# Patient Record
Sex: Male | Born: 1940 | Race: White | Hispanic: No | Smoking: Current every day smoker
Health system: Southern US, Community
[De-identification: ages and names within clinical notes are randomized; demographics above are authoritative.]

## PROBLEM LIST (undated history)

## (undated) DIAGNOSIS — I219 Acute myocardial infarction, unspecified: Secondary | ICD-10-CM

## (undated) DIAGNOSIS — E785 Hyperlipidemia, unspecified: Secondary | ICD-10-CM

## (undated) DIAGNOSIS — I249 Acute ischemic heart disease, unspecified: Secondary | ICD-10-CM

## (undated) DIAGNOSIS — Q600 Renal agenesis, unilateral: Secondary | ICD-10-CM

## (undated) DIAGNOSIS — Z720 Tobacco use: Secondary | ICD-10-CM

## (undated) DIAGNOSIS — K219 Gastro-esophageal reflux disease without esophagitis: Secondary | ICD-10-CM

## (undated) DIAGNOSIS — N183 Chronic kidney disease, stage 3 (moderate): Secondary | ICD-10-CM

## (undated) DIAGNOSIS — K279 Peptic ulcer, site unspecified, unspecified as acute or chronic, without hemorrhage or perforation: Secondary | ICD-10-CM

## (undated) DIAGNOSIS — I2510 Atherosclerotic heart disease of native coronary artery without angina pectoris: Secondary | ICD-10-CM

## (undated) DIAGNOSIS — I213 ST elevation (STEMI) myocardial infarction of unspecified site: Secondary | ICD-10-CM

## (undated) DIAGNOSIS — I10 Essential (primary) hypertension: Secondary | ICD-10-CM

## (undated) HISTORY — PX: CORONARY ARTERY BYPASS GRAFT: SHX141

## (undated) HISTORY — PX: OTHER SURGICAL HISTORY: SHX169

## (undated) HISTORY — DX: Peptic ulcer, site unspecified, unspecified as acute or chronic, without hemorrhage or perforation: K27.9

## (undated) HISTORY — PX: CORONARY ANGIOPLASTY WITH STENT PLACEMENT: SHX49

## (undated) HISTORY — PX: VASCULAR SURGERY: SHX849

## (undated) HISTORY — DX: Gastro-esophageal reflux disease without esophagitis: K21.9

---

## 1999-03-06 ENCOUNTER — Encounter: Payer: Self-pay | Admitting: Emergency Medicine

## 1999-03-07 ENCOUNTER — Inpatient Hospital Stay (HOSPITAL_COMMUNITY): Admission: EM | Admit: 1999-03-07 | Discharge: 1999-03-07 | Payer: Self-pay | Admitting: Emergency Medicine

## 1999-03-07 ENCOUNTER — Inpatient Hospital Stay (HOSPITAL_COMMUNITY): Admission: EM | Admit: 1999-03-07 | Discharge: 1999-03-21 | Payer: Self-pay | Admitting: Emergency Medicine

## 1999-03-07 ENCOUNTER — Encounter: Payer: Self-pay | Admitting: Cardiology

## 1999-03-08 ENCOUNTER — Encounter: Payer: Self-pay | Admitting: Cardiology

## 1999-03-08 ENCOUNTER — Encounter: Payer: Self-pay | Admitting: Emergency Medicine

## 1999-03-14 ENCOUNTER — Encounter: Payer: Self-pay | Admitting: Cardiovascular Disease

## 1999-03-16 ENCOUNTER — Encounter: Payer: Self-pay | Admitting: Cardiothoracic Surgery

## 1999-03-17 ENCOUNTER — Encounter: Payer: Self-pay | Admitting: Cardiothoracic Surgery

## 1999-03-18 ENCOUNTER — Encounter: Payer: Self-pay | Admitting: Cardiovascular Disease

## 1999-03-18 ENCOUNTER — Encounter: Payer: Self-pay | Admitting: Cardiothoracic Surgery

## 1999-07-18 ENCOUNTER — Encounter (HOSPITAL_COMMUNITY): Admission: RE | Admit: 1999-07-18 | Discharge: 1999-10-16 | Payer: Self-pay | Admitting: Cardiovascular Disease

## 1999-10-17 ENCOUNTER — Encounter (HOSPITAL_COMMUNITY): Admission: RE | Admit: 1999-10-17 | Discharge: 1999-10-18 | Payer: Self-pay | Admitting: Cardiovascular Disease

## 2004-12-18 ENCOUNTER — Encounter: Admission: RE | Admit: 2004-12-18 | Discharge: 2004-12-18 | Payer: Self-pay | Admitting: Cardiovascular Disease

## 2004-12-19 ENCOUNTER — Ambulatory Visit (HOSPITAL_COMMUNITY): Admission: RE | Admit: 2004-12-19 | Discharge: 2004-12-20 | Payer: Self-pay | Admitting: Cardiovascular Disease

## 2005-01-08 ENCOUNTER — Encounter (HOSPITAL_COMMUNITY): Admission: RE | Admit: 2005-01-08 | Discharge: 2005-04-08 | Payer: Self-pay | Admitting: Cardiovascular Disease

## 2006-06-10 ENCOUNTER — Inpatient Hospital Stay (HOSPITAL_COMMUNITY): Admission: EM | Admit: 2006-06-10 | Discharge: 2006-06-13 | Payer: Self-pay | Admitting: Emergency Medicine

## 2006-10-03 ENCOUNTER — Inpatient Hospital Stay (HOSPITAL_COMMUNITY): Admission: AD | Admit: 2006-10-03 | Discharge: 2006-10-07 | Payer: Self-pay | Admitting: Cardiology

## 2007-07-08 ENCOUNTER — Ambulatory Visit (HOSPITAL_COMMUNITY): Admission: RE | Admit: 2007-07-08 | Discharge: 2007-07-08 | Payer: Self-pay | Admitting: Cardiovascular Disease

## 2007-08-12 ENCOUNTER — Inpatient Hospital Stay (HOSPITAL_COMMUNITY): Admission: RE | Admit: 2007-08-12 | Discharge: 2007-08-13 | Payer: Self-pay | Admitting: *Deleted

## 2007-08-12 ENCOUNTER — Ambulatory Visit: Payer: Self-pay | Admitting: *Deleted

## 2007-08-12 ENCOUNTER — Encounter (INDEPENDENT_AMBULATORY_CARE_PROVIDER_SITE_OTHER): Payer: Self-pay | Admitting: *Deleted

## 2007-08-14 ENCOUNTER — Encounter: Admission: RE | Admit: 2007-08-14 | Discharge: 2007-08-14 | Payer: Self-pay | Admitting: *Deleted

## 2007-08-28 ENCOUNTER — Ambulatory Visit: Payer: Self-pay | Admitting: *Deleted

## 2008-07-27 ENCOUNTER — Inpatient Hospital Stay (HOSPITAL_COMMUNITY): Admission: EM | Admit: 2008-07-27 | Discharge: 2008-07-28 | Payer: Self-pay | Admitting: Emergency Medicine

## 2009-10-06 ENCOUNTER — Inpatient Hospital Stay (HOSPITAL_COMMUNITY): Admission: EM | Admit: 2009-10-06 | Discharge: 2009-10-07 | Payer: Self-pay | Admitting: Emergency Medicine

## 2011-01-30 LAB — BASIC METABOLIC PANEL
BUN: 16 mg/dL (ref 6–23)
BUN: 19 mg/dL (ref 6–23)
CO2: 23 mEq/L (ref 19–32)
CO2: 25 mEq/L (ref 19–32)
Calcium: 8.7 mg/dL (ref 8.4–10.5)
Creatinine, Ser: 1.56 mg/dL — ABNORMAL HIGH (ref 0.4–1.5)
GFR calc Af Amer: 54 mL/min — ABNORMAL LOW (ref >60)
GFR calc non Af Amer: 45 mL/min — ABNORMAL LOW (ref >60)
Glucose, Bld: 87 mg/dL (ref 70–99)

## 2011-01-30 LAB — COMPREHENSIVE METABOLIC PANEL
ALT: 12 U/L (ref 0–53)
Alkaline Phosphatase: 55 U/L (ref 39–117)
Chloride: 111 mEq/L (ref 96–112)
GFR calc Af Amer: >60 mL/min (ref >60)
GFR calc non Af Amer: 50 mL/min — ABNORMAL LOW (ref >60)
Sodium: 140 mEq/L (ref 135–145)

## 2011-01-30 LAB — CBC
HCT: 39.6 % (ref 39.0–52.0)
HCT: 39.9 % (ref 39.0–52.0)
HCT: 43.3 % (ref 39.0–52.0)
Hemoglobin: 14.9 g/dL (ref 13.0–17.0)
MCHC: 34.4 g/dL (ref 30.0–36.0)
MCHC: 34.5 g/dL (ref 30.0–36.0)
MCHC: 34.7 g/dL (ref 30.0–36.0)
MCV: 93 fL (ref 78.0–100.0)
Platelets: 173 10*3/uL (ref 150–400)
Platelets: 193 10*3/uL (ref 150–400)
RBC: 4.66 MIL/uL (ref 4.22–5.81)
RDW: 13.6 % (ref 11.5–15.5)
RDW: 13.6 % (ref 11.5–15.5)
WBC: 6.3 10*3/uL (ref 4.0–10.5)

## 2011-01-30 LAB — CARDIAC PANEL(CRET KIN+CKTOT+MB+TROPI)
CK, MB: 3.9 ng/mL (ref 0.3–4.0)
CK, MB: 5.4 ng/mL — ABNORMAL HIGH (ref 0.3–4.0)
Total CK: 105 U/L (ref 7–232)
Troponin I: 0.02 ng/mL (ref 0.00–0.06)

## 2011-01-30 LAB — POCT CARDIAC MARKERS
Myoglobin, poc: 190 ng/mL (ref 12–200)
Troponin i, poc: <0.05 ng/mL (ref 0.00–0.09)

## 2011-01-30 LAB — HEPARIN LEVEL (UNFRACTIONATED): Heparin Unfractionated: 0.28 IU/mL — ABNORMAL LOW (ref 0.30–0.70)

## 2011-01-30 LAB — APTT: aPTT: 28 seconds (ref 24–37)

## 2011-01-30 LAB — CK TOTAL AND CKMB (NOT AT ARMC): CK, MB: 7 ng/mL — ABNORMAL HIGH (ref 0.3–4.0)

## 2011-01-30 LAB — TROPONIN I: Troponin I: 0.02 ng/mL (ref 0.00–0.06)

## 2011-01-30 LAB — MAGNESIUM: Magnesium: 2.1 mg/dL (ref 1.5–2.5)

## 2011-01-30 LAB — LIPID PANEL
HDL: 40 mg/dL (ref >39)
LDL Cholesterol: 115 mg/dL — ABNORMAL HIGH (ref 0–99)

## 2011-01-30 LAB — DIFFERENTIAL
Basophils Absolute: 0 10*3/uL (ref 0.0–0.1)
Eosinophils Relative: 2 % (ref 0–5)
Neutrophils Relative %: 61 % (ref 43–77)

## 2011-01-30 LAB — PROTIME-INR: Prothrombin Time: 12.9 seconds (ref 11.6–15.2)

## 2011-03-13 NOTE — Op Note (Signed)
NAME:  Christopher Wilson, Christopher Wilson                ACCOUNT NO.:  [id number]   MEDICAL RECORD NO.:  [id number]          PATIENT TYPE:  INP   LOCATION:  3301                         FACILITY:  [facility] [place]   PHYSICIAN:  Dorothea Glassman, M.D.    DATE OF BIRTH:  1941/06/05   DATE OF PROCEDURE:  08/12/2007  DATE OF DISCHARGE:                               OPERATIVE REPORT   SURGEON:  Dorothea Glassman, M.D.   ASSISTANT:  RNFA.   ANESTHETIC:  General endotracheal.   ANESTHESIOLOGIST:  Dr. Glennon Mac.   PREOPERATIVE DIAGNOSIS:  Severe left internal carotid artery stenosis.   POSTOPERATIVE DIAGNOSIS:  Severe left internal carotid artery stenosis.   PROCEDURE:  Left carotid endarterectomy with Dacron patch angioplasty.   OPERATIVE PROCEDURE:  The patient was brought to the operating room in  stable condition.  Placed in supine position.  General endotracheal  anesthesia induced.  Foley catheter and arterial line in place.  Left  neck prepped and draped in a sterile fashion.   Curvilinear skin incision made along the anterior border of the left  sternomastoid muscle.  Dissection carried down through the subcutaneous  tissue with electrocautery.  Deep dissection carried through the  platysma.  The facial vein ligated with 3-0 silk and divided.  Several  branching veins were ligated off of the left internal jugular vein with  3-0 silk and divided.  The left carotid bifurcation exposed.  The common  carotid artery mobilized down to the omohyoid muscle and encircled with  a vessel loop.  The vagus nerve reflected posteriorly and preserved.  The internal carotid artery followed distally up to the posterior belly  of the digastric muscle.  The hypoglossal nerve identified, retracted  superiorly and preserved.  The distal internal carotid artery encircled  with a vessel loop.  The external carotid and superior thyroid were then  freed and encircled with vessel loops.  The patient administered 7000  units heparin  intravenously.   Gentle palpation of the carotid bifurcation revealed soft plaque  extending up into the left internal carotid artery origin.  Beyond this  the vessel was soft without plaque.   The carotid vessels controlled with clamps.  Longitudinal arteriotomy  made in the distal common carotid artery.  The arteriotomy extended  across the carotid bulb and up into the internal carotid artery.  There  was a large amount of ulcerated plaque present at the origin of the  internal carotid artery with thrombus present in the hemorrhagic plaque.  The shunt was inserted.   An endarterectomy elevator used to remove the plaque.  The  endarterectomy carried down to the common carotid artery with plaque  which was divided transversely with Potts scissors.  Plaque then raised  up into the bulb where the superior thyroid and external carotid were  endarterectomized using an eversion technique.  The distal internal  carotid artery plaque feathered out well.  Fragments of plaque removed  with fine forceps.  The site irrigated with heparin, saline and dextran  solutions.   A patch angioplasty endarterectomy site was then carried out with a  running  6-0 Prolene suture using a finesse Dacron patch.  At completion  of the patch angioplasty the shunt was removed, all vessels well  flushed.  Initial antegrade flow then directed up the external carotid  artery, internal carotid artery then released.   The patient administered 50 mg of protamine intravenously.  The patch  treated with CoSeal.   Excellent Doppler signal present in the distal internal carotid artery.   The left neck was drained with a 15 round Blake drain, exited inferiorly  through the skin, fixed to the skin with a 2-0 silk suture.   The sternomastoid fascia was then closed using running 2-0 Vicryl  suture.  Platysma closed with running 3-0 Vicryl suture.  Skin closed  with 4-0 Monocryl.  Dermabond applied.  Sterile dressing  applied.  The  patient tolerated the procedure well.  No apparent complications.  Transferred to the recovery room in stable condition.      Dorothea Glassman, M.D.  Electronically Signed     PGH/MEDQ  D:  08/12/2007  T:  08/13/2007  Job:  [id number]

## 2011-03-13 NOTE — H&P (Signed)
NAME:  Christopher Wilson, Christopher Wilson                ACCOUNT NO.:  [id number]   MEDICAL RECORD NO.:  [id number]          PATIENT TYPE:  INP   LOCATION:  2550                         FACILITY:  [place]   PHYSICIAN:  Dorothea Glassman, M.D.    DATE OF BIRTH:  [birth year]   DATE OF ADMISSION:  08/12/2007  DATE OF DISCHARGE:                              HISTORY & PHYSICAL   CARDIOLOGIST:  Richard A. Rollene Fare, M.D.   PRIMARY CARE PHYSICIAN:  Tory Emerald. Benson Norway, MD.   INDICATION FOR ADMISSION:  Severe left internal carotid artery stenosis.   HISTORY.:  [name] is a 70 year old gentleman with a long history of  atherosclerotic vascular disease.  He was recently noted to have an  abnormal Doppler evaluation.  Underwent arteriography carried out by Dr.  Gwenlyn Found indicating a severe left internal carotid artery stenosis.  He was  initially seen and evaluated for potential carotid stenting.  He,  however, was not felt to meet criteria for carotid stenting and,  therefore, is brought to the operating at this time for planned left  carotid endarterectomy.  There is no history of documented stroke.  He  denies sensory, motor or visual deficit.   PAST MEDICAL HISTORY:  1. Coronary artery disease status post remote PCI at [facility] in 1989.  He      underwent PCI in 1999, and he underwent coronary bypass procedure      in May 2002 carried out by Dr. Prescott Gum.  2. Tobacco abuse.  3. Dyslipidemia.  4. Hypertension.  5. Ischemic cardiomyopathy.   MEDICATIONS:  1. Aspirin 81 mg 2 tablets daily.  2. Plavix 75 mg daily.  3. Lisinopril 20 mg daily.  4. Lopressor 25 mg b.i.d.  5. Fish oil 1000 mg daily.  6. Ranexa 500 mg b.i.d.  7. Zocor 80 mg daily.  8. Multivitamin 1 tablet daily.   ALLERGIES:  None known.   SOCIAL HISTORY:  The patient is divorced.  He has two children.  He is  retired Haematologist truck.  Continues to smoke approximately one-half pack  of cigarettes daily.   REVIEW OF SYSTEMS:  Denies recent chest  pain.  No shortness of breath.  No syncope.  No abdominal pain.  Bowel habits regular.  No recent weight  loss.   FAMILY HISTORY:  Noncontributory.   PHYSICAL EXAMINATION:  GENERAL:  Well-appearing 70 year old gentleman.  Alert and oriented.  No acute distress.  VITAL SIGNS:  Blood pressure[ 120/80, pulse 69 per minute, respirations  18 per minute.  HEENT:  Mouth and throat clear.  Normocephalic.  Extraocular movements  intact.  NECK:  Supple.  No thyromegaly or adenopathy.  CHEST:  Equal air entry bilaterally.  No rales or rhonchi.  CARDIOVASCULAR:  Normal heart sounds.  No murmurs are gallops.  Left  carotid bruit.  ABDOMEN:  Soft, nontender.  No mass or organomegaly.  EXTREMITIES:  Lower Extremities: 2+ femoral pulses bilaterally.  No  ankle edema.  NEUROLOGIC:  Cranial nerves intact.  Strength equal bilaterally, 2+  reflexes.   IMPRESSION:  1. Severe left internal carotid artery stenosis.  2.  Coronary artery disease.  3. Hyperlipidemia.  4. Hypertension.  5. Tobacco abuse.   MEDICAL DECISION-MAKING:  The patient has a high-grade left internal  carotid artery stenosis verified by Doppler and arteriography.  Scheduled to undergo left carotid endarterectomy for reduction of stroke  risk.  Risks of the operative procedure explained to the patient in  detail with the major one being  mortality at 1-2% to include but not  limited to MI, CVA, cranial nerve injury and death.  Scheduled surgery  on September 04, 2007.      Dorothea Glassman, M.D.  Electronically Signed     PGH/MEDQ  D:  09-04-07  T:  09/04/07  Job:  [id number]   cc:   Delfino Lovett A. Rollene Fare, M.D.  Tory Emerald Benson Norway, MD

## 2011-03-13 NOTE — Cardiovascular Report (Signed)
NAME:  Christopher Wilson, Dean                ACCOUNT NO.:  [id number]   MEDICAL RECORD NO.:  [id number]          PATIENT TYPE:  AMB   LOCATION:  SDS                          FACILITY:  [place]   PHYSICIAN:  Quay Burow, M.D.   DATE OF BIRTH:  18-Sep-1941   DATE OF PROCEDURE:  07/08/2007  DATE OF DISCHARGE:  07/08/2007                            CARDIAC CATHETERIZATION   [name] is a 70 year old gentleman with a long history of ischemic  heart disease status post bypass grafting in the past by Dr. Prescott Gum  in 2002 with multiple interventions since.  He has multiple vascular  risk factors and had surveillance Dopplers recently that showed high-  grade left ICA stenosis though he was neurologically asymptomatic.  He  has mild ischemic cardiomyopathy with hyperlipidemia and ongoing tobacco  abuse.  He presents now for cerebral angiography to define his anatomy  and suitability for endarterectomy versus carotid artery stenting.   DESCRIPTION OF PROCEDURE:  The patient was brought to the second floor  [facility] angiographic suite in the postabsorptive state.  He was  not premedicated.  His right groin was prepped and shaved in the usual  sterile fashion.  Xylocaine 1% was used for local anesthesia.  A 5-  French sheath was inserted into the right femoral artery using standard  Seldinger technique.  A 5-French pigtail catheter along with the JV1  catheter were used for arch angiography, selective cerebral angiography  of all four vessels intra- and extracranially.  Visipaque dye was used  for the entirety of the case.  Aortic pressures monitored during the  case.   ANGIOGRAPHIC RESULTS:  1. Arch aortogram:  Bovine arch, type 2-3.  2. Right carotid:  40-50% mildly calcified proximal right ICA      stenosis.  The right carotid filled the anterior cerebrals      bilaterally.  3. Right vertebral:  Large dominant vessel filling the basilar and      both posterior cerebrals.  4. Left carotid:   This arose from the innominate vessel in a bovine      fashion.  There is a 95% proximal eccentric stenosis.  The left      anterior cerebral was filled by the right circulation.  5. Left vertebral:  This was nondominant.   IMPRESSION:  [name] has high-grade left internal carotid artery  stenosis with moderate right disease, and is neurologically  asymptomatic.  He is at mildly elevated risk because of his ischemic  heart disease and mild left ventricular dysfunction.  I am going to get  Dr. Drucie Opitz to see him to evaluate suitability for endarterectomy  versus carotid artery stenting.   The sheath was removed and pressure was held on the groin to achieve  hemostasis.  The patient left the lab in stable condition.  He will be  discharged home later today and will follow up with Dr. Rollene Fare.      Quay Burow, M.D.  Electronically Signed     JB/MEDQ  D:  07/08/2007  T:  07/08/2007  Job:  [id number]   cc:  2nd floor [facility] angio suite  [facility]. Rollene Fare, M.D.

## 2011-03-13 NOTE — Cardiovascular Report (Signed)
NAME:  [name], Corderro                ACCOUNT NO.:  [id number]   MEDICAL RECORD NO.:  [id number]          PATIENT TYPE:  INP   LOCATION:  2807                         FACILITY:  [place]   PHYSICIAN:  Richard A. Rollene Fare, M.D.DATE OF BIRTH:  1941/01/24   DATE OF PROCEDURE:  07/27/2008  DATE OF DISCHARGE:                            CARDIAC CATHETERIZATION   PROCEDURES:  Retrograde central aortic catheterization, selective  coronary angiography via Judkins technique, saphenous vein graft  angiography, selective left internal mammary artery angiogram, left  ventricular angiogram right anterior oblique-left anterior oblique  projection, aortic arch angiogram left anterior oblique-right anterior  oblique projection, and abdominal aortic angiogram midstream pulmonary  artery projection.   PROCEDURE IN DETAIL:  The patient was brought to second floor [facility] Lab in  a postabsorptive state after premedication with 5 mg of Valium p.o.  Right groin was prepped and draped in the usual manner.  Xylocaine 1%  was used for local anesthesia.  The CRFA was entered with a single  anterior puncture using 18 thin-walled needle and with modified  Seldinger technique a 6-French short sidearm sheath was inserted without  difficulty.  Diagnostic coronary angiography was done with 6-French 4-cm  taper preformed Cordis coronary catheters.  Saphenous vein graft  angiography to the RCA was done with the right coronary catheter.  The  left circumflex SVG was totally flush occluded on prior studies and on  this study.  The LIMA was catheterized with a 6-French LIMA catheter  subselectively with angiograms obtained.  Catheter was then changed  using guidewire exchange for a 6-French pigtail catheter which we used  for LV angiogram in RAO and LAO projection at 20 mL/14 mL per second for  each projection.  Omnipaque dye was used throughout the procedure.  Pullback pressure of the CA was performed which showed no gradient  across the aortic valve.  Aortic arch angiogram was done in the LAO and  RAO projection at 25 mL/20 mL per second.  Catheter was pulled down  above the level of the left renal artery and abdominal angiogram was  done at 25 mL/20 mL per second in the midstream PA projection.  This  demonstrated a single left renal artery and a large left kidney with  good nephrogram.  The renal artery was widely patent.  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  The infrarenal  abdominal aorta demonstrated irregularity and approximately 40%  narrowing in the mid portion of the infrarenal abdominal aorta.  The IMA  was intact.  The SMA and celiac were intact proximally.  There was  moderate nonobstructive but calcific bilateral common iliac disease with  about 60% on the right and 40% on the left.  The external iliacs had no  significant stenosis and the hypogastrics were intact bilaterally.  Hand  injection in the oblique projection of the right common femoral artery  showed good puncture into the RCFA with the profunda intact.   Catheters removed.  Side-arm sheath was flushed.  StarClose nitinol clip  device was deployed, 6-French with successful closure of the right  common femoral artery and the patient was transferred to the holding  area for postoperative care in stable condition.   Pressures:  LV:  165/8-10.  LVEDP 18-22 mmHg.   CA:  165/85 mmHg.   There is no gradient across the aortic valve on catheter pullback.   The main left coronary artery had a previously placed stent (TAXUS  stent, February 2006) that was widely patent with less than 10%  narrowing and excellent flow.   The LAD was totally occluded after a moderate-sized first diagonal  branch and septal perforator branch.   The first diagonal branch had an ostial and proximal TAXUS DES stent  placed, February 2006 that was widely patent except for about 50%  narrowing in the proximal third of the stent,  but there was good flow to  a bifurcating moderated-sized DX1.  The LAD was totally occluded after  the septal perforator in DX1 with no antegrade filling.   The circumflex was occluded after a small first marginal branch with no  antegrade filling.   The right coronary artery was totally occluded in its ostial and  proximal portion with only a faint conus branch visualized.   Saphenous vein graft to the circumflex demonstrated flush occlusion with  no filling on attempts at selective angiography and aortic root  injection.   Saphenous vein graft to the RCA was a large widely patent smooth graft  with an excellent anastomosis to the mid PDA.  Several branches were  visualized beyond the anastomosis which were widely patent and there was  excellent retrograde filling to the PLA with 2 large branches visualized  and good filling.  There was faint retrograde collaterals to small OM  branches from the distal RCA.   Aortic arch angiogram in the RAO and LAO projection showed no evidence  of aortic dissection.  No other grafts visualized except for the graft  to the right that was selectively catheterized.  Normal origin of the  great vessels from the arch.  Proximal descending aorta showed no  angiographic evidence of dissection either.   LV angiogram demonstrated hypokinesis of the mid anterolateral wall.  Hypokinesis of the basilar half of the inferior wall, akinesis of the  one-third to one-half of the inferoapical segment, and hypo-akinesis of  the anteroapical and posteroapical segments in the LAO projection.  No  significant mitral regurgitation.  EF was approximately 35-40% and  quantitative assessment is pending.   The LIMA was widely patent.  There was 70% proximal left vertebral  stenosis in the LIMA.  It was an end-to-end anastomosis to the mid-LAD  filling the LAD down to the apex.  The LAD was relatively small but good  filling regular with no high-grade stenosis beyond  the anastomosis.   DISCUSSION:  This 70 year old white divorced father of two with one  grandchild has a long history of coronary disease.  He had remote CABG  in 1989 at [facility].  He had bare-metal stenting of the RCA in 1999.  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 Tharon Aquas Trigt  successfully.  He had progression of disease and required protected left  main coronary stenting and first diagonal stenting in February 2006 with  TAXUS stents that are widely patent on this study.   He has had previous ostial and proximal TAXUS stenting of the SVG to the  circumflex by Dr. Melvern Banker, August 2007, but they were  occluded on  restudy, December 2007, probably prehospitalization accounting for a  chest pain on admission at that time.   He has done well on medical therapy.  He does continue to smoke, but he  has been compliant with his medications which include chronic Plavix and  aspirin.  He was admitted on this occasion with 1 week of episodic  interscapular discomfort, moderate episode in the a.m. of admission.  Myocardial infarction was ruled out thus far by serial enzymes and EKGs.  Angiography shows widely patent LIMA to the LAD, widely patent SVG to  the PDA with good collaterals to the PLA, and known old total occlusion  of the SVG to the circumflex.  He does have LV dysfunction which is also  known without overt CHF.   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  Etiology of the patient's chest  pain is not clear.  It maybe for medial collaterals to his circumflex if  it is indeed coronary.  There is no evidence of aortic root arch  dissection.  A GI etiology is possible as well.  I would recommend  continued medical therapy.  If he has recurrent chest pain, we can add  Ranexa to his regimen.  I would continue long-term aspirin and Plavix at   present and gave him empiric PPIs.   Also of note, is the fact that he has had asymptomatic left carotid  endarterectomy by Dr. Amedeo Plenty in October [id number] that was uncomplicated.   CATHETERIZATION DIAGNOSES:  1. Arteriosclerotic heart disease - status post coronary artery bypass      graft at [facility] in 1989.  2. Redo coronary artery bypass graft for progression of disease x3 by      Dr. Tharon Aquas Trigt in 2003.  3. Remote native right coronary artery stent in 1999, nondrug-eluting      stent.  4. Protected left main coronary drug-eluting stent and diagonal branch      1 ostial and proximal drug-eluting stent, February 2006, both      widely patent on this study by Dr. Rollene Fare.  5. Ostial and proximal saphenous vein graft to obtuse marginal TAXUS      stent x2, August 2007, Dr. Melvern Banker, occluded on repeat angiogram in      December 2007 and on current angiogram.  6. Patent saphenous vein graft to posterior descending artery with      good collaterals to posterolateral artery and some faint      collaterals to mid circumflex.  7. Patent left internal mammary artery to left anterior descending.  8. Left carotid endarterectomy for asymptomatic carotid stenosis,      October [id number], uncomplicated.  9. 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  10.Single kidney, absent right kidney, and widely patent single left      renal artery.  11.Hypertension.  12.Continued cigarette abuse.  13.Hyperlipidemia.  14.Possible gastroesophageal reflux disease and upper gastrointestinal      etiology.  15.No angiographic evidence of aortic root or arch dissection on this      study.      Richard A. Rollene Fare, M.D.  Electronically Signed     RAW/MEDQ  D:  07/27/2008  T:  07/28/2008  Job:  [id number]   cc:   CP Lab  Record Room

## 2011-03-13 NOTE — Assessment & Plan Note (Signed)
OFFICE VISIT   Christopher Wilson, Karina O  DOB:  January 01, 1941                                       08/28/2007  [id number]   [name] underwent left carotid endarterectomy for severe stenosis  August 12, 2007 at [facility].  This was carried out on Plavix  and aspirin secondary to multiple coronary stents.   He has no complaints at this time.   BP 179/95 in the left arm, 196/108 in the right arm, pulse is 18 per  minute, O2 saturation 100%.  Left neck incision healing unremarkably  with mild to moderate swelling.  No bruit audible.  Cranial nerves  intact.  Strength equal bilaterally.   [name] has done well following his left carotid endarterectomy.  We  will plan follow up with him again in 6 months with a routine carotid  Doppler evaluation.   Dorothea Glassman, M.D.  Electronically Signed   PGH/MEDQ  D:  08/28/2007  T:  08/29/2007  Job:  [id number]   cc:   Richard A. Rollene Fare, M.D.

## 2011-03-13 NOTE — Discharge Summary (Signed)
NAME:  Christopher Wilson, Christopher Wilson                ACCOUNT NO.:  [id number]   MEDICAL RECORD NO.:  [id number]          PATIENT TYPE:  INP   LOCATION:  3301                         FACILITY:  [place]   PHYSICIAN:  Dorothea Glassman, M.D.    DATE OF BIRTH:  1941/03/31   DATE OF ADMISSION:  08/12/2007  DATE OF DISCHARGE:  08/13/2007                               DISCHARGE SUMMARY   ADMISSION DIAGNOSIS:  Severe left internal carotid artery stenosis,  asymptomatic.   DISCHARGE/SECONDARY DIAGNOSES:  1. Severe left internal carotid artery stenosis, asymptomatic, status      post left carotid endarterectomy.  2. History of coronary artery disease, status post remote PCI at [facility]      in 1989, PCI in 1999 and coronary artery bypass grafting in May      2002 by Dr. Tharon Aquas Trigt.  3. Tobacco abuse.  4. Dyslipidemia.  5. Hypertension.  6. Ischemic cardiomyopathy.  7. No known drug allergies.  8. History of C. difficile colitis in 2007.   PROCEDURES:  August 12, 2007 - left carotid endarterectomy with Dacron  patch angioplasty by Dr. Gordy Clement.   BRIEF HISTORY:  [name] is a 70 year old Caucasian male with long  history of atherosclerotic vascular disease.  He was recently noted to  have an abnormal Doppler evaluation.  He underwent arteriography carried  out by Dr. Gwenlyn Found indicating severe left internal carotid artery  stenosis.  His was initially seen and evaluated for potential carotid  stenting.  He, however, was not felt to meet criteria for carotid  stenting and therefore was referred to Dr. Gordy Clement for elective  left carotid endarterectomy.  [name] had no history of TIA or  stroke.   HOSPITAL COURSE:  [name] was electively admitted to [facility] on August 12, 2007 and underwent left carotid endarterectomy.  He is felt to be neurologically intact and after short-stay in recovery  unit was transferred to step-down unit 3300 where he remained until  discharge.  A  Jackson-Pratt drain was intraoperatively, as he had been  on Plavix preoperatively.  This, however, had minimal output on  postoperative day 1 and was discontinued.  The vitals remained stable,  blood pressure 110/43.  He was in sinus rhythm in the 60s.  He was  afebrile.  Initially used supplemental oxygen but was weaned by morning.  He was able to void following Foley catheter removal.  Postoperative  labs showed a white blood count of 6.6, hemoglobin 12.7, hematocrit 37,  platelet count 164.  Sodium 137, potassium 4.1, BUN 19, creatinine 1.59  (baseline creatinine 1.4), blood glucose 121.  Physical exam showed he  was alert and oriented.  Heart was irregular rate and rhythm.  Lungs  were clear.  Abdominal exam benign.  Neurologically, he remained intact.  Tongue was midline.  He did have some soft tissue edema around his left  neck, but no evidence of hematoma and was not having problems with  swallowing or breathing.  The pain was controlled on oral medication.  His diet was advanced and he  was mobilized that morning.  By late  morning, he was felt to meet criteria for discharge home and was  discharged in stable condition.   DISCHARGE MEDICATIONS:  1. Ultram 50 mg 1 tablet p.o. q.4-6h. p.r.n. pain.  2. Aspirin 81 mg daily.  3. Plavix 75 mg daily.  4. Lisinopril 20 mg daily.  5. Lopressor 25 mg b.i.d.  6. Fish oil capsule 1000 mg daily.  7. Ranexa 500 mg b.i.d.  8. Zocor 80 mg p.o. daily.  9. Vitamin 1daily.   DISCHARGE INSTRUCTIONS:  He should continue heart healthy diet.  Increase activity slowly.  May shower starting October 16, and clean his  incision gently with soap and water.  Should call if he develops fever  greater than 101, or redness or drainage from his incision site.  He  will see Dr.  Amedeo Plenty in approximately 2 weeks.  Our office will contact  him regarding specific appointment date and time.      Jacinta Shoe, P.A.      Dorothea Glassman, M.D.   Electronically Signed    AWZ/MEDQ  D:  08/13/2007  T:  08/14/2007  Job:  [id number]   cc:   Dorothea Glassman, M.D.  Quay Burow, M.D.  Richard A. Rollene Fare, M.D.  Tory Emerald Benson Norway, MD

## 2011-03-13 NOTE — Discharge Summary (Signed)
NAME:  Christopher Wilson, Christopher Wilson                ACCOUNT NO.:  [id number]   MEDICAL RECORD NO.:  [id number]          PATIENT TYPE:  INP   LOCATION:  4703                         FACILITY:  [place]   PHYSICIAN:  Richard A. Rollene Fare, M.D.DATE OF BIRTH:  Oct 11, 1941   DATE OF ADMISSION:  07/27/2008  DATE OF DISCHARGE:  07/28/2008                               DISCHARGE SUMMARY   DISCHARGE DIAGNOSES:  1. Chest pain, no clear etiology, status post cardiac catheterization      without any obstructive disease.  He has known coronary artery      disease status post redo coronary artery bypass grafting x3 by Dr.      Tharon Aquas Trigt in 2003.  His catheterization this admission showed      his left internal mammary artery (graft) to his left anterior      descending was patent, saphenous vein graft to right coronary      artery was patent, saphenous vein graft to his circumflex was      occluded which is old.  He had collaterals from his right to his      circumflex territory, possibility that he may have some angina from      medial collaterals to the circumflex if it is coronary related at      all.  2. Coronary artery disease with coronary artery bypass grafting at      [facility] in 1989, redo in 2003, right coronary artery stent in 1999,      protected left main drug-eluting stent in diagonal branch ostial 1      and proximal drug-eluting stent February 2006, both widely patent      this cardiac catheterization.  Ostial and proximal saphenous vein      graft to his obtuse marginal Taxus stent x2 August 2007 which was      occluded on repeat angiogram in December 2007.  3. ASPVD with history of left carotid endarterectomy for asymptomatic      carotid stenosis, October 2008.  He has a 70% asymptomatic left      vertebral stenosis.  4. Congenital single kidney, absent right kidney, widely patent single      left renal artery.  5. Hypertension.  6. Tobacco abuse.  7. Hyperlipidemia.  8. Possible  gastroesophageal reflux disease.  9. Ischemic cardiomyopathy with an ejection fraction of 35-40%.   LABORATORY DATA:  Lipid profile, total cholesterol 180, triglycerides  88, HDL 37, LDL 125.  Sodium 135, potassium 4.9, chloride 107, CO2 24,  BUN 12, creatinine 1.15, hemoglobin 15.9, hematocrit 46.6, WBC 7.8 and  platelets 217.  Point-of-care marker #1, CK-MB 9.7, troponin less than  0.05.  point-of-care marker #2, CK-MB 7.9, troponin less than 0.05, CK-  MB 300/12.7, troponin 0.01, #2, 198/8.6, troponin of 0.01, #3, 151/6.6,  troponin of 0.01.  Chest x-ray, post surgical change of the chest  without evidence of acute pulmonary disease.  No change from prior chest  x-ray.   PROCEDURES:  Cardiac catheterization on July 27, 2008, by Dr.  Terance Ice with a patent LIMA and diagonal, the stent 2/06,  occluded SVG to his circumflex which is all patent, SVG to his PDA and a  patent LIMA to his LAD.  No dissection of the aorta was seen.   HOSPITAL COURSE:  [name] came to the emergency room after  experiencing some stuttering back scapular pain for 4 hours.  He did not  try nitroglycerin.  Apparently, it had been going on for approximately 1  week.  He came to the emergency room because that would not ease up.  He  then thought maybe it was his heart.  He had no diaphoresis, no  shortness of breath, no near-syncope, no lightheadedness, and no  radiation of the pain.  It was decided because of his history that he  would need to undergo cardiac catheterization.  He was seen by Dr.  Terance Ice, his primary cardiologist and he underwent cardiac  catheterization.  Findings were not anything new.  He may possibly have  some angina from some collaterals to his circumflex, although it may  also be just musculoskeletal or GI related.  His medications were  changed.  His LDL was still elevated on simvastatin 80.  He was willing  to try Crestor 20, amlodipine was also added 5 mg a  day.  He was put on  5 mg b.i.d. during this hospitalization because his lisinopril was hold,  however, this was restarted.  Thus his amlodipine was decreased to 5 mg  a day.  His blood pressure on the day of discharge was 131/69, heart  rate 59, and respirations 17.  His right groin was without any bruise.  He did have a [employer] procedure.  He was told not to sit in a tub of  water for 1 week.   DISCHARGE MEDICATIONS:  1. Lisinopril 20 mg b.i.d.  2. Aspirin 81 mg a day.  3. Nitroglycerin p.r.n.  4. Plavix 75 mg a day.  5. Fish oil as taken previously.  6. Metoprolol 25 mg twice per day.  7. Amlodipine 5 mg once a day.  8. Crestor 20 mg once a day.  9. We will also give him a call at home to take Pepcid over-the-      counter 20 mg twice a day.      Cyndia Bent, N.P.      Richard A. Rollene Fare, M.D.  Electronically Signed    BB/MEDQ  D:  07/28/2008  T:  07/29/2008  Job:  [id number]

## 2011-03-16 NOTE — Consult Note (Signed)
NAME:  Christopher Wilson, Christopher Wilson                ACCOUNT NO.:  [id number]   MEDICAL RECORD NO.:  [id number]          PATIENT TYPE:  INP   LOCATION:  2039                         FACILITY:  [place]   PHYSICIAN:  Tory Emerald. Benson Norway, MD    DATE OF BIRTH:  Jan 31, 1941   DATE OF CONSULTATION:  10/04/2006  DATE OF DISCHARGE:                                 CONSULTATION   REASON FOR CONSULTATION:  Scapular pain.   ADMITTING PHYSICIAN:  Chase Picket, M.D.   CARDIOLOGIST:  Richard A. Rollene Fare, M.D.   HISTORY OF PRESENT ILLNESS:  This is a 70 year old gentleman with a past  medical history of severe coronary artery disease status post bypass  surgery, hyperlipidemia, ischemic cardiomyopathy with an EF of 30-40%  and hypertension who is admitted to the hospital with complaints of  scapular pain.  The patient states that his symptoms started on Sunday  and he was evaluated in my office on Monday.  At that time he had not  named any complaints of any type of shortness of breath or other known  cardiac etiologies.  The patient complained of having diarrhea at that  time and he was scheduled to have a colonoscopy.  Subsequently he was  held off of Plavix after discussion with Dr. Rollene Fare in regards to the  colonoscopy which was planned for Friday.  The patient had a routine  echo evaluation on October 03, 2006 and during that time he expressed  some complaints of scapular pain.  He was subsequently sent to the  cardiology office and noted to have abnormal ST changes and subsequently  the patient was admitted for further evaluation.  The patient after  discussion again was determined that the patient had been treated with  clindamycin in October after a dental procedure.  At that time he was  also taken off of Plavix for approximately 3 days.  The patient did not  have any known complications at that time.  During the initial office  evaluation the patient had denied taking any types of antibiotics and  his  recollection for the medication was only at the time of this  admission.   PAST MEDICAL AND SURGICAL HISTORY:  As stated above.   FAMILY HISTORY:  Noncontributory.   SOCIAL HISTORY:  The patient is divorced.  He has two children and  grandchildren.  Smokes half pack per day.  No alcohol.   ALLERGIES:  NO KNOWN DRUG ALLERGIES.   MEDICATIONS:  Aspirin, Plavix, lisinopril, pravastatin, metoprolol,  multivitamin, Imdur.   REVIEW OF SYSTEMS:  Positive for the scapular pain.  No chest pain or  shortness of breath.  No dizziness, blurry vision.  No arthritis,  arthralgias.  No abdominal pain.  Positive for diarrhea.  No fevers or  chills.   PHYSICAL EXAMINATION:  VITAL SIGNS:  Stable.  GENERAL:  The patient is in no acute distress.  Alert and oriented.  HEENT:  Normocephalic, atraumatic.  Extraocular muscles intact.  Pupils  equal, round, reactive to light.  NECK:  Supple.  No lymphadenopathy.  CHEST:  Lungs are clear to auscultation bilaterally.  CARDIOVASCULAR:  Regular rate and rhythm.  ABDOMEN:  Flat, soft, nontender, nondistended.  EXTREMITIES:  No clubbing, cyanosis or edema.   LABORATORY VALUES:  White blood cell count 9.8, hemoglobin 14.3,  platelets at 306.  Sodium is 138, potassium 4.2, chloride 106, CO2 26,  BUN is 8, creatinine is 1.3, glucose is 116, AST 29, ALT 28, alk phos  75, total bili is 0.7, albumin is 3.5.   IMPRESSION:  1. Unstable angina  2. Diarrhea.  3. History ischemic cardiomyopathy.  4. Hyperlipidemia.  5. Tobacco abuse.   At this time the patient does not require colonoscopy.  His cardiac  issues are pressing at this time.  I agree with workup for C. diff  colitis and treatment if the assay is positive.  I will continue to  follow the patient at this time to determine the next course of action  from a GI standpoint.      Tory Emerald Benson Norway, MD  Electronically Signed     PDH/MEDQ  D:  10/07/2006  T:  10/07/2006  Job:  [id number]   cc:   Jeanella Craze. Little, M.D.  Richard A. Rollene Fare, M.D.

## 2011-03-16 NOTE — Cardiovascular Report (Signed)
NAME:  Christopher Wilson, Christopher Wilson                ACCOUNT NO.:  [id number]   MEDICAL RECORD NO.:  [id number]          PATIENT TYPE:  INP   LOCATION:  2039                         FACILITY:  [place]   PHYSICIAN:  Shelva Majestic, M.D.     DATE OF BIRTH:  [birth year]   DATE OF PROCEDURE:  DATE OF DISCHARGE:                            CARDIAC CATHETERIZATION   HISTORY OF PRESENT ILLNESS:  Christopher Wilson is a 70 year old  gentleman, who has known CAD, status post remote inferior wall  myocardial infarction with stenting of his RCA in 2000.  He also had  significant left main stenosis and ultimately underwent CBG re-  vascularization surgery with a LIMA to his LAD, vein graft to his  circumflex marginal vessel, and vein graft to his PDA.  He is also  status post subsequent left main cutting balloon arthrotomy/TES stenting  by Dr. Rollene Fare.  His last catheterization was done on August 2007 by  Dr. Melvern Banker, where he was found to have significant subtotal occlusion  with thrombus burden, involving the proximal portion of the vein graft  to his circumflex marginal vessel.  He had a patent LIMA to his LAD and  a patent SVG to his RCA.  He ultimately underwent stenting 2 days later  on June 12, 2006 of the SVG to the OM vessel with insertion of Taxus  stenting.  The patient had been on aspirin and Plavix.  Apparently,  subsequently he developed a significant dental abscess and apparently  stopped taking Plavix for several days.  He also was started on  clindamycin for antibiotic therapy.  Apparently, over the past month, he  has developed progressive diarrhea symptoms highly suggestive of  pseudomembranous enterocolitis.  Apparently, he was seen by GI physician  and since over the past week he apparently had been off Plavix in  anticipation of colonoscopy.  The patient had noted recurrent  interscapular pain which is his anginal equivalent.  He was seen in the  office on October 03, 2006, by Dr. Rex Kras, and  admitted to [facility].  He was started on integralin, heparin.  Troponins are  positive.  He is pain-free now and brought to the catheterization  laboratory October 04, 2006 for a definitive catheterization.   DESCRIPTION OF PROCEDURE:  After premedication with valium 5 mg, the  patient was prepped and draped in the usual fashion.  His right femoral  artery was punctured anteriorly and a 5-French sheath was inserted.  Diagnostic catheterization was done utilizing 5-French Judkins 4-left  and right coronary catheters.  A right catheter was used for angiography  into the saphenous vein to the right coronary artery.  A LIMA catheter  was used for selective angiography into the left internal mammary  artery.  In an attempt to obtain optimal seating of the vein graft to  the marginal vessel, which had a flush occlusion, also a left bypass  graft catheter was used.  A pigtail catheter was inserted for left  ventriculography.  The patient tolerated the procedure well.   HEMODYNAMIC DATA:  Central aortic pressure was 117/61.  Left  ventricular  pressure was 117/8 post A-wave 29.   ANGIOGRAPHIC DATA:  The left main was patent at site of prior stenting.  The LAD was occluded after a takeoff of a septal and diagonal vessel.   The circumflex vessel was occluded after a takeoff of the small  dimunitive marginal vessel, which was occluded.  There was a very faint  collateralization to the distal marginal vessel.   The right coronary artery was totally occluded at its origin at site of  native RCA stenting.   The vein graft, supplying the PDA, was widely patent and anastomosed  into the mid PDA.   The left internal mammary artery was widely patent and anastomosed into  the mid LAD.  There was noted to be a 70% ostial left vertebral artery  arising form the subclavian system.   There was essentially flush occlusion of the saphenous vein previously  supplying the marginal vessel.    Bi-plan sinography revealed mild LV dysfunction with focal anterolateral  hypocontractility and a mild mid inferior hypocontractility in the RAO  projection.  On the LAO projection there was a moderate region of  hypokinesis involving the mid septal wall.  There also was mild  hypocontractility involving the upper posterolateral wall.   IMPRESSION:  1. Mild LV dysfunction with anterolateral, mid septal      hypocontractility involving the upper posterolateral inferior      lateral wall.  2. Total occlusion of the LAD after the septal and diagonal vessel,      total occlusion of the circumflex vessel in the mid AV groove in OM      vessel proximally in total osteal made of RCA occlusion.  3. Widely patent saphenous vein graft supplying the mid PDA.  4. Widely patent LIMA graft supplying the LAD.  5. A flush occlusion of the vein graft previous supplying the      circumflex marginal vessel.   DISCUSSION:  [name] presents with a 1 week history of interscapular  chest pain most likely resulting from occlusion of the vein graft  supplying his marginal vessel.  The patient apparently had been off  Plavix during this time despite having had his Taxus stent inserted in  this vein graft in August 2007.  His troponin's are positive.  He  currently has been pain-free.  In light of significant thrombotic burden  to the graft with occlusion at minimum greater than 24-48 hours.  I  recommend continue medical therapy with addition of Ranexa, as well as  consideration for ECP treatment to improve collateralization.  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  We will need to  discuss with GI with reference to timing of potential colonoscopy.           ______________________________  Shelva Majestic, M.D.    TK/MEDQ  D:  10/05/2006  T:   10/06/2006  Job:  [id number]   cc:   Delfino Lovett A. Rollene Fare, M.D.  Jeanella Craze. Little, M.D.

## 2011-03-16 NOTE — Cardiovascular Report (Signed)
NAME:  [name], Christopher Wilson                ACCOUNT NO.:  [id number]   MEDICAL RECORD NO.:  [id number]          PATIENT TYPE:  INP   LOCATION:  1826                         FACILITY:  [place]   PHYSICIAN:  Bryson Dames, M.D.DATE OF BIRTH:  September 06, 1941   DATE OF PROCEDURE:  06/10/2006  DATE OF DISCHARGE:                              CARDIAC CATHETERIZATION   PROCEDURES PERFORMED:  1. Combined left heart catheterization with selective coronary angiography      of the native coronary circulation.  2. Retrograde left heart catheterization.  3. Left ventricular angiography.  4. Internal mammary artery bypass graft angiography.  5. Saphenous vein graft angiography, multiple.   COMPLICATIONS:  None.   ENTRY SITE:  Right femoral.   DYE USED:  Omnipaque.   PATIENT PROFILE:  The patient is a 70 year old white gentleman who has a  previous history of intracoronary artery stent deployment then bypass  surgery and now intermediate coronary syndrome for the last week to 10 days.  He was admitted this afternoon for his symptoms and initial troponin was  elevated at 0.21.  The patient came to the cath lab electively for cardiac  cath.   RESULTS:  The left main coronary artery was very difficult to intubate.  It  was unremarkable in appearance, though small.   The left anterior descending coronary artery contained a patent stent in the  mid portion of the vessel.  The distal runoff into the remaining LAD showed  competitive flow.   Left circumflex coronary artery is 100% occluded midway down.   Right coronary artery is 100% occluded just at the ostium with a radio-  opaque stent 100% occluded.  No antegrade flow into the distal RCA is noted.   Right coronary bypass graft is widely patent throughout and inserts into the  mid portion of the posterior descending.  The dye goes back to the  bifurcation of the RCA and there is normal retrograde filling into a large  posterolateral branch which  actually has two branches.   Saphenous vein graft to obtuse marginal branch contains subtotally occlusive  thrombus extending about 10-12 mm from the ostium.  There is an eccentric  lumen with dye filling the distal vessel and then flow is approximately TIMI  1-2.  There is a large filling defect at the ostium of this graft!   Internal mammary artery appears patent.  Distal runoff into LAD is somewhat  sluggish, but non-occlusive and the distal vessel is small.   The left subclavian appears normal.   Left ventricular angiography shows sluggish wall motion of the entire  inferior wall severely, apex mildly, anterolateral wall mildly.  Ejection  fraction estimate is 30-40%.  No mitral regurgitation is seen.  No LV  thrombus.   PLAN:  The patient will undergo anticoagulation with heparin and Integrilin  for the next 36 hours and will be brought back to the cath lab to see if  there is room for percutaneous intervention of the saphenous vein graft to  obtuse marginal which appears to be his culprit vessel to explain his  intermediate coronary syndrome.  ______________________________  Bryson Dames, M.D.     WHG/MEDQ  D:  06/10/2006  T:  06/11/2006  Job:  [id number]   cc:   Cath Lab

## 2011-03-16 NOTE — H&P (Signed)
NAME:  Christopher Wilson, Christopher Wilson                ACCOUNT NO.:  [id number]   MEDICAL RECORD NO.:  [id number]          PATIENT TYPE:  EMS   LOCATION:  MAJO                         FACILITY:  [facility]   PHYSICIAN:  Richard A. Rollene Fare, M.D.DATE OF BIRTH:  04-Jul-1941   DATE OF ADMISSION:  06/10/2006  DATE OF DISCHARGE:                                HISTORY & PHYSICAL   CHIEF COMPLAINT:  Shoulder pain and mid back pain.   HISTORY OF PRESENT ILLNESS:  Christopher Wilson is a 70 year old male followed by  Dr. Rollene Fare with a history of coronary disease.  He had bypass surgery in  May 2000.  He had a complex intervention in February 2006.  At that time, he  had a left main LAD and second diagonal intervention with Taxus stenting.  He had a patent LIMA to the LAD, patent SVG to the RCA and 40-50% SVG to the  OM1.  He has had preserved LV function.  He has had problems in the past  with noncompliance.  He has been a heavy smoker.  He has stopped several  medications in the past because of cost.  He just saw Dr. Rollene Fare in the  office June 04, 2006 and complained of some intrascapular and upper neck  discomfort with some radiation of the left arm for the last couple weeks.  Dr. Rollene Fare increased his nitrates and set him up for a Myoview study.  This morning he woke at 2:00 a.m. with increasing intrascapular pain  associated with some mild diaphoresis.  He is admitted to the emergency  room, started on heparin and nitrates with relief of his symptoms.  His  troponins are elevated with a peak troponin of 0.23.  He is currently pain  free.   PAST MEDICAL HISTORY:  Remarkable for remote PCI at [facility] in 1989 and again  at [facility] in 1999.  He underwent bypass surgery as noted Mar 15, 1999 by Dr.  Prescott Gum.  He has dyslipidemia and smoking.   CURRENT MEDICATIONS:  1. Atenolol 25 mg a day.  2. Imdur 30 mg b.i.d.  3. Aspirin 81 mg a day.   ALLERGIES:  He has no known drug allergies, he has not necessarily been  intolerant to statins in the past it is just that he has not been able to  afford medications.  He has also been on Plavix in the past but stopped this  because of excessive bleeding according to the patient.   SOCIAL HISTORY:  He is divorced, he has two children.  He is a retired Water engineer.  He continues to smoke about a pack a day.   FAMILY HISTORY:  Remarkable in that his father died at 23 of an MI.   REVIEW OF SYSTEMS:  Essentially unremarkable except for noted above.  He has  had no GI bleeding.  He had carotid Dopplers for some bruits in 2000 that  were essentially normal.   PHYSICAL EXAM IN THE EMERGENCY:  VITAL SIGNS:  Blood pressure 113/77, pulse  56, O2 sat 96 on room air, respirations 12.  GENERAL:  He is a well-developed, well-nourished male in no acute distress.  HEENT:  Normocephalic. Extraocular  movements are intact. Sclera is  nonicteric.  NECK:  Without JVD and without bruit.  CHEST:  Clear to auscultation and percussion.  CARDIAC:  Reveals regular rate and rhythm without murmur, rub or gallop.  Normal S1, S2.  ABDOMEN:  Nontender.  No hepatosplenomegaly. She does have a ventral hernia.  EXTREMITIES:  Without edema.  Distal pulses are intact. There are no femoral  bruits noted.  NEURO:  Exam is grossly intact.  He is awake, alert, oriented, and  cooperative and moves all extremities without obvious deficit.  SKIN:  Warm and dry.   LABORATORY DATA:  White count 6.9, hemoglobin 14.3, hematocrit 40.5,  platelets 238.  Sodium 138, potassium 4.5, BUN 18, creatinine 1.4.  Troponin  peaked at 0.23.  EKG showed sinus rhythm without acute changes.   IMPRESSION:  1. Unstable angina.  2. Known coronary disease with remote PCIs and bypass surgery in May 2000      with subsequent left main LAD and second diagonal Taxus stenting in      February 2006.  3. Treated hypertension.  4. Long history of smoking.  5. Untreated dyslipidemia.  6. Noncompliance  secondary to financial reasons.  7. Strong family history of coronary disease.   PLAN:  The patient will be admitted to telemetry.  He will be continued on  heparin and nitrates.  He will need a diagnostic catheterization, possibly  today although unfortunately, the patient's daughter brought him an egg  salad sandwich which he had just finished when I went to see him.      Erlene Quan, P.A.      Richard A. Rollene Fare, M.D.  Electronically Signed    LKK/MEDQ  D:  06/10/2006  T:  06/10/2006  Job:  [id number]

## 2011-03-16 NOTE — Cardiovascular Report (Signed)
NAME:  Christopher Wilson, Christopher Wilson                ACCOUNT NO.:  [id number]   MEDICAL RECORD NO.:  [id number]          PATIENT TYPE:  OIB   LOCATION:  2899                         FACILITY:  [facility]   PHYSICIAN:  Richard A. Rollene Fare, M.D.DATE OF BIRTH:  Mar 20, 1941   DATE OF PROCEDURE:  12/19/2004  DATE OF DISCHARGE:                              CARDIAC CATHETERIZATION   PROCEDURES:  1.  Retrograde central aortic catheterization.  2.  Selective coronary angiography by Judkins technique.  3.  Pre and post intracoronary nitroglycerin administration.  4.  Left ventricular angiogram in RAO and LAO position.  5.  Saphenous vein graft angiography.  6.  Left internal mammary artery angiography.  7.  Abdominal aortic angiogram in midstream PA projection.  8.  Aggrastat double-bolus infusion.  9.  Plavix 600 mg p.o.  10. Weight-adjusted heparin 5500 units.  11. Monitored ACT total.  12. Balloon dilatation of subtotal left main stenosis with jeopardized DX1      and DX2.  77. Cutting balloon atherectomy of mid DX2.  14. Left anterior descending __________, main left.  46. DES stenting of mid DX2 stent to LAD.  18. DES stenting to main left coronary artery.   BRIEF HISTORY:  [name] is a 70 year old, divorced, white father of two  with one grandchild.  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  He has a long history of  coronary disease and a family history of coronary disease.  He is a smoker  and still smokes one to one-and-a-half packs a day.  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  He has  hyperlipidemia and the patient has not been compliant and essentially  declined treatment in the past for severe elevated cholesterol of 300 and  HDL over 200.   He underwent initial PTCA at [facility] in 1989 and subsequent PTCA at [facility] by me  in 1999.   He subsequently suffered an acute DMI on Mar 15, 1999, with RV infarction  and required RCA ostial and proximal bare metal stents.  He had IABP placed.  He was studied later in the hospitalization and because of significant main  left coronary disease subsequently underwent elective CABG x 3 by Dr. Prescott Gum on Mar 15, 1999, with LIMA to LAD, SVG to OM and SVG to RCA.  He has  been followed intermittently in the past, but has not been seen since  December of 2004.  Statin medication was recommended and prescribed, but the  patient did not take this.  He has done well until the last month when he  had recurrent typical angina.  He was seen by Dr. Rex Kras in the office on  December 18, 2004, and set up for catheterization today.  Imdur was added to  his regimen, which was aspirin and beta blocker.   DESCRIPTION OF PROCEDURE:  The patient was brought to the second floor CP  laboratory in a post absorptive state after 5 mg of Valium p.o.  premedication.  Baseline BUN and creatinine were  17/1.2, cholesterol 300 and  LDL 222.  The right groin was prepped and draped in the usual manner.  One  percent Xylocaine was used for local anesthesia.  The RCFA was entered with  a single anterior puncture using an 18 thin wall needle and a 6 French short  Diag sidearm sheath was inserted without difficulty.  Diagnostic coronary  angiography, saphenous vein graft angiography and LV angiogram, RAO and LAO,  and abdominal angiogram were done with preformed 6 [place] Cordis 4 cm  tapered coronary catheters and pigtail catheter.  Omnipaque dye was used  throughout the procedure.  Saphenous vein graft angiography was done in the  right coronary artery.  A Sci-Med 6 French LIMA catheter was used for  selective IMA.  Abdominal angiography was done in the PA projection using 25  mL at 20 mL/sec.  LV angiogram was done at 25 mL at 14 mL/sec RAO and 20 mL  at 12 mL/sec LAO projection.  Pullback pressure of the CA was performed.  It  showed no gradient across the aortic valve.   The patient  tolerated the diagnostic procedure well.   Left ventricle:  130/0; LVEDP 18-24 mmHg.   CA:  130/80 mmHg.   There was no gradient across the aortic valve on catheter pullback.   LV angiogram demonstrated hypo-akinesis of the basilar third of the inferior  wall, akinesis of the mid inferior wall and hypo-akinesis of the distal  third of the inferior wall.  There was mild hypokinesis of the mid  anterolateral wall and the posterior apical segment.  The EF was  approximately 50% with no MR.  Abdominal angiogram revealed patent left  renal artery, congenitally absent right renal artery and a large left kidney  with good nephrogram.  The infrarenal abdominal aorta had moderately severe  ulcerative atherosclerosis without aneurysm or stenosis.  The proximal  iliacs were intact.  The proximal SMA and celiac were intact.   The main left coronary artery had 95% ostial proximal and segmental stenosis  with 90% of the distal third.   The circumflex artery was totally occluded after a small OM1 branch in its  proximal third with no antegrade filling.   The LAD was totally occluded after a moderately large DX2 branch and septal  perforator.  The septal perforator was moderately large.  There was a first  diagonal from the proximal LAD that had irregularities with about 50%  stenosis and no significant stenosis followed by an SP1 that had 90%  stenosis.  The SP2 was at across the origin from the DX2.  The LAD was  totally occluded at the [facility] with no antegrade filling.   The DX2 had 85% stenosis in the proximal mid third.  There was 90% stenosis  of the LAD-DX2 origin.  There was another 30% eccentric stenosis of the  proximal LAD-left main origin.   The right coronary was totally occluded after a 99% proximal lesion and an  atrial branch.  No antegrade filling.  No antegrade filling.  The saphenous vein graft to the RCA was widely patent with excellent  anastomosis to the PDA.  There was  retrograde filling of the distal RCA and  the PLA.  The inferior PLA bifurcation branch had 85% stenosis.  The  remainder of the supplied vessels had no significant stenosis.   The saphenous vein graft to the mid circumflex marginal branch showed an  excellent anastomosis with good filling of the trifurcation beyond the  anastomosis.  There was retrograde stenosis of 95% of a distal circumflex  branch.   The graft itself had 40-50% smooth eccentric narrowing in its proximal  portion just beyond the ostia and another 30-40% smooth narrowing in the mid  portion.  There was good flow throughout.   The LIMA had an excellent anastomosis to the mid LAD and filled the LAD down  to the apex.  There was approximately 50% narrowing of the mid LAD and there  was a relatively thin LAD, but good flow.   This patient has typical recurrent angina and has complex anatomy as  outlined above.  He has high-grade left main stenosis with jeopardized DX1  and DX2 and is a candidate for culprit lesion intervention in view of his  significant symptoms.  He will also need aggressive ancillary medical  therapy and discontinuation of smoking along with lipid-lowering therapy.   Informed consent was obtained to proceed.  The patient was given weight-  adjusted heparin, monitoring ACTs, a total of 6500 units during the  procedure, 600 mg of Plavix and double-bolus Aggrastat plus infusion.  IC  nitroglycerin was used intermittently and he was given 4 mg of Versed for  sedation during the procedure and 20 mg of __________.   The left main was intubated with a JL4 guiding catheter.  It was difficult  to maintain this coaxially.  Initially a Asahi light wire was used to enter  the diagonal.  The left main was dilated with a 1.5/15 Maverick balloon at  10-30 and 12-40.  The wire had then prolapsed back.  The guiding catheter  was then changed to a CLS 3.5 6 [place] guide and the lesion required a  Choice PT2LS wire  to negotiate the left main and the second diagonal.  The  wire was free in the distal second diagonal and the 1.5 balloon was used to  dilate the diagonal LAD and the mid diagonal at 10-25 and 10-25.  It was  then used to redilate the left main at 12-40.   This was exchanged for a 2.0/10 Sci-Med cutting balloon which was used to  dilate the left main at 6-30, 7-30 and 7-40.  The balloon was then used to  dilate the LAD diagonal origin at 8-35 and the proximal-mid diagonal at 7-  25.   The balloon was then upgrade to a 2.5/10 cutting balloon and the left main  was redilated at 6-45, 7-40 and 7-35.   We then exchanged for a Taxus Express II 2.5/20 DES stent.  This was  positioned in the proximal-mid diagonal and covered the diagonal and the LAD  diagonal origin across the SP2.  It was deployed at 12-35, post dilated at  12-30 with good angiographic result.  The left main was then stented with a 3.0/12 Taxus Express II DES stent.  It  was carefully positioned fluoroscopically to cover the ostium and protrude  about 1-2 mm beyond the ostia, deployed at 14-25.  Post dilated at 18-40 and  the ostium post dilated at 18-30 to flare the stent.  The balloon was pulled  back after IC nitroglycerin final injections were obtained.   This showed excellent angiographic result with main left stenosis reduced  from 95% to 0%.  The mid diagonal was reduced from 85% to 0% and the LAD  diagonal origin with the long stent was reduced from 90% to 0%.  There was  good TIMI-3 flow to the second diagonal and first diagonal in the small  remnant of the circumflex.  The patient tolerated the procedure well.  The  dilatation system was removed.  The sidearm sheath was flushed and secured  to the skin.  The patient was brought to the holding area for postoperative  care in stable condition.   CATHETER DIAGNOSES:  1.  Atherosclerotic heart disease, status post right coronary percutaneous      coronary  intervention in 1989 at [facility] and repeat percutaneous coronary      intervention at [facility] in 1999.  2.  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 Melvern Banker.  3.  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  4.  Status post elective coronary artery bypass graft x 3 on Mar 14, 2005,      by Dr. Prescott Gum.  5.  Recurrent angina with anatomy as outlined above.  6.  Successful main left cutting balloon atherectomy and subsequent DES      stenting.  7.  Successful proximal-mid diagonal and left anterior descending diagonal      ostia cutting balloon atherectomy and DES stenting.  8.  Left ventricular dysfunction, ejection fraction of approximately 50%,      inferior wall motion abnormality.  9.  Continued cigarette abuse.  10. Hyperlipidemia.  The patient declined statins in the past.  11. Systemic hypertension.  12. Single left kidney, congenital, with normal renal artery.  13. Severe hyperlipidemia.      RAW/MEDQ  D:  12/19/2004  T:  12/19/2004  Job:  [id number]   cc:   CP Laboratory   Jeanella Craze. Little, M.D.  [address]  [place] [address]  Fax: [phone]

## 2011-03-16 NOTE — Discharge Summary (Signed)
NAME:  Christopher Wilson, Christopher Wilson                ACCOUNT NO.:  [id number]   MEDICAL RECORD NO.:  [id number]          PATIENT TYPE:  INP   LOCATION:  [id number]                         FACILITY:  [facility]   PHYSICIAN:  Bryson Dames, M.D.DATE OF BIRTH:  [birth year]   DATE OF ADMISSION:  06/10/2006  DATE OF DISCHARGE:  06/13/2006                                 DISCHARGE SUMMARY   Mr. Beck Christopher Wilson is a 70 year old male patient with a history of coronary  artery disease.  He had a remote PCI done in 1989 to 1999.  He had a CABG x3  in May of 2000.  He had a LIMA to his LAD and diagonal.  He had TAXUS PCI,  February of 2006.  He had a Cardiolite that was negative in June of 2006.  His last office visit was June 04, 2006.  He had been having increasing  scapular, nitrates were increased and a Cardiolite was scheduled.  This a.m.  he had awakened with chest pain at 2 a.m.  He was started on IV heparin and  nitroglycerin and he was pain free in the emergency room.  His troponin was  elevated at 0.23, thus he was admitted to the hospital and taken to the cath  lab.  His cath showed his LIMA to his LAD was okay and his SVG to his OM1  circumflex had a subtotal occlusion at the ostial portion of the graft.  He  underwent heparin and Integrilin and Dr. Melvern Banker decided to restudy him in 36  hours.  His SVG to his RCA was okay and he had severe native 3-vessel native  disease.  He had no distal disease past his graft.  His EF was 30% to 40%.  He was restudied on June 12, 2006 by Dr. Melvern Banker and he underwent TAXUS  stenting.  He had a stent to his proximal SVG reduced from 95 to 0, a stent  to his mid SVG.  He had a dissection and a stent to his distal SVG with a  dissection.  He had a PTCA to his very distal SVG with a dissection.  He had  an Angio-Seal.  These stents were all overlapping and they were all TAXUS  stents.  The following day, his blood pressure is 120/63.  He heart rate was  59.  His respirations were  20.  His CK-MB was 85/3.9.  On June 13, 2006,  he was considered stable.  He does have a history of noncompliance secondary  to financial costs of his medications.  Thus, we set him up with our office  to register with some of the drug companies for their indigent drug  programs.   LABS:  His hemoglobin was 13.1, hematocrit was 38.1, WBCs were 5.9,  platelets were 192.  His sodium was 137, potassium was 3.7, his chloride of  108, CO2 was 20, glucose was 104, BUN was 19, creatinine was 1.4.  His AST  was 25, ALT was 17.  CK-MB, number 1, 144/9.0, troponin 0.37.  Number 2  103/5.6, troponin was 0.61.  number 3 100/4.7, a  troponin of 0.61.  Post  procedure, CK 85, MB 3.9.  Total cholesterol is 310, triglycerides were 138,  HDL was 37 and his LDL was 245.  His TSH was 0.815.  His urine was negative.  I do not see a chest x-ray in the chart at the time of this dictation.   DISCHARGE MEDICATIONS:  Are:  1. Aspirin 81 mg one time per day.  2. Metoprolol 25 mg two times per day.  3. Pravastatin 40 mg two times per day.  4. Lisinopril 10 mg one time per day.  5. Plavix 75 mg one time per day, he is not to stop it.  6. Zantac 300 mg one time per day.  7. Nitroglycerin 150 one under his tongue every 5 minutes x3 for chest      pain.   He has an appointment has at [facility], June 17, 2006 at 9 a.m.  He  will see Dr. Rollene Fare in office in 2 weeks, our office will call.  He  should go to our office and bring in records of his last year's income to  qualify for the Plavix program to get his meds for free.  His was given  trial cards on the day of discharge.   DISCHARGE DIAGNOSES:  1. Acute coronary syndrome.  2. Progressive coronary artery disease with thrombus in his saphenous vein      graft to his obtuse marginal with Integrilin infusion for 36 hours then      TAXUS stenting with 3 overlapping stents in the graft.  3. History of coronary artery bypass graft of the LIMA to his left       anterior descending and saphenous vein graft to his right coronary      artery and a saphenous vein graft to his obtuse marginal.  4. Ischemic cardiomyopathy with an ejection fraction of 30% to 40%.  5. Hyperlipidemia with an LDL of 245.  6. Tobacco use.  7. History of noncompliance with meds secondary to money issues.      Cyndia Bent, N.P.    ______________________________  Bryson Dames, M.D.    BB/MEDQ  D:  07/21/2006  T:  07/22/2006  Job:  [id number]   cc:   Delfino Lovett A. Rollene Fare, M.D.

## 2011-03-16 NOTE — Discharge Summary (Signed)
NAME:  Christopher Wilson, Christopher Wilson                ACCOUNT NO.:  [id number]   MEDICAL RECORD NO.:  [id number]          PATIENT TYPE:  OIB   LOCATION:  6529                         FACILITY:  [facility]   PHYSICIAN:  Richard A. Rollene Fare, M.D.DATE OF BIRTH:  April 15, 1941   DATE OF ADMISSION:  12/19/2004  DATE OF DISCHARGE:  12/20/2004                                 DISCHARGE SUMMARY   DISCHARGE DIAGNOSIS:  1.  Unstable angina.  2.  Coronary disease, coronary artery bypass grafting May 2000.  3.  Dyslipidemia.  4.  History of smoking.  5.  Treated hypertension.   HOSPITAL COURSE:  The patient is a 70 year old male followed by Dr.  Rollene Fare who comes into the office December 18, 2004,  with chest pain  consistent with unstable angina. He has long history of coronary disease. He  had an angioplasty in 1999 at [facility].  He also had angioplasty in 1989 by Dr.  Rollene Fare. He had an MI in May 2000, and subsequently underwent stenting and  eventual bypass surgery Mar 13, 1999. He was seen in the office December 18, 2004, with chest pain. He had had two  episodes of chest pain that were  nitrite responsive. He was set up for diagnostic catheterization. He is  admitted December 19, 2004, for elective catheterization. This was done by  Dr. Rollene Fare and revealed a patent SVG to the RCA with some distal 85% PLA  disease, patent LIMA to the LAD with distal 50% LAD narrowing, patent SVG to  the OM with 95% side branch stenosis and a 95% proximal LAD and mid LAD. The  LAD was occluded after the second diagonal. The patient underwent PCI and  stenting to the proximal LAD lesion. The native circumflex was totaled at  mid vessel. He also underwent angioplasty to the mid LAD lesion.  He  tolerated procedure well. He was put on Aggrastat. He was seen by smoking  cessation during this hospitalization. We feel he can be discharged on  December 20, 2004.  Dr. Rollene Fare has added Vytorin.   DISCHARGE LABORATORY DATA:  Sodium  137, potassium 3.9, BUN 18, creatinine  1.6.  Troponin slightly positive at 0.14,  CK of 136, CK-MB 3.7.  Uric acid  was 7.3   Chest x-ray:  No acute changes.   Hematology shows a white count of 8.0, hemoglobin 13.5,  hematocrit 37.8,  platelets 242.   DISCHARGE MEDICATIONS:  1.  Plavix 75 mg a day.  2.  Coated aspirin daily.  3.  Vytorin 1040 daily.  4.  Atenolol 25 mg a day.  5.  Wellbutrin 150 b.i.d.  6.  Nitroglycerin sublingual p.r.n..   He will follow up with Dr. Rollene Fare in a couple of weeks in the office. He  had been on Imdur but the patient wanted to try and see if he could go  without this at this point. He knows he can resume it if he has recurrent  chest pain and that he does have some distal vessel disease.      LKK/MEDQ  D:  12/20/2004  T:  12/20/2004  Job:  [id number]

## 2011-03-16 NOTE — Discharge Summary (Signed)
NAME:  Christopher Wilson, Vonnie                ACCOUNT NO.:  [id number]   MEDICAL RECORD NO.:  [id number]          PATIENT TYPE:  INP   LOCATION:  2039                         FACILITY:  [facility]   PHYSICIAN:  York Grice, P.A. DATE OF BIRTH:  1941-01-28   DATE OF ADMISSION:  10/03/2006  DATE OF DISCHARGE:  10/07/2006                               DISCHARGE SUMMARY   DISCHARGE DIAGNOSIS:  1. Unstable angina pectoris status post catheterization during this      admission, no intervention.  Continue Plavix and aspirin and Ranexa      added.  2. Clostridium difficile colitis after antibiotic therapy of his      infection, now on Flagyl therapy.  3. Known coronary artery disease status post coronary artery bypass      grafting in 2000.  4. Ischemic cardiomyopathy with ejection fraction 30% to 40%.  5. Hyperlipidemia.   HISTORY OF PRESENT ILLNESS/HOSPITAL COURSE:  The patient is a 70-year-  old Caucasian gentleman, patient of  Dr. Rollene Fare, who was admitted to  the [facility] with complaints of chest pain.  He recently  underwent Taxus stenting of the SVG to RCA.  The procedure was performed  by Dr. Melvern Banker.  It was very complex, and the patient was discharged home  on Plavix therapy and aspirin.  He developed a chest abscess and stopped  taking Plavix for several days.  Then, he was treated with clindamycin  and as a consequence developed bloody diarrhea.  The patient was  scheduled for a colonoscopy, and on Monday, September 30, 2006, stopped  taking Plavix again.  He developed an interscapular pain on October 03, 2006 and came to the office.  Dr. Rex Kras saw him and admitted him to the  hospital on rule out MI protocol and for catheterization.   During this hospitalization, his cardiac enzymes were positive first set  revealed CK 176, CK-MB 12.0, troponin 1.61.  Second set was CK 151, CK-  MB 10, troponin 2.11.  The third set revealed CK 119, MB 8.1, and  troponin 1.50.  Actually, the  patient suffered myocardial damage;  although, his EKGs did not reveal any significant changes.   On October 06, 2006, he underwent coronary angiography performed by Dr.  Claiborne Billings.  Tests revealed total occlusion of the SVG to the circumflex  marginal vessel where the Taxus stent was inserted in August.  Because  the patient had been free of pain and in light of significant thrombotic  burden to the graft, continued medical therapy was recommended with the  addition of Ranexa as well as consideration for ECP treatment to improve  collateralization.   We requested the gastroenterologist consultation and Dr. Amedeo Plenty saw  patient.  The Clostridium difficile was ordered and revealed a positive  result, so Flagyl therapy was initiated.   Dr. Claiborne Billings had seen the patient on October 03, 2006, the day of his  discharge, and considered him to be stable.   DISCHARGE DIAGNOSES:  1. Status post nonST elevation myocardial infarction - status post      catheterization revealing occluded saphenous vein  graft to the      circumflex.  Discharged on aspirin and Plavix.  2. Known coronary artery disease status post coronary artery bypass      graft in 2000.  3. Ischemic cardiomyopathy with ejection fraction 30 to 40%.  4. Hyperlipidemia.  5. Clostridium difficile colitis on Flagyl therapy.   DISCHARGE MEDICATIONS:  1. Ranexa 500 mg b.i.d.  2. Aspirin 81 mg daily.  3. Lopressor 50 mg b.i.d.  4. Pravastatin 40 mg b.i.d.  5. Plavix 75 mg daily.  6. Fish oil 1000 mg daily.  7. Multivitamins daily.  8. Nitroglycerin sublingual as directed.   DISCHARGE DIET:  Low-salt, low-fat, low-cholesterol diet.   He was not allowed to drive for a couple of days after discharge.   The patient was instructed to report any problems with groin puncture  site, and he will be seen by Dr. Rollene Fare in 2-3 weeks, and our office  will contact the patient to schedule a followup appointment.      York Grice,  P.A.     MK/MEDQ  D:  10/07/2006  T:  10/08/2006  Job:  [id number]   cc:   Delfino Lovett A. Rollene Fare, M.D.  Tory Emerald Benson Norway, MD

## 2011-07-30 LAB — BASIC METABOLIC PANEL
BUN: 12
Chloride: 107
GFR calc Af Amer: >60
GFR calc non Af Amer: >60
Potassium: 4.9

## 2011-07-30 LAB — CBC
HCT: 46.6
MCHC: 33.8
MCV: 92.7
MCV: 92.9
Platelets: 211
Platelets: 217
RBC: 4.52
RBC: 5.01
WBC: 7.8

## 2011-07-30 LAB — APTT: aPTT: 26

## 2011-07-30 LAB — POCT CARDIAC MARKERS
CKMB, poc: 7.9
Myoglobin, poc: 174
Myoglobin, poc: 289
Troponin i, poc: <0.05

## 2011-07-30 LAB — POCT I-STAT, CHEM 8
Calcium, Ion: 1.21
Chloride: 111
Glucose, Bld: 111 — ABNORMAL HIGH
HCT: 41
Hemoglobin: 13.9
TCO2: 23

## 2011-07-30 LAB — DIFFERENTIAL
Basophils Absolute: 0
Basophils Relative: 1
Eosinophils Absolute: 0.2
Monocytes Relative: 10
Neutro Abs: 3.5
Neutrophils Relative %: 60

## 2011-07-30 LAB — CARDIAC PANEL(CRET KIN+CKTOT+MB+TROPI)
CK, MB: 6.6 — ABNORMAL HIGH
Relative Index: 4.3 — ABNORMAL HIGH
Relative Index: 4.4 — ABNORMAL HIGH
Troponin I: 0.01

## 2011-07-30 LAB — PROTIME-INR
INR: 0.9
Prothrombin Time: 12.3

## 2011-07-30 LAB — HEPARIN LEVEL (UNFRACTIONATED): Heparin Unfractionated: 0.42

## 2011-07-30 LAB — LIPID PANEL
Cholesterol: 180
HDL: 37 — ABNORMAL LOW
Total CHOL/HDL Ratio: 4.9
Triglycerides: 88

## 2011-07-30 LAB — TROPONIN I: Troponin I: 0.01

## 2011-08-08 LAB — BASIC METABOLIC PANEL
CO2: 25
Calcium: 8.5
Creatinine, Ser: 1.59 — ABNORMAL HIGH
GFR calc Af Amer: 53 — ABNORMAL LOW
GFR calc non Af Amer: 44 — ABNORMAL LOW
Glucose, Bld: 121 — ABNORMAL HIGH

## 2011-08-08 LAB — CBC
MCHC: 34.4
Platelets: 164
RDW: 13.8

## 2011-08-09 LAB — COMPREHENSIVE METABOLIC PANEL
ALT: 17
AST: 25
Alkaline Phosphatase: 64
CO2: 24
Calcium: 9.5
Chloride: 106
GFR calc Af Amer: >60
GFR calc non Af Amer: 51 — ABNORMAL LOW
Potassium: 4.5
Sodium: 135
Total Bilirubin: 0.9

## 2011-08-09 LAB — CBC
Hemoglobin: 15.3
RBC: 4.7
WBC: 7.2

## 2011-08-09 LAB — URINALYSIS, ROUTINE W REFLEX MICROSCOPIC
Bilirubin Urine: NEGATIVE
Glucose, UA: NEGATIVE
Hgb urine dipstick: NEGATIVE
Protein, ur: NEGATIVE
Specific Gravity, Urine: 1.011
Urobilinogen, UA: 0.2

## 2011-08-09 LAB — URINE MICROSCOPIC-ADD ON

## 2011-08-09 LAB — TYPE AND SCREEN: Antibody Screen: NEGATIVE

## 2011-08-09 LAB — ABO/RH: ABO/RH(D): A POS

## 2011-08-10 LAB — CBC
HCT: 41.7
Platelets: 225
RDW: 13.2
WBC: 7.2

## 2011-08-10 LAB — PROTIME-INR
INR: 0.9
Prothrombin Time: 12.7

## 2011-08-10 LAB — BASIC METABOLIC PANEL
BUN: 22
Calcium: 9.4
GFR calc non Af Amer: 47 — ABNORMAL LOW
Glucose, Bld: 98
Potassium: 4.2

## 2011-08-10 LAB — APTT: aPTT: 29

## 2011-08-22 ENCOUNTER — Emergency Department (HOSPITAL_COMMUNITY)
Admission: EM | Admit: 2011-08-22 | Discharge: 2011-08-22 | Disposition: A | Discharge status: Home / Self Care | Payer: Medicare PPO | Attending: Emergency Medicine | Admitting: Emergency Medicine

## 2011-08-22 ENCOUNTER — Emergency Department (HOSPITAL_COMMUNITY): Payer: Medicare PPO

## 2011-08-22 DIAGNOSIS — I252 Old myocardial infarction: Secondary | ICD-10-CM | POA: Insufficient documentation

## 2011-08-22 DIAGNOSIS — R079 Chest pain, unspecified: Secondary | ICD-10-CM | POA: Insufficient documentation

## 2011-08-22 DIAGNOSIS — R1013 Epigastric pain: Secondary | ICD-10-CM | POA: Insufficient documentation

## 2011-08-22 DIAGNOSIS — Z7982 Long term (current) use of aspirin: Secondary | ICD-10-CM | POA: Insufficient documentation

## 2011-08-22 DIAGNOSIS — Z79899 Other long term (current) drug therapy: Secondary | ICD-10-CM | POA: Insufficient documentation

## 2011-08-22 DIAGNOSIS — I2510 Atherosclerotic heart disease of native coronary artery without angina pectoris: Secondary | ICD-10-CM | POA: Insufficient documentation

## 2011-08-22 LAB — DIFFERENTIAL
Basophils Absolute: 0 10*3/uL (ref 0.0–0.1)
Basophils Relative: 1 % (ref 0–1)
Eosinophils Absolute: 0.3 10*3/uL (ref 0.0–0.7)
Eosinophils Relative: 4 % (ref 0–5)
Neutrophils Relative %: 64 % (ref 43–77)

## 2011-08-22 LAB — BASIC METABOLIC PANEL
Calcium: 9.9 mg/dL (ref 8.4–10.5)
GFR calc Af Amer: 51 mL/min — ABNORMAL LOW (ref >90)
GFR calc non Af Amer: 44 mL/min — ABNORMAL LOW (ref >90)
Glucose, Bld: 101 mg/dL — ABNORMAL HIGH (ref 70–99)
Potassium: 4.7 mEq/L (ref 3.5–5.1)
Sodium: 138 mEq/L (ref 135–145)

## 2011-08-22 LAB — CBC
MCV: 91 fL (ref 78.0–100.0)
Platelets: 226 10*3/uL (ref 150–400)
RBC: 4 MIL/uL — ABNORMAL LOW (ref 4.22–5.81)
RDW: 13 % (ref 11.5–15.5)
WBC: 6.3 10*3/uL (ref 4.0–10.5)

## 2011-08-22 LAB — POCT I-STAT TROPONIN I: Troponin i, poc: 0 ng/mL (ref 0.00–0.08)

## 2012-06-26 ENCOUNTER — Inpatient Hospital Stay (HOSPITAL_COMMUNITY)
Admission: EM | Admit: 2012-06-26 | Discharge: 2012-06-29 | DRG: 287 | Disposition: A | Discharge status: Home / Self Care | Payer: Medicare Other | Attending: Cardiovascular Disease | Admitting: Cardiovascular Disease

## 2012-06-26 ENCOUNTER — Encounter (HOSPITAL_COMMUNITY): Payer: Self-pay | Admitting: *Deleted

## 2012-06-26 DIAGNOSIS — Z79899 Other long term (current) drug therapy: Secondary | ICD-10-CM

## 2012-06-26 DIAGNOSIS — I252 Old myocardial infarction: Secondary | ICD-10-CM

## 2012-06-26 DIAGNOSIS — I2582 Chronic total occlusion of coronary artery: Secondary | ICD-10-CM | POA: Diagnosis present

## 2012-06-26 DIAGNOSIS — I2510 Atherosclerotic heart disease of native coronary artery without angina pectoris: Principal | ICD-10-CM | POA: Diagnosis present

## 2012-06-26 DIAGNOSIS — Z9119 Patient's noncompliance with other medical treatment and regimen: Secondary | ICD-10-CM

## 2012-06-26 DIAGNOSIS — E785 Hyperlipidemia, unspecified: Secondary | ICD-10-CM | POA: Diagnosis present

## 2012-06-26 DIAGNOSIS — I129 Hypertensive chronic kidney disease with stage 1 through stage 4 chronic kidney disease, or unspecified chronic kidney disease: Secondary | ICD-10-CM | POA: Diagnosis present

## 2012-06-26 DIAGNOSIS — Z91199 Patient's noncompliance with other medical treatment and regimen due to unspecified reason: Secondary | ICD-10-CM

## 2012-06-26 DIAGNOSIS — Q605 Renal hypoplasia, unspecified: Secondary | ICD-10-CM

## 2012-06-26 DIAGNOSIS — Z720 Tobacco use: Secondary | ICD-10-CM

## 2012-06-26 DIAGNOSIS — Q602 Renal agenesis, unspecified: Secondary | ICD-10-CM

## 2012-06-26 DIAGNOSIS — Y849 Medical procedure, unspecified as the cause of abnormal reaction of the patient, or of later complication, without mention of misadventure at the time of the procedure: Secondary | ICD-10-CM | POA: Diagnosis present

## 2012-06-26 DIAGNOSIS — N183 Chronic kidney disease, stage 3 unspecified: Secondary | ICD-10-CM | POA: Diagnosis present

## 2012-06-26 DIAGNOSIS — I25810 Atherosclerosis of coronary artery bypass graft(s) without angina pectoris: Secondary | ICD-10-CM | POA: Diagnosis present

## 2012-06-26 DIAGNOSIS — Z7902 Long term (current) use of antithrombotics/antiplatelets: Secondary | ICD-10-CM

## 2012-06-26 DIAGNOSIS — I213 ST elevation (STEMI) myocardial infarction of unspecified site: Secondary | ICD-10-CM

## 2012-06-26 DIAGNOSIS — F17200 Nicotine dependence, unspecified, uncomplicated: Secondary | ICD-10-CM | POA: Diagnosis present

## 2012-06-26 DIAGNOSIS — T82897A Other specified complication of cardiac prosthetic devices, implants and grafts, initial encounter: Secondary | ICD-10-CM | POA: Diagnosis present

## 2012-06-26 DIAGNOSIS — I200 Unstable angina: Secondary | ICD-10-CM | POA: Diagnosis present

## 2012-06-26 DIAGNOSIS — Q600 Renal agenesis, unilateral: Secondary | ICD-10-CM

## 2012-06-26 DIAGNOSIS — I249 Acute ischemic heart disease, unspecified: Secondary | ICD-10-CM | POA: Diagnosis present

## 2012-06-26 DIAGNOSIS — Z7982 Long term (current) use of aspirin: Secondary | ICD-10-CM

## 2012-06-26 HISTORY — DX: Essential (primary) hypertension: I10

## 2012-06-26 HISTORY — DX: Hyperlipidemia, unspecified: E78.5

## 2012-06-26 HISTORY — DX: Tobacco use: Z72.0

## 2012-06-26 HISTORY — DX: Chronic kidney disease, stage 3 (moderate): N18.3

## 2012-06-26 HISTORY — DX: Acute ischemic heart disease, unspecified: I24.9

## 2012-06-26 HISTORY — DX: ST elevation (STEMI) myocardial infarction of unspecified site: I21.3

## 2012-06-26 HISTORY — DX: Acute myocardial infarction, unspecified: I21.9

## 2012-06-26 HISTORY — DX: Renal agenesis, unilateral: Q60.0

## 2012-06-26 HISTORY — DX: Atherosclerotic heart disease of native coronary artery without angina pectoris: I25.10

## 2012-06-26 MED ORDER — SODIUM CHLORIDE 0.9 % IV SOLN
INTRAVENOUS | Status: DC
  Administered 2012-06-26: 1000 mL via INTRAVENOUS

## 2012-06-26 MED ORDER — ASPIRIN 81 MG PO CHEW
324.0000 mg | CHEWABLE_TABLET | Freq: Once | ORAL | Status: AC
  Administered 2012-06-26: 324 mg via ORAL
  Filled 2012-06-26: qty 4

## 2012-06-26 MED ORDER — HEPARIN SODIUM (PORCINE) 5000 UNIT/ML IJ SOLN
4000.0000 [IU] | INTRAMUSCULAR | Status: AC
  Administered 2012-06-26: 4000 [IU] via INTRAVENOUS
  Filled 2012-06-26: qty 1

## 2012-06-26 NOTE — ED Notes (Signed)
Dr. Bednar at bedside. 

## 2012-06-26 NOTE — ED Notes (Signed)
Patient was walking out from work and started with chest pain.  Stated he has had 4 or 5 heart attacks in the past and has 4 stents.  Denies SOB, diaphoresis, etc.

## 2012-06-26 NOTE — ED Notes (Signed)
Pt with hx of 5 stents and 3 bipasses to ED c/o acute onset chest pain and L arm numbess around 2310 when he was getting into car.  Denies nausea or diaphoresis.  Pt took 1 0.4 mg sl nitro with no relief.

## 2012-06-27 ENCOUNTER — Encounter (HOSPITAL_COMMUNITY): Payer: Self-pay | Admitting: Cardiology

## 2012-06-27 ENCOUNTER — Ambulatory Visit (HOSPITAL_COMMUNITY): Admit: 2012-06-27 | Payer: Self-pay | Admitting: Cardiovascular Disease

## 2012-06-27 ENCOUNTER — Encounter (HOSPITAL_COMMUNITY)
Admission: EM | Disposition: A | Discharge status: Home / Self Care | Payer: Self-pay | Source: Home / Self Care | Attending: Cardiovascular Disease

## 2012-06-27 ENCOUNTER — Emergency Department (HOSPITAL_COMMUNITY): Payer: Medicare Other

## 2012-06-27 DIAGNOSIS — I213 ST elevation (STEMI) myocardial infarction of unspecified site: Secondary | ICD-10-CM

## 2012-06-27 DIAGNOSIS — Z720 Tobacco use: Secondary | ICD-10-CM | POA: Diagnosis present

## 2012-06-27 DIAGNOSIS — I2510 Atherosclerotic heart disease of native coronary artery without angina pectoris: Secondary | ICD-10-CM | POA: Diagnosis present

## 2012-06-27 DIAGNOSIS — Q600 Renal agenesis, unilateral: Secondary | ICD-10-CM

## 2012-06-27 DIAGNOSIS — I25810 Atherosclerosis of coronary artery bypass graft(s) without angina pectoris: Secondary | ICD-10-CM | POA: Diagnosis present

## 2012-06-27 DIAGNOSIS — N183 Chronic kidney disease, stage 3 unspecified: Secondary | ICD-10-CM

## 2012-06-27 DIAGNOSIS — E785 Hyperlipidemia, unspecified: Secondary | ICD-10-CM

## 2012-06-27 DIAGNOSIS — I249 Acute ischemic heart disease, unspecified: Secondary | ICD-10-CM

## 2012-06-27 HISTORY — DX: Renal agenesis, unilateral: Q60.0

## 2012-06-27 HISTORY — DX: Tobacco use: Z72.0

## 2012-06-27 HISTORY — DX: Chronic kidney disease, stage 3 unspecified: N18.30

## 2012-06-27 HISTORY — DX: Acute ischemic heart disease, unspecified: I24.9

## 2012-06-27 HISTORY — PX: LEFT HEART CATH: SHX5478

## 2012-06-27 HISTORY — DX: Hyperlipidemia, unspecified: E78.5

## 2012-06-27 HISTORY — DX: Atherosclerotic heart disease of native coronary artery without angina pectoris: I25.10

## 2012-06-27 HISTORY — DX: ST elevation (STEMI) myocardial infarction of unspecified site: I21.3

## 2012-06-27 LAB — CBC
HCT: 43.8 % (ref 39.0–52.0)
MCHC: 35.2 g/dL (ref 30.0–36.0)
MCV: 92.8 fL (ref 78.0–100.0)
MCV: 93 fL (ref 78.0–100.0)
Platelets: 187 10*3/uL (ref 150–400)
Platelets: 198 10*3/uL (ref 150–400)
RDW: 13.8 % (ref 11.5–15.5)
RDW: 13.8 % (ref 11.5–15.5)
WBC: 7.3 10*3/uL (ref 4.0–10.5)
WBC: 9.5 10*3/uL (ref 4.0–10.5)

## 2012-06-27 LAB — COMPREHENSIVE METABOLIC PANEL
ALT: 9 U/L (ref 0–53)
AST: 16 U/L (ref 0–37)
Albumin: 3.4 g/dL — ABNORMAL LOW (ref 3.5–5.2)
Alkaline Phosphatase: 67 U/L (ref 39–117)
Chloride: 105 mEq/L (ref 96–112)
Potassium: 3.9 mEq/L (ref 3.5–5.1)
Sodium: 138 mEq/L (ref 135–145)
Total Bilirubin: 0.5 mg/dL (ref 0.3–1.2)
Total Protein: 7.3 g/dL (ref 6.0–8.3)

## 2012-06-27 LAB — POCT I-STAT, CHEM 8
BUN: 15 mg/dL (ref 6–23)
Chloride: 108 mEq/L (ref 96–112)
Potassium: 4 mEq/L (ref 3.5–5.1)
Sodium: 142 mEq/L (ref 135–145)
TCO2: 22 mmol/L (ref 0–100)

## 2012-06-27 LAB — LIPID PANEL
Cholesterol: 239 mg/dL — ABNORMAL HIGH (ref 0–200)
HDL: 37 mg/dL — ABNORMAL LOW (ref >39)
Total CHOL/HDL Ratio: 6.5 RATIO

## 2012-06-27 LAB — MRSA PCR SCREENING: MRSA by PCR: NEGATIVE

## 2012-06-27 LAB — CK TOTAL AND CKMB (NOT AT ARMC): Total CK: 126 U/L (ref 7–232)

## 2012-06-27 LAB — TSH: TSH: 0.833 u[IU]/mL (ref 0.350–4.500)

## 2012-06-27 LAB — BASIC METABOLIC PANEL
Chloride: 105 mEq/L (ref 96–112)
Creatinine, Ser: 1.61 mg/dL — ABNORMAL HIGH (ref 0.50–1.35)
GFR calc Af Amer: 48 mL/min — ABNORMAL LOW (ref >90)
Potassium: 4.5 mEq/L (ref 3.5–5.1)
Sodium: 136 mEq/L (ref 135–145)

## 2012-06-27 LAB — POCT I-STAT TROPONIN I

## 2012-06-27 SURGERY — LEFT HEART CATH
Anesthesia: LOCAL

## 2012-06-27 MED ORDER — RANOLAZINE ER 500 MG PO TB12
500.0000 mg | ORAL_TABLET | Freq: Two times a day (BID) | ORAL | Status: DC
  Administered 2012-06-27: 500 mg via ORAL
  Filled 2012-06-27 (×4): qty 1

## 2012-06-27 MED ORDER — HEPARIN (PORCINE) IN NACL 2-0.9 UNIT/ML-% IJ SOLN
INTRAMUSCULAR | Status: AC
  Filled 2012-06-27: qty 2000

## 2012-06-27 MED ORDER — ACETAMINOPHEN 325 MG PO TABS: 650.0000 mg | ORAL_TABLET | ORAL | Status: DC | PRN

## 2012-06-27 MED ORDER — SIMVASTATIN 80 MG PO TABS
80.0000 mg | ORAL_TABLET | Freq: Every day | ORAL | Status: DC
  Filled 2012-06-27: qty 1

## 2012-06-27 MED ORDER — ASPIRIN 81 MG PO CHEW: 81.0000 mg | CHEWABLE_TABLET | Freq: Every day | ORAL | Status: DC

## 2012-06-27 MED ORDER — NITROGLYCERIN 0.2 MG/ML ON CALL CATH LAB
INTRAVENOUS | Status: AC
  Filled 2012-06-27: qty 1

## 2012-06-27 MED ORDER — ONDANSETRON HCL 4 MG/2ML IJ SOLN: 4.0000 mg | Freq: Four times a day (QID) | INTRAMUSCULAR | Status: DC | PRN

## 2012-06-27 MED ORDER — ONDANSETRON HCL 4 MG/2ML IJ SOLN: 4.0000 mg | Freq: Once | INTRAMUSCULAR | Status: DC

## 2012-06-27 MED ORDER — SODIUM CHLORIDE 0.9 % IV SOLN
1000.0000 mL | Freq: Once | INTRAVENOUS | Status: AC
  Administered 2012-06-27: 500 mL via INTRAVENOUS

## 2012-06-27 MED ORDER — NITROGLYCERIN IN D5W 200-5 MCG/ML-% IV SOLN
10.0000 ug/min | INTRAVENOUS | Status: DC
  Administered 2012-06-27: 30 ug/min via INTRAVENOUS
  Administered 2012-06-27: 10 ug/min via INTRAVENOUS
  Filled 2012-06-27: qty 250

## 2012-06-27 MED ORDER — MIDAZOLAM HCL 2 MG/2ML IJ SOLN
INTRAMUSCULAR | Status: AC
  Filled 2012-06-27: qty 2

## 2012-06-27 MED ORDER — GUAIFENESIN ER 600 MG PO TB12
1200.0000 mg | ORAL_TABLET | Freq: Two times a day (BID) | ORAL | Status: DC
  Administered 2012-06-28 – 2012-06-29 (×2): 1200 mg via ORAL
  Filled 2012-06-27 (×6): qty 2

## 2012-06-27 MED ORDER — HEPARIN (PORCINE) IN NACL 100-0.45 UNIT/ML-% IJ SOLN
12.0000 [IU]/kg/h | INTRAMUSCULAR | Status: DC
  Administered 2012-06-27: 12 [IU]/kg/h via INTRAVENOUS
  Filled 2012-06-27: qty 250

## 2012-06-27 MED ORDER — LIDOCAINE HCL (PF) 1 % IJ SOLN
INTRAMUSCULAR | Status: AC
  Filled 2012-06-27: qty 30

## 2012-06-27 MED ORDER — SODIUM CHLORIDE 0.9 % IV SOLN
INTRAVENOUS | Status: DC
  Administered 2012-06-27: 03:00:00 via INTRAVENOUS

## 2012-06-27 MED ORDER — MORPHINE SULFATE 4 MG/ML IJ SOLN
3.0000 mg | Freq: Once | INTRAMUSCULAR | Status: AC
  Administered 2012-06-27: 3 mg via INTRAVENOUS

## 2012-06-27 MED ORDER — SODIUM CHLORIDE 0.9 % IV SOLN: INTRAVENOUS | Status: DC

## 2012-06-27 MED ORDER — SIMVASTATIN 40 MG PO TABS
40.0000 mg | ORAL_TABLET | Freq: Every day | ORAL | Status: DC
  Filled 2012-06-27: qty 1

## 2012-06-27 MED ORDER — MORPHINE SULFATE 2 MG/ML IJ SOLN: 2.0000 mg | INTRAMUSCULAR | Status: DC | PRN

## 2012-06-27 MED ORDER — HEPARIN (PORCINE) IN NACL 100-0.45 UNIT/ML-% IJ SOLN
1500.0000 [IU]/h | INTRAMUSCULAR | Status: DC
  Administered 2012-06-27: 1000 [IU]/h via INTRAVENOUS
  Administered 2012-06-28: 1400 [IU]/h via INTRAVENOUS
  Filled 2012-06-27 (×6): qty 250

## 2012-06-27 MED ORDER — ISOSORBIDE MONONITRATE ER 60 MG PO TB24
60.0000 mg | ORAL_TABLET | Freq: Every day | ORAL | Status: DC
  Administered 2012-06-27 – 2012-06-29 (×3): 60 mg via ORAL
  Filled 2012-06-27 (×3): qty 1

## 2012-06-27 MED ORDER — FENTANYL CITRATE 0.05 MG/ML IJ SOLN
INTRAMUSCULAR | Status: AC
  Filled 2012-06-27: qty 2

## 2012-06-27 MED ORDER — SODIUM CHLORIDE 0.9 % IV SOLN
INTRAVENOUS | Status: DC
  Administered 2012-06-27: 75 mL/h via INTRAVENOUS

## 2012-06-27 MED ORDER — NITROGLYCERIN IN D5W 200-5 MCG/ML-% IV SOLN: 3.0000 ug/min | INTRAVENOUS | Status: DC

## 2012-06-27 MED ORDER — NITROGLYCERIN IN D5W 200-5 MCG/ML-% IV SOLN
3.0000 ug/min | INTRAVENOUS | Status: DC
  Administered 2012-06-27: 30 ug/min via INTRAVENOUS

## 2012-06-27 MED ORDER — DEXTROMETHORPHAN POLISTIREX 30 MG/5ML PO LQCR
30.0000 mg | Freq: Two times a day (BID) | ORAL | Status: DC
  Administered 2012-06-28 – 2012-06-29 (×2): 30 mg via ORAL
  Filled 2012-06-27 (×6): qty 5

## 2012-06-27 MED ORDER — MORPHINE SULFATE 4 MG/ML IJ SOLN
INTRAMUSCULAR | Status: AC
  Administered 2012-06-27: 3 mg via INTRAVENOUS
  Filled 2012-06-27: qty 1

## 2012-06-27 MED ORDER — NITROGLYCERIN 0.4 MG SL SUBL: 0.4000 mg | SUBLINGUAL_TABLET | SUBLINGUAL | Status: DC | PRN

## 2012-06-27 MED ORDER — ACETAMINOPHEN 325 MG PO TABS
650.0000 mg | ORAL_TABLET | ORAL | Status: DC | PRN
  Filled 2012-06-27: qty 2

## 2012-06-27 MED ORDER — CLOPIDOGREL BISULFATE 75 MG PO TABS
75.0000 mg | ORAL_TABLET | Freq: Every day | ORAL | Status: DC
  Administered 2012-06-27 – 2012-06-29 (×3): 75 mg via ORAL
  Filled 2012-06-27 (×4): qty 1

## 2012-06-27 MED ORDER — NITROGLYCERIN 0.4 MG SL SUBL
0.4000 mg | SUBLINGUAL_TABLET | SUBLINGUAL | Status: DC | PRN
  Filled 2012-06-27: qty 25

## 2012-06-27 MED ORDER — ASPIRIN EC 81 MG PO TBEC
81.0000 mg | DELAYED_RELEASE_TABLET | Freq: Every day | ORAL | Status: DC
  Administered 2012-06-28 – 2012-06-29 (×2): 81 mg via ORAL
  Filled 2012-06-27 (×2): qty 1

## 2012-06-27 MED ORDER — METOPROLOL TARTRATE 25 MG PO TABS
25.0000 mg | ORAL_TABLET | Freq: Two times a day (BID) | ORAL | Status: DC
  Administered 2012-06-27 – 2012-06-29 (×5): 25 mg via ORAL
  Filled 2012-06-27 (×9): qty 1

## 2012-06-27 MED ORDER — FENTANYL CITRATE 0.05 MG/ML IJ SOLN
50.0000 ug | Freq: Once | INTRAMUSCULAR | Status: AC
  Administered 2012-06-27: 50 ug via INTRAVENOUS
  Filled 2012-06-27: qty 2

## 2012-06-27 NOTE — CV Procedure (Signed)
Emergency Cardiac catherization;  2:30 am  Full note dictated; see diagram in chart.  Alias Christopher Wilson, 71 y.Christopher., male  DICTATION # [phone], [id number]  No significant change from prior cath. Rec: Medical therapy.  Apparently pt was not consistentluy taking his med an had stopped p[lavix, statin,ranexa and had resumed smoking. Smoking cessation counsel.  Troy Sine, MD, [facility] 06/27/2012 9:06 AM

## 2012-06-27 NOTE — Progress Notes (Signed)
Subjective: Chest soreness only, + cough, pain/soreness does not increase with cough.  Objective: Vital signs in last 24 hours: Temp:  [97.5 F (36.4 C)-98.1 F (36.7 C)] 97.5 F (36.4 C) (08/30 0800) Pulse Rate:  [52-85] 52  (08/30 0800) Resp:  [11-22] 11  (08/30 0800) BP: (119-163)/(65-86) 119/69 mmHg (08/30 0800) SpO2:  [94 %-100 %] 98 % (08/30 0800) Weight:  [83.915 kg (185 lb)-87.091 kg (192 lb)] 84.3 kg (185 lb 13.6 oz) (08/30 0300) Weight change:    Intake/Output from previous day: +57 08/29 0701 - 08/30 0700 In: 76 [I.V.:76] Out: -  Intake/Output this shift:    PE: General:alert and oriented, MAE Heart:S1S2 RRR Lungs:+ wheeze, exp. Abd:+ BS, soft, non tender Ext:no edema.  + pedal pulse    Lab Results:  Basename 06/27/12 0324 06/27/12 0002 06/26/12 2340  WBC 7.3 -- 9.5  HGB 13.9 15.6 --  HCT 41.3 46.0 --  PLT 187 -- 198   BMET  Basename 06/27/12 0324 06/27/12 0002 06/26/12 2340  NA 136 142 --  K 4.5 4.0 --  CL 105 108 --  CO2 22 -- 24  GLUCOSE 139* 116* --  BUN 14 15 --  CREATININE 1.61* 1.90* --  CALCIUM 8.8 -- 9.8    Basename 06/27/12 0324  TROPONINI <0.30    Lab Results  Component Value Date   CHOL 239* 06/27/2012   HDL 37* 06/27/2012   LDLCALC 175* 06/27/2012   TRIG 135 06/27/2012   CHOLHDL 6.5 06/27/2012   Lab Results  Component Value Date   HGBA1C  Value: 5.8 (NOTE) The ADA recommends the following therapeutic goal for glycemic control related to Hgb A1c measurement: Goal of therapy: <6.5 Hgb A1c  Reference: American Diabetes Association: Clinical Practice Recommendations 2010, Diabetes Care, 2010, 33: (Suppl  1). 10/06/2009       Hepatic Function Panel  Basename 06/26/12 2340  PROT 7.3  ALBUMIN 3.4*  AST 16  ALT 9  ALKPHOS 67  BILITOT 0.5  BILIDIR --  IBILI --    Basename 06/27/12 0324  CHOL 239*   No results found for this basename: PROTIME in the last 72 hours    EKG: Orders placed during the hospital encounter of  06/26/12  . ED EKG  . ED EKG  . ED EKG  . ED EKG  . EKG 12-LEAD  . EKG 12-LEAD  . EKG 12-LEAD  . EKG 12-LEAD  . EKG 12-LEAD  . EKG 12-LEAD  . EKG 12-LEAD    Studies/Results: Dg Chest Portable 1 View  06/27/2012  *RADIOLOGY REPORT*  Clinical Data: Chest pain.  PORTABLE CHEST - 1 VIEW  Comparison: PA and lateral chest 08/22/2011.  Findings: The patient is status post CABG.  Heart size is normal. No pneumothorax or pleural fluid.  Lungs are clear.  IMPRESSION: No acute disease.   Original Report Authenticated By: Arvid Right. Luther Parody, M.D.     Medications: I have reviewed the patient's current medications.    . sodium chloride  1,000 mL Intravenous Once  . aspirin  324 mg Oral Once  . aspirin EC  81 mg Oral Daily  . clopidogrel  75 mg Oral Q breakfast  . fentaNYL      . fentaNYL  50 mcg Intravenous Once  . heparin      . heparin  4,000 Units Intravenous STAT  . lidocaine      . metoprolol tartrate  25 mg Oral BID  . midazolam      .  morphine injection  3 mg Intravenous Once  . nitroGLYCERIN      . ondansetron (ZOFRAN) IV  4 mg Intravenous Once  . ranolazine  500 mg Oral BID  . simvastatin  40 mg Oral q1800  . DISCONTD: aspirin  81 mg Oral Daily  . DISCONTD: simvastatin  80 mg Oral q1800   Assessment/Plan: Principal Problem:  *ACS (acute coronary syndrome), with ST elevation but stable coronary arteries on cath 06/27/12 Active Problems:  CAD (coronary artery disease) of artery bypass graft  CAD (coronary artery disease), with CABG 1989 and re-do Cabg 2002 X 3 vesssels.  progressive disease with Stent to Lt. Main, and 1st diag 2006, stent to VG to LCX 2007, with known occlusion history o   Congenital single kidney  CKD (chronic kidney disease) stage 3, GFR 30-59 ml/min  Hyperlipidemia LDL goal < 70  Tobacco abuse  PLAN: negative Troponin.  Cholesterol elevated for pt to be on 80 of zocor at home.  ? What meds was he taking? Have asked pharmacy to help with home meds. Have  decreased statin with addition of Ranexa.  Holding ACE secondary to solitary kidney with cath dye. NTG continues at 30 mcgs.  IV Heparin continues.   Pulse down to 52 with Lopressor.    EKG now with inverted t waves in leads 2, 3, AVF and V6.  Was not taking zocor frequently. Was on Imdur also.    Wean IV NTG and begin IMDUR at 60 mg     LOS: 1 day   INGOLD,LAURA R 06/27/2012, 8:34 AM   Patient seen and examined. Agree with assessment and plan.  No further chest pain. ECG with old inferior Q waves, no J point elevation or ST elevation. Pt admits that he had not been taking his medicines recently, including plavix. LDL 175; will change simvastatin to atorvastain at 80 mg for max effect. Wean IV NTG and start imdur. Back on Ranexa. Continue to hydrate with solitary kidney post cath. Cr today is improved. Smoking cessation discussed.   Troy Sine, MD, [facility] 06/27/2012 9:00 AM

## 2012-06-27 NOTE — Progress Notes (Signed)
ANTICOAGULATION CONSULT NOTE - Follow-up Consult  Pharmacy Consult for heparin Indication: chest pain/ACS  No Known Allergies  Patient Measurements: Height: 5\' 10"  (177.8 cm) Weight: 185 lb 13.6 oz (84.3 kg) IBW/kg (Calculated) : 73  Heparin Dosing Weight: 84kg  Vital Signs: Temp: 98.2 F (36.8 C) (08/30 1200) BP: 133/74 mmHg (08/30 1200) Pulse Rate: 50  (08/30 1200)  Labs:  Basename 06/27/12 1538 06/27/12 1331 06/27/12 0905 06/27/12 0324 06/27/12 0002 06/26/12 2340  HGB -- -- -- 13.9 15.6 --  HCT -- -- -- 41.3 46.0 43.8  PLT -- -- -- 187 -- 198  APTT -- -- -- -- -- 27  LABPROT -- -- -- -- -- 12.8  INR -- -- -- -- -- 0.94  HEPARINUNFRC 0.15* -- -- -- -- --  CREATININE -- -- -- 1.61* 1.90* 1.72*  CKTOTAL -- -- -- 126 -- --  CKMB -- -- -- 5.6* -- --  TROPONINI -- 7.02* 4.90* <0.30 -- --    Estimated Creatinine Clearance: 44.1 ml/min (by C-G formula based on Cr of 1.61).   Medical History: Past Medical History  Diagnosis Date  . Coronary artery disease   . Hypertension   . Myocardial infarction   . STEMI (ST elevation myocardial infarction) 06/27/2012  . CAD (coronary artery disease), with CABG 1989 and re-do Cabg 2002 X 3 vesssels.  progressive disease with Stent to Lt. Main, and 1st diag 2006, stent to VG to LCX 2007, with known occlusion history o  06/27/2012  . 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 Hyperlipidemia LDL goal < 70 06/27/2012  . Tobacco abuse 06/27/2012  . CKD (chronic kidney disease) stage 3, GFR 30-59 ml/min 06/27/2012  . ACS (acute coronary syndrome), with ST elevation but stable coronary arteries on cath 06/27/12 06/27/2012    Medications:  Prescriptions prior to admission  Medication Sig Dispense Refill  . aspirin 81 MG chewable tablet Chew 81 mg by mouth daily.      . clopidogrel (PLAVIX) 75 MG tablet Take 75 mg by mouth daily.      . metoprolol tartrate (LOPRESSOR) 25 MG tablet Take 25 mg by mouth 2 (two) times daily.      . nitroGLYCERIN  (NITROSTAT) 0.4 MG SL tablet Place 0.4 mg under the tongue every 5 (five) minutes as needed. For chest pain      . simvastatin (ZOCOR) 80 MG tablet Take 80 mg by mouth at bedtime.       Scheduled:     . sodium chloride  1,000 mL Intravenous Once  . aspirin  324 mg Oral Once  . aspirin EC  81 mg Oral Daily  . clopidogrel  75 mg Oral Q breakfast  . dextromethorphan  30 mg Oral BID  . fentaNYL      . fentaNYL  50 mcg Intravenous Once  . guaiFENesin  1,200 mg Oral BID  . heparin      . heparin  4,000 Units Intravenous STAT  . isosorbide mononitrate  60 mg Oral Daily  . lidocaine      . metoprolol tartrate  25 mg Oral BID  . midazolam      .  morphine injection  3 mg Intravenous Once  . nitroGLYCERIN      . ondansetron (ZOFRAN) IV  4 mg Intravenous Once  . ranolazine  500 mg Oral BID  . DISCONTD: aspirin  81 mg Oral Daily  . DISCONTD: simvastatin  40 mg Oral q1800  . DISCONTD: simvastatin  80 mg Oral  q1800    Assessment: 71yo male with significant cardiac hx c/o midsternal CP, found with ST elevation but pain dropped with heparin and NTG with resolution of ST elevation, then had recurrent CP and sent emergently to cath lab, heparin started 4hr after sheath removal, earlier today.    Current heparin level less than goal.  No complications noted.  Goal of Therapy:  Heparin level 0.3-0.7 units/ml Monitor platelets by anticoagulation protocol: Yes   Plan:  - Increase heparin to 1250 units/hr (no bolus 2/2 recent cath) - Check 8h heparin level - Continue daily heparin level and CBC  Dorwin Fitzhenry L. Amada Jupiter, PharmD, [place] Clinical Pharmacist Pager: [phone] Pharmacy: [phone] 06/27/2012 4:55 PM

## 2012-06-27 NOTE — Progress Notes (Signed)
*  PRELIMINARY RESULTS* Echocardiogram 2D Echocardiogram has been performed.  Christopher Wilson 06/27/2012, 10:35 AM

## 2012-06-27 NOTE — ED Provider Notes (Signed)
History     CSN: [id number]  Arrival date & time 06/26/12  2334   First MD Initiated Contact with Patient 06/26/12 2348      Chief Complaint  Patient presents with  . Code STEMI    sternal   Cards: Rollene Fare  (Consider location/radiation/quality/duration/timing/severity/associated sxs/prior treatment) HPI This 71 year old male was walking out to his car tonight from work when he developed the acute onset of chest pressure somewhat like prior heart attack with left upper arm numbness which was slight as well as slight nausea. He no lightheadedness no syncope no palpitations no shortness of breath no abdominal pain or back pain and no radiation of this slight chest pressure tightness feeling. He is no sharp stabbing or pleuritic or positional pain. There is no treatment prior to arrival. His symptoms been present for 20-30 minutes prior to arrival. He has not had any recent exertional anginal type symptoms. His chest pressure is mild to moderate. He did not take aspirin within 12 hours prior to arrival. He states he has had multiple prior heart attacks, stents, and CABG. Past Medical History  Diagnosis Date  . Coronary artery disease   . Hypertension   . Myocardial infarction   . STEMI (ST elevation myocardial infarction) 06/27/2012  . CAD (coronary artery disease), with CABG 1989 and re-do Cabg 2002 X 3 vesssels.  progressive disease with Stent to Lt. Main, and 1st diag 2006, stent to VG to LCX 2007, with known occlusion history o  06/27/2012  . 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 Hyperlipidemia LDL goal < 70 06/27/2012  . Tobacco abuse 06/27/2012  . CKD (chronic kidney disease) stage 3, GFR 30-59 ml/min 06/27/2012  . ACS (acute coronary syndrome), with ST elevation but stable coronary arteries on cath 06/27/12 06/27/2012    Past Surgical History  Procedure Date  . Coronary angioplasty with stent placement   . Triple bipass     Hx of CABG and RE do  . Coratid arterty surgery   .  Coronary artery bypass graft   . Vascular surgery   . Lcea     History reviewed. No pertinent family history.  History  Substance Use Topics  . Smoking status: Current Everyday Smoker -- 1.5 packs/day  . Smokeless tobacco: Never Used  . Alcohol Use: 6.0 oz/week    10 Cans of beer per week      Review of Systems 10 Systems reviewed and are negative for acute change except as noted in the HPI. Allergies  Review of patient's allergies indicates no known allergies.  Home Medications  No current outpatient prescriptions on file.  BP 122/57  Pulse 69  Temp 98 F (36.7 C) (Oral)  Resp 18  Ht 5\' 10"  (1.778 m)  Wt 185 lb 13.6 oz (84.3 kg)  BMI 26.67 kg/m2  SpO2 96%  Physical Exam  Nursing note and vitals reviewed. Constitutional:       Awake, alert, nontoxic appearance.  HENT:  Head: Atraumatic.  Eyes: Right eye exhibits no discharge. Left eye exhibits no discharge.  Neck: Neck supple.  Cardiovascular: Normal rate and regular rhythm.   No murmur heard. Pulmonary/Chest: Effort normal and breath sounds normal. No respiratory distress. He has no wheezes. He has no rales. He exhibits no tenderness.  Abdominal: Soft. There is no tenderness. There is no rebound.  Musculoskeletal: He exhibits no edema and no tenderness.       Baseline ROM, no obvious new focal weakness.  Neurological: He is alert.  Mental status and motor strength appears baseline for patient and situation.  Skin: No rash noted.  Psychiatric: He has a normal mood and affect.    ED Course  Procedures (including critical care time) Code STEMI called in ED Triage (as soon as prior ECG obtained for comparison since Pt's ECG from tonight had inferior Q waves) since even though old inf Q waves, Pt with new inferior ~33mm ST elevation, however upon response by [facility] to Code STEMI, review of most recent cath reports with [facility] Dr. Claiborne Billings feel Pt unlikely to benefit from emergent PCI tonight, initial trop neg, repeat  ECG when Pt brought to Pod D ED bed showed resolution of ST elevation inferior leads, Pt with mild-moderate pain, decision made to treat medically and Pt understands and agrees, [facility] will admit.0020  Initial ECG at 2335: Normal sinus rhythm, ventricular rate 90, normal axis, normal intervals, inferior Q waves, approximately 1 mm ST segment elevation inferior leads, compared with October 2012 inferior Q waves still present but inferior ST segment elevation appears new  Repeat ECG at 0002: Sinus rhythm, ventricular rate 80, normal axis, normal intervals, inferior Q waves present, ST segment elevation inferior leads no longer appears evident  Recheck 0045 still mild discomfort, pulse 70s, SBP 140s, Pt has single kidney and CKD.  Repeat ECG at 0058: Normal sinus rhythm, ventricular rate 76, normal axis, normal intervals, inferior lead Q waves present, ST segment elevation has returned in the inferior leads  0115- [facility] NP has been with the Pt since prior to the 3rd ECG, Pt developed worsening pain in ED despite medical treatment and 3rd ECG showed return of inferior ST elevation so per [facility] NP, Dr. Claiborne Billings will take Pt to Cath and [facility] re-calling Code STEMI for Cath Lab team response.  CRITICAL CARE Performed by: Babette Relic   Total critical care time: 61min  Critical care time was exclusive of separately billable procedures and treating other patients.  Critical care was necessary to treat or prevent imminent or life-threatening deterioration.  Critical care was time spent personally by me on the following activities: development of treatment plan with patient and/or surrogate as well as nursing, discussions with consultants, evaluation of patient's response to treatment, examination of patient, obtaining history from patient or surrogate, ordering and performing treatments and interventions, ordering and review of laboratory studies, ordering and review of radiographic studies, pulse oximetry  and re-evaluation of patient's condition.  Labs Reviewed  COMPREHENSIVE METABOLIC PANEL - Abnormal; Notable for the following:    Glucose, Bld 122 (*)     Creatinine, Ser 1.72 (*)     Albumin 3.4 (*)     GFR calc non Af Amer 39 (*)     GFR calc Af Amer 45 (*)     All other components within normal limits  POCT I-STAT, CHEM 8 - Abnormal; Notable for the following:    Creatinine, Ser 1.90 (*)     Glucose, Bld 116 (*)     All other components within normal limits  CK TOTAL AND CKMB - Abnormal; Notable for the following:    CK, MB 5.6 (*)     Relative Index 4.4 (*)     All other components within normal limits  TROPONIN I - Abnormal; Notable for the following:    Troponin I 4.90 (*)     All other components within normal limits  TROPONIN I - Abnormal; Notable for the following:    Troponin I 7.02 (*)  All other components within normal limits  LIPID PANEL - Abnormal; Notable for the following:    Cholesterol 239 (*)     HDL 37 (*)     LDL Cholesterol 175 (*)     All other components within normal limits  BASIC METABOLIC PANEL - Abnormal; Notable for the following:    Glucose, Bld 139 (*)     Creatinine, Ser 1.61 (*)     GFR calc non Af Amer 42 (*)     GFR calc Af Amer 48 (*)     All other components within normal limits  HEPARIN LEVEL (UNFRACTIONATED) - Abnormal; Notable for the following:    Heparin Unfractionated 0.15 (*)     All other components within normal limits  BASIC METABOLIC PANEL - Abnormal; Notable for the following:    Glucose, Bld 114 (*)     Creatinine, Ser 1.41 (*)     GFR calc non Af Amer 49 (*)     GFR calc Af Amer 57 (*)     All other components within normal limits  HEMOGLOBIN A1C - Abnormal; Notable for the following:    Hemoglobin A1C 5.8 (*)     Mean Plasma Glucose 120 (*)     All other components within normal limits  HEPARIN LEVEL (UNFRACTIONATED) - Abnormal; Notable for the following:    Heparin Unfractionated 0.26 (*)     All other  components within normal limits  TROPONIN I - Abnormal; Notable for the following:    Troponin I 16.56 (*)     All other components within normal limits  CBC  APTT  PROTIME-INR  POCT I-STAT TROPONIN I  TROPONIN I  MAGNESIUM  TSH  CBC  MRSA PCR SCREENING  CBC  POCT ACTIVATED CLOTTING TIME  HEPARIN LEVEL (UNFRACTIONATED)  CBC  BASIC METABOLIC PANEL  HEPARIN LEVEL (UNFRACTIONATED)   Dg Chest Portable 1 View  06/27/2012  *RADIOLOGY REPORT*  Clinical Data: Chest pain.  PORTABLE CHEST - 1 VIEW  Comparison: PA and lateral chest 08/22/2011.  Findings: The patient is status post CABG.  Heart size is normal. No pneumothorax or pleural fluid.  Lungs are clear.  IMPRESSION: No acute disease.   Original Report Authenticated By: Arvid Right. D'ALESSIO, M.D.      1. ST elevation MI (STEMI)   2. Tobacco abuse       MDM          Babette Relic, MD 06/28/12 1730

## 2012-06-27 NOTE — H&P (Signed)
Christopher Wilson is an 71 y.o. male.   Cardiologist: Dr. Rollene Fare Chief Complaint: chest pain that began after 2300, sudden onset  HPI: 71 year old white, divorced, male presented to the emergency room tonight with sudden onset of chest pain midsternal, there is no radiation, no nausea, vomiting, shortness of breath and no diaphoresis. He took one nitroglycerin at home without relief he came to the emergency room and nitroglycerin sublingual has not helped. Originally he had ST elevation of 1/2-1 mm in leads 2,3 and aVF but with IV heparin and IV nitroglycerin his pain dropped from 4-5 to 3/10.  EKG improved with resolution of the ST elevation. There within the next hour the pain returned it was 6/10 and new EKG revealed return of ST elevation.  Unfortunately Christopher Wilson has significant history of coronary disease had remote PCI done in the 80s to 90s he had his first bypass grafting in 1989 and then with an MI he had a repeat CABG x3 vessels in 2002 by Dr. Prescott Gum.  Due to progressive disease he has been stented in his left main and first diagonal in 2006. Has also had a stent to the vein graft to the circumflex in 2007 no a repeat cardiac cath this has been found to be occluded. His last cardiac cath was 2010 EF at that time was 35-40%. Severe native coronary disease with a widely patent left main stent and widely patent stent in the diagonal branch of the LAD with total occlusion of the LAD beyond the diagonal takeoff. Also total occlusion of the proximal circumflex total occlusion of the native RCA at the ostium. And patent vein graft to the PDA he has close occlusion of the previously stented vein graft to the circumflex he also had a patent left internal mammary artery to the mid LAD medical therapy was recommended.  With this third EKG and return of ST elevation And increased chest pain despite IV heparin and IV nitroglycerin, patient is going emergently to the cardiac cath lab.  Please note patient  has a congenital solitary kidney creatinine is elevated at 1.9 he is receiving a 500 cc bolus of saline and then the infusion at 75 an hour.  Last cardiolite 08/2010 was low risk and no ischemia.  He had been on Ranexa at one point but was unable to afford.    Other History of LCEA.      Past Medical History  Diagnosis Date  . Coronary artery disease   . Hypertension   . Myocardial infarction   . STEMI (ST elevation myocardial infarction) 06/27/2012  . CAD (coronary artery disease), with CABG 1989 and re-do Cabg 2002 X 3 vesssels.  progressive disease with Stent to Lt. Main, and 1st diag 2006, stent to VG to LCX 2007, with known occlusion history o  06/27/2012  . 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 CKD (chronic kidney disease) stage 3, GFR 30-59 ml/min 06/27/2012    Past Surgical History  Procedure Date  . Coronary angioplasty with stent placement   . Triple bipass   . Coratid arterty surgery   . Coronary artery bypass graft     History reviewed. No pertinent family history. Not significant to this admit.  Social History:  reports that he has been smoking.  He has never used smokeless tobacco. He reports that he drinks about 6 ounces of alcohol per week. He reports that he does not use illicit drugs. Divorced, 2 children, 2 grandchildren,  Semi retired at [employer].  Allergies: No Known Allergies  Outpatient Meds: Pt. Does not remember From office records of 05/2011: ASA 81 mg daily Lisinopril 40 mg daily Lopressor 25 BID Fish oil Simvastatin 80 mg daily NTG prn Sl Plavix 75 mg daily  Results for orders placed during the hospital encounter of 06/26/12 (from the past 48 hour(s))  CBC     Status: Normal   Collection Time   06/26/12 11:40 PM      Component Value Range Comment   WBC 9.5  4.0 - 10.5 K/uL    RBC 4.72  4.22 - 5.81 MIL/uL    Hemoglobin 15.4  13.0 - 17.0 g/dL    HCT 43.8  39.0 - 52.0 %    MCV 92.8  78.0 - 100.0 fL    MCH 32.6  26.0 - 34.0 pg     MCHC 35.2  30.0 - 36.0 g/dL    RDW 13.8  11.5 - 15.5 %    Platelets 198  150 - 400 K/uL   APTT     Status: Normal   Collection Time   06/26/12 11:40 PM      Component Value Range Comment   aPTT 27  24 - 37 seconds   COMPREHENSIVE METABOLIC PANEL     Status: Abnormal   Collection Time   06/26/12 11:40 PM      Component Value Range Comment   Sodium 138  135 - 145 mEq/L    Potassium 3.9  3.5 - 5.1 mEq/L    Chloride 105  96 - 112 mEq/L    CO2 24  19 - 32 mEq/L    Glucose, Bld 122 (*) 70 - 99 mg/dL    BUN 15  6 - 23 mg/dL    Creatinine, Ser 1.72 (*) 0.50 - 1.35 mg/dL    Calcium 9.8  8.4 - 10.5 mg/dL    Total Protein 7.3  6.0 - 8.3 g/dL    Albumin 3.4 (*) 3.5 - 5.2 g/dL    AST 16  0 - 37 U/L    ALT 9  0 - 53 U/L    Alkaline Phosphatase 67  39 - 117 U/L    Total Bilirubin 0.5  0.3 - 1.2 mg/dL    GFR calc non Af Amer 39 (*) >90 mL/min    GFR calc Af Amer 45 (*) >90 mL/min   PROTIME-INR     Status: Normal   Collection Time   06/26/12 11:40 PM      Component Value Range Comment   Prothrombin Time 12.8  11.6 - 15.2 seconds    INR 0.94  0.00 - 1.49   POCT I-STAT TROPONIN I     Status: Normal   Collection Time   06/26/12 11:55 PM      Component Value Range Comment   Troponin i, poc 0.01  0.00 - 0.08 ng/mL    Comment 3            POCT I-STAT, CHEM 8     Status: Abnormal   Collection Time   06/27/12 12:02 AM      Component Value Range Comment   Sodium 142  135 - 145 mEq/L    Potassium 4.0  3.5 - 5.1 mEq/L    Chloride 108  96 - 112 mEq/L    BUN 15  6 - 23 mg/dL    Creatinine, Ser 1.90 (*) 0.50 - 1.35 mg/dL    Glucose, Bld 116 (*) 70 - 99 mg/dL    Calcium,  Ion 1.21  1.13 - 1.30 mmol/L    TCO2 22  0 - 100 mmol/L    Hemoglobin 15.6  13.0 - 17.0 g/dL    HCT 46.0  39.0 - 52.0 %    Dg Chest Portable 1 View  06/27/2012  *RADIOLOGY REPORT*  Clinical Data: Chest pain.  PORTABLE CHEST - 1 VIEW  Comparison: PA and lateral chest 08/22/2011.  Findings: The patient is status post CABG.  Heart  size is normal. No pneumothorax or pleural fluid.  Lungs are clear.  IMPRESSION: No acute disease.   Original Report Authenticated By: Arvid Right. D'ALESSIO, M.D.     ROS: General:The colds or fevers, cough started Skin:No rashes or ulcers HEENT:[place], wears glasses [place]:[id number] pain as Described PUL:No shortness of breath GI:No diarrhea constipation or melena GU:No hematuria no dysuria MS:No arthritic complaints Neuro:Syncope no lightheadedness no dizziness Endo:Denies diabetes or thyroid disease   Blood pressure 160/80, pulse 84, temperature 98.1 F (36.7 C), temperature source Oral, resp. rate 22, height 5\' 10"  (1.778 m), weight 83.915 kg (185 lb), SpO2 97.00%. PE: General:Alert and oriented x3, Pain currently has increased now 6/10  Skin:Warm and dry brisk capillary refill HEENT:Normocephalic sclera clear Neck:Supple no JVD no carotid bruits Heart:S1-S2 regular rate and rhythm without murmur gallop rub or Click Lungs:Clear with rhonchi in the bases [id number] bowel sounds soft nontender cannot palpate liver spleen or masses Ext:No edema 2+ pedal pulses bilaterally 2+ radial pulses bilaterally Neuro:Alert and oriented x3 follows commands moves all extremities    Assessment/Plan Active Problems:  STEMI (ST elevation myocardial infarction)  CAD (coronary artery disease) of artery bypass graft  CAD (coronary artery disease), with CABG 1989 and re-do Cabg 2002 X 3 vesssels.  progressive disease with Stent to Lt. Main, and 1st diag 2006, stent to VG to LCX 2007, with known occlusion history o   Congenital single kidney  CKD (chronic kidney disease) stage 3, GFR 30-59 ml/min  PLAN:To cath lab emergently for continued pain and return of st elevation in inf. Leads.  Dr. Claiborne Billings here and has examined the patient. INGOLD,LAURA R 06/27/2012, 1:40 AM   Patient seen and examined. Agree with assessment and plan. Pt is a 71 yo WM with initial CABG in 1989, redo CABG in 2002. He is  s/p LM stenting and stent to DX1, s/p stenting to vein to LCX with documented subsequent occlusion of graft, known native occlusion on LCX and RCA with patent vein to RCA and patent LIMA graft to mid LAD on last cath.  ECG's have shown old IMI with significant inferior Q waves. Tonight he developed chest pain and had subtle ST elevation in inferior leads with large Q waves. I had discussed scenario with ER physician and with solitary kidney, old MI and improvemet of cp with NTG and heparin and resolution of minimal ST changes the decision was not to take him emergently to the cath lab.  He subsequently developed slight increase in CP with slight return of J point elevation; therefore, will  take to cath for definitive re-evaluation of coronary anatomy.  Troy Sine, MD, [facility] 06/27/2012 2:37 AM

## 2012-06-27 NOTE — Progress Notes (Signed)
Chaplain Note:  Chaplain responded immediately to Code STEMI page.  Pt was in [facility] being treated by clinical staff.  There were no family members or friends present.  Chaplain accompanied pt to Cath Lab and provided spiritual comfort and support for pt and staff. Staff and pt expressed appreciation for chaplain support.  Chaplain will refer case to day shift chaplain.  06/27/12 0200  Clinical Encounter Type  Visited With Patient;Health care provider  Visit Type Spiritual support  Referral From Other (Comment) (Code STEMI page)  Consult/Referral To Chaplain  Spiritual Encounters  Spiritual Needs Emotional  Stress Factors  Patient Stress Factors Health changes  Family Stress Factors Not reviewed (No family present)   Jearld Lesch, [place] resident [phone]

## 2012-06-27 NOTE — Progress Notes (Signed)
ANTICOAGULATION CONSULT NOTE - Initial Consult  Pharmacy Consult for heparin Indication: chest pain/ACS  No Known Allergies  Patient Measurements: Height: 5\' 10"  (177.8 cm) Weight: 185 lb (83.915 kg) IBW/kg (Calculated) : 73  Heparin Dosing Weight: 84kg  Vital Signs: Temp: 98.1 F (36.7 C) (08/29 2342) Temp src: Oral (08/29 2342) BP: 163/86 mmHg (08/30 0100) Pulse Rate: 79  (08/30 0157)  Labs:  Basename 06/27/12 0002 06/26/12 2340  HGB 15.6 15.4  HCT 46.0 43.8  PLT -- 198  APTT -- 27  LABPROT -- 12.8  INR -- 0.94  HEPARINUNFRC -- --  CREATININE 1.90* 1.72*  CKTOTAL -- --  CKMB -- --  TROPONINI -- --    Estimated Creatinine Clearance: 37.4 ml/min (by C-G formula based on Cr of 1.9).   Medical History: Past Medical History  Diagnosis Date  . Coronary artery disease   . Hypertension   . Myocardial infarction   . STEMI (ST elevation myocardial infarction) 06/27/2012  . CAD (coronary artery disease), with CABG 1989 and re-do Cabg 2002 X 3 vesssels.  progressive disease with Stent to Lt. Main, and 1st diag 2006, stent to VG to LCX 2007, with known occlusion history o  06/27/2012  . 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 CKD (chronic kidney disease) stage 3, GFR 30-59 ml/min 06/27/2012  . Hyperlipidemia LDL goal < 70 06/27/2012  . Tobacco abuse 06/27/2012    Medications:  Prescriptions prior to admission  Medication Sig Dispense Refill  . aspirin 81 MG chewable tablet Chew 81 mg by mouth daily.      . clopidogrel (PLAVIX) 75 MG tablet Take 75 mg by mouth daily.      . nitroGLYCERIN (NITROSTAT) 0.4 MG SL tablet Place 0.4 mg under the tongue every 5 (five) minutes as needed. For chest pain      . PRESCRIPTION MEDICATION Patient is on Both Simvastatin and Metoprolol but doesn't know strength       Scheduled:    . sodium chloride  1,000 mL Intravenous Once  . aspirin  324 mg Oral Once  . aspirin EC  81 mg Oral Daily  . clopidogrel  75 mg Oral Q breakfast  . fentaNYL       . fentaNYL  50 mcg Intravenous Once  . heparin      . heparin  4,000 Units Intravenous STAT  . lidocaine      . metoprolol tartrate  25 mg Oral BID  . midazolam      .  morphine injection  3 mg Intravenous Once  . nitroGLYCERIN      . ondansetron (ZOFRAN) IV  4 mg Intravenous Once  . ranolazine  500 mg Oral BID  . simvastatin  40 mg Oral q1800  . DISCONTD: aspirin  81 mg Oral Daily  . DISCONTD: simvastatin  80 mg Oral q1800    Assessment: 71yo male with significant cardiac hx c/o midsternal CP, found with ST elevation but pain dropped with heparin and NTG with resolution of ST elevation, then had recurrent CP and sent emergently to cath lab, now to begin heparin 4hr after sheath removal.  Goal of Therapy:  Heparin level 0.3-0.7 units/ml Monitor platelets by anticoagulation protocol: Yes   Plan:  Rec'd heparin bolus of 4000 units and gtt at 1000 units/hr in ED prior to plan for cath lab; will resume heparin gtt at 1000 units/hr at 0630 and monitor heparin levels and CBC.  Rogue Bussing PharmD BCPS 06/27/2012,3:16 AM

## 2012-06-27 NOTE — Progress Notes (Signed)
[id number] Assisted pt to chair. Became dizzy upon standing and wanted to sit a while. Educated pt while he sat. Asked pt why he was not taking meds and he said he forgot them for a few days and then he just quit. Discussed smoking cessation and gave pt handouts. Pt not sure if he will be able to quit. He said he has used patches before. Pt's diet includes 2 chicken sandwiches and tea on the way to work and little debbie cakes and potato chips at night. Pt states he is not going to cook. Discussed tuna, microwaveable vegs, peanut butter and whole wheat ,etc.  As better choices. Pt needs reenforcement with diet. Pt gave permission to refer to [facility]. Has attended twice. States will discuss with cardiologist. Left pt in recliner with call bell. Graclyn Lawther DunlapRN

## 2012-06-27 NOTE — ED Notes (Signed)
Pulses are 2+ bilaterally

## 2012-06-28 LAB — BASIC METABOLIC PANEL WITH GFR
BUN: 13 mg/dL (ref 6–23)
CO2: 22 meq/L (ref 19–32)
Calcium: 9.1 mg/dL (ref 8.4–10.5)
Chloride: 105 meq/L (ref 96–112)
Creatinine, Ser: 1.41 mg/dL — ABNORMAL HIGH (ref 0.50–1.35)
GFR calc Af Amer: 57 mL/min — ABNORMAL LOW
GFR calc non Af Amer: 49 mL/min — ABNORMAL LOW
Glucose, Bld: 114 mg/dL — ABNORMAL HIGH (ref 70–99)
Potassium: 3.9 meq/L (ref 3.5–5.1)
Sodium: 137 meq/L (ref 135–145)

## 2012-06-28 LAB — HEPARIN LEVEL (UNFRACTIONATED)
Heparin Unfractionated: 0.26 [IU]/mL — ABNORMAL LOW (ref 0.30–0.70)
Heparin Unfractionated: 0.35 [IU]/mL (ref 0.30–0.70)

## 2012-06-28 LAB — CBC
MCH: 31.5 pg (ref 26.0–34.0)
MCV: 92.5 fL (ref 78.0–100.0)
Platelets: 174 10*3/uL (ref 150–400)
RBC: 4.25 MIL/uL (ref 4.22–5.81)

## 2012-06-28 LAB — HEMOGLOBIN A1C
Hgb A1c MFr Bld: 5.8 % — ABNORMAL HIGH
Mean Plasma Glucose: 120 mg/dL — ABNORMAL HIGH

## 2012-06-28 LAB — TROPONIN I: Troponin I: 16.56 ng/mL (ref <0.30)

## 2012-06-28 MED ORDER — ATORVASTATIN CALCIUM 40 MG PO TABS
40.0000 mg | ORAL_TABLET | Freq: Every day | ORAL | Status: DC
  Filled 2012-06-28: qty 1

## 2012-06-28 MED ORDER — ATORVASTATIN CALCIUM 80 MG PO TABS
80.0000 mg | ORAL_TABLET | Freq: Every day | ORAL | Status: DC
  Administered 2012-06-28: 80 mg via ORAL
  Filled 2012-06-28 (×2): qty 1

## 2012-06-28 NOTE — Cardiovascular Report (Signed)
NAME:  Christopher Wilson, Bo                ACCOUNT NO.:  [id number]  MEDICAL RECORD NO.:  [id number]  LOCATION:  2912                         FACILITY:  [facility]  PHYSICIAN:  Shelva Majestic, M.D.     DATE OF BIRTH:  May 02, 1941  DATE OF PROCEDURE:  06/26/2012 DATE OF DISCHARGE:                           CARDIAC CATHETERIZATION   INDICATIONS:  [name] is a 71 year old gentleman who has longstanding history of coronary artery disease.  He underwent initial CBG revascularization surgery in 1989.  In 1999, he underwent bare metal stenting to his ostial proximal right coronary artery.  In 2002, he suffered an RV infarction and at that time underwent redo CABG surgery x3 by Dr. Prescott Gum.  In 2006, he underwent stenting of his left main and his first diagonal vessel.  In 2007, he underwent stenting of the vein graft to the circumflex vessel.  Subsequent catheterization in 2009 showed total occlusion of his vein graft.  His last catheterization was in 2010, which showed moderate LV dysfunction with an EF of 35-40%.  He had significant native CAD with widely patent left main stent and widely patent diagonal stent.  His LAD was totally occluded beyond the diagonal, but the mid distal LAD was filled via the patent LIMA graft. Occlusion of the proximal circumflex and a vein graft to the circumflex vessel was occluded at its origin prior to the previously placed stents. His native RCA was occluded and he had a patent vein graft supplying the mid PDA and distal right coronary artery.  The patient had been on medical therapy.  Recently, the patient states he has not been taking his medicines routinely.  Over the last several weeks, he has hardly taken any medications.  In addition, he has not been taking his Plavix. He stopped his Ranexa as well as resumed smoking.  He has not been taking his statin.  Last evening, the patient developed some episodes of recurrent chest pain.  He ultimately presented to  the emergency room where his EKG showed his old inferior wall MI.  There was subtle J-point elevation in those inferior leads with his prior infarction.  This J- point elevation was slightly more pronounced from a previous ECG.  At that time, the patient had negative troponin.  Chest pain had started to improve with nitroglycerin and heparin.  Subsequently, he again experienced more chest pain.  He is now taken acutely to the catheterization laboratory for definitive re-evaluation.  PROCEDURE:  Upon arrival to the cath lab, the patient now was significantly improved since he had received morphine in the emergency room.  He already was on IV nitroglycerin drip and had received IV heparin.  Catheterization was done via the right femoral artery and 6- French arterial sheath was inserted.  Diagnostic cardiac catheterization was done utilizing 6-French Judkins 4 left and right coronary catheters. The right catheter was used for selective angiography also into the vein grafts.  A LIMA catheter was also used to attempt selective angiography into the LIMA graft.  The pigtail catheter was then inserted.  Due to the patient's creatinine of 1.9, left ventriculography was not performed but the catheter was passed into the left ventricle  for LVEDP pressure recording.  At this point, the patient was entirely pain free.  His anatomy did not appear to be significantly changed from his last catheterization.  Medical therapy was recommended.  He left the catheterization laboratory in stable condition.  HEMODYNAMIC DATA:  Central aortic pressure 150/80, left ventricular pressure 150/11, post A-wave 25.  ANGIOGRAPHIC DATA:  A stent was placed in the left main.  In one view, there was mild ostial narrowing in the stent of approximately 20%, which was not seen on other views.  The LAD gave rise to proximal diagonal vessel.  The stent in the proximal portion of diagonal vessel was widely patent.  The LAD  gave rise to 2 septal perforating arteries and was totally occluded after the diagonal.  The circumflex vessel was small caliber vessel that was totally occluded in its mid segment after small marginal vessel.  The right coronary artery was totally occluded ostially prior to the previously placed ostial stent.  The vein graft, which had supplied the circumflex vessel and had diffuse stenting was again documented to be totally occluded.  The vein graft supplying the right coronary artery was a large graft. This graft remained widely patent without stenosis.  This anastomosed into the mid PDA.  There was filling back up to proximal to the crux retrograde and the entire posterolateral system filled and was without significant disease.  The LIMA graft was widely patent and anastomosed into the mid LAD.  IMPRESSION: 1. Unstable angina/acute coronary syndrome with transient ST changes     in a patient with prior inferior infarction. 2. Significant native coronary artery disease with patent left main     stent with mild ostial 20% in-stent narrowing; patent stent in the     diagonal vessel of the left anterior descending with previously     documented old occlusion of the left anterior descending after the     diagonal vessel. 3. Occlusion of the native circumflex vessel. 4. Total occlusion of the ostial right coronary artery. 5. Old occlusion of the vein graft which had previously supplied the     circumflex vessel. 6. Widely patent vein graft supplying the right coronary artery. 7. Widely patent LIMA graft supplying the left anterior descending.  RECOMMENDATION:  Continue aggressive medical therapy.  Statin therapy will be resumed.  We will also resume Ranexa, continue beta-blocker.  We will hold his ACE following hydration due to catheterization with his solitary kidney and if renal function stabilizes, it will be resumed. Smoking cessation counsel will be administered.  The plan  is for increased aggressive medical therapy.          ______________________________ Shelva Majestic, M.D.     TK/MEDQ  D:  06/27/2012  T:  06/28/2012  Job:  [id number]

## 2012-06-28 NOTE — Progress Notes (Signed)
ANTICOAGULATION CONSULT NOTE - Follow-up Consult  Pharmacy Consult for heparin Indication: chest pain/ACS  No Known Allergies  Patient Measurements: Height: 5\' 10"  (177.8 cm) Weight: 185 lb 13.6 oz (84.3 kg) IBW/kg (Calculated) : 73  Heparin Dosing Weight: 84kg  Vital Signs: Temp: 97.8 F (36.6 C) (08/31 0700) Temp src: Oral (08/31 0700) BP: 145/79 mmHg (08/31 0800) Pulse Rate: 69  (08/31 0800)  Labs:  [name] 06/28/12 [id number] 06/28/12 0836 06/28/12 0125 06/27/12 1538 06/27/12 1331 06/27/12 0905 06/27/12 0324 06/27/12 0002 06/26/12 2340  HGB -- -- 13.4 -- -- -- 13.9 -- --  HCT -- -- 39.3 -- -- -- 41.3 46.0 --  PLT -- -- 174 -- -- -- 187 -- 198  APTT -- -- -- -- -- -- -- -- 27  LABPROT -- -- -- -- -- -- -- -- 12.8  INR -- -- -- -- -- -- -- -- 0.94  HEPARINUNFRC 0.35 -- 0.26* 0.15* -- -- -- -- --  CREATININE -- -- 1.41* -- -- -- 1.61* 1.90* --  CKTOTAL -- -- -- -- -- -- 126 -- --  CKMB -- -- -- -- -- -- 5.6* -- --  TROPONINI -- 16.56* -- -- 7.02* 4.90* -- -- --    Estimated Creatinine Clearance: 50.3 ml/min (by C-G formula based on Cr of 1.41).   Medical History: Past Medical History  Diagnosis Date  . Coronary artery disease   . Hypertension   . Myocardial infarction   . STEMI (ST elevation myocardial infarction) 06/27/2012  . CAD (coronary artery disease), with CABG 1989 and re-do Cabg 2002 X 3 vesssels.  progressive disease with Stent to Lt. Main, and 1st diag 2006, stent to VG to LCX 2007, with known occlusion history o  06/27/2012  . 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 Hyperlipidemia LDL goal < 70 06/27/2012  . Tobacco abuse 06/27/2012  . CKD (chronic kidney disease) stage 3, GFR 30-59 ml/min 06/27/2012  . ACS (acute coronary syndrome), with ST elevation but stable coronary arteries on cath 06/27/12 06/27/2012    Medications:  Prescriptions prior to admission  Medication Sig Dispense Refill  . aspirin 81 MG chewable tablet Chew 81 mg by mouth daily.      .  clopidogrel (PLAVIX) 75 MG tablet Take 75 mg by mouth daily.      . metoprolol tartrate (LOPRESSOR) 25 MG tablet Take 25 mg by mouth 2 (two) times daily.      . nitroGLYCERIN (NITROSTAT) 0.4 MG SL tablet Place 0.4 mg under the tongue every 5 (five) minutes as needed. For chest pain      . simvastatin (ZOCOR) 80 MG tablet Take 80 mg by mouth at bedtime.       Scheduled:     . aspirin EC  81 mg Oral Daily  . atorvastatin  80 mg Oral q1800  . clopidogrel  75 mg Oral Q breakfast  . dextromethorphan  30 mg Oral BID  . guaiFENesin  1,200 mg Oral BID  . isosorbide mononitrate  60 mg Oral Daily  . metoprolol tartrate  25 mg Oral BID  . ondansetron (ZOFRAN) IV  4 mg Intravenous Once  . DISCONTD: atorvastatin  40 mg Oral q1800  . DISCONTD: ranolazine  500 mg Oral BID    Assessment: 71 yo male continues on medical management for ACS/CAD s/p cath with no significant changes in vessels.  Heparin infusion is therapeutic on 1400 units/hr.  CBC remains stable.  Goal of Therapy:  Heparin level 0.3-0.7 units/ml  Monitor platelets by anticoagulation protocol: Yes   Plan:  Continue heparin at 1400 units/hr. Daily heparin level and CBC.  [employer], Pharm.D., BCPS Clinical Pharmacist Pager [phone] 06/28/2012 11:35 AM

## 2012-06-28 NOTE — Progress Notes (Signed)
1136- Pt declined ambulation at this time, c/o feeling tired, will f/u in PM as able.

## 2012-06-28 NOTE — Progress Notes (Signed)
ANTICOAGULATION CONSULT NOTE - Follow Up Consult  Pharmacy Consult for heparin Indication: chest pain/ACS  Labs:  Basename 06/28/12 0125 06/27/12 1538 06/27/12 1331 06/27/12 0905 06/27/12 0324 06/27/12 0002 06/26/12 2340  HGB 13.4 -- -- -- 13.9 -- --  HCT 39.3 -- -- -- 41.3 46.0 --  PLT 174 -- -- -- 187 -- 198  APTT -- -- -- -- -- -- 27  LABPROT -- -- -- -- -- -- 12.8  INR -- -- -- -- -- -- 0.94  HEPARINUNFRC 0.26* 0.15* -- -- -- -- --  CREATININE -- -- -- -- 1.61* 1.90* 1.72*  CKTOTAL -- -- -- -- 126 -- --  CKMB -- -- -- -- 5.6* -- --  TROPONINI -- -- 7.02* 4.90* <0.30 -- --    Assessment: 71yo male remains subtherapeutic on heparin after rate increase though now closer to goal.  Goal of Therapy:  Heparin level 0.3-0.7 units/ml   Plan:  Will increase heparin gtt by 2 units/kg/hr to 1400 units/hr and check level in 8hr.  Rogue Bussing PharmD BCPS 06/28/2012,2:13 AM

## 2012-06-28 NOTE — Progress Notes (Signed)
[facility]  Subjective: No Complaints.  "At my age, I'd rather keep smoking"   Objective: Vital signs in last 24 hours: Temp:  [97.6 F (36.4 C)-98.2 F (36.8 C)] 97.8 F (36.6 C) (08/31 0700) Pulse Rate:  [50-69] 69  (08/31 0800) Resp:  [12-18] 18  (08/31 0100) BP: (119-153)/(58-82) 145/79 mmHg (08/31 0800) SpO2:  [95 %-99 %] 96 % (08/31 0800) Last BM Date:  (PTA)  Intake/Output from previous day: 08/30 0701 - 08/31 0700 In: 1380 [P.O.:1080; I.V.:300] Out: 2250 [Urine:2250] Intake/Output this shift: Total I/O In: 24 [I.V.:24] Out: -   Medications Current Facility-Administered Medications  Medication Dose Route Frequency Provider Last Rate Last Dose  . 0.9 %  sodium chloride infusion   Intravenous Continuous Babette Relic, MD 75 mL/hr at 06/27/12 0116 75 mL/hr at 06/27/12 0116  . 0.9 %  sodium chloride infusion   Intravenous Continuous Troy Sine, MD 125 mL/hr at 06/27/12 0326    . acetaminophen (TYLENOL) tablet 650 mg  650 mg Oral Q4H PRN Troy Sine, MD      . aspirin EC tablet 81 mg  81 mg Oral Daily Cecilie Kicks, NP      . clopidogrel (PLAVIX) tablet 75 mg  75 mg Oral Q breakfast Troy Sine, MD   75 mg at 06/27/12 [phone]  . dextromethorphan (DELSYM) 30 MG/5ML liquid 30 mg  30 mg Oral BID Cecilie Kicks, NP      . guaiFENesin [facility]) 12 hr tablet 1,200 mg  1,200 mg Oral BID Cecilie Kicks, NP      . heparin ADULT infusion 100 units/mL (25000 units/250 mL)  1,400 Units/hr Intravenous Continuous Rogue Bussing, [place] 14 mL/hr at 06/28/12 [id number] 1,400 Units/hr at 06/28/12 0212  . isosorbide mononitrate (IMDUR) 24 hr tablet 60 mg  60 mg Oral Daily Troy Sine, MD   60 mg at 06/27/12 1043  . metoprolol tartrate (LOPRESSOR) tablet 25 mg  25 mg Oral BID Cecilie Kicks, NP   25 mg at 06/27/12 1042  . morphine 2 MG/ML injection 2 mg  2 mg Intravenous Q2H PRN Troy Sine, MD      . nitroGLYCERIN (NITROSTAT) SL tablet 0.4 mg  0.4 mg  Sublingual Q5 min PRN Babette Relic, MD      . nitroGLYCERIN (NITROSTAT) SL tablet 0.4 mg  0.4 mg Sublingual Q5 Min x 3 PRN Cecilie Kicks, NP      . nitroGLYCERIN 0.2 mg/mL in dextrose 5 % infusion  3-30 mcg/min Intravenous Titrated Cecilie Kicks, NP   10 mcg/min at 06/27/12 1241  . ondansetron (ZOFRAN) injection 4 mg  4 mg Intravenous Once Babette Relic, MD      . ondansetron [facility]) injection 4 mg  4 mg Intravenous Q6H PRN Troy Sine, MD      . ranolazine (RANEXA) 12 hr tablet 500 mg  500 mg Oral BID Troy Sine, MD   500 mg at 06/27/12 1042  . DISCONTD: nitroGLYCERIN 0.2 mg/mL in dextrose 5 % infusion  10-200 mcg/min Intravenous Titrated Babette Relic, MD 9 mL/hr at 06/27/12 0325 30 mcg/min at 06/27/12 0325  . DISCONTD: nitroGLYCERIN 0.2 mg/mL in dextrose 5 % infusion  3-30 mcg/min Intravenous Titrated Cecilie Kicks, NP 9 mL/hr at 06/27/12 0327 30 mcg/min at 06/27/12 0327  . DISCONTD: simvastatin (ZOCOR) tablet 40 mg  40 mg Oral q1800 Troy Sine, MD        PE: General appearance:  alert, cooperative and no distress Lungs: clear to auscultation bilaterally Heart: regular rate and rhythm, S1, S2 normal, no murmur, click, rub or gallop Extremities: No LEE Pulses: 2+ and symmetric Skin: Warm and dry.  No ecchymosis, hematoma or tenderness at cath site. Neurologic: Grossly normal  Lab Results:   Basename 06/28/12 0125 06/27/12 0324 06/27/12 0002 06/26/12 2340  WBC 8.2 7.3 -- 9.5  HGB 13.4 13.9 15.6 --  HCT 39.3 41.3 46.0 --  PLT 174 187 -- 198   BMET  Basename 06/28/12 0125 06/27/12 0324 06/27/12 0002 06/26/12 2340  NA 137 136 142 --  K 3.9 4.5 4.0 --  CL 105 105 108 --  CO2 22 22 -- 24  GLUCOSE 114* 139* 116* --  BUN 13 14 15  --  CREATININE 1.41* 1.61* 1.90* --  CALCIUM 9.1 8.8 -- 9.8   PT/INR  Basename 06/26/12 2340  LABPROT 12.8  INR 0.94   Cholesterol  Basename 06/27/12 0324  CHOL 239*   Lipid Panel     Component Value Date/Time   CHOL 239* 06/27/2012  0324   TRIG 135 06/27/2012 0324   HDL 37* 06/27/2012 0324   CHOLHDL 6.5 06/27/2012 0324   VLDL 27 06/27/2012 0324   LDLCALC 175* 06/27/2012 0324   Echo, Study Conclusions  - Left ventricle: The cavity size was normal. Wall thickness was increased in a pattern of mild LVH. Systolic function was mildly reduced. The estimated ejection fraction was in the range of 45% to 50%. Severe hypokinesis of the basal-midinferolateral, inferior, and inferoseptal myocardium. Features are consistent with a pseudonormal left ventricular filling pattern, with concomitant abnormal relaxation and increased filling pressure (grade 2 diastolic dysfunction). Doppler parameters are consistent with elevated mean left atrial filling pressure. - Mitral valve: Mild regurgitation. - Left atrium: The atrium was moderately dilated.   Assessment/Plan  Principal Problem:  *ACS (acute coronary syndrome), with ST elevation but stable coronary arteries on cath 06/27/12 Active Problems:  CAD (coronary artery disease) of artery bypass graft  CAD (coronary artery disease), with CABG 1989 and re-do Cabg 2002 X 3 vesssels.  progressive disease with Stent to Lt. Main, and 1st diag 2006, stent to VG to LCX 2007, with known occlusion history o   Congenital single kidney  CKD (chronic kidney disease) stage 3, GFR 30-59 ml/min  Hyperlipidemia LDL goal < 70  Tobacco abuse  Plan:  S/P Left heart cath with no significant change since the prior.  He had stopped plavix, statin,ranexa and had resumed smoking.  Trig:HDL ~4, LDL 175!   EF [id number], grade 2 diastolic dys.  Improved from 2010.  Recheck cardiac markers.  Last trop 7.02.  Make sure trending down.  SCr improved.  Inferior/lateral TWI on EKG. No Change.  LIkely DC home today if trop decreased.    LOS: 2 days      Caresse Sedivy 06/28/2012 8:07 AM

## 2012-06-28 NOTE — Progress Notes (Signed)
Pt. Seen and examined. Agree with the NP/PA-C note as written. I suspect he had plaque rupture and autolysis. There is a decent sized MI by troponin elevation which is coming down. I would like to keep him on heparin today and restart medical therapy. He has not been on a statin and has a marked dyslipidemia. He also continues to smoke which I talked to him about stopping extensively. Possible d/c tomorrow. Could transfer to stepdown today.  Pixie Casino, MD, [facility] Attending Cardiologist [facility] [place]

## 2012-06-29 LAB — BASIC METABOLIC PANEL
CO2: 19 mEq/L (ref 19–32)
Chloride: 107 mEq/L (ref 96–112)
Glucose, Bld: 129 mg/dL — ABNORMAL HIGH (ref 70–99)
Potassium: 3.8 mEq/L (ref 3.5–5.1)
Sodium: 137 mEq/L (ref 135–145)

## 2012-06-29 LAB — CBC
Hemoglobin: 13.6 g/dL (ref 13.0–17.0)
RBC: 4.31 MIL/uL (ref 4.22–5.81)
WBC: 5.9 10*3/uL (ref 4.0–10.5)

## 2012-06-29 LAB — TROPONIN I: Troponin I: 5.47 ng/mL (ref <0.30)

## 2012-06-29 LAB — HEPARIN LEVEL (UNFRACTIONATED): Heparin Unfractionated: 0.27 IU/mL — ABNORMAL LOW (ref 0.30–0.70)

## 2012-06-29 MED ORDER — ISOSORBIDE MONONITRATE ER 60 MG PO TB24: 60.0000 mg | ORAL_TABLET | Freq: Every day | ORAL | Status: DC

## 2012-06-29 MED ORDER — ATORVASTATIN CALCIUM 80 MG PO TABS: 80.0000 mg | ORAL_TABLET | Freq: Every day | ORAL | Status: DC

## 2012-06-29 NOTE — Progress Notes (Signed)
ANTICOAGULATION CONSULT NOTE - Follow Up Consult  Pharmacy Consult for heparin Indication: chest pain/ACS  Labs:  Basename 06/29/12 0555 06/28/12 [id number] 06/28/12 0836 06/28/12 0125 06/27/12 1331 06/27/12 0905 06/27/12 0324 06/27/12 0002 06/26/12 2340  HGB 13.6 -- -- 13.4 -- -- -- -- --  HCT 39.9 -- -- 39.3 -- -- 41.3 -- --  PLT 171 -- -- 174 -- -- 187 -- --  APTT -- -- -- -- -- -- -- -- 27  LABPROT -- -- -- -- -- -- -- -- 12.8  INR -- -- -- -- -- -- -- -- 0.94  HEPARINUNFRC 0.27* 0.35 -- 0.26* -- -- -- -- --  CREATININE -- -- -- 1.41* -- -- 1.61* 1.90* --  CKTOTAL -- -- -- -- -- -- 126 -- --  CKMB -- -- -- -- -- -- 5.6* -- --  TROPONINI -- -- 16.56* -- 7.02* 4.90* -- -- --    Assessment: 71yo male now subtherapeutic on heparin after one level at goal.  Goal of Therapy:  Heparin level 0.3-0.7 units/ml   Plan:  Will increase heparin gtt to 1500 units/hr and check level in ~6hr.  Rogue Bussing PharmD BCPS 06/29/2012,7:02 AM

## 2012-06-29 NOTE — Progress Notes (Addendum)
Pt. Seen and examined. Agree with the NP/PA-C note as written.  Chest pain free .Marland Kitchen EKG changes have resolved, however, troponin was still rising as of yesterday.  Will need to re-check cardiac markers today. Cannot discharge until troponin has leveled or is trending down.  Probable ST elevation - ?spasm or autolysis. If troponin is lower, we can stop heparin. Possible discharge later today.   Pixie Casino, MD, [facility] Attending Cardiologist [facility] [place]

## 2012-06-29 NOTE — Progress Notes (Signed)
Pt d/c instructions given. Pt verbalized understanding of all information given using the teachback method. IVs removed. Pt was taken via wheelchair to the pt d/c area by the tech.

## 2012-06-29 NOTE — Progress Notes (Signed)
[facility]  Subjective: No Complaints  Objective: Vital signs in last 24 hours: Temp:  [97.9 F (36.6 C)-98.5 F (36.9 C)] 98 F (36.7 C) (09/01 0718) Resp:  [18-20] 18  (08/31 1615) BP: (102-143)/(57-70) 135/65 mmHg (09/01 0715) SpO2:  [96 %-97 %] 96 % (09/01 0516) Last BM Date:  (PTA)  Intake/Output from previous day: 08/31 0701 - 09/01 0700 In: 813 [P.O.:250; I.V.:563] Out: 950 [Urine:950] Intake/Output this shift: Total I/O In: 25 [I.V.:25] Out: -   Medications Current Facility-Administered Medications  Medication Dose Route Frequency Provider Last Rate Last Dose  . 0.9 %  sodium chloride infusion   Intravenous Continuous Babette Relic, MD 75 mL/hr at 06/27/12 0116 75 mL/hr at 06/27/12 0116  . 0.9 %  sodium chloride infusion   Intravenous Continuous Troy Sine, MD 125 mL/hr at 06/27/12 0326    . acetaminophen (TYLENOL) tablet 650 mg  650 mg Oral Q4H PRN Troy Sine, MD      . aspirin EC tablet 81 mg  81 mg Oral Daily Cecilie Kicks, NP   81 mg at 06/28/12 0942  . atorvastatin (LIPITOR) tablet 80 mg  80 mg Oral q1800 Pixie Casino, MD   80 mg at 06/28/12 1733  . clopidogrel (PLAVIX) tablet 75 mg  75 mg Oral Q breakfast Troy Sine, MD   75 mg at 06/29/12 0749  . dextromethorphan (DELSYM) 30 MG/5ML liquid 30 mg  30 mg Oral BID Cecilie Kicks, NP   30 mg at 06/28/12 2136  . guaiFENesin (MUCINEX) 12 hr tablet 1,200 mg  1,200 mg Oral BID Cecilie Kicks, NP   1,200 mg at 06/28/12 2136  . heparin ADULT infusion 100 units/mL (25000 units/250 mL)  1,500 Units/hr Intravenous Continuous Rogue Bussing, PHARMD 15 mL/hr at 06/29/12 0702 1,500 Units/hr at 06/29/12 [id number]  . isosorbide mononitrate (IMDUR) 24 hr tablet 60 mg  60 mg Oral Daily Troy Sine, MD   60 mg at 06/28/12 0942  . metoprolol tartrate (LOPRESSOR) tablet 25 mg  25 mg Oral BID Cecilie Kicks, NP   25 mg at 06/28/12 2136  . morphine 2 MG/ML injection 2 mg  2 mg Intravenous Q2H  PRN Troy Sine, MD      . nitroGLYCERIN (NITROSTAT) SL tablet 0.4 mg  0.4 mg Sublingual Q5 min PRN Babette Relic, MD      . nitroGLYCERIN (NITROSTAT) SL tablet 0.4 mg  0.4 mg Sublingual Q5 Min x 3 PRN Cecilie Kicks, NP      . nitroGLYCERIN 0.2 mg/mL in dextrose 5 % infusion  3-30 mcg/min Intravenous Titrated Cecilie Kicks, NP   10 mcg/min at 06/27/12 1241  . ondansetron (ZOFRAN) injection 4 mg  4 mg Intravenous Once Babette Relic, MD      . ondansetron [facility]) injection 4 mg  4 mg Intravenous Q6H PRN Troy Sine, MD      . DISCONTD: atorvastatin (LIPITOR) tablet 40 mg  40 mg Oral q1800 Tarri Fuller, PA      . DISCONTD: ranolazine (RANEXA) 12 hr tablet 500 mg  500 mg Oral BID Troy Sine, MD   500 mg at 06/27/12 1042    PE: General appearance: alert, cooperative and no distress Lungs: clear to auscultation bilaterally Heart: regular rate and rhythm, S1, S2 normal, no murmur, click, rub or gallop Extremities: No LEE Pulses: 2+ and symmetric Skin: warm and dry Neurologic: Grossly normal  Lab Results:   [name]  06/29/12 0555 06/28/12 0125 06/27/12 0324  WBC 5.9 8.2 7.3  HGB 13.6 13.4 13.9  HCT 39.9 39.3 41.3  PLT 171 174 187   BMET  Basename 06/29/12 0555 06/28/12 0125 06/27/12 0324  NA 137 137 136  K 3.8 3.9 4.5  CL 107 105 105  CO2 19 22 22   GLUCOSE 129* 114* 139*  BUN 19 13 14   CREATININE 1.62* 1.41* 1.61*  CALCIUM 9.3 9.1 8.8   PT/INR  Basename 06/26/12 2340  LABPROT 12.8  INR 0.94   Cholesterol  Basename 06/27/12 0324  CHOL 239*   Lipid Panel     Component Value Date/Time   CHOL 239* 06/27/2012 0324   TRIG 135 06/27/2012 0324   HDL 37* 06/27/2012 0324   CHOLHDL 6.5 06/27/2012 0324   VLDL 27 06/27/2012 0324   LDLCALC 175* 06/27/2012 0324    Assessment/Plan  Principal Problem:  *ACS (acute coronary syndrome), with ST elevation but stable coronary arteries on cath 06/27/12 Active Problems:  CAD (coronary artery disease) of artery bypass graft  CAD  (coronary artery disease), with CABG 1989 and re-do Cabg 2002 X 3 vesssels.  progressive disease with Stent to Lt. Main, and 1st diag 2006, stent to VG to LCX 2007, with known occlusion history o   Congenital single kidney  CKD (chronic kidney disease) stage 3, GFR 30-59 ml/min  Hyperlipidemia LDL goal < 70  Tobacco abuse  Plan: Doing well.  No CP.  AM troponin on 8/31 was 16.59.  Rechecking to make sure trending down. ASA/lipitor/ plavix/ imdur/ lopressor.  Possible DC today.     LOS: 3 days    Christopher Wilson 06/29/2012 8:26 AM

## 2012-07-13 NOTE — Discharge Summary (Signed)
Physician Discharge Summary  Patient ID: Christopher Wilson MRN: [id number] DOB/AGE: 1941-01-12 71 y.o.  Admit date: 06/26/2012 Discharge date: 07/13/2012  Admission Diagnoses:    Discharge Diagnoses:  Principal Problem:  *ACS (acute coronary syndrome), with ST elevation but stable coronary arteries on cath 06/27/12 Active Problems:  CAD (coronary artery disease) of artery bypass graft  CAD (coronary artery disease), with CABG 1989 and re-do Cabg 2002 X 3 vesssels.  progressive disease with Stent to Lt. Main, and 1st diag 2006, stent to VG to LCX 2007, with known occlusion history o   Congenital single kidney  CKD (chronic kidney disease) stage 3, GFR 30-59 ml/min  Hyperlipidemia LDL goal < 70  Tobacco abuse   Discharged Condition: stable  Hospital Course:   71 year old white, divorced, male presented to the emergency room with sudden onset of chest pain midsternal, there is no radiation, no nausea, vomiting, shortness of breath and no diaphoresis. He took one nitroglycerin at home without relief he came to the emergency room and nitroglycerin sublingual has not helped. Originally he had ST elevation of 1/2-1 mm in leads 2,3 and aVF but with IV heparin and IV nitroglycerin his pain dropped from 4-5 to 3/10. EKG improved with resolution of the ST elevation. Then within the next hour the pain returned it was 6/10 and new EKG revealed return of ST elevation.  Unfortunately [name] has significant history of coronary disease had remote PCI done in the 80s to 90s he had his first bypass grafting in 1989 and then with an MI he had a repeat CABG x3 vessels in 2002 by Dr. Prescott Gum. Due to progressive disease he has been stented in his left main and first diagonal in 2006. Has also had a stent to the vein graft to the circumflex in 2007 no a repeat cardiac cath this has been found to be occluded. His last cardiac cath was 2010 EF at that time was 35-40%. Severe native coronary disease with a widely  patent left main stent and widely patent stent in the diagonal branch of the LAD with total occlusion of the LAD beyond the diagonal takeoff. Also total occlusion of the proximal circumflex total occlusion of the native RCA at the ostium. And patent vein graft to the PDA he has close occlusion of the previously stented vein graft to the circumflex he also had a patent left internal mammary artery to the mid LAD medical therapy was recommended.  With this third EKG and return of ST elevation And increased chest pain despite IV heparin and IV nitroglycerin, patient was taken emergently to the cardiac cath lab.  Please note patient has a congenital solitary kidney creatinine is elevated at 1.9 he is receiving a 500 cc bolus of saline and then the infusion at 75 an hour. Last cardiolite 08/2010 was low risk and no ischemia. He had been on Ranexa at one point but was unable to afford. Other History of LCEA.   Left heart cath revealed no significant change in coronaries from prior cath.  Apparently pt was not consistentluy taking his med an had stopped plavix, statin,ranexa and had resumed smoking. Smoking cessation counsel.  2D echo revealed EF of 45-50% and grade two diastolic dysfunction(see full report below).  Troponin trended back down and he was resumed on ASA/lipitor/ plavix/ imdur/ lopressor.  He was discharged in stable condition after being seen by Dr. Debara Pickett.   Consults: None  Significant Diagnostic Studies:  Study Conclusions  - Left ventricle: The cavity  size was normal. Wall thickness was increased in a pattern of mild LVH. Systolic function was mildly reduced. The estimated ejection fraction was in the range of 45% to 50%. Severe hypokinesis of the basal-midinferolateral, inferior, and inferoseptal myocardium. Features are consistent with a pseudonormal left ventricular filling pattern, with concomitant abnormal relaxation and increased filling pressure (grade 2 diastolic dysfunction).  Doppler parameters are consistent with elevated mean left atrial filling pressure. - Mitral valve: Mild regurgitation. - Left atrium: The atrium was moderately dilated.  Emergency Cardiac catherization;  2:30 am  Full note dictated; see diagram in chart.  Christopher Wilson, 70 y.o., male  DICTATION # [phone], [id number]  No significant change from prior cath.  Rec: Medical therapy.  Apparently pt was not consistentluy taking his med an had stopped p[lavix, statin,ranexa and had resumed smoking. Smoking cessation counsel.  Troy Sine, MD, [facility]  06/27/2012  9:06 AM  CBC    Component Value Date/Time   WBC 5.9 06/29/2012 0555   RBC 4.31 06/29/2012 0555   HGB 13.6 06/29/2012 0555   HCT 39.9 06/29/2012 0555   PLT 171 06/29/2012 0555   MCV 92.6 06/29/2012 0555   MCH 31.6 06/29/2012 0555   MCHC 34.1 06/29/2012 0555   RDW 13.7 06/29/2012 0555   LYMPHSABS 1.4 08/22/2011 1705   MONOABS 0.6 08/22/2011 1705   EOSABS 0.3 08/22/2011 1705   BASOSABS 0.0 08/22/2011 1705   Lipid Panel     Component Value Date/Time   CHOL 239* 06/27/2012 0324   TRIG 135 06/27/2012 0324   HDL 37* 06/27/2012 0324   CHOLHDL 6.5 06/27/2012 0324   VLDL 27 06/27/2012 0324   LDLCALC 175* 06/27/2012 0324   Treatments:  See above  Discharge Exam: Blood pressure 135/65, pulse 69, temperature 98 F (36.7 C), temperature source Oral, resp. rate 18, height 5\' 10"  (1.778 m), weight 84.3 kg (185 lb 13.6 oz), SpO2 96.00%.   Disposition: 01-Home or Self Care  Discharge Orders    Future Orders Please Complete By Expires   Amb Referral to Cardiac Rehabilitation      Diet - low sodium heart healthy      Increase activity slowly      Discharge instructions      Comments:   No Lifting more than a gallon of milk or driving for 2 days       Medication List     As of 07/13/2012  9:45 AM    STOP taking these medications         simvastatin 80 MG tablet   Commonly known as: ZOCOR      TAKE these medications         aspirin 81 MG  chewable tablet   Chew 81 mg by mouth daily.      atorvastatin 80 MG tablet   Commonly known as: LIPITOR   Take 1 tablet (80 mg total) by mouth daily at 6 PM.      clopidogrel 75 MG tablet   Commonly known as: PLAVIX   Take 75 mg by mouth daily.      isosorbide mononitrate 60 MG 24 hr tablet   Commonly known as: IMDUR   Take 1 tablet (60 mg total) by mouth daily.      metoprolol tartrate 25 MG tablet   Commonly known as: LOPRESSOR   Take 25 mg by mouth 2 (two) times daily.      nitroGLYCERIN 0.4 MG SL tablet   Commonly known as: NITROSTAT   Place  0.4 mg under the tongue every 5 (five) minutes as needed. For chest pain         Signed: Haylen Bellotti 07/13/2012, 9:45 AM

## 2013-07-17 ENCOUNTER — Other Ambulatory Visit: Payer: Self-pay | Admitting: Cardiovascular Disease

## 2013-07-17 LAB — CBC WITH DIFFERENTIAL/PLATELET
Basophils Absolute: 0.1 10*3/uL (ref 0.0–0.1)
HCT: 45.7 % (ref 39.0–52.0)
Hemoglobin: 16.1 g/dL (ref 13.0–17.0)
Lymphocytes Relative: 26 % (ref 12–46)
Monocytes Absolute: 0.5 10*3/uL (ref 0.1–1.0)
Monocytes Relative: 8 % (ref 3–12)
Neutro Abs: 3.8 10*3/uL (ref 1.7–7.7)
Neutrophils Relative %: 63 % (ref 43–77)
RDW: 14 % (ref 11.5–15.5)
WBC: 6.1 10*3/uL (ref 4.0–10.5)

## 2013-07-17 LAB — LIPID PANEL
LDL Cholesterol: 80 mg/dL (ref 0–99)
VLDL: 18 mg/dL (ref 0–40)

## 2013-07-17 LAB — COMPREHENSIVE METABOLIC PANEL
ALT: 12 U/L (ref 0–53)
AST: 18 U/L (ref 0–37)
Alkaline Phosphatase: 65 U/L (ref 39–117)
Sodium: 140 mEq/L (ref 135–145)
Total Bilirubin: 0.8 mg/dL (ref 0.3–1.2)
Total Protein: 6.9 g/dL (ref 6.0–8.3)

## 2013-07-18 LAB — PSA: PSA: 1.44 ng/mL (ref <=4.00)

## 2013-07-20 ENCOUNTER — Other Ambulatory Visit (HOSPITAL_COMMUNITY): Payer: Self-pay | Admitting: Cardiovascular Disease

## 2013-07-20 DIAGNOSIS — R0989 Other specified symptoms and signs involving the circulatory and respiratory systems: Secondary | ICD-10-CM

## 2013-07-20 DIAGNOSIS — R011 Cardiac murmur, unspecified: Secondary | ICD-10-CM

## 2013-07-20 DIAGNOSIS — I509 Heart failure, unspecified: Secondary | ICD-10-CM

## 2013-07-22 ENCOUNTER — Telehealth: Payer: Self-pay | Admitting: Cardiovascular Disease

## 2013-07-22 NOTE — Telephone Encounter (Signed)
Message forwarded to J.C. Wildman, LPN.  

## 2013-07-22 NOTE — Telephone Encounter (Signed)
Patient states that someone called and did not leave a message.

## 2013-07-23 NOTE — Telephone Encounter (Signed)
Pt. Called and informed via voice mail of his labwork results

## 2013-07-24 ENCOUNTER — Encounter: Payer: Self-pay | Admitting: Cardiovascular Disease

## 2013-07-30 ENCOUNTER — Ambulatory Visit (HOSPITAL_COMMUNITY)
Admission: RE | Admit: 2013-07-30 | Discharge: 2013-07-30 | Disposition: A | Discharge status: Home / Self Care | Payer: Medicare Other | Source: Ambulatory Visit | Attending: Cardiovascular Disease | Admitting: Cardiovascular Disease

## 2013-07-30 ENCOUNTER — Ambulatory Visit (HOSPITAL_BASED_OUTPATIENT_CLINIC_OR_DEPARTMENT_OTHER)
Admission: RE | Admit: 2013-07-30 | Discharge: 2013-07-30 | Disposition: A | Discharge status: Home / Self Care | Payer: Medicare Other | Source: Ambulatory Visit | Attending: Cardiovascular Disease | Admitting: Cardiovascular Disease

## 2013-07-30 DIAGNOSIS — R0989 Other specified symptoms and signs involving the circulatory and respiratory systems: Secondary | ICD-10-CM

## 2013-07-30 DIAGNOSIS — I509 Heart failure, unspecified: Secondary | ICD-10-CM

## 2013-07-30 DIAGNOSIS — R011 Cardiac murmur, unspecified: Secondary | ICD-10-CM

## 2013-07-30 NOTE — Progress Notes (Signed)
2D Echo Performed 07/30/2013    Marygrace Drought, RCS

## 2013-07-30 NOTE — Progress Notes (Signed)
Carotid Duplex Completed. °Brianna L Mazza,RVT °

## 2013-10-27 ENCOUNTER — Other Ambulatory Visit: Payer: Self-pay | Admitting: *Deleted

## 2013-10-27 MED ORDER — LISINOPRIL 40 MG PO TABS: 40.0000 mg | ORAL_TABLET | Freq: Every day | ORAL | Status: DC

## 2013-10-27 NOTE — Telephone Encounter (Signed)
Rx was sent to pharmacy electronically. 

## 2013-12-09 ENCOUNTER — Telehealth: Payer: Self-pay | Admitting: *Deleted

## 2013-12-09 NOTE — Telephone Encounter (Signed)
Pt stated that [facility] on [address] has sent a Rx refill for Metoprolol 25mg  bid a few times and they have not heard back from [place] and he is completely out of his medication.  TK

## 2013-12-10 MED ORDER — METOPROLOL TARTRATE 25 MG PO TABS: 25.0000 mg | ORAL_TABLET | Freq: Two times a day (BID) | ORAL | Status: DC

## 2013-12-10 NOTE — Telephone Encounter (Signed)
Returned call.  Left message that refill will be sent and to call back before 4pm if questions.

## 2014-01-18 ENCOUNTER — Encounter: Payer: Self-pay | Admitting: Cardiovascular Disease

## 2014-01-18 ENCOUNTER — Ambulatory Visit (INDEPENDENT_AMBULATORY_CARE_PROVIDER_SITE_OTHER): Payer: Commercial Managed Care - HMO | Admitting: Cardiovascular Disease

## 2014-01-18 VITALS — BP 112/80 | HR 57 | Ht 70.0 in | Wt 200.6 lb

## 2014-01-18 DIAGNOSIS — Q600 Renal agenesis, unilateral: Secondary | ICD-10-CM

## 2014-01-18 DIAGNOSIS — N183 Chronic kidney disease, stage 3 unspecified: Secondary | ICD-10-CM

## 2014-01-18 DIAGNOSIS — I2510 Atherosclerotic heart disease of native coronary artery without angina pectoris: Secondary | ICD-10-CM

## 2014-01-18 DIAGNOSIS — Q605 Renal hypoplasia, unspecified: Secondary | ICD-10-CM

## 2014-01-18 DIAGNOSIS — I959 Hypotension, unspecified: Secondary | ICD-10-CM

## 2014-01-18 DIAGNOSIS — E785 Hyperlipidemia, unspecified: Secondary | ICD-10-CM

## 2014-01-18 DIAGNOSIS — Q602 Renal agenesis, unspecified: Secondary | ICD-10-CM

## 2014-01-18 DIAGNOSIS — Z720 Tobacco use: Secondary | ICD-10-CM

## 2014-01-18 DIAGNOSIS — F17200 Nicotine dependence, unspecified, uncomplicated: Secondary | ICD-10-CM

## 2014-01-18 NOTE — Patient Instructions (Signed)
Your physician has recommended you make the following change in your medication: cut the Lisinopril into 1/2. Take 1/2 tablet daily. (20 mg)  Your physician recommends that you return for lab work in: 2 weeks.  Your physician recommends that you schedule a follow-up appointment in: 6  Months.

## 2014-01-24 ENCOUNTER — Encounter: Payer: Self-pay | Admitting: Cardiovascular Disease

## 2014-01-24 DIAGNOSIS — I959 Hypotension, unspecified: Secondary | ICD-10-CM | POA: Insufficient documentation

## 2014-01-24 NOTE — Progress Notes (Signed)
Patient ID: Christopher Wilson, male   DOB: 05/21/1941, 73 y.o.   MRN: [id number]     PATIENT PROFILE: Christopher Wilson 72 your former patient of Dr. Rollene Fare. He presents to the office today to establish cardiology care with me after Dr. Lowella Fairy retirement from his recent solo practice.   HPI: [name] has a history of hypertension, and establish aortic disease. In 1989 he underwent CABG revascularization surgery at [facility]. In 1999 he underwent bare-metal stenting to his popliteal proximal RCA. 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 Nils Pyle In 2006.  He underwent stenting of his LM and diagonal and a 2007 underwent stenting of the vein graft to the circumflex vessel. Subsequent catheterization showed total occlusion of the vein graft. Catheterization in 2010 showed an ejection fraction of 35-40%. His last catheterization was in August 2013 when he presented with unstable angina/acute artery syndrome with transient ST changes. He was found to have significant native CAD with a patent left main is stent with mild ostial 20% in-stent narrowing, a patent stent in the diagonal vessel of the LAD with previously documented old occlusion of the LAD after the diagonal vessel and occlusion of the native circumflex and total occlusion of the ostial RCA. There was old occlusion of the vein graft it previously supplied the circumflex. A widely patent vein graft supplying the RCA and a widely patent LIMA graft supplying the LAD. Aggressive medical therapy was recommended. 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  [name] who unfortunately has continued to smoke. He currently is smoking one pack per day. He last saw Dr. Rollene Fare to go that time he was tolerating his medications well and was not having recurrent typical anginal symptoms or symptoms suggestive of heart failure. He was not you'll using supplemental nitroglycerin. When last seen by Dr. Rollene Fare he had his amlodipine to his  regimen for systemic hypertension. He is status post carotid surgery and did have carotid bruits.  Carotid study was done in October 2014.  This showed patent left carotid endarterectomy. 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   Past Medical History  Diagnosis Date  . Coronary artery disease   . Hypertension   . Myocardial infarction   . STEMI (ST elevation myocardial infarction) 06/27/2012  . CAD (coronary artery disease), with CABG 1989 and re-do Cabg 2002 X 3 vesssels.  progressive disease with Stent to Lt. Main, and 1st diag 2006, stent to VG to LCX 2007, with known occlusion history o  06/27/2012  . 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 Hyperlipidemia LDL goal < 70 06/27/2012  . Tobacco abuse 06/27/2012  . CKD (chronic kidney disease) stage 3, GFR 30-59 ml/min 06/27/2012  . ACS (acute coronary syndrome), with ST elevation but stable coronary arteries on cath 06/27/12 06/27/2012    Past Surgical History  Procedure Laterality Date  . Coronary angioplasty with stent placement    . Triple bipass      Hx of CABG and RE do  . Coratid arterty surgery    . Coronary artery bypass graft    . Vascular surgery    . Lcea      No Known Allergies  Current Outpatient Prescriptions  Medication Sig Dispense Refill  . aspirin 81 MG chewable tablet Chew 81 mg by mouth daily.      Marland Kitchen atorvastatin (LIPITOR) 80 MG tablet Take 1 tablet (80 mg total) by mouth daily at 6 PM.  30 tablet  5  .  clopidogrel (PLAVIX) 75 MG tablet Take 75 mg by mouth daily.      . isosorbide mononitrate (IMDUR) 60 MG 24 hr tablet Take 30 mg by mouth daily.      Marland Kitchen lisinopril (PRINIVIL,ZESTRIL) 40 MG tablet Take 40 mg by mouth. Take 1/2 tablet daily.      . metoprolol tartrate (LOPRESSOR) 25 MG tablet Take 1 tablet (25 mg total) by mouth 2 (two) times daily.  60 tablet  1  . nitroGLYCERIN (NITROSTAT) 0.4 MG SL tablet Place 0.4 mg under the tongue every 5 (five) minutes as needed. For chest pain      . amLODipine (NORVASC)  5 MG tablet Take 1 tablet by mouth daily.       No current facility-administered medications for this visit.    Socially he is divorced for 25 years. He has 2 children. He is fairly sedentary. 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 He completed 12th grade education. He hasn't been smoking in excess of 50 years. He does drink occasional beer.  Family History  Problem Relation Age of Onset  . Stroke Mother   . Heart attack Father     ROS is negative for fever chills or night sweats. He denies change in vision or hearing. There is no rash. He denies lymphadenopathy. He denies presyncope or syncope. He does note shortness of breath with activity. There is no chest pressure or anginal symptoms. He does have GERD but this is stable. He denies nausea vomiting or diarrhea. Is unaware of blood in stool or urine. He denies claudication. There are no tremors. There is no diabetes. He does have hyperlipidemia. There is no cold or heat intolerance. He is unaware of sleep disorder breathing issues. Other comprehensive 14 point system review is negative.  PE BP 112/80  Pulse 57  Ht 5\' 10"  (1.778 m)  Wt 200 lb 9.6 oz (90.992 kg)  BMI 28.78 kg/m2 ED blood pressure by me 98-100/80 General: Alert, oriented, no distress.  Skin: normal turgor, no rashes HEENT: Normocephalic, atraumatic. Pupils round and reactive; sclera anicteric; extraocular muscles intact; Fundi mild arterial narrowing. No hemorrhages or exudates; no xanthelasmas  Nose without nasal septal hypertrophy Mouth/Parynx benign; Mallinpatti scale 3 Neck: No JVD, soft carotid bruits bilaterally. carotid bruits; normal carotid upstroke Lungs: Diffusely decreased breath sounds. No wheezing.; no wheezing or rales Chest wall: without tenderness to palpitation Heart: RRR, s1 s2 normal  2/6 systolic murmur to left sternal border. No diastolic murmur rubs thrills or heaves.  Abdomen:   ventral hernia;soft, nontender; no hepatosplenomehaly, BS+; abdominal aorta nontender and not dilated by palpation. Back: no CVA tenderness Pulses 2+ Musculoskeletal: No joint discomfort. Extremities: no clubbinbg cyanosis or edema, Homan's sign negative  Neurologic: grossly nonfocal; Cranial nerves grossly wnl Psychologic: Normal mood and affect    ECG (independently read by me): Sinus bradycardia 57 beats per minute; inferior Q waves with preserved R waves. Nonspecific ST changes. Early transition.   LABS:  BMET    Component Value Date/Time   NA 140 07/17/2013 1157   K 4.6 07/17/2013 1157   CL 104 07/17/2013 1157   CO2 26 07/17/2013 1157   GLUCOSE 92 07/17/2013 1157   BUN 14 07/17/2013 1157   CREATININE 1.73* 07/17/2013 1157   CREATININE 1.62* 06/29/2012 0555   CALCIUM 9.9 07/17/2013 1157   GFRNONAA 41* 06/29/2012 0555   GFRAA 48* 06/29/2012 0555     Hepatic Function  Panel     Component Value Date/Time   PROT 6.9 07/17/2013 1157   ALBUMIN 3.8 07/17/2013 1157   AST 18 07/17/2013 1157   ALT 12 07/17/2013 1157   ALKPHOS 65 07/17/2013 1157   BILITOT 0.8 07/17/2013 1157     CBC    Component Value Date/Time   WBC 6.1 07/17/2013 1157   RBC 4.94 07/17/2013 1157   HGB 16.1 07/17/2013 1157   HCT 45.7 07/17/2013 1157   PLT 216 07/17/2013 1157   MCV 92.5 07/17/2013 1157   MCH 32.6 07/17/2013 1157   MCHC 35.2 07/17/2013 1157   RDW 14.0 07/17/2013 1157   LYMPHSABS 1.6 07/17/2013 1157   MONOABS 0.5 07/17/2013 1157   EOSABS 0.1 07/17/2013 1157   BASOSABS 0.1 07/17/2013 1157     BNP No results found for this basename: probnp    Lipid Panel     Component Value Date/Time   CHOL 145 07/17/2013 1157   TRIG 90 07/17/2013 1157   HDL 47 07/17/2013 1157   CHOLHDL 3.1 07/17/2013 1157   VLDL 18 07/17/2013 1157   LDLCALC 80 07/17/2013 1157     RADIOLOGY: No results found.   ASSESSMENT AND PLAN:  Mr. Freddi Wilson is a 73 year old male with long-standing cardiac history dating back to 1989 when he  underwent his initial CABG revascularization surgery at [facility]. He required parenteral stenting in 1999. In 2002 he presented with an RV infarction syndrome and required intra-aortic balloon pump counterpulsation support and ultimately underwent CABG revascularization surgery for progressive multivessel CAD. He subsequently has had numerous interventions since that time as outlined above and as documented graft to circumflex with significant native CAD evidence for a patent left main stent, a patent stent in the diagonal vessel, native RCA disease, and a patent vein graft supplying the RCA and LIMA graft to LAD. Unfortunately, he still smokes cigarettes and is smoking one pack per day. Ejection fraction is in the range of 45-50%. On exam today he is hypotensive with a blood pressure of 98-100/80 when taken by me. I am reducing his lisinopril from 40 mg to 20 mg. Repeat laboratory will be obtained. He will continue on dual antiplatelet therapy indefinitely and he seems to be tolerating aspirin Plavix combination. I will contact him regarding laboratory results if changes are necessary to his medical regimen. I will see him in 6 months for cardiology evaluation.    Time spent: 45 minutes    Troy Sine, MD, [facility] 01/24/2014 11:15 AM

## 2014-02-03 LAB — CBC
HEMATOCRIT: 43.1 % (ref 39.0–52.0)
Hemoglobin: 15 g/dL (ref 13.0–17.0)
MCH: 30.9 pg (ref 26.0–34.0)
MCHC: 34.8 g/dL (ref 30.0–36.0)
MCV: 88.9 fL (ref 78.0–100.0)
Platelets: 225 10*3/uL (ref 150–400)
RBC: 4.85 MIL/uL (ref 4.22–5.81)
RDW: 14.3 % (ref 11.5–15.5)
WBC: 6.5 10*3/uL (ref 4.0–10.5)

## 2014-02-03 LAB — COMPREHENSIVE METABOLIC PANEL
ALBUMIN: 4 g/dL (ref 3.5–5.2)
ALT: 11 U/L (ref 0–53)
AST: 17 U/L (ref 0–37)
Alkaline Phosphatase: 71 U/L (ref 39–117)
BUN: 17 mg/dL (ref 6–23)
CO2: 23 mEq/L (ref 19–32)
Calcium: 9.9 mg/dL (ref 8.4–10.5)
Chloride: 107 mEq/L (ref 96–112)
Creat: 1.66 mg/dL — ABNORMAL HIGH (ref 0.50–1.35)
Glucose, Bld: 92 mg/dL (ref 70–99)
POTASSIUM: 4.2 meq/L (ref 3.5–5.3)
SODIUM: 138 meq/L (ref 135–145)
Total Bilirubin: 0.6 mg/dL (ref 0.2–1.2)
Total Protein: 6.8 g/dL (ref 6.0–8.3)

## 2014-02-08 ENCOUNTER — Telehealth: Payer: Self-pay | Admitting: *Deleted

## 2014-02-08 MED ORDER — ISOSORBIDE MONONITRATE ER 60 MG PO TB24: 30.0000 mg | ORAL_TABLET | Freq: Every day | ORAL | Status: DC

## 2014-02-08 MED ORDER — CLOPIDOGREL BISULFATE 75 MG PO TABS: 75.0000 mg | ORAL_TABLET | Freq: Every day | ORAL | Status: DC

## 2014-02-08 NOTE — Telephone Encounter (Signed)
Refill(s) sent to pharmacy w/ message: "Dr. Rollene Fare retired.  Please send all requests for his prescriptions via hard copy fax.  We cannot receive them electronically.  Thank you."

## 2014-02-08 NOTE — Telephone Encounter (Signed)
[name] was calling in regards to getting a refill on Christopher Wilson Plavix 75 mg qd and Imdur 60 mg 24hr tablets.  RAW-TK

## 2014-02-17 ENCOUNTER — Encounter: Payer: Self-pay | Admitting: *Deleted

## 2014-03-08 ENCOUNTER — Other Ambulatory Visit: Payer: Self-pay | Admitting: Cardiovascular Disease

## 2014-03-08 NOTE — Telephone Encounter (Signed)
Rx was sent to pharmacy electronically. 

## 2014-07-13 ENCOUNTER — Other Ambulatory Visit: Payer: Self-pay

## 2014-07-13 MED ORDER — AMLODIPINE BESYLATE 5 MG PO TABS: 5.0000 mg | ORAL_TABLET | Freq: Every day | ORAL | Status: DC

## 2014-07-13 NOTE — Telephone Encounter (Signed)
Rx sent to pharmacy   

## 2014-07-28 ENCOUNTER — Encounter: Payer: Self-pay | Admitting: Cardiovascular Disease

## 2014-07-28 ENCOUNTER — Ambulatory Visit (INDEPENDENT_AMBULATORY_CARE_PROVIDER_SITE_OTHER): Payer: Medicare HMO | Admitting: Cardiovascular Disease

## 2014-07-28 VITALS — BP 106/62 | HR 52 | Ht 70.0 in | Wt 191.1 lb

## 2014-07-28 DIAGNOSIS — I10 Essential (primary) hypertension: Secondary | ICD-10-CM

## 2014-07-28 DIAGNOSIS — I2510 Atherosclerotic heart disease of native coronary artery without angina pectoris: Secondary | ICD-10-CM

## 2014-07-28 DIAGNOSIS — Z720 Tobacco use: Secondary | ICD-10-CM

## 2014-07-28 DIAGNOSIS — Q602 Renal agenesis, unspecified: Secondary | ICD-10-CM

## 2014-07-28 DIAGNOSIS — N183 Chronic kidney disease, stage 3 unspecified: Secondary | ICD-10-CM

## 2014-07-28 DIAGNOSIS — R5381 Other malaise: Secondary | ICD-10-CM

## 2014-07-28 DIAGNOSIS — E785 Hyperlipidemia, unspecified: Secondary | ICD-10-CM

## 2014-07-28 DIAGNOSIS — Q600 Renal agenesis, unilateral: Secondary | ICD-10-CM

## 2014-07-28 DIAGNOSIS — Q605 Renal hypoplasia, unspecified: Secondary | ICD-10-CM

## 2014-07-28 DIAGNOSIS — T50904A Poisoning by unspecified drugs, medicaments and biological substances, undetermined, initial encounter: Secondary | ICD-10-CM

## 2014-07-28 DIAGNOSIS — R5383 Other fatigue: Secondary | ICD-10-CM

## 2014-07-28 DIAGNOSIS — I952 Hypotension due to drugs: Secondary | ICD-10-CM

## 2014-07-28 DIAGNOSIS — E782 Mixed hyperlipidemia: Secondary | ICD-10-CM

## 2014-07-28 DIAGNOSIS — F17200 Nicotine dependence, unspecified, uncomplicated: Secondary | ICD-10-CM

## 2014-07-28 DIAGNOSIS — I9589 Other hypotension: Secondary | ICD-10-CM

## 2014-07-28 MED ORDER — ATORVASTATIN CALCIUM 80 MG PO TABS: 80.0000 mg | ORAL_TABLET | Freq: Every day | ORAL | Status: DC

## 2014-07-28 MED ORDER — LISINOPRIL 10 MG PO TABS: 10.0000 mg | ORAL_TABLET | Freq: Every day | ORAL | Status: DC

## 2014-07-28 NOTE — Progress Notes (Signed)
Patient ID: Christopher Wilson, male   DOB: Sep 19, 1941, 73 y.o.   MRN: [id number]     HPI: Christopher Wilson 72 your former patient of Dr. Rollene Fare. He established cardiology care with me in March 2015 .  Presents now for six-month followup evaluation.  Christopher Wilson has a history of hypertension, and establish aortic disease. In 1989 he underwent CABG revascularization surgery at [facility]. In 1999 he underwent bare-metal stenting to his proximal RCA.  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 Nils Pyle.   In 2006 he underwent stenting of his LM and diagonal and a 2007 stenting of the vein graft to the circumflex vessel. Subsequent catheterization showed total occlusion of the vein graft. Catheterization in 2010 showed an ejection fraction of 35-40%. His last catheterization was in August 2013 when he presented with unstable angina/ACS with transient ST changes. He was found to have significant native CAD with a patent left main is stent with mild ostial 20% in-stent narrowing, a patent stent in the diagonal vessel of the LAD with previously documented old occlusion of the LAD after the diagonal vessel and occlusion of the native circumflex and total occlusion of the ostial RCA. There was old occlusion of the vein graft it previously supplied the circumflex. A widely patent vein graft supplying the RCA and a widely patent LIMA graft supplying the LAD. Aggressive medical therapy was recommended. 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  Christopher Wilson who unfortunately has continued to smoke. He currently is smoking one pack per day.  When last seen by Dr. Rollene Fare he had his amlodipine to his regimen for systemic hypertension. He is status post carotid surgery and a carotid study done in October 2014 showed patent left carotid endarterectomy. 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   As I last saw him, he continues to smoke cigarettes.  He has noted some mild episodes of lightheadedness.  He denies  any episodes of chest pressure.  He is unaware of palpitations.  He presents for evaluation.  Past Medical History  Diagnosis Date  . Coronary artery disease   . Hypertension   . Myocardial infarction   . STEMI (ST elevation myocardial infarction) 06/27/2012  . CAD (coronary artery disease), with CABG 1989 and re-do Cabg 2002 X 3 vesssels.  progressive disease with Stent to Lt. Main, and 1st diag 2006, stent to VG to LCX 2007, with known occlusion history o  06/27/2012  . 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 Hyperlipidemia LDL goal < 70 06/27/2012  . Tobacco abuse 06/27/2012  . CKD (chronic kidney disease) stage 3, GFR 30-59 ml/min 06/27/2012  . ACS (acute coronary syndrome), with ST elevation but stable coronary arteries on cath 06/27/12 06/27/2012    Past Surgical History  Procedure Laterality Date  . Coronary angioplasty with stent placement    . Triple bipass      Hx of CABG and RE do  . Coratid arterty surgery    . Coronary artery bypass graft    . Vascular surgery    . Lcea      No Known Allergies  Current Outpatient Prescriptions  Medication Sig Dispense Refill  . amLODipine (NORVASC) 5 MG tablet Take 1 tablet (5 mg total) by mouth daily.  30 tablet  5  . aspirin 81 MG chewable tablet Chew 81 mg by mouth daily.      Marland Kitchen atorvastatin (LIPITOR) 80 MG tablet Take 1 tablet (80 mg total) by mouth  daily at 6 PM.  30 tablet  5  . clopidogrel (PLAVIX) 75 MG tablet Take 1 tablet (75 mg total) by mouth daily.  30 tablet  4  . isosorbide mononitrate (IMDUR) 60 MG 24 hr tablet Take 0.5 tablets (30 mg total) by mouth daily.  30 tablet  4  . lisinopril (PRINIVIL,ZESTRIL) 40 MG tablet Take 40 mg by mouth. Take 1/2 tablet daily.      . metoprolol tartrate (LOPRESSOR) 25 MG tablet TAKE ONE TABLET BY MOUTH TWICE DAILY  60 tablet  10  . nitroGLYCERIN (NITROSTAT) 0.4 MG SL tablet Place 0.4 mg under the tongue every 5 (five) minutes as needed. For chest pain       No current facility-administered  medications for this visit.    Socially he is divorced for 25 years. He has 2 children. He is fairly sedentary. 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 He completed 12th grade education. He hasn't been smoking in excess of 50 years. He does drink occasional beer.  Family History  Problem Relation Age of Onset  . Stroke Mother   . Heart attack Father     ROS General: Negative; No fevers, chills, or night sweats;  HEENT: Negative; No changes in vision or hearing, sinus congestion, difficulty swallowing Pulmonary: Negative; No cough, wheezing, shortness of breath, hemoptysis Cardiovascular: Negative; No chest pain, presyncope, syncope, palpitations GI: Positive for GERD; No nausea, vomiting, diarrhea, or abdominal pain GU: Negative; No dysuria, hematuria, or difficulty voiding Musculoskeletal: Negative; no myalgias, joint pain, or weakness Hematologic/Oncology: Negative; no easy bruising, bleeding Endocrine: Negative; no heat/cold intolerance; no diabetes Neuro: Negative; no changes in balance, headaches Skin: Negative; No rashes or skin lesions Psychiatric: Negative; No behavioral problems, depression Sleep: Negative; No snoring, daytime sleepiness, hypersomnolence, bruxism, restless legs, hypnogognic hallucinations, no cataplexy Other comprehensive 14 point system review is negative.  PE BP 106/62  Pulse 52  Ht 5\' 10"  (1.778 m)  Wt 191 lb 1.6 oz (86.682 kg)  BMI 27.42 kg/m2 Repeat blood pressure by me 105/60. General: Alert, oriented, no distress.  Skin: normal turgor, no rashes HEENT: Normocephalic, atraumatic. Pupils round and reactive; sclera anicteric; extraocular muscles intact; Fundi mild arterial narrowing. No hemorrhages or exudates; no xanthelasmas  Nose without nasal septal hypertrophy Mouth/Parynx benign; Mallinpatti scale 3 Neck: No JVD, soft carotid bruits bilaterally; normal carotid  upstroke Lungs: Diffusely decreased breath sounds. No wheezing.; no wheezing or rales Chest wall: without tenderness to palpitation Heart: RRR, s1 s2 normal  2/6 systolic murmur to left sternal border. No diastolic murmur rubs thrills or heaves.  Abdomen:  ventral hernia;soft, nontender; no hepatosplenomehaly, BS+; abdominal aorta nontender and not dilated by palpation. Back: no CVA tenderness Pulses 2+ Musculoskeletal: No joint discomfort. Extremities: no clubbinbg cyanosis or edema, Homan's sign negative  Neurologic: grossly nonfocal; Cranial nerves grossly wnl Psychologic: Normal mood and affect  ECG (independently read by me): Sinus bradycardia 52 beats per minute.  Inferior infarction with Q waves.  QTC interval 399 ms.  Nonspecific ST changes  March 2015 ECG (independently read by me): Sinus bradycardia 57 beats per minute; inferior Q waves with preserved R waves. Nonspecific ST changes. Early transition.   LABS:  BMET    Component Value Date/Time   NA 138 02/03/2014 1212   K 4.2 02/03/2014 1212   CL 107 02/03/2014 1212   CO2 23 02/03/2014 1212   GLUCOSE 92 02/03/2014 1212   BUN  17 02/03/2014 1212   CREATININE 1.66* 02/03/2014 1212   CREATININE 1.62* 06/29/2012 0555   CALCIUM 9.9 02/03/2014 1212   GFRNONAA 41* 06/29/2012 0555   GFRAA 48* 06/29/2012 0555     Hepatic Function Panel     Component Value Date/Time   PROT 6.8 02/03/2014 1212   ALBUMIN 4.0 02/03/2014 1212   AST 17 02/03/2014 1212   ALT 11 02/03/2014 1212   ALKPHOS 71 02/03/2014 1212   BILITOT 0.6 02/03/2014 1212     CBC    Component Value Date/Time   WBC 6.5 02/03/2014 1212   RBC 4.85 02/03/2014 1212   HGB 15.0 02/03/2014 1212   HCT 43.1 02/03/2014 1212   PLT 225 02/03/2014 1212   MCV 88.9 02/03/2014 1212   MCH 30.9 02/03/2014 1212   MCHC 34.8 02/03/2014 1212   RDW 14.3 02/03/2014 1212   LYMPHSABS 1.6 07/17/2013 1157   MONOABS 0.5 07/17/2013 1157   EOSABS 0.1 07/17/2013 1157   BASOSABS 0.1 07/17/2013 1157     BNP No results found for this  basename: probnp    Lipid Panel     Component Value Date/Time   CHOL 145 07/17/2013 1157   TRIG 90 07/17/2013 1157   HDL 47 07/17/2013 1157   CHOLHDL 3.1 07/17/2013 1157   VLDL 18 07/17/2013 1157   LDLCALC 80 07/17/2013 1157     RADIOLOGY: No results found.   ASSESSMENT AND PLAN:  Christopher Wilson is a 73 year old male with long-standing cardiac history dating back to 1989 when he underwent his initial CABG revascularization surgery at [facility]. He underwent RCA stenting in 1999. In 2002 he presented with an RV infarction syndrome and required intra-aortic balloon pump counterpulsation support and ultimately underwent CABG revascularization surgery for progressive multivessel CAD. He subsequently has had numerous interventions since that time as outlined above and as documented graft to circumflex with significant native CAD evidence for a patent left main stent, a patent stent in the diagonal vessel, native RCA disease, and a patent vein graft supplying the RCA and LIMA graft to LAD. Unfortunately, he still smokes cigarettes and is smoking one pack per day. Ejection fraction is in the range of 45-50%.  When I last saw him, his blood pressure was low, and I reduced his lisinopril from 40 mg to 20 mg.  His blood pressure remains low today as well.  I will further reduce his lisinopril to 10 mg.  I again had a long discussion with him concerning the adverse effects of continued cigarette smoking.  I am recommending that followup laboratory be obtained in several weeks.  He is on atorvastatin 80 mg for hyperlipidemia.  His blood pressure is also being treated with amlodipine 5 mg, metoprolol 25 mg twice a day.  In addition to his lisinopril.  He is not having any angina and continues to take isosorbide 30 mg daily.  I will see him in 6 months for cardiology followup or sooner if problems arise.  Time spent: 25 minutes   Troy Sine, MD, [facility] 07/28/2014 12:41 PM

## 2014-07-28 NOTE — Patient Instructions (Addendum)
Your physician wants you to follow-up in: 6 months or sooner if needed. You will receive a reminder letter in the mail two months in advance. If you don't receive a letter, please call our office to schedule the follow-up appointment.  Your physician recommends that you return for lab work fasting 3 weeks.  Your physician has recommended you make the following change in your medication: decrease the lisinopril to 10 mg daily. A new prescription has been sent to the pharmacy.

## 2014-07-29 ENCOUNTER — Encounter: Payer: Self-pay | Admitting: Cardiovascular Disease

## 2014-08-02 ENCOUNTER — Other Ambulatory Visit (HOSPITAL_COMMUNITY): Payer: Self-pay | Admitting: Cardiovascular Disease

## 2014-08-02 ENCOUNTER — Telehealth (HOSPITAL_COMMUNITY): Payer: Self-pay | Admitting: *Deleted

## 2014-08-02 ENCOUNTER — Encounter (HOSPITAL_COMMUNITY): Payer: Self-pay | Admitting: *Deleted

## 2014-08-02 DIAGNOSIS — R0989 Other specified symptoms and signs involving the circulatory and respiratory systems: Secondary | ICD-10-CM

## 2014-09-06 ENCOUNTER — Ambulatory Visit (HOSPITAL_COMMUNITY)
Admission: RE | Admit: 2014-09-06 | Discharge: 2014-09-06 | Disposition: A | Discharge status: Home / Self Care | Payer: Medicare HMO | Source: Ambulatory Visit | Attending: Cardiology | Admitting: Cardiology

## 2014-09-06 DIAGNOSIS — R0989 Other specified symptoms and signs involving the circulatory and respiratory systems: Secondary | ICD-10-CM

## 2014-09-06 DIAGNOSIS — I672 Cerebral atherosclerosis: Secondary | ICD-10-CM | POA: Diagnosis not present

## 2014-09-06 NOTE — Progress Notes (Signed)
Carotid Duplex Completed. Zacari Stiff, BS, RDMS, RVT  

## 2014-09-07 LAB — COMPREHENSIVE METABOLIC PANEL
ALT: 12 U/L (ref 0–53)
AST: 20 U/L (ref 0–37)
Albumin: 4 g/dL (ref 3.5–5.2)
Alkaline Phosphatase: 79 U/L (ref 39–117)
BILIRUBIN TOTAL: 0.6 mg/dL (ref 0.2–1.2)
BUN: 18 mg/dL (ref 6–23)
CALCIUM: 9.9 mg/dL (ref 8.4–10.5)
CHLORIDE: 105 meq/L (ref 96–112)
CO2: 24 meq/L (ref 19–32)
CREATININE: 1.8 mg/dL — AB (ref 0.50–1.35)
Glucose, Bld: 97 mg/dL (ref 70–99)
Potassium: 4.4 mEq/L (ref 3.5–5.3)
Sodium: 139 mEq/L (ref 135–145)
Total Protein: 6.7 g/dL (ref 6.0–8.3)

## 2014-09-07 LAB — LIPID PANEL
CHOL/HDL RATIO: 3.6 ratio
CHOLESTEROL: 158 mg/dL (ref 0–200)
HDL: 44 mg/dL (ref >39)
LDL Cholesterol: 95 mg/dL (ref 0–99)
TRIGLYCERIDES: 95 mg/dL (ref <150)
VLDL: 19 mg/dL (ref 0–40)

## 2014-09-07 LAB — CBC
HEMATOCRIT: 44.5 % (ref 39.0–52.0)
Hemoglobin: 15.2 g/dL (ref 13.0–17.0)
MCH: 31.5 pg (ref 26.0–34.0)
MCHC: 34.2 g/dL (ref 30.0–36.0)
MCV: 92.1 fL (ref 78.0–100.0)
Platelets: 234 10*3/uL (ref 150–400)
RBC: 4.83 MIL/uL (ref 4.22–5.81)
RDW: 13.5 % (ref 11.5–15.5)
WBC: 7.6 10*3/uL (ref 4.0–10.5)

## 2014-09-07 LAB — TSH: TSH: 1.439 u[IU]/mL (ref 0.350–4.500)

## 2014-09-10 ENCOUNTER — Telehealth (HOSPITAL_COMMUNITY): Payer: Self-pay | Admitting: *Deleted

## 2014-09-13 ENCOUNTER — Telehealth: Payer: Self-pay | Admitting: *Deleted

## 2014-09-13 DIAGNOSIS — Z79899 Other long term (current) drug therapy: Secondary | ICD-10-CM

## 2014-09-14 NOTE — Telephone Encounter (Signed)
Pt is returning Wanda's call in regards to some test results. Please call  Thanks

## 2014-09-14 NOTE — Telephone Encounter (Signed)
Returning your call. °

## 2014-09-15 ENCOUNTER — Telehealth: Payer: Self-pay | Admitting: Cardiovascular Disease

## 2014-09-15 MED ORDER — CLOPIDOGREL BISULFATE 75 MG PO TABS: 75.0000 mg | ORAL_TABLET | Freq: Every day | ORAL | Status: DC

## 2014-09-15 NOTE — Telephone Encounter (Signed)
Pt still have not received his Clopidogrel. Would you please call this in today to [phone]

## 2014-09-15 NOTE — Telephone Encounter (Signed)
Rx refill sent to patient pharmacy   

## 2014-09-15 NOTE — Telephone Encounter (Signed)
Pt still waiting to hear from you.

## 2014-09-16 NOTE — Telephone Encounter (Signed)
Pt says he is responding from your call on Monday.

## 2014-09-21 ENCOUNTER — Other Ambulatory Visit: Payer: Self-pay | Admitting: *Deleted

## 2014-09-21 DIAGNOSIS — I6523 Occlusion and stenosis of bilateral carotid arteries: Secondary | ICD-10-CM

## 2014-09-21 NOTE — Telephone Encounter (Signed)
Patient notified of lab results - instructed on med changes per Dr. Claiborne Billings - decrease lisinopril by half (down to 5mg  QD) and have BMET in 2 weeks. Patient voiced understanding.

## 2014-10-07 ENCOUNTER — Encounter (HOSPITAL_COMMUNITY): Payer: Self-pay | Admitting: Cardiovascular Disease

## 2014-12-06 ENCOUNTER — Other Ambulatory Visit: Payer: Self-pay

## 2014-12-06 ENCOUNTER — Other Ambulatory Visit: Payer: Self-pay | Admitting: Cardiovascular Disease

## 2014-12-06 MED ORDER — AMLODIPINE BESYLATE 5 MG PO TABS: 5.0000 mg | ORAL_TABLET | Freq: Every day | ORAL | Status: DC

## 2014-12-06 NOTE — Telephone Encounter (Signed)
Rx(s) sent to pharmacy electronically.  

## 2014-12-06 NOTE — Telephone Encounter (Signed)
Pt sill need his Amlodipine #90 and refills. Please call to [phone]. Please call this in today if possible.

## 2015-01-06 ENCOUNTER — Ambulatory Visit (INDEPENDENT_AMBULATORY_CARE_PROVIDER_SITE_OTHER): Payer: Medicare HMO

## 2015-01-06 ENCOUNTER — Ambulatory Visit (INDEPENDENT_AMBULATORY_CARE_PROVIDER_SITE_OTHER): Payer: Medicare HMO | Admitting: Family Medicine

## 2015-01-06 VITALS — BP 134/82 | HR 104 | Temp 99.2°F | Resp 18 | Ht 69.0 in | Wt 183.0 lb

## 2015-01-06 DIAGNOSIS — M25511 Pain in right shoulder: Secondary | ICD-10-CM | POA: Diagnosis not present

## 2015-01-06 DIAGNOSIS — R6889 Other general symptoms and signs: Secondary | ICD-10-CM

## 2015-01-06 DIAGNOSIS — J101 Influenza due to other identified influenza virus with other respiratory manifestations: Secondary | ICD-10-CM | POA: Diagnosis not present

## 2015-01-06 LAB — POCT CBC
GRANULOCYTE PERCENT: 83.6 % — AB (ref 37–80)
HCT, POC: 47.6 % (ref 43.5–53.7)
Hemoglobin: 15.5 g/dL (ref 14.1–18.1)
LYMPH, POC: 0.8 (ref 0.6–3.4)
MCH, POC: 29.7 pg (ref 27–31.2)
MCHC: 32.5 g/dL (ref 31.8–35.4)
MCV: 91.3 fL (ref 80–97)
MID (cbc): 0.4 (ref 0–0.9)
MPV: 7 fL (ref 0–99.8)
POC GRANULOCYTE: 6.1 (ref 2–6.9)
POC LYMPH %: 10.8 % (ref 10–50)
POC MID %: 5.6 %M (ref 0–12)
Platelet Count, POC: 197 10*3/uL (ref 142–424)
RBC: 5.22 M/uL (ref 4.69–6.13)
RDW, POC: 14.2 %
WBC: 7.3 10*3/uL (ref 4.6–10.2)

## 2015-01-06 LAB — POCT INFLUENZA A/B
Influenza A, POC: POSITIVE
Influenza B, POC: NEGATIVE

## 2015-01-06 MED ORDER — OSELTAMIVIR PHOSPHATE 75 MG PO CAPS
75.0000 mg | ORAL_CAPSULE | Freq: Two times a day (BID) | ORAL | Status: DC
Start: 2015-01-06 — End: 2015-07-07

## 2015-01-06 NOTE — Patient Instructions (Signed)

## 2015-01-06 NOTE — Progress Notes (Signed)
Subjective:    Patient ID: Christopher Wilson, male    DOB: [birth year], 74 y.o.   MRN: [id number]  01/06/2015  Fever; Cough; Fatigue; and Shoulder Injury   HPI This 74 y.o. male presents for evaluation of flu like symptoms.   Onset one day ago.   +fever; +sweats.  No medications for fever.  No headache.  +achy.  No sore throat or ear pain.  +rhinorrhea; +nasal congestion.  Mild cough.  No SOB.  +vomiting two days ago; vomited x 3.  No abdominal pain.  No diarrhea.  No rash. No vomiting today.  No flu vaccine. Denies dysuria, urgency, frequency, hematuria, nocturia worsening.  No back pain.   Minimal po and fluid intake.  No flu vaccine.  +smoker.  R shoulder pain: fell on R shoulder two months ago on ice.  Continues to have R shoulder pain with elevation of R shoulder.  No radiation into arm.  No n/t.  +weakness.    PCP:  [facility]/Cardiology; does not have PCP. Only followed by cardiology.     Review of Systems  Constitutional: Positive for fever, chills, diaphoresis and fatigue.  HENT: Positive for congestion and rhinorrhea. Negative for ear pain and sore throat.   Respiratory: Positive for cough. Negative for shortness of breath and wheezing.   Gastrointestinal: Positive for vomiting. Negative for nausea, abdominal pain and diarrhea.  Genitourinary: Negative for dysuria, urgency, frequency, hematuria, flank pain and genital sores.  Musculoskeletal: Positive for arthralgias. Negative for back pain and joint swelling.  Skin: Negative for rash.  Neurological: Negative for dizziness and headaches.    Past Medical History  Diagnosis Date  . Coronary artery disease   . Hypertension   . Myocardial infarction   . STEMI (ST elevation myocardial infarction) 06/27/2012  . CAD (coronary artery disease), with CABG 1989 and re-do Cabg 2002 X 3 vesssels.  progressive disease with Stent to Lt. Main, and 1st diag 2006, stent to VG to LCX 2007, with known occlusion history o  06/27/2012  . Congenital  single kidney 06/27/2012  . Hyperlipidemia LDL goal < 70 06/27/2012  . Tobacco abuse 06/27/2012  . CKD (chronic kidney disease) stage 3, GFR 30-59 ml/min 06/27/2012  . ACS (acute coronary syndrome), with ST elevation but stable coronary arteries on cath 06/27/12 06/27/2012   Past Surgical History  Procedure Laterality Date  . Coronary angioplasty with stent placement    . Triple bipass      Hx of CABG and RE do  . Coratid arterty surgery    . Coronary artery bypass graft    . Vascular surgery    . Lcea    . Left heart cath N/A 06/27/2012    Procedure: LEFT HEART CATH;  Surgeon: Troy Sine, MD;  Location: [facility] CATH LAB;  Service: Cardiovascular;  Laterality: N/A;   No Known Allergies Current Outpatient Prescriptions  Medication Sig Dispense Refill  . amLODipine (NORVASC) 5 MG tablet Take 1 tablet (5 mg total) by mouth daily. 90 tablet 2  . aspirin 81 MG chewable tablet Chew 81 mg by mouth daily.    Marland Kitchen atorvastatin (LIPITOR) 80 MG tablet Take 1 tablet (80 mg total) by mouth daily at 6 PM. 90 tablet 3  . clopidogrel (PLAVIX) 75 MG tablet Take 1 tablet (75 mg total) by mouth daily. 30 tablet 4  . isosorbide mononitrate (IMDUR) 60 MG 24 hr tablet Take 0.5 tablets (30 mg total) by mouth daily. 30 tablet 4  . lisinopril (PRINIVIL,ZESTRIL) 10  MG tablet Take 1 tablet (10 mg total) by mouth daily. 90 tablet 3  . metoprolol tartrate (LOPRESSOR) 25 MG tablet TAKE ONE TABLET BY MOUTH TWICE DAILY 60 tablet 10  . nitroGLYCERIN (NITROSTAT) 0.4 MG SL tablet Place 0.4 mg under the tongue every 5 (five) minutes as needed. For chest pain    . oseltamivir (TAMIFLU) 75 MG capsule Take 1 capsule (75 mg total) by mouth 2 (two) times daily. 10 capsule 0   No current facility-administered medications for this visit.       Objective:    BP 134/82 mmHg  Pulse 104  Temp(Src) 99.2 F (37.3 C) (Oral)  Resp 18  Ht 5\' 9"  (1.753 m)  Wt 183 lb (83.008 kg)  BMI 27.01 kg/m2  SpO2 96% Physical Exam    Constitutional: He is oriented to person, place, and time. He appears well-developed and well-nourished. No distress.  HENT:  Head: Normocephalic and atraumatic.  Right Ear: External ear normal.  Left Ear: External ear normal.  Nose: Rhinorrhea present. Right sinus exhibits no maxillary sinus tenderness and no frontal sinus tenderness. Left sinus exhibits no maxillary sinus tenderness and no frontal sinus tenderness.  Mouth/Throat: Oropharynx is clear and moist.  Eyes: Conjunctivae and EOM are normal. Pupils are equal, round, and reactive to light.  Neck: Normal range of motion. Neck supple. Carotid bruit is not present. No thyromegaly present.  Cardiovascular: Normal rate, regular rhythm, normal heart sounds and intact distal pulses.  Exam reveals no gallop and no friction rub.   No murmur heard. Pulmonary/Chest: Effort normal and breath sounds normal. He has no wheezes. He has no rales.  Abdominal: Soft. Bowel sounds are normal. He exhibits no distension and no mass. There is no tenderness. There is no rebound and no guarding.  Musculoskeletal:       Right shoulder: He exhibits decreased range of motion and pain. He exhibits no tenderness, no bony tenderness, no spasm, normal pulse and normal strength.  R SHOULDER: ABLE TO ELEVATE TO 120 DEGREES.  PAIN WITH ELEVATION ABOVE 120 DEGREES.   Cervical spine: non-tender midline; non-tender paraspinal regions B; full ROM cervical spine without limitation.  Motor 5/5 BUE.  Grip 5/5.   Lymphadenopathy:    He has no cervical adenopathy.  Neurological: He is alert and oriented to person, place, and time. No cranial nerve deficit.  Skin: Skin is warm and dry. No rash noted. He is not diaphoretic.  Psychiatric: He has a normal mood and affect. His behavior is normal.  Nursing note and vitals reviewed.  Results for orders placed or performed in visit on 01/06/15  POCT CBC  Result Value Ref Range   WBC 7.3 4.6 - 10.2 K/uL   Lymph, poc 0.8 0.6 - 3.4    POC LYMPH PERCENT 10.8 10 - 50 %L   MID (cbc) 0.4 0 - 0.9   POC MID % 5.6 0 - 12 %M   POC Granulocyte 6.1 2 - 6.9   Granulocyte percent 83.6 (A) 37 - 80 %G   RBC 5.22 4.69 - 6.13 M/uL   Hemoglobin 15.5 14.1 - 18.1 g/dL   HCT, POC 47.6 43.5 - 53.7 %   MCV 91.3 80 - 97 fL   MCH, POC 29.7 27 - 31.2 pg   MCHC 32.5 31.8 - 35.4 g/dL   RDW, POC 14.2 %   Platelet Count, POC 197 142 - 424 K/uL   MPV 7.0 0 - 99.8 fL  POCT Influenza A/B  Result Value  Ref Range   Influenza A, POC Positive    Influenza B, POC Negative    [facility] reading (PRIMARY) by  Dr. Tamala Julian.  R SHOULDER:  NAD.      Assessment & Plan:   1. Flu-like symptoms   2. Pain in joint, shoulder region, right   3. Influenza A     1.  Influenza A:  New.  Rx for Tamiflu.  Recommend supportive care with rest, fluids, Tylenol.  RTC for acute worsening or SOB.   2.  R shoulder pain/contusion:  New.  Recommend home exercise program daily; injury two months ago.   Meds ordered this encounter  Medications  . oseltamivir (TAMIFLU) 75 MG capsule    Sig: Take 1 capsule (75 mg total) by mouth 2 (two) times daily.    Dispense:  10 capsule    Refill:  0    No Follow-up on file.    Kristi Elayne Guerin, M.D. [facility] [address] [place], [place]  [address] [phone] phone [phone] fax

## 2015-02-03 ENCOUNTER — Other Ambulatory Visit: Payer: Self-pay | Admitting: Cardiovascular Disease

## 2015-04-21 ENCOUNTER — Other Ambulatory Visit: Payer: Self-pay | Admitting: Cardiovascular Disease

## 2015-04-21 NOTE — Telephone Encounter (Signed)
Rx(s) sent to pharmacy electronically.  

## 2015-05-25 ENCOUNTER — Other Ambulatory Visit: Payer: Self-pay | Admitting: Cardiovascular Disease

## 2015-05-27 ENCOUNTER — Other Ambulatory Visit: Payer: Self-pay | Admitting: Cardiovascular Disease

## 2015-05-27 MED ORDER — NITROGLYCERIN 0.4 MG SL SUBL: 0.4000 mg | SUBLINGUAL_TABLET | SUBLINGUAL | Status: DC | PRN

## 2015-05-27 NOTE — Telephone Encounter (Signed)
Rx(s) sent to pharmacy electronically.  

## 2015-05-27 NOTE — Telephone Encounter (Signed)
°  1. Which medications need to be refilled? Nitroglycerin-no chest pain  2. Which pharmacy is medication to be sent to?[facility] on [address] 3. Do they need a 30 day or 90 day supply? 1 bottle  4. Would they like a call back once the medication has been sent to the pharmacy? no

## 2015-06-30 ENCOUNTER — Telehealth: Payer: Self-pay | Admitting: Cardiovascular Disease

## 2015-06-30 NOTE — Telephone Encounter (Signed)
Patient needs appointment before scheduling these tests. His last visit was in September 2015.. Dr. Claiborne Billings at that time wanted the patient to return in 6 months.

## 2015-06-30 NOTE — Telephone Encounter (Signed)
Patient is scheduled with [facility] 07/07/15.

## 2015-06-30 NOTE — Telephone Encounter (Signed)
Thanks he can discuss having these studies with dr. Claiborne Billings at his appointment.

## 2015-06-30 NOTE — Telephone Encounter (Signed)
Left message for patient to call and schedule appt with Dr. Claiborne Billings

## 2015-06-30 NOTE — Telephone Encounter (Signed)
Patient needs to have a routine treadmill and carotid doppler per his employer.  Please enter the orders for these and send to the schedulers.  Thanks.

## 2015-07-07 ENCOUNTER — Ambulatory Visit (INDEPENDENT_AMBULATORY_CARE_PROVIDER_SITE_OTHER): Payer: Medicare HMO | Admitting: Cardiovascular Disease

## 2015-07-07 ENCOUNTER — Encounter: Payer: Self-pay | Admitting: Cardiovascular Disease

## 2015-07-07 VITALS — BP 114/78 | HR 57 | Ht 70.0 in | Wt 177.9 lb

## 2015-07-07 DIAGNOSIS — I25810 Atherosclerosis of coronary artery bypass graft(s) without angina pectoris: Secondary | ICD-10-CM | POA: Diagnosis not present

## 2015-07-07 DIAGNOSIS — N183 Chronic kidney disease, stage 3 unspecified: Secondary | ICD-10-CM

## 2015-07-07 DIAGNOSIS — I6529 Occlusion and stenosis of unspecified carotid artery: Secondary | ICD-10-CM

## 2015-07-07 DIAGNOSIS — Q600 Renal agenesis, unilateral: Secondary | ICD-10-CM

## 2015-07-07 DIAGNOSIS — Z720 Tobacco use: Secondary | ICD-10-CM

## 2015-07-07 DIAGNOSIS — E785 Hyperlipidemia, unspecified: Secondary | ICD-10-CM

## 2015-07-07 DIAGNOSIS — I10 Essential (primary) hypertension: Secondary | ICD-10-CM

## 2015-07-07 NOTE — Patient Instructions (Addendum)
Your physician has requested that you have a carotid duplex. This test is an ultrasound of the carotid arteries in your neck. It looks at blood flow through these arteries that supply the brain with blood. Allow one hour for this exam. There are no restrictions or special instructions.   Your physician has requested that you have en exercise stress myoview. For further information please visit [email or web address]. Please follow instruction sheet, as given.  These test will be done this month. And you will be notified of the results.  Your physician recommends that you schedule a follow-up appointment in: 3 months with dr. Claiborne Billings

## 2015-07-08 ENCOUNTER — Encounter: Payer: Self-pay | Admitting: Cardiovascular Disease

## 2015-07-08 DIAGNOSIS — I10 Essential (primary) hypertension: Secondary | ICD-10-CM | POA: Insufficient documentation

## 2015-07-08 NOTE — Progress Notes (Signed)
Patient ID: Christopher Wilson, male   DOB: 24-Sep-1941, 74 y.o.   MRN: [id number]     HPI:  Christopher Wilson is a 74 year old former patient of Dr. Rollene Fare.  He presents to the office today for one-year cardiology evaluation.  Christopher Wilson has a history of hypertension, and CAD. In 1989 he underwent CABG revascularization surgery at [facility]. In 1999 he underwent bare-metal stenting to his proximal RCA.  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 Nils Pyle.   In 2006 he underwent stenting of his LM and diagonal and a 2007 stenting of the vein graft to the circumflex vessel. Subsequent catheterization showed total occlusion of the vein graft. Catheterization in 2010 showed an ejection fraction of 35-40%. His last catheterization was in August 2013 when he presented with unstable angina/ACS with transient ST changes. He was found to have significant native CAD with a patent left main is stent with mild ostial 20% in-stent narrowing, a patent stent in the diagonal vessel of the LAD with previously documented old occlusion of the LAD after the diagonal vessel and occlusion of the native circumflex and total occlusion of the ostial RCA. There was old occlusion of the vein graft it previously supplied the circumflex. A widely patent vein graft supplying the RCA and a widely patent LIMA graft supplying the LAD. Aggressive medical therapy was recommended. He has a solitary kidney.  He has continued to smoke and is smoking one pack per day.  He is status post carotid surgery and a carotid study done in October 2014 showed patent left carotid endarterectomy. 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   He still works as a Administrator.  He is in need for a DOT physical and as part of that requirement will need subsequent stress test and carotid Doppler imaging prior to his DOT physical.  He denies chest pain.  He denies PND, orthopnea.  He denies claudication.  He is unaware of palpitations.   There is no presyncope.  He presents for one-year evaluation.  Past Medical History  Diagnosis Date  . Coronary artery disease   . Hypertension   . Myocardial infarction   . STEMI (ST elevation myocardial infarction) 06/27/2012  . CAD (coronary artery disease), with CABG 1989 and re-do Cabg 2002 X 3 vesssels.  progressive disease with Stent to Lt. Main, and 1st diag 2006, stent to VG to LCX 2007, with known occlusion history o  06/27/2012  . 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 Hyperlipidemia LDL goal < 70 06/27/2012  . Tobacco abuse 06/27/2012  . CKD (chronic kidney disease) stage 3, GFR 30-59 ml/min 06/27/2012  . ACS (acute coronary syndrome), with ST elevation but stable coronary arteries on cath 06/27/12 06/27/2012  . GERD (gastroesophageal reflux disease)   . Peptic ulcer     Past Surgical History  Procedure Laterality Date  . Coronary angioplasty with stent placement    . Triple bipass      Hx of CABG and RE do  . Coratid arterty surgery    . Coronary artery bypass graft    . Vascular surgery    . Lcea    . Left heart cath N/A 06/27/2012    Procedure: LEFT HEART CATH;  Surgeon: Troy Sine, MD;  Location: [facility] CATH LAB;  Service: Cardiovascular;  Laterality: N/A;    No Known Allergies  Current Outpatient Prescriptions  Medication Sig Dispense Refill  . amLODipine (NORVASC) 5  MG tablet Take 1 tablet (5 mg total) by mouth daily. 90 tablet 2  . aspirin 81 MG chewable tablet Chew 81 mg by mouth daily.    Marland Kitchen atorvastatin (LIPITOR) 80 MG tablet Take 1 tablet (80 mg total) by mouth daily at 6 PM. 90 tablet 3  . clopidogrel (PLAVIX) 75 MG tablet TAKE ONE TABLET BY MOUTH ONCE DAILY 30 tablet 3  . isosorbide mononitrate (IMDUR) 60 MG 24 hr tablet TAKE ONE-HALF TABLET BY MOUTH ONCE DAILY 30 tablet 5  . lisinopril (PRINIVIL,ZESTRIL) 10 MG tablet Take 1 tablet (10 mg total) by mouth daily. 90 tablet 3  . nitroGLYCERIN (NITROSTAT) 0.4 MG SL tablet Place 1 tablet (0.4 mg total) under the  tongue every 5 (five) minutes as needed for chest pain. 25 tablet 3   No current facility-administered medications for this visit.    Socially he is divorced for 25 years. He has 2 children. He is fairly sedentary. 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 He completed 12th grade education. He hasn't been smoking in excess of 50 years. He does drink occasional beer.  Family History  Problem Relation Age of Onset  . Stroke Mother   . Heart attack Father   . Stroke Maternal Aunt     ROS General: Negative; No fevers, chills, or night sweats;  HEENT: Negative; No changes in vision or hearing, sinus congestion, difficulty swallowing Pulmonary: Negative; No cough, wheezing, shortness of breath, hemoptysis Cardiovascular: See history of present illness GI: Positive for GERD; No nausea, vomiting, diarrhea, or abdominal pain GU: Negative; No dysuria, hematuria, or difficulty voiding Musculoskeletal: Negative; no myalgias, joint pain, or weakness Hematologic/Oncology: Negative; no easy bruising, bleeding Endocrine: Negative; no heat/cold intolerance; no diabetes Neuro: Negative; no changes in balance, headaches Skin: Negative; No rashes or skin lesions Psychiatric: Negative; No behavioral problems, depression Sleep: Negative; No snoring, daytime sleepiness, hypersomnolence, bruxism, restless legs, hypnogognic hallucinations, no cataplexy Other comprehensive 14 point system review is negative.  PE BP 114/78 mmHg  Pulse 57  Ht $R'5\' 10"'oj$  (1.778 m)  Wt 177 lb 14.4 oz (80.695 kg)  BMI 25.53 kg/m2  Repeat blood pressure by me 122/72. Wt Readings from Last 3 Encounters:  07/07/15 177 lb 14.4 oz (80.695 kg)  01/06/15 183 lb (83.008 kg)  07/28/14 191 lb 1.6 oz (86.682 kg)   General: Alert, oriented, no distress.  Skin: normal turgor, no rashes HEENT: Normocephalic, atraumatic. Pupils round and reactive; sclera anicteric;  extraocular muscles intact; Fundi mild arterial narrowing. No hemorrhages or exudates; no xanthelasmas  Nose without nasal septal hypertrophy Mouth/Parynx benign; Mallinpatti scale 3 Neck: No JVD, soft carotid bruits bilaterally; normal carotid upstroke Lungs: Diffusely decreased breath sounds. No wheezing.; no wheezing or rales Chest wall: without tenderness to palpitation Heart: RRR, s1 s2 normal  2/6 systolic murmur to left sternal border. No diastolic murmur rubs thrills or heaves.  Abdomen:  ventral hernia;soft, nontender; no hepatosplenomehaly, BS+; abdominal aorta nontender and not dilated by palpation. Back: no CVA tenderness Pulses 2+ Musculoskeletal: No joint discomfort. Extremities: no clubbinbg cyanosis or edema, Homan's sign negative  Neurologic: grossly nonfocal; Cranial nerves grossly wnl Psychologic: Normal mood and affect  ECG (independently read by me): Sinus bradycardia 57 bpm.  Inferior Q waves compatible with old inferior MI.  No significant ST-T changes.  Normal intervals.  September 2015 ECG (independently read by me): Sinus bradycardia 52 beats per minute.  Inferior infarction with Q  waves.  QTC interval 399 ms.  Nonspecific ST changes  March 2015 ECG (independently read by me): Sinus bradycardia 57 beats per minute; inferior Q waves with preserved R waves. Nonspecific ST changes. Early transition.   LABS: BMP Latest Ref Rng 09/06/2014 02/03/2014 07/17/2013  Glucose 70 - 99 mg/dL 97 92 92  BUN 6 - 23 mg/dL _0 Creatinine 0.50 - 1.35 mg/dL 1.80(H) 1.66(H) 1.73(H)  Sodium 135 - 145 mEq/L 139 138 140  Potassium 3.5 - 5.3 mEq/L 4.4 4.2 4.6  Chloride 96 - 112 mEq/L 105 107 104  CO2 19 - 32 mEq/L _1 Calcium 8.4 - 10.5 mg/dL 9.9 9.9 9.9   Hepatic Function Latest Ref Rng 09/06/2014 02/03/2014 07/17/2013  Total Protein 6.0 - 8.3 g/dL 6.7 6.8 6.9  Albumin 3.5 - 5.2 g/dL 4.0 4.0 3.8  AST 0 - 37 U/L _2 ALT 0 - 53 U/L _3 Alk Phosphatase 39 - 117 U/L  79 71 65  Total Bilirubin 0.2 - 1.2 mg/dL 0.6 0.6 0.8   CBC Latest Ref Rng 01/06/2015 09/06/2014 02/03/2014  WBC 4.6 - 10.2 K/uL 7.3 7.6 6.5  Hemoglobin 14.1 - 18.1 g/dL 15.5 15.2 15.0  Hematocrit 43.5 - 53.7 % 47.6 44.5 43.1  Platelets 150 - 400 K/uL - 234 225   Lab Results  Component Value Date   MCV 91.3 01/06/2015   MCV 92.1 09/06/2014   MCV 88.9 02/03/2014   Lab Results  Component Value Date   TSH 1.439 09/06/2014   Lab Results  Component Value Date   HGBA1C 5.8* 06/28/2012   Lipid Panel     Component Value Date/Time   CHOL 158 09/06/2014 1033   TRIG 95 09/06/2014 1033   HDL 44 09/06/2014 1033   CHOLHDL 3.6 09/06/2014 1033   VLDL 19 09/06/2014 1033   [place] 95 09/06/2014 1033     RADIOLOGY: No results found.   ASSESSMENT AND PLAN: Christopher Wilson is a 74 year old occasion male with long-standing cardiac history dating back to 1989 when he underwent his initial CABG revascularization surgery at [facility]. He underwent RCA stenting in 1999. In 2002 he presented with an RV infarction syndrome and required intra-aortic balloon pump counterpulsation support and ultimately underwent CABG revascularization surgery for progressive multivessel CAD. He subsequently has had numerous interventions since that time as outlined above and as documented graft to circumflex with significant native CAD evidence for a patent left main stent, a patent stent in the diagonal vessel, native RCA disease, and a patent vein graft supplying the RCA and LIMA graft to LAD. Unfortunately, he still smokes cigarettes and is smoking one pack per day. Ejection fraction is in the range of 45-50%.  Presently, he denies any anginal symptomatology.  His ECG demonstrates inferior Q waves.  His blood pressure today is controlled on his regimen consisting of lisinopril 10 mg, amlodipine 5 mg.  He is on isosorbide mononitrate 30 mg and is without anginal symptoms.  He continues to be on dual antiplatelets  therapy with aspirin and Plavix.  There is no bleeding.  He is on atorvastatin 80 mg for aggressive lipid-lowering therapy with target LDL less than 70.  He tells me he needs a stress test and carotid Doppler study to be done prior to his DOT physical on October 15.  In light of his significant coronary obstructive disease, prior CABG surgery, and graft occlusion, I am scheduling him for an exercise stress Myoview  study to evaluate potential for ischemia.  We will schedule him for his carotid studies.  These be done over the next several weeks and we will notify him and provide this information so that he can have this for his DOT physical.  He has stage III chronic kidney disease with a creatinine in November 2015 at 1.8.  Smoking cessation was strongly advised.  Laboratory will need to be obtained in the fasting state on his current medical regimen.  I will see him in 3 months for cardiology reevaluation or sooner if problems arise.  Time spent: 25 minutes   Troy Sine, MD, [facility] 07/08/2015 8:03 AM

## 2015-07-22 ENCOUNTER — Telehealth (HOSPITAL_COMMUNITY): Payer: Self-pay

## 2015-07-22 NOTE — Telephone Encounter (Signed)
Encounter complete. 

## 2015-07-27 ENCOUNTER — Ambulatory Visit (HOSPITAL_BASED_OUTPATIENT_CLINIC_OR_DEPARTMENT_OTHER)
Admission: RE | Admit: 2015-07-27 | Discharge: 2015-07-27 | Disposition: A | Discharge status: Home / Self Care | Payer: Medicare HMO | Source: Ambulatory Visit | Attending: Cardiovascular Disease | Admitting: Cardiovascular Disease

## 2015-07-27 ENCOUNTER — Ambulatory Visit (HOSPITAL_COMMUNITY)
Admission: RE | Admit: 2015-07-27 | Discharge: 2015-07-27 | Disposition: A | Discharge status: Home / Self Care | Payer: Medicare HMO | Source: Ambulatory Visit | Attending: Cardiovascular Disease | Admitting: Cardiovascular Disease

## 2015-07-27 DIAGNOSIS — Z8249 Family history of ischemic heart disease and other diseases of the circulatory system: Secondary | ICD-10-CM | POA: Diagnosis not present

## 2015-07-27 DIAGNOSIS — I25810 Atherosclerosis of coronary artery bypass graft(s) without angina pectoris: Secondary | ICD-10-CM | POA: Insufficient documentation

## 2015-07-27 DIAGNOSIS — I6523 Occlusion and stenosis of bilateral carotid arteries: Secondary | ICD-10-CM | POA: Diagnosis not present

## 2015-07-27 DIAGNOSIS — F17200 Nicotine dependence, unspecified, uncomplicated: Secondary | ICD-10-CM | POA: Insufficient documentation

## 2015-07-27 DIAGNOSIS — I6529 Occlusion and stenosis of unspecified carotid artery: Secondary | ICD-10-CM

## 2015-07-27 DIAGNOSIS — I10 Essential (primary) hypertension: Secondary | ICD-10-CM | POA: Insufficient documentation

## 2015-07-27 DIAGNOSIS — R9439 Abnormal result of other cardiovascular function study: Secondary | ICD-10-CM | POA: Diagnosis not present

## 2015-07-27 LAB — MYOCARDIAL PERFUSION IMAGING
CHL CUP MPHR: 147 {beats}/min
CHL RATE OF PERCEIVED EXERTION: 16
CSEPEW: 10.1 METS
CSEPHR: 91 %
CSEPPHR: 134 {beats}/min
Exercise duration (min): 9 min
Exercise duration (sec): 0 s
LVDIAVOL: 97 mL
LVSYSVOL: 46 mL
NUC STRESS TID: 0.7
Rest HR: 61 {beats}/min
SDS: 2
SRS: 19
SSS: 21

## 2015-07-27 MED ORDER — TECHNETIUM TC 99M SESTAMIBI GENERIC - CARDIOLITE
31.4000 | Freq: Once | INTRAVENOUS | Status: AC | PRN
  Administered 2015-07-27: 31.4 via INTRAVENOUS

## 2015-07-27 MED ORDER — TECHNETIUM TC 99M SESTAMIBI GENERIC - CARDIOLITE
10.7000 | Freq: Once | INTRAVENOUS | Status: AC | PRN
  Administered 2015-07-27: 10.7 via INTRAVENOUS

## 2015-07-28 ENCOUNTER — Encounter: Payer: Self-pay | Admitting: Cardiovascular Disease

## 2015-07-28 NOTE — Telephone Encounter (Signed)
Patient is returning a call from [place] regarding his test results

## 2015-07-28 NOTE — Telephone Encounter (Signed)
Patient is reruning a

## 2015-07-29 NOTE — Telephone Encounter (Signed)
This encounter was created in error - please disregard.

## 2015-08-08 ENCOUNTER — Encounter: Payer: Self-pay | Admitting: *Deleted

## 2015-11-08 ENCOUNTER — Other Ambulatory Visit: Payer: Self-pay | Admitting: Cardiovascular Disease

## 2015-11-08 NOTE — Telephone Encounter (Signed)
Rx(s) sent to pharmacy electronically.  

## 2015-11-14 ENCOUNTER — Telehealth: Payer: Self-pay | Admitting: Cardiovascular Disease

## 2015-11-14 MED ORDER — NITROGLYCERIN 0.4 MG SL SUBL: 0.4000 mg | SUBLINGUAL_TABLET | SUBLINGUAL | Status: DC | PRN

## 2015-11-14 MED ORDER — LISINOPRIL 10 MG PO TABS: 10.0000 mg | ORAL_TABLET | Freq: Every day | ORAL | Status: DC

## 2015-11-14 NOTE — Telephone Encounter (Signed)
°*  STAT* If patient is at the pharmacy, call can be transferred to refill team.   1. Which medications need to be refilled? (please list name of each medication and dose if known) Lisinorpril and NTG  2. Which pharmacy/location (including street and city if local pharmacy) is medication to be sent to? [facility] on [address]  3. Do they need a 30 day or 90 day supply? [place]

## 2016-01-18 ENCOUNTER — Other Ambulatory Visit: Payer: Self-pay | Admitting: Cardiovascular Disease

## 2016-01-18 NOTE — Telephone Encounter (Signed)
Rx(s) sent to pharmacy electronically.  

## 2016-02-05 ENCOUNTER — Other Ambulatory Visit: Payer: Self-pay | Admitting: Cardiovascular Disease

## 2016-02-06 NOTE — Telephone Encounter (Signed)
Rx request sent to pharmacy.  

## 2016-04-01 ENCOUNTER — Other Ambulatory Visit: Payer: Self-pay | Admitting: Cardiovascular Disease

## 2016-04-02 NOTE — Telephone Encounter (Signed)
Rx(s) sent to pharmacy electronically.  

## 2016-05-15 ENCOUNTER — Other Ambulatory Visit: Payer: Self-pay | Admitting: Cardiovascular Disease

## 2016-08-17 ENCOUNTER — Other Ambulatory Visit: Payer: Self-pay | Admitting: Cardiovascular Disease

## 2016-08-17 NOTE — Telephone Encounter (Signed)
Rx(s) sent to pharmacy electronically.  

## 2016-08-18 ENCOUNTER — Other Ambulatory Visit: Payer: Self-pay | Admitting: Cardiovascular Disease

## 2016-10-02 ENCOUNTER — Other Ambulatory Visit: Payer: Self-pay | Admitting: Cardiovascular Disease

## 2016-10-02 NOTE — Telephone Encounter (Signed)
Rx has been sent to the pharmacy electronically. ° °

## 2016-11-27 ENCOUNTER — Other Ambulatory Visit: Payer: Self-pay | Admitting: Cardiovascular Disease

## 2016-11-27 NOTE — Telephone Encounter (Signed)
Rx(s) sent to pharmacy electronically.  

## 2016-11-29 ENCOUNTER — Other Ambulatory Visit: Payer: Self-pay | Admitting: Cardiovascular Disease

## 2016-12-18 ENCOUNTER — Other Ambulatory Visit: Payer: Self-pay | Admitting: Cardiovascular Disease

## 2017-01-14 ENCOUNTER — Ambulatory Visit (INDEPENDENT_AMBULATORY_CARE_PROVIDER_SITE_OTHER): Payer: Medicare HMO | Admitting: Cardiovascular Disease

## 2017-01-14 ENCOUNTER — Other Ambulatory Visit: Payer: Self-pay | Admitting: Cardiovascular Disease

## 2017-01-14 ENCOUNTER — Encounter: Payer: Self-pay | Admitting: Cardiovascular Disease

## 2017-01-14 VITALS — BP 130/84 | HR 69 | Ht 70.0 in | Wt 183.0 lb

## 2017-01-14 DIAGNOSIS — I10 Essential (primary) hypertension: Secondary | ICD-10-CM | POA: Diagnosis not present

## 2017-01-14 DIAGNOSIS — I2510 Atherosclerotic heart disease of native coronary artery without angina pectoris: Secondary | ICD-10-CM

## 2017-01-14 DIAGNOSIS — I6529 Occlusion and stenosis of unspecified carotid artery: Secondary | ICD-10-CM | POA: Diagnosis not present

## 2017-01-14 DIAGNOSIS — N183 Chronic kidney disease, stage 3 unspecified: Secondary | ICD-10-CM

## 2017-01-14 DIAGNOSIS — Z720 Tobacco use: Secondary | ICD-10-CM | POA: Diagnosis not present

## 2017-01-14 DIAGNOSIS — Z79899 Other long term (current) drug therapy: Secondary | ICD-10-CM

## 2017-01-14 DIAGNOSIS — E785 Hyperlipidemia, unspecified: Secondary | ICD-10-CM

## 2017-01-14 MED ORDER — NITROGLYCERIN 0.4 MG SL SUBL: 0.4000 mg | SUBLINGUAL_TABLET | SUBLINGUAL | 3 refills | Status: DC | PRN

## 2017-01-14 MED ORDER — LISINOPRIL 10 MG PO TABS: 15.0000 mg | ORAL_TABLET | Freq: Every day | ORAL | 7 refills | Status: DC

## 2017-01-14 NOTE — Progress Notes (Signed)
Patient ID: Christopher Wilson, male   DOB: [birth year], 76 y.o.   MRN: [id number]     HPI:  Christopher Wilson is a 76 year old former patient of Dr. Rollene Fare.  He presents to the office today for an 51 month cardiology evaluation.  [name] has a history of hypertension, and CAD. In 1989 he underwent CABG revascularization surgery at [facility]. In 1999 he underwent bare-metal stenting to his proximal RCA.  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 Nils Pyle.   In 2006 he underwent stenting of his LM and diagonal and a 2007 stenting of the vein graft to the circumflex vessel. Subsequent catheterization showed total occlusion of the vein graft. Catheterization in 2010 showed an ejection fraction of 35-40%. His last catheterization was in August 2013 when he presented with unstable angina/ACS with transient ST changes. He was found to have significant native CAD with a patent left main is stent with mild ostial 20% in-stent narrowing, a patent stent in the diagonal vessel of the LAD with previously documented old occlusion of the LAD after the diagonal vessel and occlusion of the native circumflex and total occlusion of the ostial RCA. There was old occlusion of the vein graft it previously supplied the circumflex. A widely patent vein graft supplying the RCA and a widely patent LIMA graft supplying the LAD. Aggressive medical therapy was recommended. He has a solitary kidney.  He has continued to smoke and is smoking one pack per day.  He is status post carotid surgery and a carotid study done in October 2014 showed patent left carotid endarterectomy. 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   Since I last saw him in September 2016 he continues to work as a Administrator and unfortunately continues to smoke cigarettes at least one pack per day.  Part of his DOT physical requirements, he underwent a/nuclear stress test in September 2016 which revealed a large moderate intensity fixed  inferolateral perfusion defect consistent with scar without associated ischemia.  EF was 53% and there was inferolateral akinesis.  Normal exercise capacity without chest pain.  His last carotid studies were done at the same time and showed heterogeneous plaque bilaterally with stable 40-59% bilateral internal carotid artery stenoses and greater than 50% right external carotid stenosis.  He denies any episodes of chest pain.  He denies significant claudication symptoms but admits to leg cramps while he sleeps.  He has been on amlodipine 5 mg, lisinopril 10 mg, and metoprolol 25 mg twice a day in addition to isosorbide 30 mg daily for blood pressure and his CAD.  He continues to be on dual antiplatelet therapy with aspirin and Plavix.  He is on atorvastatin 80 mg for hyperlipidemia.  He presents for reevaluation.  Past Medical History:  Diagnosis Date  . ACS (acute coronary syndrome), with ST elevation but stable coronary arteries on cath 06/27/12 06/27/2012  . CAD (coronary artery disease), with CABG 1989 and re-do Cabg 2002 X 3 vesssels.  progressive disease with Stent to Lt. Main, and 1st diag 2006, stent to VG to LCX 2007, with known occlusion history o  06/27/2012  . CKD (chronic kidney disease) stage 3, GFR 30-59 ml/min 06/27/2012  . 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 Coronary artery disease   . GERD (gastroesophageal reflux disease)   . Hyperlipidemia LDL goal < 70 06/27/2012  . Hypertension   . Myocardial infarction   . Peptic ulcer   .  STEMI (ST elevation myocardial infarction) ([place]) 06/27/2012  . Tobacco abuse 06/27/2012    Past Surgical History:  Procedure Laterality Date  . coratid arterty surgery    . CORONARY ANGIOPLASTY WITH STENT PLACEMENT    . CORONARY ARTERY BYPASS GRAFT    . LCEA    . LEFT HEART CATH N/A 06/27/2012   Procedure: LEFT HEART CATH;  Surgeon: Troy Sine, MD;  [facility];  Service: Cardiovascular;  Laterality: N/A;  . triple bipass     Hx of CABG  and RE do  . VASCULAR SURGERY      No Known Allergies  Current Outpatient Prescriptions  Medication Sig Dispense Refill  . amLODipine (NORVASC) 5 MG tablet TAKE ONE TABLET BY MOUTH ONCE DAILY **PLEASE  CONTACT  OFFICE  FOR  ADDITIONAL  REFILLS** 15 tablet 0  . aspirin 81 MG chewable tablet Chew 81 mg by mouth daily.    Marland Kitchen atorvastatin (LIPITOR) 80 MG tablet Take 1 tablet (80 mg total) by mouth daily at 6 PM. NEEDS APPOINTMENT FOR FUTURE REFILLS 15 tablet 0  . clopidogrel (PLAVIX) 75 MG tablet TAKE ONE TABLET BY MOUTH ONCE DAILY 15 tablet 0  . isosorbide mononitrate (IMDUR) 60 MG 24 hr tablet TAKE ONE-HALF TABLET BY MOUTH ONCE DAILY 45 tablet 1  . lisinopril (PRINIVIL,ZESTRIL) 10 MG tablet TAKE ONE TABLET BY MOUTH ONCE DAILY 15 tablet 0  . metoprolol tartrate (LOPRESSOR) 25 MG tablet Take 1 tablet (25 mg total) by mouth 2 (two) times daily. PLEASE CONTACT OFFICE FOR ADDITIONAL REFILLS 180 tablet 0  . nitroGLYCERIN (NITROSTAT) 0.4 MG SL tablet Place 1 tablet (0.4 mg total) under the tongue every 5 (five) minutes as needed for chest pain. 25 tablet 3   No current facility-administered medications for this visit.     Socially he is divorced for 25 years. He has 2 children. He is fairly sedentary. 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 He completed 12th grade education. He has been smoking  60 years. He does drink occasional beer.  Family History  Problem Relation Age of Onset  . Stroke Mother   . Heart attack Father   . Stroke Maternal Aunt     ROS General: Negative; No fevers, chills, or night sweats;  HEENT: Negative; No changes in vision or hearing, sinus congestion, difficulty swallowing Pulmonary: Negative; No cough, wheezing, shortness of breath, hemoptysis Cardiovascular: See history of present illness GI: Positive for GERD; No nausea, vomiting, diarrhea, or abdominal pain GU: Negative; No dysuria, hematuria, or  difficulty voiding Musculoskeletal: Negative; no myalgias, joint pain, or weakness Hematologic/Oncology: Negative; no easy bruising, bleeding Endocrine: Negative; no heat/cold intolerance; no diabetes Neuro: Negative; no changes in balance, headaches Skin: Negative; No rashes or skin lesions Psychiatric: Negative; No behavioral problems, depression Sleep: Negative; No snoring, daytime sleepiness, hypersomnolence, bruxism, restless legs, hypnogognic hallucinations, no cataplexy Other comprehensive 14 point system review is negative.  PE BP 130/84   Pulse 69   Ht '5\' 10"'  (1.778 m)   Wt 183 lb (83 kg)   BMI 26.26 kg/m    Repeat blood pressure by me 158/80 and 162/80; he states he did not take his medications this morning  Wt Readings from Last 3 Encounters:  01/14/17 183 lb (83 kg)  07/27/15 177 lb (80.3 kg)  07/07/15 177 lb 14.4 oz (80.7 kg)   General: Alert, oriented, no distress.  Skin: normal turgor, no rashes  HEENT: Normocephalic, atraumatic. Pupils round and reactive; sclera anicteric; extraocular muscles intact; Fundi mild arterial narrowing. No hemorrhages or exudates; no xanthelasmas  Nose without nasal septal hypertrophy Mouth/Parynx benign; Mallinpatti scale 3 Neck: No JVD, soft carotid bruits bilaterally; normal carotid upstroke Lungs: Diffusely decreased breath sounds. No wheezing.; no wheezing or rales Chest wall: without tenderness to palpitation Heart: RRR, s1 s2 normal  2/6 systolic murmur to left sternal border. No diastolic murmur rubs thrills or heaves.  Abdomen:  ventral hernia;soft, nontender; no hepatosplenomehaly, BS+; abdominal aorta nontender and not dilated by palpation. Back: no CVA tenderness Pulses 2+ Musculoskeletal: No joint discomfort. Extremities: no clubbinbg cyanosis or edema, Homan's sign negative  Neurologic: grossly nonfocal; Cranial nerves grossly wnl Psychologic: Normal mood and affect  ECG (independently read by me): Normal sinus rhythm  at 69 bpm.  Old inferior infarct with Q waves inferiorly.  No significant ST segment changes.  Normal intervals.  September 2016 ECG (independently read by me): Sinus bradycardia 57 bpm.  Inferior Q waves compatible with old inferior MI.  No significant ST-T changes.  Normal intervals.  September 2015 ECG (independently read by me): Sinus bradycardia 52 beats per minute.  Inferior infarction with Q waves.  QTC interval 399 ms.  Nonspecific ST changes  March 2015 ECG (independently read by me): Sinus bradycardia 57 beats per minute; inferior Q waves with preserved R waves. Nonspecific ST changes. Early transition.   LABS: BMP Latest Ref Rng & Units 09/06/2014 02/03/2014 07/17/2013  Glucose 70 - 99 mg/dL 97 92 92  BUN 6 - 23 mg/dL '18 17 14  ' Creatinine 0.50 - 1.35 mg/dL 1.80(H) 1.66(H) 1.73(H)  Sodium 135 - 145 mEq/L 139 138 140  Potassium 3.5 - 5.3 mEq/L 4.4 4.2 4.6  Chloride 96 - 112 mEq/L 105 107 104  CO2 19 - 32 mEq/L '24 23 26  ' Calcium 8.4 - 10.5 mg/dL 9.9 9.9 9.9   Hepatic Function Latest Ref Rng & Units 09/06/2014 02/03/2014 07/17/2013  Total Protein 6.0 - 8.3 g/dL 6.7 6.8 6.9  Albumin 3.5 - 5.2 g/dL 4.0 4.0 3.8  AST 0 - 37 U/L '20 17 18  ' ALT 0 - 53 U/L '12 11 12  ' Alk Phosphatase 39 - 117 U/L 79 71 65  Total Bilirubin 0.2 - 1.2 mg/dL 0.6 0.6 0.8   CBC Latest Ref Rng & Units 01/06/2015 09/06/2014 02/03/2014  WBC 4.6 - 10.2 K/uL 7.3 7.6 6.5  Hemoglobin 14.1 - 18.1 g/dL 15.5 15.2 15.0  Hematocrit 43.5 - 53.7 % 47.6 44.5 43.1  Platelets 150 - 400 K/uL - 234 225   Lab Results  Component Value Date   MCV 91.3 01/06/2015   MCV 92.1 09/06/2014   MCV 88.9 02/03/2014   Lab Results  Component Value Date   TSH 1.439 09/06/2014   Lab Results  Component Value Date   HGBA1C 5.8 (H) 06/28/2012   Lipid Panel     Component Value Date/Time   CHOL 158 09/06/2014 1033   TRIG 95 09/06/2014 1033   HDL 44 09/06/2014 1033   CHOLHDL 3.6 09/06/2014 1033   VLDL 19 09/06/2014 1033   [place] 95  09/06/2014 1033     RADIOLOGY: No results found.  IMPRESSION:  1. Coronary artery disease involving native coronary artery of native heart without angina pectoris   2. Essential hypertension   3. Hyperlipidemia with target LDL less than 70   4. Tobacco abuse   5. CKD (chronic kidney disease) stage 3, GFR 30-59 ml/min  6. Medication management    ASSESSMENT AND PLAN: Christopher Wilson is a 76 year old occasion male with long-standing cardiac history dating back to 1989 when he underwent his initial CABG surgery at [facility]. He underwent RCA stenting in 1999. In 2002 he presented with an RV infarction syndrome and required intra-aortic balloon pump counterpulsation support and ultimately underwent CABG revascularization surgery for progressive multivessel CAD. He subsequently has had numerous interventions since that time as outlined above and as documented graft to circumflex with significant native CAD evidence for a patent left main stent, a patent stent in the diagonal vessel, native RCA disease, and a patent vein graft supplying the RCA and LIMA graft to LAD. Unfortunately, he still smokes cigarettes and is smoking one pack per day.  He continues to drive a truck and is required to undergo 2 year follow-up nuclear perfusion studies.  His last evaluation was in September 2016 code evidence for prior inferior scar with an EF of 53%.  He also has bilateral carotid plaque.  States he is now thinking about the possibility that maybe it may be time to stop smoking, but I'm not certain he will do this.  His blood pressure today is elevated and I have further titrated lisinopril to 15 mg.  We will need to monitor his renal function with his previously documented stage III, CKD.  A complete set of fasting laboratory will be obtained and adjustments to his medications will be made.  He continues to be on aspirin and Plavix.  He's not having bleeding. He is not having significant claudication symptoms.   Per his DOT requirements we will schedule him for a nuclear perfusion study as well as 2 year follow-up carotid evaluation in August and I will see him in September 2018 when he will need his DOT requirements completed. Time spent: 25 minutes   Troy Sine, MD, [facility] 01/14/2017 11:42 AM

## 2017-01-14 NOTE — Patient Instructions (Signed)
Your physician has recommended you make the following change in your medication:   1.) the lisinopril has been increased to 15 mg daily. ( 1& 1/2 tablet). A new prescription has been sent to your pharmacy.  Your physician has requested that you have a carotid duplex. This test is an ultrasound of the carotid arteries in your neck. It looks at blood flow through these arteries that supply the brain with blood. Allow one hour for this exam. There are no restrictions or special instructions.  Your physician has requested that you have en exercise stress myoview. For further information please visit [email or web address]. Please follow instruction sheet, as given.  These tests will be done in September.  Your physician recommends that you schedule a follow-up appointment in: September.

## 2017-01-15 NOTE — Telephone Encounter (Signed)
Rx(s) sent to pharmacy electronically.  

## 2017-01-25 ENCOUNTER — Telehealth: Payer: Self-pay | Admitting: Cardiovascular Disease

## 2017-01-25 NOTE — Telephone Encounter (Signed)
Returned call to pharmacy-wanted to verfiy dose of lisinopril.  Advised per OV, lisinopril was increased to 15 mg daily.    Your physician has recommended you make the following change in your medication:   1.) the lisinopril has been increased to 15 mg daily. ( 1& 1/2 tablet). A new prescription has been sent to your pharmacy.   Med list updated.

## 2017-01-25 NOTE — Telephone Encounter (Signed)
°  New Prob  Requesting clarification on Lisinopril directions. Also requesting 90 days for Lisinopril prescription. Please call.

## 2017-02-11 ENCOUNTER — Other Ambulatory Visit: Payer: Self-pay | Admitting: Cardiovascular Disease

## 2017-02-20 ENCOUNTER — Other Ambulatory Visit: Payer: Self-pay | Admitting: Cardiovascular Disease

## 2017-06-20 ENCOUNTER — Other Ambulatory Visit: Payer: Self-pay | Admitting: Cardiovascular Disease

## 2017-07-08 ENCOUNTER — Other Ambulatory Visit: Payer: Self-pay | Admitting: Cardiovascular Disease

## 2017-07-16 ENCOUNTER — Telehealth (HOSPITAL_COMMUNITY): Payer: Self-pay

## 2017-07-16 NOTE — Telephone Encounter (Signed)
Encounter complete. 

## 2017-07-17 ENCOUNTER — Ambulatory Visit (HOSPITAL_COMMUNITY)
Admission: RE | Admit: 2017-07-17 | Discharge: 2017-07-17 | Disposition: A | Discharge status: Home / Self Care | Payer: Medicare HMO | Source: Ambulatory Visit | Attending: Cardiovascular Disease | Admitting: Cardiovascular Disease

## 2017-07-17 ENCOUNTER — Encounter (HOSPITAL_COMMUNITY): Payer: Medicare HMO

## 2017-07-17 DIAGNOSIS — I6529 Occlusion and stenosis of unspecified carotid artery: Secondary | ICD-10-CM | POA: Diagnosis present

## 2017-07-17 DIAGNOSIS — Z8249 Family history of ischemic heart disease and other diseases of the circulatory system: Secondary | ICD-10-CM | POA: Insufficient documentation

## 2017-07-17 DIAGNOSIS — I129 Hypertensive chronic kidney disease with stage 1 through stage 4 chronic kidney disease, or unspecified chronic kidney disease: Secondary | ICD-10-CM | POA: Insufficient documentation

## 2017-07-17 DIAGNOSIS — F17200 Nicotine dependence, unspecified, uncomplicated: Secondary | ICD-10-CM | POA: Insufficient documentation

## 2017-07-17 DIAGNOSIS — I252 Old myocardial infarction: Secondary | ICD-10-CM | POA: Insufficient documentation

## 2017-07-17 DIAGNOSIS — N183 Chronic kidney disease, stage 3 (moderate): Secondary | ICD-10-CM | POA: Diagnosis not present

## 2017-07-17 DIAGNOSIS — Z955 Presence of coronary angioplasty implant and graft: Secondary | ICD-10-CM | POA: Diagnosis not present

## 2017-07-17 DIAGNOSIS — E785 Hyperlipidemia, unspecified: Secondary | ICD-10-CM | POA: Diagnosis not present

## 2017-07-17 DIAGNOSIS — I6523 Occlusion and stenosis of bilateral carotid arteries: Secondary | ICD-10-CM | POA: Diagnosis not present

## 2017-07-17 DIAGNOSIS — I2510 Atherosclerotic heart disease of native coronary artery without angina pectoris: Secondary | ICD-10-CM | POA: Insufficient documentation

## 2017-07-17 DIAGNOSIS — I10 Essential (primary) hypertension: Secondary | ICD-10-CM

## 2017-07-17 DIAGNOSIS — Z951 Presence of aortocoronary bypass graft: Secondary | ICD-10-CM | POA: Insufficient documentation

## 2017-07-17 LAB — MYOCARDIAL PERFUSION IMAGING
CHL CUP MPHR: 145 {beats}/min
CHL CUP NUCLEAR SDS: 1
CHL CUP NUCLEAR SRS: 18
CHL CUP RESTING HR STRESS: 74 {beats}/min
CSEPED: 8 min
CSEPEW: 9.2 METS
CSEPHR: 90 %
CSEPPHR: 131 {beats}/min
Exercise duration (sec): 6 s
LV dias vol: 143 mL (ref 62–150)
LV sys vol: 67 mL
NUC STRESS TID: 0.94
RPE: 17
SSS: 19

## 2017-07-17 MED ORDER — TECHNETIUM TC 99M TETROFOSMIN IV KIT
10.4000 | PACK | Freq: Once | INTRAVENOUS | Status: AC | PRN
  Administered 2017-07-17: 10.4 via INTRAVENOUS
  Filled 2017-07-17: qty 11

## 2017-07-17 MED ORDER — TECHNETIUM TC 99M TETROFOSMIN IV KIT
30.0000 | PACK | Freq: Once | INTRAVENOUS | Status: AC | PRN
  Administered 2017-07-17: 30 via INTRAVENOUS
  Filled 2017-07-17: qty 30

## 2017-08-27 ENCOUNTER — Telehealth: Payer: Self-pay | Admitting: Cardiovascular Disease

## 2017-08-27 NOTE — Telephone Encounter (Signed)
Returned call, no answer, left message to call back.

## 2017-08-27 NOTE — Telephone Encounter (Signed)
Christopher Wilson is returning a call about test results . Thanks

## 2017-08-29 ENCOUNTER — Other Ambulatory Visit: Payer: Self-pay | Admitting: Cardiovascular Disease

## 2017-08-29 MED ORDER — NITROGLYCERIN 0.4 MG SL SUBL: 0.4000 mg | SUBLINGUAL_TABLET | SUBLINGUAL | 3 refills | Status: DC | PRN

## 2017-08-29 NOTE — Telephone Encounter (Signed)
Rx has been sent to the pharmacy electronically. ° °

## 2017-08-29 NOTE — Telephone Encounter (Signed)
Notes recorded by Troy Sine, MD on 07/23/2017 at 9:07 AM EDT Stable nuclear study without significant change from 2 your previous evaluation. Old prior inferior infarct. No ischemia. Okay for DOT clearance. Notes recorded by Fidel Levy, RN on 08/14/2017 at 2:58 PM EDT [facility] on [phone] ([place]) Per last MD note, patient was to follow up in Sept 2018 after testing  [id number]

## 2017-08-29 NOTE — Telephone Encounter (Signed)
pt notified, and f/u appt made

## 2017-10-24 ENCOUNTER — Encounter: Payer: Self-pay | Admitting: *Deleted

## 2017-11-11 ENCOUNTER — Ambulatory Visit: Payer: Medicare HMO | Admitting: Cardiovascular Disease

## 2017-11-20 ENCOUNTER — Other Ambulatory Visit: Payer: Self-pay | Admitting: Cardiovascular Disease

## 2018-02-03 ENCOUNTER — Other Ambulatory Visit: Payer: Self-pay | Admitting: Cardiovascular Disease

## 2018-02-15 ENCOUNTER — Other Ambulatory Visit: Payer: Self-pay | Admitting: Cardiovascular Disease

## 2018-03-18 ENCOUNTER — Other Ambulatory Visit: Payer: Self-pay | Admitting: Cardiovascular Disease

## 2018-03-18 MED ORDER — CLOPIDOGREL BISULFATE 75 MG PO TABS: 75.0000 mg | ORAL_TABLET | Freq: Every day | ORAL | 0 refills | Status: DC

## 2018-03-18 MED ORDER — LISINOPRIL 10 MG PO TABS: 15.0000 mg | ORAL_TABLET | Freq: Every day | ORAL | 0 refills | Status: DC

## 2018-03-18 NOTE — Telephone Encounter (Signed)
°*  STAT* If patient is at the pharmacy, call can be transferred to refill team.   1. Which medications need to be refilled? (please list name of each medication and dose if known) Lisinopril and Clopidogrel--Pt has an appt with Dr Claiborne Billings on this Friday(03-21-18)  2. Which pharmacy/location (including street and city if local pharmacy) is medication to be sent to?[facility] [phone]  3. Do they need a 30 day or 90 day supply? 90 for Lisinopril and 30 for Clopidogre*

## 2018-03-21 ENCOUNTER — Encounter: Payer: Self-pay | Admitting: Cardiovascular Disease

## 2018-03-21 ENCOUNTER — Ambulatory Visit: Payer: Medicare HMO | Admitting: Cardiovascular Disease

## 2018-03-21 VITALS — BP 112/64 | HR 83 | Ht 70.0 in | Wt 160.0 lb

## 2018-03-21 DIAGNOSIS — I10 Essential (primary) hypertension: Secondary | ICD-10-CM

## 2018-03-21 DIAGNOSIS — R630 Anorexia: Secondary | ICD-10-CM

## 2018-03-21 DIAGNOSIS — Z79899 Other long term (current) drug therapy: Secondary | ICD-10-CM

## 2018-03-21 DIAGNOSIS — R0602 Shortness of breath: Secondary | ICD-10-CM | POA: Diagnosis not present

## 2018-03-21 DIAGNOSIS — Z720 Tobacco use: Secondary | ICD-10-CM

## 2018-03-21 DIAGNOSIS — R634 Abnormal weight loss: Secondary | ICD-10-CM

## 2018-03-21 DIAGNOSIS — I2510 Atherosclerotic heart disease of native coronary artery without angina pectoris: Secondary | ICD-10-CM | POA: Diagnosis not present

## 2018-03-21 DIAGNOSIS — R197 Diarrhea, unspecified: Secondary | ICD-10-CM

## 2018-03-21 DIAGNOSIS — E785 Hyperlipidemia, unspecified: Secondary | ICD-10-CM | POA: Diagnosis not present

## 2018-03-21 LAB — CBC
HEMOGLOBIN: 12 g/dL — AB (ref 13.0–17.7)
Hematocrit: 36.8 % — ABNORMAL LOW (ref 37.5–51.0)
MCH: 29.7 pg (ref 26.6–33.0)
MCHC: 32.6 g/dL (ref 31.5–35.7)
MCV: 91 fL (ref 79–97)
Platelets: 270 10*3/uL (ref 150–450)
RBC: 4.04 x10E6/uL — AB (ref 4.14–5.80)
RDW: 13.9 % (ref 12.3–15.4)
WBC: 6.1 10*3/uL (ref 3.4–10.8)

## 2018-03-21 LAB — COMPREHENSIVE METABOLIC PANEL
ALT: 9 IU/L (ref 0–44)
AST: 13 IU/L (ref 0–40)
Albumin/Globulin Ratio: 1.2 (ref 1.2–2.2)
Albumin: 3.8 g/dL (ref 3.5–4.8)
Alkaline Phosphatase: 119 IU/L — ABNORMAL HIGH (ref 39–117)
BUN/Creatinine Ratio: 17 (ref 10–24)
BUN: 45 mg/dL — ABNORMAL HIGH (ref 8–27)
Bilirubin Total: 0.4 mg/dL (ref 0.0–1.2)
CO2: 17 mmol/L — AB (ref 20–29)
CREATININE: 2.6 mg/dL — AB (ref 0.76–1.27)
Calcium: 9.3 mg/dL (ref 8.6–10.2)
Chloride: 107 mmol/L — ABNORMAL HIGH (ref 96–106)
GFR calc Af Amer: 27 mL/min/{1.73_m2} — ABNORMAL LOW (ref >59)
GFR calc non Af Amer: 23 mL/min/{1.73_m2} — ABNORMAL LOW (ref >59)
GLUCOSE: 96 mg/dL (ref 65–99)
Globulin, Total: 3.1 g/dL (ref 1.5–4.5)
POTASSIUM: 4.8 mmol/L (ref 3.5–5.2)
Sodium: 140 mmol/L (ref 134–144)
Total Protein: 6.9 g/dL (ref 6.0–8.5)

## 2018-03-21 LAB — LIPID PANEL
CHOLESTEROL TOTAL: 171 mg/dL (ref 100–199)
Chol/HDL Ratio: 4.2 ratio (ref 0.0–5.0)
HDL: 41 mg/dL (ref >39)
LDL CALC: 113 mg/dL — AB (ref 0–99)
Triglycerides: 84 mg/dL (ref 0–149)
VLDL CHOLESTEROL CAL: 17 mg/dL (ref 5–40)

## 2018-03-21 LAB — TSH: TSH: 1.05 u[IU]/mL (ref 0.450–4.500)

## 2018-03-21 NOTE — Patient Instructions (Signed)
Medication Instructions:  Your physician recommends that you continue on your current medications as directed. Please refer to the Current Medication list given to you today.  Labwork: Please return for FASTING labs (CMET, CBC, Lipid, TSH)  Our in office lab hours are Monday-Friday 8:00-4:00, closed for lunch 12:45-1:45 pm.  No appointment needed.  Testing/Procedures: Your physician has requested that you have an echocardiogram. Echocardiography is a painless test that uses sound waves to create images of your heart. It provides your doctor with information about the size and shape of your heart and how well your heart's chambers and valves are working. This procedure takes approximately one hour. There are no restrictions for this procedure.  This will be done at our [facility] location:  [place] has requested that you have a chest CT: Non-Cardiac CT scanning, (CAT scanning), is a noninvasive, special x-ray that produces cross-sectional images of the body using x-rays and a computer. CT scans help physicians diagnose and treat medical conditions. For some CT exams, a contrast material is used to enhance visibility in the area of the body being studied. CT scans provide greater clarity and reveal more details than regular x-ray exams.  Follow-Up: You have been referred to: Gastroenterology  4 months with Dr. Claiborne Billings    Any Other Special Instructions Will Be Listed Below (If Applicable).     If you need a refill on your cardiac medications before your next appointment, please call your pharmacy.

## 2018-03-21 NOTE — Progress Notes (Signed)
Patient ID: Christopher Wilson, male   DOB: 1941/04/20, 77 y.o.   MRN: [id number]     HPI:  Christopher Wilson is a 77 year old former patient of Dr. Rollene Fare.  He presents to the office today for an 77 month cardiology evaluation.  [name] has a history of hypertension, and CAD. In 1989 he underwent CABG revascularization surgery at [facility]. In 1999 he underwent bare-metal stenting to his proximal RCA.  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 Nils Pyle.   In 2006 he underwent stenting of his LM and diagonal and a 2007 stenting of the vein graft to the circumflex vessel. Subsequent catheterization showed total occlusion of the vein graft. Catheterization in 2010 showed an ejection fraction of 35-40%. His last catheterization was in August 2013 when he presented with unstable angina/ACS with transient ST changes. He was found to have significant native CAD with a patent left main is stent with mild ostial 20% in-stent narrowing, a patent stent in the diagonal vessel of the LAD with previously documented old occlusion of the LAD after the diagonal vessel and occlusion of the native circumflex and total occlusion of the ostial RCA. There was old occlusion of the vein graft it previously supplied the circumflex. A widely patent vein graft supplying the RCA and a widely patent LIMA graft supplying the LAD. Aggressive medical therapy was recommended. He has a solitary kidney.  He has continued to smoke and is smoking one pack per day.  He is status post carotid surgery and a carotid study done in October 2014 showed patent left carotid endarterectomy. 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   Since I last saw him in September 2016 he continues to work as a Administrator and unfortunately continues to smoke cigarettes at least one pack per day.  Part of his DOT physical requirements, he underwent a/nuclear stress test in September 2016 which revealed a large moderate intensity fixed  inferolateral perfusion defect consistent with scar without associated ischemia.  EF was 53% and there was inferolateral akinesis.  Normal exercise capacity without chest pain.  His last carotid studies were done at the same time and showed heterogeneous plaque bilaterally with stable 40-59% bilateral internal carotid artery stenoses and greater than 50% right external carotid stenosis.  Since I last saw him in March 2018 he states that beginning in September 2018 he has begun to lose weight.  In September 2018 he weighed 182 pounds and his weight has dropped to 160 pounds.  Over the past 2 to 3 months he has noticed watery diarrhea with each bowel movement.  He admits to feeling exhausted.  He is always tired.  Unfortunately he is still smoking and has been smoking for over 60 years.  He admits to increasing shortness of breath.  He is not eating well but states he is not hungry.  He does note occasional wheezes.  He presents for evaluation.   Past Medical History:  Diagnosis Date  . ACS (acute coronary syndrome), with ST elevation but stable coronary arteries on cath 06/27/12 06/27/2012  . CAD (coronary artery disease), with CABG 1989 and re-do Cabg 2002 X 3 vesssels.  progressive disease with Stent to Lt. Main, and 1st diag 2006, stent to VG to LCX 2007, with known occlusion history o  06/27/2012  . CKD (chronic kidney disease) stage 3, GFR 30-59 ml/min (HCC) 06/27/2012  . 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 Coronary artery  disease   . GERD (gastroesophageal reflux disease)   . Hyperlipidemia LDL goal < 70 06/27/2012  . Hypertension   . Myocardial infarction ([place])   . Peptic ulcer   . STEMI (ST elevation myocardial infarction) ([place]) 06/27/2012  . Tobacco abuse 06/27/2012    Past Surgical History:  Procedure Laterality Date  . coratid arterty surgery    . CORONARY ANGIOPLASTY WITH STENT PLACEMENT    . CORONARY ARTERY BYPASS GRAFT    . LCEA    . LEFT HEART CATH N/A 06/27/2012   Procedure: LEFT  HEART CATH;  Surgeon: Troy Sine, MD;  Location: [facility] CATH LAB;  Service: Cardiovascular;  Laterality: N/A;  . triple bipass     Hx of CABG and RE do  . VASCULAR SURGERY      No Known Allergies  Current Outpatient Medications  Medication Sig Dispense Refill  . amLODipine (NORVASC) 5 MG tablet TAKE 1 TABLET BY MOUTH ONCE DAILY 90 tablet 0  . aspirin 81 MG chewable tablet Chew 81 mg by mouth daily.    Marland Kitchen atorvastatin (LIPITOR) 80 MG tablet TAKE ONE TABLET BY MOUTH DAILY AT 6 P.M. NEEDS APPOINTMENT FOR FUTURE REFILLS. 90 tablet 3  . clopidogrel (PLAVIX) 75 MG tablet Take 1 tablet (75 mg total) by mouth daily. KEEP OV. 30 tablet 0  . isosorbide mononitrate (IMDUR) 60 MG 24 hr tablet TAKE ONE-HALF TABLET BY MOUTH ONCE DAILY (PLEASE  MAKE  APPOINTMENT  FOR  FURTHER  REFILLS) 45 tablet 1  . lisinopril (PRINIVIL,ZESTRIL) 10 MG tablet Take 1.5 tablets (15 mg total) by mouth daily. KEEP OV. 135 tablet 0  . metoprolol tartrate (LOPRESSOR) 25 MG tablet TAKE ONE TABLET BY MOUTH TWICE DAILY (PLEASE  CONTACT  OFFICE  FOR  ADDITIONAL  REFILLS  ) 180 tablet 2  . nitroGLYCERIN (NITROSTAT) 0.4 MG SL tablet Place 1 tablet (0.4 mg total) under the tongue every 5 (five) minutes as needed for chest pain. 25 tablet 3   No current facility-administered medications for this visit.     Socially he is divorced for 25 years. He has 2 children. He is fairly sedentary. 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 He completed 12th grade education. He has been smoking  60 years. He does drink occasional beer.  Family History  Problem Relation Age of Onset  . Stroke Mother   . Heart attack Father   . Stroke Maternal Aunt     ROS General: Negative; No fevers, chills, or night sweats; positive for weight loss  HEENT: Negative; No changes in vision or hearing, sinus congestion, difficulty swallowing Pulmonary: Negative; No cough, wheezing, shortness of  breath, hemoptysis Cardiovascular: See history of present illness GI: History of GERD, positive for diarrhea for 2 to 3 months. GU: Negative; No dysuria, hematuria, or difficulty voiding Musculoskeletal: Negative; no myalgias, joint pain, or weakness Hematologic/Oncology: Negative; no easy bruising, bleeding Endocrine: Negative; no heat/cold intolerance; no diabetes Neuro: Negative; no changes in balance, headaches Skin: Negative; No rashes or skin lesions Psychiatric: Negative; No behavioral problems, depression Sleep: Negative; No snoring, daytime sleepiness, hypersomnolence, bruxism, restless legs, hypnogognic hallucinations, no cataplexy Other comprehensive 14 point system review is negative.  PE BP 112/64   Pulse 83   Ht _0  (1.778 m)   Wt 160 lb (72.6 kg)   BMI 22.96 kg/m    Repeat blood pressure by me was 120/68  Wt Readings from Last  3 Encounters:  03/21/18 160 lb (72.6 kg)  07/17/17 183 lb (83 kg)  01/14/17 183 lb (83 kg)   General: Alert, oriented, no distress.  Skin: normal turgor, no rashes, warm and dry HEENT: Normocephalic, atraumatic. Pupils equal round and reactive to light; sclera anicteric; extraocular muscles intact;  Nose without nasal septal hypertrophy Mouth/Parynx benign; Mallinpatti scale 2 Neck: No JVD, no carotid bruits; normal carotid upstroke Lungs:  decreased breath sounds without wheezing. Chest wall: without tenderness to palpitation Heart: PMI not displaced, RRR, s1 s2 normal, 1/6 systolic murmur, no diastolic murmur, no rubs, gallops, thrills, or heaves Abdomen: Soft ventral hernia, nontender; no hepatosplenomehaly, BS+; abdominal aorta nontender and not dilated by palpation. Back: no CVA tenderness Pulses 2+ Musculoskeletal: full range of motion, normal strength, no joint deformities Extremities: no clubbing cyanosis or edema, Homan's sign negative  Neurologic: grossly nonfocal; Cranial nerves grossly wnl Psychologic: Normal mood and  affect    ECG (independently read by me): Sinus rhythm with sinus arrhythmia and isolated PVC.  Ventricular rate 83 bpm.  Normal intervals.  No ST segment changes.  March 2018 ECG (independently read by me): Normal sinus rhythm at 69 bpm.  Old inferior infarct with Q waves inferiorly.  No significant ST segment changes.  Normal intervals.  September 2016 ECG (independently read by me): Sinus bradycardia 57 bpm.  Inferior Q waves compatible with old inferior MI.  No significant ST-T changes.  Normal intervals.  September 2015 ECG (independently read by me): Sinus bradycardia 52 beats per minute.  Inferior infarction with Q waves.  QTC interval 399 ms.  Nonspecific ST changes  March 2015 ECG (independently read by me): Sinus bradycardia 57 beats per minute; inferior Q waves with preserved R waves. Nonspecific ST changes. Early transition.   LABS: BMP Latest Ref Rng & Units 03/21/2018 09/06/2014 02/03/2014  Glucose 65 - 99 mg/dL 96 97 92  BUN 8 - 27 mg/dL 45(H) 18 17  Creatinine 0.76 - 1.27 mg/dL 2.60(H) 1.80(H) 1.66(H)  BUN/Creat Ratio 10 - 24 17 - -  Sodium 134 - 144 mmol/L 140 139 138  Potassium 3.5 - 5.2 mmol/L 4.8 4.4 4.2  Chloride 96 - 106 mmol/L 107(H) 105 107  CO2 20 - 29 mmol/L 17(L) 24 23  Calcium 8.6 - 10.2 mg/dL 9.3 9.9 9.9   Hepatic Function Latest Ref Rng & Units 03/21/2018 09/06/2014 02/03/2014  Total Protein 6.0 - 8.5 g/dL 6.9 6.7 6.8  Albumin 3.5 - 4.8 g/dL 3.8 4.0 4.0  AST 0 - 40 IU/L _0 ALT 0 - 44 IU/L _1 Alk Phosphatase 39 - 117 IU/L 119(H) 79 71  Total Bilirubin 0.0 - 1.2 mg/dL 0.4 0.6 0.6   CBC Latest Ref Rng & Units 03/21/2018 01/06/2015 09/06/2014  WBC 3.4 - 10.8 x10E3/uL 6.1 7.3 7.6  Hemoglobin 13.0 - 17.7 g/dL 12.0(L) 15.5 15.2  Hematocrit 37.5 - 51.0 % 36.8(L) 47.6 44.5  Platelets 150 - 450 x10E3/uL 270 - 234   Lab Results  Component Value Date   MCV 91 03/21/2018   MCV 91.3 01/06/2015   MCV 92.1 09/06/2014   Lab Results  Component Value Date     TSH 1.050 03/21/2018   Lab Results  Component Value Date   HGBA1C 5.8 (H) 06/28/2012   Lipid Panel     Component Value Date/Time   CHOL 171 03/21/2018 1048   TRIG 84 03/21/2018 1048   HDL 41 03/21/2018 1048   CHOLHDL 4.2 03/21/2018 1048  CHOLHDL 3.6 09/06/2014 1033   VLDL 19 09/06/2014 1033   LDLCALC 113 (H) 03/21/2018 1048     RADIOLOGY: No results found.  IMPRESSION:  1. Shortness of breath   2. Essential hypertension   3. Medication management   4. Coronary artery disease involving native coronary artery of native heart without angina pectoris   5. Hyperlipidemia with target LDL less than 70   6. Tobacco abuse   7. Unexplained weight loss   8. Decreased appetite   9. Diarrhea, unspecified type    ASSESSMENT AND PLAN: Mr. Dam Wilson is a 77 year old occasion male with long-standing cardiac history dating back to 1989 when he underwent his initial CABG surgery at [facility]. He underwent RCA stenting in 1999. In 2002 he presented with an RV infarction syndrome and required intra-aortic balloon pump counterpulsation support and ultimately underwent CABG revascularization surgery for progressive multivessel CAD. He subsequently has had numerous interventions since that time as outlined above and as documented graft to circumflex with significant native CAD evidence for a patent left main stent, a patent stent in the diagonal vessel, native RCA disease, and a patent vein graft supplying the RCA and LIMA graft to LAD. Unfortunately, he still smokes cigarettes and is smoking one pack per day.  As part of his DOT requirements, he underwent a nuclear stress test in September 2018 which was stable from prior evaluations and showed old prior inferior infarct without ischemia.  EF was 53%.  He was given clearance for his DOT.  Carotid imaging in September 2018 showed stable 40 to 50% right internal carotid stenosis and 1 at 39% left internal carotid stenosis with greater than 50%  bilateral external carotid stenoses.  Since September 2018, he has lost 22 pounds.  Over the last several months he has had recurrent episodes of watery diarrhea.  He has lost his appetite.  He is not eating well.  I am recommending fasting laboratory be obtained.  He denies any definitive angina but admits to shortness of breath with activity.  I am scheduling him for an echo Doppler study to reassess systolic and diastolic function.  With his long tobacco history, I am also scheduling him for CT of his chest.  I have strongly recommended GI evaluation in light of his weight loss, and persistent diarrhea.  We again discussed the importance of smoking cessation.  I will see him in 3 to 4 months for reevaluation or sooner if problems arise.  Time spent: 25 minutes   Troy Sine, MD, [facility] 03/23/2018 4:15 PM

## 2018-03-23 ENCOUNTER — Encounter: Payer: Self-pay | Admitting: Cardiovascular Disease

## 2018-03-24 ENCOUNTER — Emergency Department (HOSPITAL_BASED_OUTPATIENT_CLINIC_OR_DEPARTMENT_OTHER)
Admission: EM | Admit: 2018-03-24 | Discharge: 2018-03-24 | Discharge status: Against Medical Advice | Payer: Medicare HMO | Attending: Emergency Medicine | Admitting: Emergency Medicine

## 2018-03-24 ENCOUNTER — Encounter (HOSPITAL_BASED_OUTPATIENT_CLINIC_OR_DEPARTMENT_OTHER): Payer: Self-pay

## 2018-03-24 ENCOUNTER — Emergency Department (HOSPITAL_BASED_OUTPATIENT_CLINIC_OR_DEPARTMENT_OTHER): Payer: Medicare HMO

## 2018-03-24 ENCOUNTER — Other Ambulatory Visit: Payer: Self-pay

## 2018-03-24 DIAGNOSIS — R0602 Shortness of breath: Secondary | ICD-10-CM | POA: Insufficient documentation

## 2018-03-24 DIAGNOSIS — Z955 Presence of coronary angioplasty implant and graft: Secondary | ICD-10-CM | POA: Insufficient documentation

## 2018-03-24 DIAGNOSIS — Z79899 Other long term (current) drug therapy: Secondary | ICD-10-CM | POA: Insufficient documentation

## 2018-03-24 DIAGNOSIS — N179 Acute kidney failure, unspecified: Secondary | ICD-10-CM

## 2018-03-24 DIAGNOSIS — I2510 Atherosclerotic heart disease of native coronary artery without angina pectoris: Secondary | ICD-10-CM | POA: Insufficient documentation

## 2018-03-24 DIAGNOSIS — R918 Other nonspecific abnormal finding of lung field: Secondary | ICD-10-CM | POA: Insufficient documentation

## 2018-03-24 DIAGNOSIS — Z7982 Long term (current) use of aspirin: Secondary | ICD-10-CM | POA: Diagnosis not present

## 2018-03-24 DIAGNOSIS — I129 Hypertensive chronic kidney disease with stage 1 through stage 4 chronic kidney disease, or unspecified chronic kidney disease: Secondary | ICD-10-CM | POA: Insufficient documentation

## 2018-03-24 DIAGNOSIS — I252 Old myocardial infarction: Secondary | ICD-10-CM | POA: Insufficient documentation

## 2018-03-24 DIAGNOSIS — J90 Pleural effusion, not elsewhere classified: Secondary | ICD-10-CM | POA: Diagnosis not present

## 2018-03-24 DIAGNOSIS — F17200 Nicotine dependence, unspecified, uncomplicated: Secondary | ICD-10-CM | POA: Diagnosis not present

## 2018-03-24 DIAGNOSIS — N183 Chronic kidney disease, stage 3 (moderate): Secondary | ICD-10-CM | POA: Insufficient documentation

## 2018-03-24 DIAGNOSIS — R197 Diarrhea, unspecified: Secondary | ICD-10-CM | POA: Insufficient documentation

## 2018-03-24 DIAGNOSIS — Z7902 Long term (current) use of antithrombotics/antiplatelets: Secondary | ICD-10-CM | POA: Diagnosis not present

## 2018-03-24 DIAGNOSIS — J189 Pneumonia, unspecified organism: Secondary | ICD-10-CM | POA: Diagnosis not present

## 2018-03-24 DIAGNOSIS — R1110 Vomiting, unspecified: Secondary | ICD-10-CM | POA: Diagnosis present

## 2018-03-24 LAB — URINALYSIS, ROUTINE W REFLEX MICROSCOPIC
Bilirubin Urine: NEGATIVE
Glucose, UA: NEGATIVE mg/dL
Ketones, ur: NEGATIVE mg/dL
NITRITE: NEGATIVE
PROTEIN: 30 mg/dL — AB
Specific Gravity, Urine: >1.03 — ABNORMAL HIGH (ref 1.005–1.030)
pH: 5.5 (ref 5.0–8.0)

## 2018-03-24 LAB — CBC WITH DIFFERENTIAL/PLATELET
BASOS ABS: 0 10*3/uL (ref 0.0–0.1)
BASOS PCT: 0 %
EOS ABS: 0.2 10*3/uL (ref 0.0–0.7)
Eosinophils Relative: 3 %
HCT: 35.3 % — ABNORMAL LOW (ref 39.0–52.0)
HEMOGLOBIN: 11.5 g/dL — AB (ref 13.0–17.0)
LYMPHS PCT: 12 %
Lymphs Abs: 0.7 10*3/uL (ref 0.7–4.0)
MCH: 30.7 pg (ref 26.0–34.0)
MCHC: 32.6 g/dL (ref 30.0–36.0)
MCV: 94.4 fL (ref 78.0–100.0)
MONOS PCT: 9 %
Monocytes Absolute: 0.5 10*3/uL (ref 0.1–1.0)
NEUTROS ABS: 4.3 10*3/uL (ref 1.7–7.7)
NEUTROS PCT: 76 %
PLATELETS: 277 10*3/uL (ref 150–400)
RBC: 3.74 MIL/uL — AB (ref 4.22–5.81)
RDW: 14.2 % (ref 11.5–15.5)
WBC: 5.7 10*3/uL (ref 4.0–10.5)

## 2018-03-24 LAB — COMPREHENSIVE METABOLIC PANEL
ALBUMIN: 3.1 g/dL — AB (ref 3.5–5.0)
ALT: 13 U/L — AB (ref 17–63)
AST: 18 U/L (ref 15–41)
Alkaline Phosphatase: 97 U/L (ref 38–126)
Anion gap: 8 (ref 5–15)
BUN: 37 mg/dL — AB (ref 6–20)
CALCIUM: 9 mg/dL (ref 8.9–10.3)
CHLORIDE: 114 mmol/L — AB (ref 101–111)
CO2: 20 mmol/L — AB (ref 22–32)
CREATININE: 2.12 mg/dL — AB (ref 0.61–1.24)
GFR calc Af Amer: 33 mL/min — ABNORMAL LOW (ref >60)
GFR calc non Af Amer: 29 mL/min — ABNORMAL LOW (ref >60)
GLUCOSE: 107 mg/dL — AB (ref 65–99)
Potassium: 5.5 mmol/L — ABNORMAL HIGH (ref 3.5–5.1)
SODIUM: 142 mmol/L (ref 135–145)
Total Bilirubin: 0.6 mg/dL (ref 0.3–1.2)
Total Protein: 6.9 g/dL (ref 6.5–8.1)

## 2018-03-24 LAB — I-STAT CG4 LACTIC ACID, ED: LACTIC ACID, VENOUS: 1.29 mmol/L (ref 0.5–1.9)

## 2018-03-24 LAB — BRAIN NATRIURETIC PEPTIDE: B Natriuretic Peptide: 147.3 pg/mL — ABNORMAL HIGH (ref 0.0–100.0)

## 2018-03-24 LAB — URINALYSIS, MICROSCOPIC (REFLEX)

## 2018-03-24 LAB — TROPONIN I: Troponin I: <0.03 ng/mL (ref <0.03)

## 2018-03-24 LAB — LIPASE, BLOOD: Lipase: 39 U/L (ref 11–51)

## 2018-03-24 MED ORDER — AEROCHAMBER PLUS FLO-VU MEDIUM MISC
1.0000 | Freq: Once | Status: DC
  Filled 2018-03-24: qty 1

## 2018-03-24 MED ORDER — AZITHROMYCIN 250 MG PO TABS
500.0000 mg | ORAL_TABLET | Freq: Once | ORAL | Status: AC
  Administered 2018-03-24: 500 mg via ORAL
  Filled 2018-03-24: qty 2

## 2018-03-24 MED ORDER — AZITHROMYCIN 250 MG PO TABS: ORAL_TABLET | ORAL | 0 refills | Status: DC

## 2018-03-24 MED ORDER — IPRATROPIUM-ALBUTEROL 0.5-2.5 (3) MG/3ML IN SOLN
3.0000 mL | Freq: Once | RESPIRATORY_TRACT | Status: AC
  Administered 2018-03-24: 3 mL via RESPIRATORY_TRACT
  Filled 2018-03-24: qty 3

## 2018-03-24 MED ORDER — SODIUM CHLORIDE 0.9 % IV SOLN
Freq: Once | INTRAVENOUS | Status: AC
  Administered 2018-03-24: 17:00:00 via INTRAVENOUS

## 2018-03-24 MED ORDER — SODIUM CHLORIDE 0.9 % IV SOLN
500.0000 mg | Freq: Once | INTRAVENOUS | Status: DC
  Filled 2018-03-24: qty 500

## 2018-03-24 MED ORDER — CEFTRIAXONE SODIUM 1 G IJ SOLR
1.0000 g | Freq: Once | INTRAMUSCULAR | Status: AC
  Administered 2018-03-24: 1 g via INTRAVENOUS
  Filled 2018-03-24: qty 10

## 2018-03-24 MED ORDER — ALBUTEROL SULFATE HFA 108 (90 BASE) MCG/ACT IN AERS
2.0000 | INHALATION_SPRAY | Freq: Once | RESPIRATORY_TRACT | Status: AC
Start: 2018-03-24 — End: 2018-03-24
  Administered 2018-03-24: 2 via RESPIRATORY_TRACT
  Filled 2018-03-24: qty 6.7

## 2018-03-24 MED ORDER — SODIUM CHLORIDE 0.9 % IV BOLUS (SEPSIS): 1000.0000 mL | Freq: Once | INTRAVENOUS | Status: DC

## 2018-03-24 MED ORDER — AZITHROMYCIN 500 MG IV SOLR
INTRAVENOUS | Status: AC
  Filled 2018-03-24: qty 500

## 2018-03-24 MED ORDER — AMOXICILLIN-POT CLAVULANATE 875-125 MG PO TABS: 1.0000 | ORAL_TABLET | Freq: Two times a day (BID) | ORAL | 0 refills | Status: DC

## 2018-03-24 NOTE — ED Notes (Signed)
Pt reports 3-4 weeks of watery stools. States that he has been to his PCP and was referred to a GI doctor for that. Also reports that he has had SOB and general fatigue x 1 week. Went to PCP yesterday and was scheduled for a cardiac doppler and 'chest CT scan' for next week. Pt SOB when talking in room. Denies CP. EKG being obtained.

## 2018-03-24 NOTE — ED Triage Notes (Signed)
C/o SOB, fatigue x 1 week-diarrhea x 1 month-lost 22 lbs since Jan 1-dyspnea noted when entered triage

## 2018-03-24 NOTE — ED Provider Notes (Addendum)
[facility] EMERGENCY DEPARTMENT Provider Note   CSN: [id number] Arrival date & time: 03/24/18  1509     History   Chief Complaint Chief Complaint  Patient presents with  . Shortness of Breath    HPI Christopher Wilson is a 77 y.o. male.  HPI Patient reports he is not been well for at least several weeks.  He has had diarrhea that is been ongoing for several weeks.  He reports it did just slow down the past couple days when he started a probiotic.  He reports he had significant loss of appetite.  He has had very occasional episodes of vomiting.  Does not happen routinely.  He reports however he has had no appetite and since 3 days ago has only been drinking some juice and fluids but not eating.  Reports he has lost 22 pounds in the past month.  Patient also reports he got very short of breath.  He denies associated chest pain.  He initially described the shortness of breath as being of longer duration but apparently got much worse as of about 2 days ago.  Patient does have long smoking history but has been trying to quit.  He denies that he has been coughing or bringing up mucus.  He reports occasionally he thinks he feels a little warm to the touch but does not think he has had any high fevers.  His impression is that he got "a bug".  And is never been able to shake it.  Reports a lack of energy and fatigue is very atypical for him.  He saw his cardiologist last Friday and mention some of the symptoms.  He reports he scheduled him to get a CT scan of the chest.  It is not been done yet.  He reports that his shortness of breath however got worse since he was seen several days ago. Past Medical History:  Diagnosis Date  . ACS (acute coronary syndrome), with ST elevation but stable coronary arteries on cath 06/27/12 06/27/2012  . CAD (coronary artery disease), with CABG 1989 and re-do Cabg 2002 X 3 vesssels.  progressive disease with Stent to Lt. Main, and 1st diag 2006, stent to VG to LCX  2007, with known occlusion history o  06/27/2012  . CKD (chronic kidney disease) stage 3, GFR 30-59 ml/min (HCC) 06/27/2012  . 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 Coronary artery disease   . GERD (gastroesophageal reflux disease)   . Hyperlipidemia LDL goal < 70 06/27/2012  . Hypertension   . Myocardial infarction ([place])   . Peptic ulcer   . STEMI (ST elevation myocardial infarction) ([place]) 06/27/2012  . Tobacco abuse 06/27/2012    Patient Active Problem List   Diagnosis Date Noted  . Essential hypertension 07/08/2015  . Hypotension 01/24/2014  . ACS (acute coronary syndrome), with ST elevation but stable coronary arteries on cath 06/27/12 06/27/2012  . CAD (coronary artery disease) of artery bypass graft 06/27/2012  . CAD (coronary artery disease), with CABG 1989 and re-do Cabg 2002 X 3 vesssels.  progressive disease with Stent to Lt. Main, and 1st diag 2006, stent to VG to LCX 2007, with known occlusion history o  06/27/2012  . 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 CKD (chronic kidney disease) stage 3, GFR 30-59 ml/min (HCC) 06/27/2012  . Hyperlipidemia with target LDL less than 70 06/27/2012  . Tobacco abuse 06/27/2012    Past Surgical History:  Procedure Laterality Date  . coratid arterty surgery    .  CORONARY ANGIOPLASTY WITH STENT PLACEMENT    . CORONARY ARTERY BYPASS GRAFT    . LCEA    . LEFT HEART CATH N/A 06/27/2012   Procedure: LEFT HEART CATH;  Surgeon: Troy Sine, MD;  Location: [facility] CATH LAB;  Service: Cardiovascular;  Laterality: N/A;  . triple bipass     Hx of CABG and RE do  . VASCULAR SURGERY          Home Medications    Prior to Admission medications   Medication Sig Start Date End Date Taking? Authorizing Provider  amLODipine (NORVASC) 5 MG tablet TAKE 1 TABLET BY MOUTH ONCE DAILY 08/29/17   Troy Sine, MD  amoxicillin-clavulanate (AUGMENTIN) 875-125 MG tablet Take 1 tablet by mouth 2 (two) times daily. One po bid x 7 days 03/24/18   Charlesetta Shanks,  MD  aspirin 81 MG chewable tablet Chew 81 mg by mouth daily.    [provider]  atorvastatin (LIPITOR) 80 MG tablet TAKE ONE TABLET BY MOUTH DAILY AT 6 P.M. NEEDS APPOINTMENT FOR FUTURE REFILLS. 01/15/17   Troy Sine, MD  azithromycin (ZITHROMAX Z-PAK) 250 MG tablet 1 tablet daily starting tomorrow for 4 days. 03/24/18   Charlesetta Shanks, MD  clopidogrel (PLAVIX) 75 MG tablet Take 1 tablet (75 mg total) by mouth daily. KEEP OV. 03/18/18   Troy Sine, MD  isosorbide mononitrate (IMDUR) 60 MG 24 hr tablet TAKE ONE-HALF TABLET BY MOUTH ONCE DAILY (PLEASE  MAKE  APPOINTMENT  FOR  FURTHER  REFILLS) 06/20/17   Troy Sine, MD  lisinopril (PRINIVIL,ZESTRIL) 10 MG tablet Take 1.5 tablets (15 mg total) by mouth daily. KEEP OV. 03/18/18   Troy Sine, MD  metoprolol tartrate (LOPRESSOR) 25 MG tablet TAKE ONE TABLET BY MOUTH TWICE DAILY (PLEASE  CONTACT  OFFICE  FOR  ADDITIONAL  REFILLS  ) 07/08/17   Troy Sine, MD  nitroGLYCERIN (NITROSTAT) 0.4 MG SL tablet Place 1 tablet (0.4 mg total) under the tongue every 5 (five) minutes as needed for chest pain. 08/29/17   Troy Sine, MD    Family History Family History  Problem Relation Age of Onset  . Stroke Mother   . Heart attack Father   . Stroke Maternal Aunt     Social History Social History   Tobacco Use  . Smoking status: Current Every Day Smoker    Packs/day: 1.50  . Smokeless tobacco: Never Used  Substance Use Topics  . Alcohol use: Yes    Alcohol/week: 6.0 oz    Types: 10 Cans of beer per week  . Drug use: No     Allergies   Patient has no known allergies.   Review of Systems Review of Systems 10 Systems reviewed and are negative for acute change except as noted in the HPI.   Physical Exam Updated Vital Signs BP 126/85 (BP Location: Right Arm)   Pulse 68   Temp 97.6 F (36.4 C) (Oral)   Resp 20   Ht 5\' 10"  (1.778 m)   Wt 73.3 kg (161 lb 9.6 oz)   SpO2 97%   BMI 23.19 kg/m   Physical Exam    Constitutional: He is oriented to person, place, and time. He appears well-developed and well-nourished. No distress.  HENT:  Head: Normocephalic and atraumatic.  Mouth/Throat: Oropharynx is clear and moist.  Eyes: Pupils are equal, round, and reactive to light. EOM are normal.  Neck: Neck supple.  Cardiovascular: Normal rate, regular rhythm, normal heart  sounds and intact distal pulses.  Pulmonary/Chest: Effort normal.  Patient does not have respiratory distress at rest.  He does have significantly decreased breath sounds on the right mid lung field to the base.  Abdominal: Soft. Bowel sounds are normal. He exhibits no distension. There is no tenderness.  Musculoskeletal: Normal range of motion. He exhibits no edema or tenderness.  Neurological: He is alert and oriented to person, place, and time. He has normal strength. No cranial nerve deficit. He exhibits normal muscle tone. Coordination normal. GCS eye subscore is 4. GCS verbal subscore is 5. GCS motor subscore is 6.  Skin: Skin is warm, dry and intact.  Psychiatric: He has a normal mood and affect.     ED Treatments / Results  Labs (all labs ordered are listed, but only abnormal results are displayed) Labs Reviewed  COMPREHENSIVE METABOLIC PANEL - Abnormal; Notable for the following components:      Result Value   Potassium 5.5 (*)    Chloride 114 (*)    CO2 20 (*)    Glucose, Bld 107 (*)    BUN 37 (*)    Creatinine, Ser 2.12 (*)    Albumin 3.1 (*)    ALT 13 (*)    GFR calc non Af Amer 29 (*)    GFR calc Af Amer 33 (*)    All other components within normal limits  BRAIN NATRIURETIC PEPTIDE - Abnormal; Notable for the following components:   B Natriuretic Peptide 147.3 (*)    All other components within normal limits  CBC WITH DIFFERENTIAL/PLATELET - Abnormal; Notable for the following components:   RBC 3.74 (*)    Hemoglobin 11.5 (*)    HCT 35.3 (*)    All other components within normal limits  URINALYSIS, ROUTINE W  REFLEX MICROSCOPIC - Abnormal; Notable for the following components:   Specific Gravity, Urine >1.030 (*)    Hgb urine dipstick SMALL (*)    Protein, ur 30 (*)    Leukocytes, UA TRACE (*)    All other components within normal limits  URINALYSIS, MICROSCOPIC (REFLEX) - Abnormal; Notable for the following components:   Bacteria, UA RARE (*)    All other components within normal limits  CULTURE, BLOOD (ROUTINE X 2)  CULTURE, BLOOD (ROUTINE X 2)  TROPONIN I  LIPASE, BLOOD  I-STAT CG4 LACTIC ACID, ED  I-STAT CG4 LACTIC ACID, ED    EKG EKG Interpretation  Date/Time:  Monday Mar 24 2018 15:26:46 EDT Ventricular Rate:  66 PR Interval:    QRS Duration: 110 QT Interval:  427 QTC Calculation: 448 R Axis:   66 Text Interpretation:  Sinus rhythm Inferior infarct, old no sig change from previous Confirmed by Charlesetta Shanks [phone]) on 03/24/2018 4:52:22 PM   Radiology Ct Chest Wo Contrast  Result Date: 03/24/2018 CLINICAL DATA:  Shortness of breath and weakness for 2 weeks. EXAM: CT CHEST WITHOUT CONTRAST TECHNIQUE: Multidetector CT imaging of the chest was performed following the standard protocol without IV contrast. COMPARISON:  Chest radiograph 06/27/2012 FINDINGS: Cardiovascular: Mildly enlarged heart. Mild pericardial thickening. Calcific atherosclerotic disease of the coronary arteries and aorta. Post CABG. Tortuosity of the thoracic aorta. Mediastinum/Nodes: Pretracheal and subcarinal lymphadenopathy. Esophagus and thyroid gland appear normal. Lungs/Pleura: There is a large right perihilar mass, with occlusion of the bronchus intermedius. Postobstructive atelectasis of the right middle and lower lobes prevent detailed evaluation and measurement. There is a loculated large in size pleural effusion. There is pre-existing upper lobe predominant paraseptal emphysema.  Subpleural scarring versus pulmonary nodule with peripheral calcifications seen in the right lung apex measuring 13.8 mm.  Interstitial edema versus lymphangitic spread of malignancy causes increased interstitial markings in the right upper lobe. Mild linear scarring versus atelectasis in the left lower lobe. Upper Abdomen: The right kidney is extremely atrophic or surgically absent. 2.5 cm hypoattenuated mass in the midpole region of the left kidney. Left nephrolithiasis. No evidence of hydronephrosis. Musculoskeletal: No suspicious osseous lesions. Anterior wedging deformity of L1 vertebral body with chronic appearance. IMPRESSION: Large right perihilar mass versus round pneumonia. The complete interruption of the bronchus intermedius favors malignancy. Postobstructive atelectasis of the right middle and right lower lobes. Interstitial thickening in the right upper lobe may represent lymphangitic spread of disease or an element of edema. Large loculated right pleural effusion. Right apical 14 mm secondary nodule versus scarring. Underlying upper lobe predominant paraseptal emphysema. 2.5 cm hypoattenuated mass in the midpole region of the left kidney, incompletely evaluated due to lack of IV contrast. Surgically absent or extremely atrophic right kidney. Aortic Atherosclerosis (ICD10-I70.0) and Emphysema (ICD10-J43.9). These results were called by telephone at the time of interpretation on 03/24/2018 at 4:44 pm to Dr. Charlesetta Shanks , who verbally acknowledged these results. Electronically Signed   By: Fidela Salisbury M.D.   On: 03/24/2018 16:47    Procedures Procedures (including critical care time) No critical care time Medications Ordered in ED Medications  sodium chloride 0.9 % bolus 1,000 mL (has no administration in time range)  AEROCHAMBER PLUS FLO-VU MEDIUM MISC 1 each (has no administration in time range)  ipratropium-albuterol (DUONEB) 0.5-2.5 (3) MG/3ML nebulizer solution 3 mL (3 mLs Nebulization Given 03/24/18 1559)  0.9 %  sodium chloride infusion ( Intravenous Stopped 03/24/18 1857)  cefTRIAXone (ROCEPHIN) 1  g in sodium chloride 0.9 % 100 mL IVPB (0 g Intravenous Stopped 03/24/18 1758)  azithromycin (ZITHROMAX) tablet 500 mg (500 mg Oral Given 03/24/18 1726)  albuterol (PROVENTIL HFA;VENTOLIN HFA) 108 (90 Base) MCG/ACT inhaler 2 puff (2 puffs Inhalation Given 03/24/18 1859)     Initial Impression / Assessment and Plan / ED Course  I have reviewed the triage vital signs and the nursing notes.  Pertinent labs & imaging results that were available during my care of the patient were reviewed by me and considered in my medical decision making (see chart for details).     Final Clinical Impressions(s) / ED Diagnoses   Final diagnoses:  AKI (acute kidney injury) ([place])  Pulmonary mass  Community acquired pneumonia of right lung, unspecified part of lung  Diarrhea, unspecified type  Shortness of breath  Findings on chest CT were concerning for neoplasm versus pneumonia.  Patient also has AKI.  Clinically, his appearance is good.  He is alert and nontoxic.  He does not have respiratory distress at rest and vital signs are stable.  I did feel best management for the patient would be admission for AKI and starting empiric treatment for pneumonia with further diagnostic evaluation for highly suspicious findings of lung malignancy.  Patient is aware of these findings.  He reports he cannot and will not be admitted to the hospital today.  He reports that he has to speak with his daughter first.  He reports that she will be too upset and worried if she finds out that he has been admitted to the hospital.  He reports that his wife has died and his daughter is very worried.  He is adamant that the best way to address this,  is for him to have a conversation with her and let her know what the findings are and then proceed with seeking treatment.  Reports he does have a family doctor and will be able to make arrangements for follow-up appointments.  Patient does have clear mental status and decision-making capacity.  His  vital signs are stable.  I will give antibiotics for presumed pneumonia with caveat that patient is aware there are some concerning findings on the CT suggestive of malignancy.  Patient is also counseled on dehydration and renal insufficiency.  He is advised that at any point time should he feels condition is worsening he should return for treatment.  ED Discharge Orders        Ordered    amoxicillin-clavulanate (AUGMENTIN) 875-125 MG tablet  2 times daily     03/24/18 1834    azithromycin (ZITHROMAX Z-PAK) 250 MG tablet     03/24/18 1834       Charlesetta Shanks, MD 03/24/18 Massie Bougie, MD 03/24/18 [phone]

## 2018-03-24 NOTE — ED Notes (Signed)
Re: order to consult hospitalist Dr. Johnney Killian states that pt is refusing to stay overnight and requests I hold off on consulting until she lets me know.

## 2018-03-24 NOTE — Discharge Instructions (Signed)
1.  You were advised to be admitted to the hospital for further treatment.  If you get worse in any way return immediately to the emergency department for treatment. 2.  You must call your family doctor tomorrow to schedule a follow-up appointment and get referral appointments to oncology and pulmonology.  You will need further evaluation and close monitoring to determine if you have pneumonia versus lung cancer.  Some of the findings on your CT scan were concerning for the potential of lung cancer.  Take all of your antibiotics as prescribed.

## 2018-03-25 ENCOUNTER — Other Ambulatory Visit: Payer: Self-pay | Admitting: *Deleted

## 2018-03-25 DIAGNOSIS — Z79899 Other long term (current) drug therapy: Secondary | ICD-10-CM

## 2018-03-25 DIAGNOSIS — N289 Disorder of kidney and ureter, unspecified: Secondary | ICD-10-CM

## 2018-03-29 LAB — CULTURE, BLOOD (ROUTINE X 2)
CULTURE: NO GROWTH
Culture: NO GROWTH
Special Requests: ADEQUATE
Special Requests: ADEQUATE

## 2018-03-31 ENCOUNTER — Inpatient Hospital Stay (HOSPITAL_COMMUNITY)
Admission: EM | Admit: 2018-03-31 | Discharge: 2018-04-09 | DRG: 181 | Disposition: A | Discharge status: Home Health | Payer: Medicare HMO | Attending: Oncology | Admitting: Oncology

## 2018-03-31 ENCOUNTER — Other Ambulatory Visit: Payer: Medicare HMO

## 2018-03-31 ENCOUNTER — Inpatient Hospital Stay (HOSPITAL_COMMUNITY): Payer: Medicare HMO

## 2018-03-31 ENCOUNTER — Encounter (HOSPITAL_COMMUNITY): Payer: Self-pay | Admitting: Neurology

## 2018-03-31 ENCOUNTER — Emergency Department (HOSPITAL_COMMUNITY): Payer: Medicare HMO

## 2018-03-31 ENCOUNTER — Other Ambulatory Visit: Payer: Self-pay

## 2018-03-31 ENCOUNTER — Encounter (HOSPITAL_COMMUNITY): Payer: Self-pay | Admitting: *Deleted

## 2018-03-31 ENCOUNTER — Ambulatory Visit (HOSPITAL_COMMUNITY): Payer: Medicare HMO

## 2018-03-31 DIAGNOSIS — Z7902 Long term (current) use of antithrombotics/antiplatelets: Secondary | ICD-10-CM | POA: Diagnosis not present

## 2018-03-31 DIAGNOSIS — R531 Weakness: Secondary | ICD-10-CM | POA: Diagnosis not present

## 2018-03-31 DIAGNOSIS — R29898 Other symptoms and signs involving the musculoskeletal system: Secondary | ICD-10-CM

## 2018-03-31 DIAGNOSIS — I10 Essential (primary) hypertension: Secondary | ICD-10-CM | POA: Diagnosis not present

## 2018-03-31 DIAGNOSIS — J189 Pneumonia, unspecified organism: Secondary | ICD-10-CM | POA: Diagnosis not present

## 2018-03-31 DIAGNOSIS — R918 Other nonspecific abnormal finding of lung field: Secondary | ICD-10-CM | POA: Diagnosis not present

## 2018-03-31 DIAGNOSIS — I252 Old myocardial infarction: Secondary | ICD-10-CM

## 2018-03-31 DIAGNOSIS — Z79899 Other long term (current) drug therapy: Secondary | ICD-10-CM | POA: Diagnosis not present

## 2018-03-31 DIAGNOSIS — F17210 Nicotine dependence, cigarettes, uncomplicated: Secondary | ICD-10-CM | POA: Diagnosis present

## 2018-03-31 DIAGNOSIS — Z955 Presence of coronary angioplasty implant and graft: Secondary | ICD-10-CM

## 2018-03-31 DIAGNOSIS — Q600 Renal agenesis, unilateral: Secondary | ICD-10-CM | POA: Diagnosis not present

## 2018-03-31 DIAGNOSIS — R846 Abnormal cytological findings in specimens from respiratory organs and thorax: Secondary | ICD-10-CM | POA: Diagnosis not present

## 2018-03-31 DIAGNOSIS — T476X5A Adverse effect of antidiarrheal drugs, initial encounter: Secondary | ICD-10-CM | POA: Diagnosis present

## 2018-03-31 DIAGNOSIS — N184 Chronic kidney disease, stage 4 (severe): Secondary | ICD-10-CM

## 2018-03-31 DIAGNOSIS — R0902 Hypoxemia: Secondary | ICD-10-CM | POA: Diagnosis not present

## 2018-03-31 DIAGNOSIS — R899 Unspecified abnormal finding in specimens from other organs, systems and tissues: Secondary | ICD-10-CM | POA: Diagnosis not present

## 2018-03-31 DIAGNOSIS — R634 Abnormal weight loss: Secondary | ICD-10-CM | POA: Diagnosis present

## 2018-03-31 DIAGNOSIS — R197 Diarrhea, unspecified: Secondary | ICD-10-CM | POA: Diagnosis not present

## 2018-03-31 DIAGNOSIS — J9690 Respiratory failure, unspecified, unspecified whether with hypoxia or hypercapnia: Secondary | ICD-10-CM

## 2018-03-31 DIAGNOSIS — K529 Noninfective gastroenteritis and colitis, unspecified: Secondary | ICD-10-CM | POA: Diagnosis present

## 2018-03-31 DIAGNOSIS — C3490 Malignant neoplasm of unspecified part of unspecified bronchus or lung: Secondary | ICD-10-CM

## 2018-03-31 DIAGNOSIS — Z9889 Other specified postprocedural states: Secondary | ICD-10-CM

## 2018-03-31 DIAGNOSIS — C342 Malignant neoplasm of middle lobe, bronchus or lung: Principal | ICD-10-CM | POA: Diagnosis present

## 2018-03-31 DIAGNOSIS — C384 Malignant neoplasm of pleura: Secondary | ICD-10-CM | POA: Diagnosis not present

## 2018-03-31 DIAGNOSIS — Z7982 Long term (current) use of aspirin: Secondary | ICD-10-CM | POA: Diagnosis not present

## 2018-03-31 DIAGNOSIS — J910 Malignant pleural effusion: Secondary | ICD-10-CM | POA: Diagnosis present

## 2018-03-31 DIAGNOSIS — F17200 Nicotine dependence, unspecified, uncomplicated: Secondary | ICD-10-CM | POA: Diagnosis not present

## 2018-03-31 DIAGNOSIS — R5381 Other malaise: Secondary | ICD-10-CM | POA: Diagnosis present

## 2018-03-31 DIAGNOSIS — N2889 Other specified disorders of kidney and ureter: Secondary | ICD-10-CM | POA: Diagnosis not present

## 2018-03-31 DIAGNOSIS — E875 Hyperkalemia: Secondary | ICD-10-CM | POA: Diagnosis not present

## 2018-03-31 DIAGNOSIS — I959 Hypotension, unspecified: Secondary | ICD-10-CM | POA: Diagnosis not present

## 2018-03-31 DIAGNOSIS — R112 Nausea with vomiting, unspecified: Secondary | ICD-10-CM | POA: Diagnosis not present

## 2018-03-31 DIAGNOSIS — Z951 Presence of aortocoronary bypass graft: Secondary | ICD-10-CM

## 2018-03-31 DIAGNOSIS — R0602 Shortness of breath: Secondary | ICD-10-CM

## 2018-03-31 DIAGNOSIS — R0609 Other forms of dyspnea: Secondary | ICD-10-CM | POA: Diagnosis not present

## 2018-03-31 DIAGNOSIS — Z6822 Body mass index (BMI) 22.0-22.9, adult: Secondary | ICD-10-CM

## 2018-03-31 DIAGNOSIS — E860 Dehydration: Secondary | ICD-10-CM | POA: Diagnosis present

## 2018-03-31 DIAGNOSIS — I129 Hypertensive chronic kidney disease with stage 1 through stage 4 chronic kidney disease, or unspecified chronic kidney disease: Secondary | ICD-10-CM | POA: Diagnosis present

## 2018-03-31 DIAGNOSIS — C801 Malignant (primary) neoplasm, unspecified: Secondary | ICD-10-CM | POA: Diagnosis not present

## 2018-03-31 DIAGNOSIS — C3491 Malignant neoplasm of unspecified part of right bronchus or lung: Secondary | ICD-10-CM | POA: Diagnosis not present

## 2018-03-31 DIAGNOSIS — I2510 Atherosclerotic heart disease of native coronary artery without angina pectoris: Secondary | ICD-10-CM | POA: Diagnosis present

## 2018-03-31 DIAGNOSIS — N179 Acute kidney failure, unspecified: Secondary | ICD-10-CM | POA: Diagnosis not present

## 2018-03-31 DIAGNOSIS — R0600 Dyspnea, unspecified: Secondary | ICD-10-CM | POA: Diagnosis not present

## 2018-03-31 DIAGNOSIS — C3401 Malignant neoplasm of right main bronchus: Secondary | ICD-10-CM | POA: Diagnosis not present

## 2018-03-31 DIAGNOSIS — J90 Pleural effusion, not elsewhere classified: Secondary | ICD-10-CM | POA: Diagnosis present

## 2018-03-31 DIAGNOSIS — J9811 Atelectasis: Secondary | ICD-10-CM | POA: Diagnosis not present

## 2018-03-31 DIAGNOSIS — Z8619 Personal history of other infectious and parasitic diseases: Secondary | ICD-10-CM | POA: Diagnosis not present

## 2018-03-31 DIAGNOSIS — Z978 Presence of other specified devices: Secondary | ICD-10-CM | POA: Diagnosis not present

## 2018-03-31 DIAGNOSIS — E785 Hyperlipidemia, unspecified: Secondary | ICD-10-CM | POA: Diagnosis present

## 2018-03-31 DIAGNOSIS — N183 Chronic kidney disease, stage 3 (moderate): Secondary | ICD-10-CM | POA: Diagnosis not present

## 2018-03-31 DIAGNOSIS — J181 Lobar pneumonia, unspecified organism: Secondary | ICD-10-CM | POA: Diagnosis not present

## 2018-03-31 LAB — COMPREHENSIVE METABOLIC PANEL
ALBUMIN: 3.1 g/dL — AB (ref 3.5–5.0)
ALK PHOS: 100 U/L (ref 38–126)
ALT: 18 U/L (ref 17–63)
AST: 20 U/L (ref 15–41)
Anion gap: 11 (ref 5–15)
BILIRUBIN TOTAL: 0.7 mg/dL (ref 0.3–1.2)
BUN: 32 mg/dL — ABNORMAL HIGH (ref 6–20)
CO2: 19 mmol/L — AB (ref 22–32)
CREATININE: 2.53 mg/dL — AB (ref 0.61–1.24)
Calcium: 9.3 mg/dL (ref 8.9–10.3)
Chloride: 107 mmol/L (ref 101–111)
GFR calc non Af Amer: 23 mL/min — ABNORMAL LOW (ref >60)
GFR, EST AFRICAN AMERICAN: 27 mL/min — AB (ref >60)
GLUCOSE: 99 mg/dL (ref 65–99)
Potassium: 4.6 mmol/L (ref 3.5–5.1)
SODIUM: 137 mmol/L (ref 135–145)
TOTAL PROTEIN: 7 g/dL (ref 6.5–8.1)

## 2018-03-31 LAB — CBC WITH DIFFERENTIAL/PLATELET
Abs Immature Granulocytes: 0 10*3/uL (ref 0.0–0.1)
BASOS ABS: 0.1 10*3/uL (ref 0.0–0.1)
BASOS PCT: 1 %
EOS ABS: 0 10*3/uL (ref 0.0–0.7)
Eosinophils Relative: 1 %
HCT: 38 % — ABNORMAL LOW (ref 39.0–52.0)
Hemoglobin: 11.8 g/dL — ABNORMAL LOW (ref 13.0–17.0)
Immature Granulocytes: 0 %
Lymphocytes Relative: 6 %
Lymphs Abs: 0.4 10*3/uL — ABNORMAL LOW (ref 0.7–4.0)
MCH: 29 pg (ref 26.0–34.0)
MCHC: 31.1 g/dL (ref 30.0–36.0)
MCV: 93.4 fL (ref 78.0–100.0)
MONO ABS: 0.4 10*3/uL (ref 0.1–1.0)
MONOS PCT: 6 %
Neutro Abs: 5.8 10*3/uL (ref 1.7–7.7)
Neutrophils Relative %: 86 %
Platelets: 285 10*3/uL (ref 150–400)
RBC: 4.07 MIL/uL — ABNORMAL LOW (ref 4.22–5.81)
RDW: 13.7 % (ref 11.5–15.5)
WBC: 6.7 10*3/uL (ref 4.0–10.5)

## 2018-03-31 LAB — BODY FLUID CELL COUNT WITH DIFFERENTIAL
Eos, Fluid: 4 %
Lymphs, Fluid: 46 %
Monocyte-Macrophage-Serous Fluid: 45 % — ABNORMAL LOW (ref 50–90)
NEUTROPHIL FLUID: 5 % (ref 0–25)
Total Nucleated Cell Count, Fluid: 461 cu mm (ref 0–1000)

## 2018-03-31 LAB — LACTATE DEHYDROGENASE, PLEURAL OR PERITONEAL FLUID: LD, Fluid: 558 U/L — ABNORMAL HIGH (ref 3–23)

## 2018-03-31 LAB — BRAIN NATRIURETIC PEPTIDE: B Natriuretic Peptide: 104.4 pg/mL — ABNORMAL HIGH (ref 0.0–100.0)

## 2018-03-31 LAB — PROTEIN, PLEURAL OR PERITONEAL FLUID: Total protein, fluid: 3.7 g/dL

## 2018-03-31 LAB — TROPONIN I

## 2018-03-31 LAB — PROTIME-INR
INR: 1.03
Prothrombin Time: 13.4 seconds (ref 11.4–15.2)

## 2018-03-31 MED ORDER — ACETAMINOPHEN 650 MG RE SUPP: 650.0000 mg | Freq: Four times a day (QID) | RECTAL | Status: DC | PRN

## 2018-03-31 MED ORDER — ATORVASTATIN CALCIUM 80 MG PO TABS
80.0000 mg | ORAL_TABLET | Freq: Every day | ORAL | Status: DC
  Administered 2018-03-31 – 2018-04-09 (×10): 80 mg via ORAL
  Filled 2018-03-31 (×10): qty 1

## 2018-03-31 MED ORDER — CLOPIDOGREL BISULFATE 75 MG PO TABS: 75.0000 mg | ORAL_TABLET | Freq: Every day | ORAL | Status: DC

## 2018-03-31 MED ORDER — SENNOSIDES-DOCUSATE SODIUM 8.6-50 MG PO TABS: 1.0000 | ORAL_TABLET | Freq: Every evening | ORAL | Status: DC | PRN

## 2018-03-31 MED ORDER — SODIUM CHLORIDE 0.9 % IV BOLUS
500.0000 mL | Freq: Once | INTRAVENOUS | Status: AC
  Administered 2018-03-31: 500 mL via INTRAVENOUS

## 2018-03-31 MED ORDER — HEPARIN SODIUM (PORCINE) 5000 UNIT/ML IJ SOLN
5000.0000 [IU] | Freq: Three times a day (TID) | INTRAMUSCULAR | Status: DC
  Administered 2018-04-01 – 2018-04-09 (×23): 5000 [IU] via SUBCUTANEOUS
  Filled 2018-03-31 (×24): qty 1

## 2018-03-31 MED ORDER — AMLODIPINE BESYLATE 5 MG PO TABS: 5.0000 mg | ORAL_TABLET | Freq: Every day | ORAL | Status: DC

## 2018-03-31 MED ORDER — AMLODIPINE BESYLATE 5 MG PO TABS
5.0000 mg | ORAL_TABLET | Freq: Every day | ORAL | Status: DC
  Administered 2018-03-31 – 2018-04-02 (×3): 5 mg via ORAL
  Filled 2018-03-31 (×3): qty 1

## 2018-03-31 MED ORDER — ACETAMINOPHEN 325 MG PO TABS
650.0000 mg | ORAL_TABLET | Freq: Four times a day (QID) | ORAL | Status: DC | PRN
  Administered 2018-04-02 – 2018-04-03 (×4): 650 mg via ORAL
  Filled 2018-03-31 (×4): qty 2

## 2018-03-31 MED ORDER — ASPIRIN 81 MG PO CHEW: 81.0000 mg | CHEWABLE_TABLET | Freq: Every day | ORAL | Status: DC

## 2018-03-31 MED ORDER — METOPROLOL TARTRATE 25 MG PO TABS: 25.0000 mg | ORAL_TABLET | Freq: Two times a day (BID) | ORAL | Status: DC

## 2018-03-31 MED ORDER — CLOPIDOGREL BISULFATE 75 MG PO TABS
75.0000 mg | ORAL_TABLET | Freq: Every day | ORAL | Status: DC
  Administered 2018-04-01 – 2018-04-04 (×4): 75 mg via ORAL
  Filled 2018-03-31 (×4): qty 1

## 2018-03-31 MED ORDER — ASPIRIN 81 MG PO CHEW
81.0000 mg | CHEWABLE_TABLET | Freq: Every day | ORAL | Status: DC
  Administered 2018-04-01 – 2018-04-09 (×7): 81 mg via ORAL
  Filled 2018-03-31 (×7): qty 1

## 2018-03-31 NOTE — ED Notes (Signed)
Pt requesting for RN to call and cancel his echo appointment today at 1130. This was done, message left.

## 2018-03-31 NOTE — H&P (Addendum)
Date: 03/31/2018               Patient Name:  Christopher Wilson MRN: [id number]  DOB: 11-08-1940 Age / Sex: 77 y.o., male   PCP: System, Pcp Not In         Medical Service: Internal Medicine Teaching Service         Attending Physician: Dr. Tacey Heap, MD    First Contact: Dr. Hetty Ely Pager: [phone]  Second Contact: Dr. 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  Patient also reports a 3-4 week history of fatigue and weakness with chronic diarrhea (2 episodes per day) and reports a 22 lbs unintentional weight loss in the past 3-4 months. He reports seeing his cardiologist at the end of May and was scheduled to have an echocardiogram and a CT chest however due to worsening symptoms he went to [facility] ED on 5/27 and had a CT chest and was started on antibiotics. He declined admission at that time.  He states he completed 5 days of Augmentin with little benefit to symptoms. He decided to go to [facility] ED since symptoms were not improving.  Patient also reports a non productive cough.  Patient denies fever/chills, hemoptysis, Vomiting, abdominal pain, or lower extremity swelling.   Meds:  Current Meds  Medication Sig  . albuterol (PROVENTIL HFA;VENTOLIN HFA) 108 (90 Base) MCG/ACT inhaler Inhale 2 puffs into the lungs every 6 (six) hours as needed for wheezing or shortness of breath.  Marland Kitchen amLODipine (NORVASC) 5 MG tablet TAKE 1 TABLET BY MOUTH ONCE DAILY  . amoxicillin-clavulanate (AUGMENTIN) 875-125 MG tablet Take 1  tablet by mouth 2 (two) times daily. One po bid x 7 days  . aspirin 81 MG chewable tablet Chew 81 mg by mouth daily.  Marland Kitchen atorvastatin (LIPITOR) 80 MG tablet TAKE ONE TABLET BY MOUTH DAILY AT 6 P.M. NEEDS APPOINTMENT FOR FUTURE REFILLS.  Marland Kitchen clopidogrel (PLAVIX) 75 MG tablet Take 1 tablet (75 mg total) by mouth daily. KEEP OV.  . isosorbide mononitrate (IMDUR) 60 MG 24 hr tablet TAKE ONE-HALF TABLET BY MOUTH ONCE DAILY (PLEASE  MAKE  APPOINTMENT  FOR  FURTHER  REFILLS)  . metoprolol tartrate (LOPRESSOR) 25 MG tablet TAKE ONE TABLET BY MOUTH TWICE DAILY (PLEASE  CONTACT  OFFICE  FOR  ADDITIONAL  REFILLS  )  . nitroGLYCERIN (NITROSTAT) 0.4 MG SL tablet Place 1 tablet (0.4 mg total) under the tongue every 5 (five) minutes as needed for chest pain.  . Probiotic Product (PROBIOTIC-10 PO) Take 1 tablet by mouth daily.     Allergies: Allergies as of 03/31/2018  . (No Known Allergies)   Past Medical History:  Diagnosis Date  . ACS (acute coronary syndrome), with ST elevation but stable coronary arteries on cath 06/27/12 06/27/2012  . CAD (coronary artery disease), with CABG 1989 and re-do Cabg 2002 X 3 vesssels.  progressive disease with Stent to Lt. Main, and 1st diag 2006, stent to VG to LCX 2007, with known occlusion history  o  06/27/2012  . CKD (chronic kidney disease) stage 3, GFR 30-59 ml/min (HCC) 06/27/2012  . 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 Coronary artery disease   . GERD (gastroesophageal reflux disease)   . Hyperlipidemia LDL goal < 70 06/27/2012  . Hypertension   . Myocardial infarction ([place])   . Peptic ulcer   . STEMI (ST elevation myocardial infarction) ([place]) 06/27/2012  . Tobacco abuse 06/27/2012    Family History:  Family History  Problem Relation Age of Onset  . Stroke Mother   . Heart attack Father   . Stroke Maternal Aunt     Social History: Occupation: Truck driver Tobacco use: 1.5 pack per week cigarette smoker.  75 pack year smoking history Etoh use: states he buys  a 12 pack of beer that lasted for a week and a half. States his last drink was 3 weeks ago. Illicit drug use:  denies  Review of Systems: A complete ROS was negative except as per HPI.   Physical Exam: Blood pressure (!) 158/90, pulse 88, temperature 97.7 F (36.5 C), temperature source Oral, resp. rate 17, height 5\' 10"  (1.778 m), weight 160 lb (72.6 kg), SpO2 99 %. Physical Exam  Constitutional: No distress.  Cardiovascular: Normal rate, regular rhythm and normal heart sounds. Exam reveals no gallop and no friction rub.  No murmur heard. Pulmonary/Chest: Effort normal. He has no wheezes. He has no rales.  No breath sounds on the right lower lung field  Abdominal: Soft. He exhibits no distension. There is no tenderness.  Musculoskeletal: He exhibits no edema.  Skin: Skin is warm and dry. No erythema.    EKG: personally reviewed my interpretation is sinus tachycardia  CXR: personally reviewed my interpretation is right sided pleural effusion  Assessment & Plan by Problem: Active Problems:   Pleural effusion on right  Large loculated right pleural effusion Large right perihilar mass A CT chest on 5/27 in the ED showed a large right perihilar mass versus round pneumonia and large loculated right pleural effusion.  He declined admission at that time.  Patient was started on augmentin for 7 days and azithromycin for 4 days.  Patient completed 5 days of antibiotics.  Patient returned to the ED today because symptoms were not improving with antibiotics.  Patient had a chest x-ray that showed unchanged extensive opacification of the right hemithorax. Pleural effusion showed signs of loculation.  High suspicion for malignancy.  PCCM was consultated in the ED for thoracentesis and procedures planned for today.  Currently patient is afebrile without a leukocytosis. No plans for antibiotics at this time.  Will await further recommendations per PCCM. - appreciate PCCM recommendations -  thoracentesis   AKI Patient has a history of CKD stage III with single congenital kidney. Patient's creatinine today was 2.5. Unclear baseline however on 5/27 creatinine was 2.12. Patient reports 2 day history of poor oral intake and diarrhea.  AKI likely prerenal.  Patient is receiving a 500 mL bolus.  Will follow with [facility] and give additional fluids as needed. - 500cc bolus IV NS - Bmet in the am  CAD s/p CABG 1989 and redo 2002 Left carotid endarterectomy 2014 On aspirin/plavix and atorvastatin -continue atorvastatin - continue aspirin/plavix tomorrow  HTN On amlodipine 5mg  daily, IMDUR 60mg  daily and metoprolol tartrate 25mg  daily.  Hasn't taken his medications since 5/31 due to malaise.   Blood pressure elevated at 144/81.  Will restart amlodipine. - amlodpine 5mg       Dispo: Admit patient to  Inpatient with expected length of stay greater than 2 midnights.  Signed: Valinda Party, DO 03/31/2018, 12:46 PM  Pager: [phone]

## 2018-03-31 NOTE — Progress Notes (Unsigned)
Received Message from Nursing Staff at [facility] (9:03 AM, Judson Roch).  Patient is in the emergency room and will not be present for echocardiogram at 11:30 AM.    Deliah Boston, RDCS

## 2018-03-31 NOTE — ED Notes (Signed)
Report given to RN, 2W.

## 2018-03-31 NOTE — ED Triage Notes (Signed)
Per ems- pt is coming home c/o sob, was dx with PNA last week, has been taking azithromycin and amoxicillin. Just not feeling better, and he feels weak. Was told recently he might have lung cancer, he didn't want to be admitted last time, but is more open to it this time. Denies any pain. Is a x 4. BP 122/80, HR 100, 95% RA. Overall this has been going on for 3 weeks. CBG 99. Weight loss of 25 lbs over past 6 months.

## 2018-03-31 NOTE — Consult Note (Addendum)
Name: Christopher Wilson MRN: [id number] DOB: [birth year]    ADMISSION DATE:  03/31/2018 CONSULTATION DATE:  6/3  REFERRING MD :  EDP  CHIEF COMPLAINT:  Lung mass, pleural effusion   BRIEF PATIENT DESCRIPTION: 77yo male smoker with extensive cardiac hx including CAD with CABG x2 ('89 and redo 2002), multiple stents, CKD, hx HTN presented 6/3 with several weeks hx worsening fatigue, dyspnea, weakness, weight loss.  Was seen 5/27 at [facility] with large lung mass noted as well as AKI.  He declined admission at that time and returned to [facility] 6/3 with same s/s.   SIGNIFICANT EVENTS    STUDIES:  CT chest 5/27>>> Large right perihilar mass versus round pneumonia. The complete interruption of the bronchus intermedius favors malignancy. Postobstructive atelectasis of the right middle and right lower lobes.   Interstitial thickening in the right upper lobe may represent lymphangitic spread of disease or an element of edema.  Large loculated right pleural effusion.  Right apical 14 mm secondary nodule versus scarring.  Underlying upper lobe predominant paraseptal emphysema.   HISTORY OF PRESENT ILLNESS:   77yo male smoker with extensive cardiac hx including CAD with CABG x2 ('89 and redo 2002), multiple stents, CKD, hx HTN presented 6/3 with several weeks hx worsening fatigue, dyspnea, weakness, weight loss.  Was seen 5/27 at [facility] with large lung mass noted as well as AKI.  He declined admission at that time and returned to [facility] 6/3 with same s/s.   States he has been progressively weak for months.   28lbs weight loss since February.  75 pack year smoking hx, still actively smoking.  Denies chest pain, hemoptysis, purulent sputum, fevers, syncope.    PAST MEDICAL HISTORY :   has a past medical history of ACS (acute coronary syndrome), with ST elevation but stable coronary arteries on cath 06/27/12 (06/27/2012), CAD (coronary artery disease), with CABG 1989 and re-do Cabg 2002 X 3  vesssels.  progressive disease with Stent to Lt. 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  has a past surgical history that includes Coronary angioplasty with stent; triple bipass; coratid arterty surgery; Coronary artery bypass graft; Vascular surgery; LCEA; and left heart cath (N/A, 06/27/2012). Prior to Admission medications   Medication Sig Start Date End Date Taking? Authorizing Provider  albuterol (PROVENTIL HFA;VENTOLIN HFA) 108 (90 Base) MCG/ACT inhaler Inhale 2 puffs into the lungs every 6 (six) hours as needed for wheezing or shortness of breath.   Yes [provider]  amLODipine (NORVASC) 5 MG tablet TAKE 1 TABLET BY MOUTH ONCE DAILY 08/29/17  Yes Troy Sine, MD  amoxicillin-clavulanate (AUGMENTIN) 875-125 MG tablet Take 1 tablet by mouth 2 (two) times daily. One po bid x 7 days 03/24/18  Yes Pfeiffer, Jeannie Done, MD  aspirin 81 MG chewable tablet Chew 81 mg by mouth daily.   Yes [provider]  atorvastatin (LIPITOR) 80 MG tablet TAKE ONE TABLET BY MOUTH DAILY AT 6 P.M. NEEDS APPOINTMENT FOR FUTURE REFILLS. 01/15/17  Yes Troy Sine, MD  clopidogrel (PLAVIX) 75 MG tablet Take 1 tablet (75 mg total) by mouth daily. KEEP OV. 03/18/18  Yes Shelva Majestic  A, MD  isosorbide mononitrate (IMDUR) 60 MG 24 hr tablet TAKE ONE-HALF TABLET BY MOUTH ONCE DAILY (PLEASE  MAKE  APPOINTMENT  FOR  FURTHER  REFILLS) 06/20/17  Yes Troy Sine, MD  metoprolol tartrate (LOPRESSOR) 25 MG tablet TAKE ONE TABLET BY MOUTH TWICE DAILY (PLEASE  CONTACT  OFFICE  FOR  ADDITIONAL  REFILLS   ) 07/08/17  Yes Troy Sine, MD  nitroGLYCERIN (NITROSTAT) 0.4 MG SL tablet Place 1 tablet (0.4 mg total) under the tongue every 5 (five) minutes as needed for chest pain. 08/29/17  Yes Troy Sine, MD  Probiotic Product (PROBIOTIC-10 PO) Take 1 tablet by mouth daily.   Yes [provider]  azithromycin (ZITHROMAX Z-PAK) 250 MG tablet 1 tablet daily starting tomorrow for 4 days. 03/24/18   Charlesetta Shanks, MD   No Known Allergies  FAMILY HISTORY:  family history includes Heart attack in his father; Stroke in his maternal aunt and mother. SOCIAL HISTORY:  reports that he has been smoking.  He has been smoking about 1.50 packs per day. He has never used smokeless tobacco. He reports that he drinks about 6.0 oz of alcohol per week. He reports that he does not use drugs.  REVIEW OF SYSTEMS:   As per HPI - All other systems reviewed and were neg.    SUBJECTIVE:   VITAL SIGNS: Temp:  [97.7 F (36.5 C)] 97.7 F (36.5 C) (06/03 0819) Pulse Rate:  [92-96] 92 (06/03 1000) Resp:  [15-21] 21 (06/03 1000) BP: (122-133)/(78-92) 133/82 (06/03 1000) SpO2:  [96 %-97 %] 97 % (06/03 1000) Weight:  [72.6 kg (160 lb)] 72.6 kg (160 lb) (06/03 0821)  PHYSICAL EXAMINATION: General:  Pleasant, chronically ill appearing male, NAD  Neuro:  Awake, alert, appropriate, MAE  HEENT:  Mm moist, no JVD, no palpable LAN Cardiovascular:  s1s2 rrr Lungs:  resps even non labored on [place], diminished R  Abdomen:  Soft, non tender, +bs  Musculoskeletal:  Warm and dry, no edema   Recent Labs  Lab 03/24/18 1558 03/31/18 0830  NA 142 137  K 5.5* 4.6  CL 114* 107  CO2 20* 19*  BUN 37* 32*  CREATININE 2.12* 2.53*  GLUCOSE 107* 99   Recent Labs  Lab 03/24/18 1558 03/31/18 0830  HGB 11.5* 11.8*  HCT 35.3* 38.0*  WBC 5.7 6.7  PLT 277 285   Dg Chest 2 View  Result Date: 03/31/2018 CLINICAL DATA:  Shortness of breath. EXAM: CHEST - 2 VIEW COMPARISON:  03/24/2018 chest CT FINDINGS: Extensive  opacification of the right chest with leftward mediastinal shift. This is a combination of lung mass and pleural effusion that show signs of loculation. Normal heart size. Status post CABG and stenting. 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 These results were called by telephone at the time of interpretation on 03/31/2018 at 8:48 am to [place] who verbally acknowledged these results. IMPRESSION: Unchanged extensive opacification of the right hemithorax, a combination of lung mass and pleural effusion by CT. The pleural effusion show signs of loculation and is likely malignant. Electronically Signed   By: Monte Fantasia M.D.   On: 03/31/2018 08:58    ASSESSMENT / PLAN:  Lung mass - highly suspicious for malignancy Pleural effusion   PLAN -  Medicine to admit  Needs diagnostic and therapeutic thoracentesis  Await pleural fluid cytology - if inconclusive will need FOB for tissue dx  PRN BD Smoking cessation   AKI on CKD - likely r/t  dehydration in setting poor PO intake  PLAN -  Will start gentle IVF  Medicine to address further    Nickolas Madrid, NP 03/31/2018  11:16 AM Pager: [phone] or [phone]  Attending Note:  77 year old male smoker with an extensive cardiac history who presents to the hospital with wt loss, SOB, DOE and was found to have a pleural effusion.  On exam, decreased BS on the right, barely moving air.  I reviewed chest CT myself, pleural effusion noted.  Discussed with PCCM-NP and patient and EDP.    Pleural effusion:  - [name] now  - INR now, thora if normal, patient is on plavix  DOE: due to large lung mass and pleural effusion  - Thora  Hypoxemia:  - Titrate O2 for sat of 88-92%  Lung mass:  - Thora, if negative then repeat thora if fluid is present, if not then will need a bronch  Wt loss: due to oncologic process most likely  - Nutritionist to see patient  - May need to consider megace..etc.  AKI:  - Gentle IVF hydration,  patient has not eaten or drank anything in 2 days.  PCCM will follow.  Patient seen and examined, agree with above note.  I dictated the care and orders written for this patient under my direction.  Rush Farmer, [place]

## 2018-03-31 NOTE — Procedures (Signed)
Thoracentesis Procedure Note  Pre-operative Diagnosis: Right sided pleural effusion  Post-operative Diagnosis: same  Indications: Right sided pleural effusion and lung mass  Procedure Details  Consent: Informed consent was obtained. Risks of the procedure were discussed including: infection, bleeding, pain, pneumothorax.  Under sterile conditions the patient was positioned. Betadine solution and sterile drapes were utilized.  1% plain lidocaine was used to anesthetize the 6 rib space. Fluid was obtained without any difficulties and minimal blood loss.  A dressing was applied to the wound and wound care instructions were provided.   Findings 1400 ml of bloody pleural fluid was obtained. A sample was sent to Pathology for cytogenetics, flow, and cell counts, as well as for infection analysis.  Complications:  None; patient tolerated the procedure well.          Condition: stable  Plan A follow up chest x-ray was ordered. Bed Rest for 1 hours. Tylenol 650 mg. for pain.  Attending Attestation: I performed the procedure.  U/S used in the procedure.  Rush Farmer, M.D. [facility] Pulmonary/Critical Care Medicine. Pager: [phone]. After hours pager: [phone].

## 2018-03-31 NOTE — ED Provider Notes (Signed)
[facility] EMERGENCY DEPARTMENT Provider Note   CSN: [id number] Arrival date & time: 03/31/18  4270     History   Chief Complaint Chief Complaint  Patient presents with  . Shortness of Breath    HPI Christopher Wilson is a 77 y.o. male.  HPI Patient presents 3 days after being evaluated here, now with concern for worsening dyspnea, fatigue. Notably, the patient was started on antibiotics for possible pneumonia, notes that he has been taking his medications as directed, but has progression of his symptoms, as well as worsening fatigue. No substantial new cough. Patient is aware of CT results from a few days ago with concern for possible malignancy as well as pneumonia. He denies new fever, vomiting, diarrhea, syncope. Past Medical History:  Diagnosis Date  . ACS (acute coronary syndrome), with ST elevation but stable coronary arteries on cath 06/27/12 06/27/2012  . CAD (coronary artery disease), with CABG 1989 and re-do Cabg 2002 X 3 vesssels.  progressive disease with Stent to Lt. Main, and 1st diag 2006, stent to VG to LCX 2007, with known occlusion history o  06/27/2012  . CKD (chronic kidney disease) stage 3, GFR 30-59 ml/min ([facility]) 06/27/2012  . 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 Coronary artery disease   . GERD (gastroesophageal reflux disease)   . Hyperlipidemia LDL goal < 70 06/27/2012  . Hypertension   . Myocardial infarction ([place])   . Peptic ulcer   . STEMI (ST elevation myocardial infarction) ([place]) 06/27/2012  . Tobacco abuse 06/27/2012    Patient Active Problem List   Diagnosis Date Noted  . Essential hypertension 07/08/2015  . Hypotension 01/24/2014  . ACS (acute coronary syndrome), with ST elevation but stable coronary arteries on cath 06/27/12 06/27/2012  . CAD (coronary artery disease) of artery bypass graft 06/27/2012  . CAD (coronary artery disease), with CABG 1989 and re-do Cabg 2002 X 3 vesssels.  progressive disease with Stent to Lt. Main,  and 1st diag 2006, stent to VG to LCX 2007, with known occlusion history o  06/27/2012  . 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 CKD (chronic kidney disease) stage 3, GFR 30-59 ml/min ([facility]) 06/27/2012  . Hyperlipidemia with target LDL less than 70 06/27/2012  . Tobacco abuse 06/27/2012    Past Surgical History:  Procedure Laterality Date  . coratid arterty surgery    . CORONARY ANGIOPLASTY WITH STENT PLACEMENT    . CORONARY ARTERY BYPASS GRAFT    . LCEA    . LEFT HEART CATH N/A 06/27/2012   Procedure: LEFT HEART CATH;  Surgeon: Troy Sine, MD;  Location: [facility] CATH LAB;  Service: Cardiovascular;  Laterality: N/A;  . triple bipass     Hx of CABG and RE do  . VASCULAR SURGERY          Home Medications    Prior to Admission medications   Medication Sig Start Date End Date Taking? Authorizing Provider  amLODipine (NORVASC) 5 MG tablet TAKE 1 TABLET BY MOUTH ONCE DAILY 08/29/17   Troy Sine, MD  amoxicillin-clavulanate (AUGMENTIN) 875-125 MG tablet Take 1 tablet by mouth 2 (two) times daily. One po bid x 7 days 03/24/18   Charlesetta Shanks, MD  aspirin 81 MG chewable tablet Chew 81 mg by mouth daily.    [provider]  atorvastatin (LIPITOR) 80 MG tablet TAKE ONE TABLET BY MOUTH DAILY AT 6 P.M. NEEDS APPOINTMENT FOR FUTURE REFILLS. 01/15/17   Troy Sine, MD  azithromycin (ZITHROMAX Z-PAK)  250 MG tablet 1 tablet daily starting tomorrow for 4 days. 03/24/18   Charlesetta Shanks, MD  clopidogrel (PLAVIX) 75 MG tablet Take 1 tablet (75 mg total) by mouth daily. KEEP OV. 03/18/18   Troy Sine, MD  isosorbide mononitrate (IMDUR) 60 MG 24 hr tablet TAKE ONE-HALF TABLET BY MOUTH ONCE DAILY (PLEASE  MAKE  APPOINTMENT  FOR  FURTHER  REFILLS) 06/20/17   Troy Sine, MD  metoprolol tartrate (LOPRESSOR) 25 MG tablet TAKE ONE TABLET BY MOUTH TWICE DAILY (PLEASE  CONTACT  OFFICE  FOR  ADDITIONAL  REFILLS  ) 07/08/17   Troy Sine, MD  nitroGLYCERIN (NITROSTAT) 0.4 MG SL tablet  Place 1 tablet (0.4 mg total) under the tongue every 5 (five) minutes as needed for chest pain. 08/29/17   Troy Sine, MD    Family History Family History  Problem Relation Age of Onset  . Stroke Mother   . Heart attack Father   . Stroke Maternal Aunt     Social History Social History   Tobacco Use  . Smoking status: Current Every Day Smoker    Packs/day: 1.50  . Smokeless tobacco: Never Used  Substance Use Topics  . Alcohol use: Yes    Alcohol/week: 6.0 oz    Types: 10 Cans of beer per week  . Drug use: No     Allergies   Patient has no known allergies.   Review of Systems Review of Systems  Constitutional:       Per HPI, otherwise negative  HENT:       Per HPI, otherwise negative  Respiratory:       Per HPI, otherwise negative  Cardiovascular:       Per HPI, otherwise negative  Gastrointestinal: Negative for vomiting.  Endocrine:       Negative aside from HPI  Genitourinary:       Neg aside from HPI   Musculoskeletal:       Per HPI, otherwise negative  Skin: Negative.   Neurological: Positive for weakness. Negative for syncope.     Physical Exam Updated Vital Signs There were no vitals taken for this visit.  Physical Exam  Constitutional: He is oriented to person, place, and time. No distress.  HENT:  Head: Normocephalic and atraumatic.  Eyes: Conjunctivae and EOM are normal.  Cardiovascular: Normal rate and regular rhythm.  Pulmonary/Chest: He has decreased breath sounds in the right upper field, the right middle field and the right lower field.  Abdominal: He exhibits no distension.  Musculoskeletal: He exhibits no edema.  Neurological: He is alert and oriented to person, place, and time.  Skin: Skin is warm and dry.  Psychiatric: He has a normal mood and affect.  Nursing note and vitals reviewed.    ED Treatments / Results  Labs (all labs ordered are listed, but only abnormal results are displayed) Labs Reviewed  COMPREHENSIVE  METABOLIC PANEL  CBC WITH DIFFERENTIAL/PLATELET  TROPONIN I  BRAIN NATRIURETIC PEPTIDE    EKG None  Radiology No results found.  Procedures Procedures (including critical care time)  Medications Ordered in ED Medications - No data to display   Initial Impression / Assessment and Plan / ED Course  I have reviewed the triage vital signs and the nursing notes.  Pertinent labs & imaging results that were available during my care of the patient were reviewed by me and considered in my medical decision making (see chart for details).    Chart review notable for  evaluation 4 days ago, with initiation of medication for pneumonia, thorough eval including CT results as below IMPRESSION: Large right perihilar mass versus round pneumonia. The complete interruption of the bronchus intermedius favors malignancy. Postobstructive atelectasis of the right middle and right lower lobes.   Interstitial thickening in the right upper lobe may represent lymphangitic spread of disease or an element of edema.   Large loculated right pleural effusion.   Right apical 14 mm secondary nodule versus scarring.   Underlying upper lobe predominant paraseptal emphysema.   2.5 cm hypoattenuated mass in the midpole region of the left kidney, incompletely evaluated due to lack of IV contrast.   Surgically absent or extremely atrophic right kidney.   Aortic Atherosclerosis (ICD10-I70.0) and Emphysema (ICD10-J43.9).   These results were called by telephone at the time of interpretation on 03/24/2018 at 4:44 pm to Dr. Charlesetta Shanks , who verbally acknowledged these results.     Electronically Signed   By: Fidela Salisbury M.D.   On: 03/24/2018 16:47  9:46 AM Patient aware of all findings, and today's evaluation, concerning for persistent/worsening right-sided pleural effusion. Initial labs generally reassuring, EKG generally reassuring, there is suspicion for persistent opacification possibly  secondary to postobstructive condition. Given the patient's worsening symptoms, persistent effusion, concern for malignancy and need for thoracentesis, I discussed this case with pulmonology team, the patient was admitted for further evaluation and management.   Final Clinical Impressions(s) / ED Diagnoses  Pleural effusion Dyspnea   Carmin Muskrat, MD 03/31/18 1622

## 2018-03-31 NOTE — ED Notes (Signed)
Consent obtained for thoracentesis by Dr. Nelda Marseille.

## 2018-04-01 DIAGNOSIS — F17210 Nicotine dependence, cigarettes, uncomplicated: Secondary | ICD-10-CM

## 2018-04-01 DIAGNOSIS — R0609 Other forms of dyspnea: Secondary | ICD-10-CM

## 2018-04-01 DIAGNOSIS — K529 Noninfective gastroenteritis and colitis, unspecified: Secondary | ICD-10-CM

## 2018-04-01 DIAGNOSIS — R918 Other nonspecific abnormal finding of lung field: Secondary | ICD-10-CM

## 2018-04-01 DIAGNOSIS — Z7902 Long term (current) use of antithrombotics/antiplatelets: Secondary | ICD-10-CM

## 2018-04-01 DIAGNOSIS — I129 Hypertensive chronic kidney disease with stage 1 through stage 4 chronic kidney disease, or unspecified chronic kidney disease: Secondary | ICD-10-CM

## 2018-04-01 DIAGNOSIS — Z79899 Other long term (current) drug therapy: Secondary | ICD-10-CM

## 2018-04-01 DIAGNOSIS — I2510 Atherosclerotic heart disease of native coronary artery without angina pectoris: Secondary | ICD-10-CM

## 2018-04-01 DIAGNOSIS — N2889 Other specified disorders of kidney and ureter: Secondary | ICD-10-CM

## 2018-04-01 DIAGNOSIS — Q600 Renal agenesis, unilateral: Secondary | ICD-10-CM

## 2018-04-01 DIAGNOSIS — N184 Chronic kidney disease, stage 4 (severe): Secondary | ICD-10-CM

## 2018-04-01 DIAGNOSIS — Z7982 Long term (current) use of aspirin: Secondary | ICD-10-CM

## 2018-04-01 DIAGNOSIS — R634 Abnormal weight loss: Secondary | ICD-10-CM

## 2018-04-01 DIAGNOSIS — Z951 Presence of aortocoronary bypass graft: Secondary | ICD-10-CM | POA: Insufficient documentation

## 2018-04-01 LAB — BASIC METABOLIC PANEL
ANION GAP: 7 (ref 5–15)
BUN: 32 mg/dL — AB (ref 6–20)
CALCIUM: 8.7 mg/dL — AB (ref 8.9–10.3)
CO2: 21 mmol/L — ABNORMAL LOW (ref 22–32)
CREATININE: 2.17 mg/dL — AB (ref 0.61–1.24)
Chloride: 110 mmol/L (ref 101–111)
GFR calc Af Amer: 32 mL/min — ABNORMAL LOW (ref >60)
GFR calc non Af Amer: 28 mL/min — ABNORMAL LOW (ref >60)
GLUCOSE: 112 mg/dL — AB (ref 65–99)
Potassium: 4.3 mmol/L (ref 3.5–5.1)
Sodium: 138 mmol/L (ref 135–145)

## 2018-04-01 LAB — CBC
HCT: 33.6 % — ABNORMAL LOW (ref 39.0–52.0)
Hemoglobin: 10.6 g/dL — ABNORMAL LOW (ref 13.0–17.0)
MCH: 29.4 pg (ref 26.0–34.0)
MCHC: 31.5 g/dL (ref 30.0–36.0)
MCV: 93.1 fL (ref 78.0–100.0)
Platelets: 250 10*3/uL (ref 150–400)
RBC: 3.61 MIL/uL — ABNORMAL LOW (ref 4.22–5.81)
RDW: 13.8 % (ref 11.5–15.5)
WBC: 5.7 10*3/uL (ref 4.0–10.5)

## 2018-04-01 LAB — PROTEIN, TOTAL: Total Protein: 5.7 g/dL — ABNORMAL LOW (ref 6.5–8.1)

## 2018-04-01 LAB — CHOLESTEROL, TOTAL: CHOLESTEROL: 116 mg/dL (ref 0–200)

## 2018-04-01 LAB — LACTATE DEHYDROGENASE: LDH: 205 U/L — ABNORMAL HIGH (ref 98–192)

## 2018-04-01 MED ORDER — ENSURE ENLIVE PO LIQD
237.0000 mL | Freq: Two times a day (BID) | ORAL | Status: DC
  Administered 2018-04-02 – 2018-04-09 (×10): 237 mL via ORAL

## 2018-04-01 NOTE — Progress Notes (Signed)
   Subjective: Resting in his bed today. Denied acute complaints and feeling much better following his thoracentesis. He is in agreement with continuing treatment.   Objective:  Vital signs in last 24 hours: Vitals:   03/31/18 1245 03/31/18 1315 03/31/18 1439 04/01/18 0127  BP: (!) 144/81 135/89 (!) 135/92 117/68  Pulse: 85 80 95 73  Resp: (!) 21 (!) 22 20   Temp:   (!) 97.5 F (36.4 C) 98.4 F (36.9 C)  TempSrc:   Oral Oral  SpO2: 96% 97% 99% 95%  Weight:   157 lb 6.5 oz (71.4 kg)   Height:   5\' 10"  (1.778 m)    Physical Exam  Constitutional: He appears well-developed and well-nourished. No distress.  HENT:  Head: Normocephalic and atraumatic.  Eyes: Conjunctivae are normal.  Cardiovascular: Normal rate and regular rhythm.  No murmur heard. Pulmonary/Chest: Effort normal. No stridor. No respiratory distress. He has decreased breath sounds in the right middle field and the right lower field.  Abdominal: Soft. Bowel sounds are normal. He exhibits no distension.  Musculoskeletal: He exhibits no edema.  Neurological: He is alert.  Skin: Skin is warm. He is not diaphoretic.  Psychiatric: He has a normal mood and affect.  Vitals reviewed.  Assessment/Plan:  Active Problems:   Pleural effusion on right  Assessment: Christopher Wilson is a 77 yo Male with a PMHx notable for 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 This was accompanied by weight loss of 22lbs over the past several months and appears to be worsening. He visited the [facility] where a CT revealed a large right sided pleural effusion and mass. He opted out of a medically advised admission. Upon presentation here he was noted to be stable of room air but symptomatically uncomfortable and required admission due to the pleural effusion.   Plan: Right sided pulmonary mass and pleural effusion: This is greatly  concerning for malignancy due to the imaging appearance, bloody pleural fluid, exudative nature by lights criteria, history of weight loss and smoking history. [name] was performed by PCCM who agree that this is likely malignant. Cytology sent and report in process. They appear to be planning pleurex catheter is reaccumulation occurs rapidly. Appear to be planning a repeat thora vs bronch on the unlikely occurrence that the pleural fluid is negative for malignancy.  --CXR ordered as per pulmonology  --Cytology pending --Continue to monitor O2 sats and maintain above 88% --Will need to consult Oncology if positive  AKI: Resolved: Will continue to monitor his renal function with a BMP daily  HTN: Continue amlodipine 5mg  daily while inpatient. Most recent recorded BP 117/68.  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   Kathi Ludwig, MD 04/01/2018, 6:31 AM Pager: Pager# [phone]

## 2018-04-01 NOTE — Progress Notes (Signed)
Initial Nutrition Assessment  DOCUMENTATION CODES:   Not applicable  INTERVENTION:    Ensure Enlive po BID, each supplement provides 350 kcal and 20 grams of protein  NUTRITION DIAGNOSIS:   Increased nutrient needs related to chronic illness as evidenced by estimated needs  GOAL:   Patient will meet greater than or equal to 90% of their needs  MONITOR:   PO intake, Supplement acceptance, Labs, Skin, Weight trends, I & O's  REASON FOR ASSESSMENT:   Malnutrition Screening Tool  ASSESSMENT:   77 yo Male who is a smoker with extensive cardiac hx including CAD with CABG x2 ('89 and redo 2002), multiple stents, CKD, hx HTN presented 6/3 with several weeks hx worsening fatigue, dyspnea, weakness, weight loss.  Was seen 5/27 at [facility] with large lung mass noted as well as AKI.  RD spoke with pt at bedside. He reports his appetite is good now, however, was not PTA. Pt states he was consuming 1 meal per day and snacks in between.  Also shares he had the flu back in 10/2017.  His appetite was decreased and initially lost weight at that time. Endorses a total of 22 lb weight loss (12%) in the last 4 months. Significant for time frame.  Labs and medications reviewed. LDH 205 (H).  NUTRITION - FOCUSED PHYSICAL EXAM:  Unable to complete at this time. Suspect malnutrition.  Diet Order:   Diet Order           Diet regular Room service appropriate? Yes; Fluid consistency: Thin  Diet effective now         EDUCATION NEEDS:   No education needs have been identified at this time  Skin:  Skin Assessment: Reviewed RN Assessment  Last BM:  6/4  Height:   Ht Readings from Last 1 Encounters:  03/31/18 5\' 10"  (1.778 m)   Weight:   Wt Readings from Last 1 Encounters:  03/31/18 157 lb 6.5 oz (71.4 kg)   BMI:  Body mass index is 22.59 kg/m.  Estimated Nutritional Needs:   Kcal:  1800-2000  Protein:  90-105 gm  Fluid:  1.8-2.0 L  Arthur Holms, RD, LDN Pager  #: [phone] After-Hours Pager #: [phone]

## 2018-04-01 NOTE — Progress Notes (Addendum)
Name: Christopher Wilson MRN: [id number] DOB: 1940-12-20    ADMISSION DATE:  03/31/2018 CONSULTATION DATE:  6/3  REFERRING MD :  EDP  CHIEF COMPLAINT:  Lung mass, pleural effusion   BRIEF PATIENT DESCRIPTION: 77yo male smoker with extensive cardiac hx including CAD with CABG x2 ('89 and redo 2002), multiple stents, CKD, hx HTN presented 6/3 with several weeks hx worsening fatigue, dyspnea, weakness, weight loss.  Was seen 5/27 at [place] with large lung mass noted as well as AKI.  He declined admission at that time and returned to [facility] 6/3 with same s/s.   SIGNIFICANT EVENTS  Thoracentesis 6/3>>   STUDIES:  CT chest 5/27>>> Large right perihilar mass versus round pneumonia. The complete interruption of the bronchus intermedius favors malignancy. Postobstructive atelectasis of the right middle and right lower lobes.   Interstitial thickening in the right upper lobe may represent lymphangitic spread of disease or an element of edema.  Large loculated right pleural effusion.  Right apical 14 mm secondary nodule versus scarring.  Underlying upper lobe predominant paraseptal emphysema.   HISTORY OF PRESENT ILLNESS:   77 yo male  current every day smoker  with extensive cardiac hx including CAD with CABG x2 ('89 and redo 2002), multiple stents, CKD, hx HTN presented 6/3 with several weeks hx worsening fatigue, dyspnea, weakness, weight loss.  Was seen 5/27 at [place] with large lung mass noted as well as AKI.  He declined admission at that time and returned to [facility] 6/3 with same s/s.   States he has been progressively weak for months.   28lbs weight loss since February.  75 pack year smoking hx, still actively smoking.  Denies chest pain, hemoptysis, purulent sputum, fevers, syncope.    SUBJECTIVE:  States he feels better today.  Wearing 2 L [place]  VITAL SIGNS: Temp:  [97.5 F (36.4 C)-98.4 F (36.9 C)] 97.5 F (36.4 C) (06/04 0852) Pulse Rate:  [73-98] 98 (06/04  0852) Resp:  [15-22] 15 (06/04 0852) BP: (89-158)/(68-92) 89/68 (06/04 0852) SpO2:  [74 %-99 %] 74 % (06/04 0852) Weight:  [157 lb 6.5 oz (71.4 kg)] 157 lb 6.5 oz (71.4 kg) (06/03 1439)  PHYSICAL EXAMINATION: General:  Thin male sitting on side of bed wearing [place] Neuro:  MAE x 4, A&O x 3, Appropriate HEENT:  NCAT, Mm moist, no JVD, no palpable LAN Cardiovascular:  S1, S2, RRR, No RMG Lungs:  resps even non labored on 2 L  [place], remains diminished R post 6/4 Abdomen:  Soft, non tender, ND,  +bs  Musculoskeletal:  Warm and dry, no edema, no obvious deformities   Recent Labs  Lab 03/31/18 0830 04/01/18 0256  NA 137 138  K 4.6 4.3  CL 107 110  CO2 19* 21*  BUN 32* 32*  CREATININE 2.53* 2.17*  GLUCOSE 99 112*   Recent Labs  Lab 03/31/18 0830 04/01/18 0256  HGB 11.8* 10.6*  HCT 38.0* 33.6*  WBC 6.7 5.7  PLT 285 250   Dg Chest 2 View  Result Date: 03/31/2018 CLINICAL DATA:  Shortness of breath. EXAM: CHEST - 2 VIEW COMPARISON:  03/24/2018 chest CT FINDINGS: Extensive opacification of the right chest with leftward mediastinal shift. This is a combination of lung mass and pleural effusion that show signs of loculation. Normal heart size. Status post CABG and stenting. 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 These results were called by telephone at the time of interpretation on 03/31/2018 at 8:48 am  to PA Harris who verbally acknowledged these results. IMPRESSION: Unchanged extensive opacification of the right hemithorax, a combination of lung mass and pleural effusion by CT. The pleural effusion show signs of loculation and is likely malignant. Electronically Signed   By: Monte Fantasia M.D.   On: 03/31/2018 08:58   Dg Chest Port 1 View  Result Date: 03/31/2018 CLINICAL DATA:  Status post right thoracentesis.  Smoker. EXAM: PORTABLE CHEST 1 VIEW COMPARISON:  03/31/2018. FINDINGS: Decreased size of a large right pleural effusion with less adjacent atelectasis. No pneumothorax.  Clear left lung. The left lung remains hyperexpanded. Stable enlarged cardiac silhouette, post CABG changes and coronary artery stent. Mild thoracic spine and right shoulder degenerative changes. IMPRESSION: 1. Decreased size of a large right pleural effusion following thoracentesis with less associated atelectasis. 2. No pneumothorax. 3. Stable cardiomegaly and changes of COPD. Electronically Signed   By: Claudie Revering M.D.   On: 03/31/2018 15:38   Cultures 6/3>> Pleural Fluid>>    6/3: Pleural Fluid Analysis Protein>> 3.7 g/dL LDH>> 558 U/L ( Serum LDH 205) Cholesterol>> PH>> WBC>> 461 Neutrophils>> 5% Lymphs >> 46% Monocytes>> 45% Eos >> 4%   ASSESSMENT / PLAN:  Lung mass - highly suspicious for malignancy Pleural effusion >> appears  exudative per Light's criteria Pathology is in progress A: Stable post Thoracentesis 6/3 Sats > 92% on 2 L PLAN -  Follow  pleural fluid cytology - if in conclusive will need FOB for tissue dx  CXR to assess for re-accumulation PRN BD Smoking cessation counseling   Hypoxemia A:  Stable on 2 L ND Does not use any inhalers at home  Plan:  Titrate and wean  oxygen for sats 88-92% Consider prn albuterol/ maintenance  inhaler at DC Consider PFT's at DC>> Pulmonary follow up   AKI on CKD -  likely r/t dehydration in setting poor PO intake  Improving with hydration PLAN -  Continue  gentle IVF  Trend BMET/ UO Medicine to address further    Magdalen Spatz, AGACNP-BC [place] [facility] Pager: [phone] or [phone]  04/01/2018  11:20 AM  Attending Note:  77 year old male smoker with an extensive cardiac history presenting with wt loss, SOB, DOE and pleural effusion.  Thoracentesis done on 6/3 with exudative fluid and cytology/pathology pending.  On exam, breathing better, some BS on the right compared to before.  I reviewed CXR myself, pleural effusion much improved but presents.  Discussed with PCCM-NP.    Pleural effusion:  - F/U on cytology  - F/U CXR  - If re accumulates quickly and pathology is positive will need pleurex catheter  DOE: improved with thora but continues likely due to lung mass  - May need some rehab  Hypoxemia:  - Titrate O2 for sat of 88-92%  Lung mass:  - If thora is negative, then repeat thora, if negative again then bronch  Wt loss: due to oncologic process most likely             - Nutritionist to see patient             - May need to consider megace..etc.  PCCM will follow.  Patient seen and examined, agree with above note.  I dictated the care and orders written for this patient under my direction.  Rush Farmer, [place]

## 2018-04-01 NOTE — Evaluation (Signed)
Physical Therapy Evaluation Patient Details Name: Christopher Wilson MRN: [id number] DOB: 1941-01-28 Today's Date: 04/01/2018   History of Present Illness  Pt is a 77 y.o. male admitted 03/31/18 with progressive DOE; worked up with R-sided pleural effusion and lung mass. S/p thoracentesis 6/3. PMH includes HTN, CAD, CKD III, CAD (s/p CABG), STEMI, tobacco abuse.    Clinical Impression  Patient evaluated by Physical Therapy with no further acute PT needs identified. PTA, pt indep and works part-time as Administrator; has noted increasing weakness and DOE past couple of weeks, limited to hosuehold amb due to this. Today, pt amb 500' independently; DOE 2/4 requriing one standing rest break; SpO2 remained >94% on RA throughout session. Educ on energy conservation techniques and fall risk reduction. All education has been completed and the patient has no further questions. Encouraged pt to continue ambulating during hospital admission (RN aware) PT is signing off. Thank you for this referral.    Follow Up Recommendations No PT follow up(declined HHPT)    Equipment Recommendations  None recommended by PT    Recommendations for Other Services       Precautions / Restrictions Precautions Precautions: None Restrictions Weight Bearing Restrictions: No      Mobility  Bed Mobility Overal bed mobility: Independent                Transfers Overall transfer level: Independent                  Ambulation/Gait Ambulation/Gait assistance: Independent Ambulation Distance (Feet): 500 Feet Assistive device: None Gait Pattern/deviations: Step-through pattern;Decreased stride length Gait velocity: Decreased Gait velocity interpretation: 1.31 - 2.62 ft/sec, indicative of limited community ambulator General Gait Details: Amb 500' with supervision, progressing to indep with higher level balance activities  Stairs            Wheelchair Mobility    Modified Rankin (Stroke Patients  Only)       Balance Overall balance assessment: No apparent balance deficits (not formally assessed)                                           Pertinent Vitals/Pain Pain Assessment: No/denies pain    Home Living Family/patient expects to be discharged to:: Private residence Living Arrangements: Alone Available Help at Discharge: Family;Available PRN/intermittently Type of Home: House(condo) Home Access: Level entry     Home Layout: One level Home Equipment: None      Prior Function Level of Independence: Independent         Comments: Still works part time as truck Tree surgeon        Extremity/Trunk Assessment   Upper Extremity Assessment Upper Extremity Assessment: Overall WFL for tasks assessed    Lower Extremity Assessment Lower Extremity Assessment: Overall WFL for tasks assessed       Communication   Communication: No difficulties  Cognition Arousal/Alertness: Awake/alert Behavior During Therapy: WFL for tasks assessed/performed Overall Cognitive Status: Within Functional Limits for tasks assessed                                        General Comments      Exercises     Assessment/Plan    PT Assessment Patent does not need any further PT services  PT Problem List         PT Treatment Interventions      PT Goals (Current goals can be found in the Care Plan section)  Acute Rehab PT Goals Patient Stated Goal: Return home PT Goal Formulation: All assessment and education complete, DC therapy    Frequency     Barriers to discharge        Co-evaluation               AM-PAC PT "6 Clicks" Daily Activity  Outcome Measure Difficulty turning over in bed (including adjusting bedclothes, sheets and blankets)?: None Difficulty moving from lying on back to sitting on the side of the bed? : None Difficulty sitting down on and standing up from a chair with arms (e.g., wheelchair, bedside  commode, etc,.)?: None Help needed moving to and from a bed to chair (including a wheelchair)?: None Help needed walking in hospital room?: None Help needed climbing 3-5 steps with a railing? : A Little 6 Click Score: 23    End of Session Equipment Utilized During Treatment: Gait belt Activity Tolerance: Patient tolerated treatment well Patient left: in bed;with call bell/phone within reach;with nursing/sitter in room Nurse Communication: Mobility status PT Visit Diagnosis: Other abnormalities of gait and mobility (R26.89)    Time: 2979-8921 PT Time Calculation (min) (ACUTE ONLY): 13 min   Charges:   PT Evaluation $PT Eval Moderate Complexity: 1 Mod     PT G Codes:       Mabeline Caras, PT, DPT Acute Rehab Services  Pager: [place] 04/01/2018, 9:37 AM

## 2018-04-02 ENCOUNTER — Telehealth: Payer: Self-pay

## 2018-04-02 ENCOUNTER — Inpatient Hospital Stay (HOSPITAL_COMMUNITY): Payer: Medicare HMO

## 2018-04-02 DIAGNOSIS — N183 Chronic kidney disease, stage 3 (moderate): Secondary | ICD-10-CM

## 2018-04-02 DIAGNOSIS — F17200 Nicotine dependence, unspecified, uncomplicated: Secondary | ICD-10-CM

## 2018-04-02 LAB — BASIC METABOLIC PANEL
ANION GAP: 4 — AB (ref 5–15)
BUN: 27 mg/dL — AB (ref 6–20)
CO2: 22 mmol/L (ref 22–32)
Calcium: 8.6 mg/dL — ABNORMAL LOW (ref 8.9–10.3)
Chloride: 114 mmol/L — ABNORMAL HIGH (ref 101–111)
Creatinine, Ser: 2.09 mg/dL — ABNORMAL HIGH (ref 0.61–1.24)
GFR calc Af Amer: 34 mL/min — ABNORMAL LOW (ref >60)
GFR, EST NON AFRICAN AMERICAN: 29 mL/min — AB (ref >60)
Glucose, Bld: 101 mg/dL — ABNORMAL HIGH (ref 65–99)
POTASSIUM: 4.7 mmol/L (ref 3.5–5.1)
SODIUM: 140 mmol/L (ref 135–145)

## 2018-04-02 LAB — BODY FLUID CELL COUNT WITH DIFFERENTIAL
Eos, Fluid: 3 %
LYMPHS FL: 65 %
Monocyte-Macrophage-Serous Fluid: 16 % — ABNORMAL LOW (ref 50–90)
NEUTROPHIL FLUID: 16 % (ref 0–25)
Total Nucleated Cell Count, Fluid: 935 cu mm (ref 0–1000)

## 2018-04-02 LAB — PROTEIN, PLEURAL OR PERITONEAL FLUID: TOTAL PROTEIN, FLUID: 3.4 g/dL

## 2018-04-02 LAB — LACTATE DEHYDROGENASE, PLEURAL OR PERITONEAL FLUID: LD, Fluid: 656 U/L — ABNORMAL HIGH (ref 3–23)

## 2018-04-02 LAB — PATHOLOGIST SMEAR REVIEW

## 2018-04-02 LAB — PH, BODY FLUID: pH, Body Fluid: 7.6

## 2018-04-02 NOTE — Progress Notes (Signed)
Subjective: The patient was lying in his bed today upon entering the room. He stated that he felt comfortable and was in agreement with the plan to await the results of his initial thoracentesis prior to determining a course of action.  He denies headache, fever, chills, nausea, vomiting, abdominal pain, chest pain, and continues to endorse improvement with regard to his respiratory status and breathing.  Objective:  Vital signs in last 24 hours: Vitals:   04/01/18 0127 04/01/18 0852 04/01/18 1646 04/02/18 0057  BP: 117/68 (!) 89/68 130/76 (!) 86/64  Pulse: 73 98 85 92  Resp:  15 14   Temp: 98.4 F (36.9 C) (!) 97.5 F (36.4 C) 98.1 F (36.7 C) 98.2 F (36.8 C)  TempSrc: Oral Oral Oral Oral  SpO2: 95% (!) 74% 93% 95%  Weight:      Height:       Physical Exam  Constitutional: He appears well-developed and well-nourished. No distress.  HENT:  Head: Normocephalic and atraumatic.  Eyes: Conjunctivae and EOM are normal.  Neck: Normal range of motion.  Cardiovascular: Normal rate and regular rhythm.  No murmur heard. Pulmonary/Chest: Effort normal. No stridor. No respiratory distress. He has decreased breath sounds in the right middle field and the right lower field.  Abdominal: Soft. Bowel sounds are normal. He exhibits no distension.  Musculoskeletal: He exhibits no edema.  Skin: He is not diaphoretic.   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 This was accompanied by weight loss of 22lbs over the past several months with progressive worsening with weight loss and  dyspnea since onset. He visited the [facility] where a CT revealed a large right sided pleural effusion and mass approximate 1 week prior to this admission. He opted out of a medically advised admission. Upon presentation here he was noted to be stable of room air but symptomatically uncomfortable and required admission due to the pleural effusion.  Thoracentesis was performed which revealed a bloody aspirate concerning for an exudative process, most likely malignant versus TB.   Plan: Right sided pulmonary mass and pleural effusion: This is greatly concerning for malignancy due to the imaging appearance, bloody pleural fluid, exudative nature by lights criteria, history of weight loss and smoking history but we are unable to rule out TB and other causes until pathology returns. [name] was performed by PCCM who agree that this is likely malignant. Cytology sent and report in process. They appear to be planning pleurex catheter is reaccumulation occurs rapidly.  Also appear to be planning a repeat thora vs bronch on the unlikely occurrence that the pleural fluid is negative for malignancy.  --CXR ordered as per pulmonology with insignificant variation as compared to the previous postthoracentesis CXR --Cytology report pending --Continue to monitor O2 sats and maintain above 88% --Will need to consult Oncology if positive --CBC in a.m.  AKI on CKD stage III: AKI resolved: Will continue to monitor his renal function with a BMP daily given his history of chronic kidney disease with acute worsening. --BMP  in a.m.  HTN: Discontinue amlodipine 5mg  daily. Most recent recorded BP concerning for mild hypotension which appears to be intermittent and asymptomatic.   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   Kathi Ludwig, MD 04/02/2018, 6:48 AM Pager: Pager# [phone]

## 2018-04-02 NOTE — Progress Notes (Signed)
Cytology pending from prior thoracentesis.  PCCM will follow up once returned. Please call sooner if new needs arise.   Noe Gens, NP-C [place] [facility] Pgr: [phone] or if no answer [phone] 04/02/2018, 11:21 AM

## 2018-04-02 NOTE — Progress Notes (Signed)
Medicine attending: I personally examined this patient today together with resident physician Dr. Kathi Ludwig and I concur with his evaluation and management plan which we discussed together. I reviewed his pleural fluid cytology with our pathologist.  Unfortunately although there are scant suspicious cells, the sample was not diagnostic. I would favor proceeding directly to bronchoscopy with biopsy at this time so as not to further delay diagnosis given low yield of pleural fluid cytology and contemporary need for sufficient tissue to do molecular studies to determine optimal treatment of non-small cell lung cancer. I agree he may need a Pleurx catheter placed if there is rapid reaccumulation of his pleural fluid.  Traditionally we would do a VATS procedure and pleurodesis which afforded excellent control.  More recently some people favor the long-term drainage catheters which I personally find to be cumbersome for the patient.

## 2018-04-02 NOTE — Progress Notes (Signed)
Name: Christopher Wilson MRN: [id number] DOB: 1941-06-02    ADMISSION DATE:  03/31/2018 CONSULTATION DATE:  6/3  REFERRING MD :  EDP  CHIEF COMPLAINT:  Lung mass, pleural effusion   BRIEF PATIENT DESCRIPTION: 77yo male smoker with extensive cardiac hx including CAD with CABG x2 ('89 and redo 2002), multiple stents, CKD, hx HTN presented 6/3 with several weeks hx worsening fatigue, dyspnea, weakness, weight loss.  Was seen 5/27 at [place] with large lung mass noted as well as AKI.  He declined admission at that time and returned to [facility] 6/3 with same s/s.   SIGNIFICANT EVENTS  Thoracentesis 6/3>>   STUDIES:  CT chest 5/27>>> Large right perihilar mass versus round pneumonia. The complete interruption of the bronchus intermedius favors malignancy. Postobstructive atelectasis of the right middle and right lower lobes.   Interstitial thickening in the right upper lobe may represent lymphangitic spread of disease or an element of edema.  Large loculated right pleural effusion.  Right apical 14 mm secondary nodule versus scarring.  Underlying upper lobe predominant paraseptal emphysema.   HISTORY OF PRESENT ILLNESS:   77 yo male  current every day smoker  with extensive cardiac hx including CAD with CABG x2 ('89 and redo 2002), multiple stents, CKD, hx HTN presented 6/3 with several weeks hx worsening fatigue, dyspnea, weakness, weight loss.  Was seen 5/27 at [place] with large lung mass noted as well as AKI.  He declined admission at that time and returned to [facility] 6/3 with same s/s.   States he has been progressively weak for months.   28lbs weight loss since February.  75 pack year smoking hx, still actively smoking.  Denies chest pain, hemoptysis, purulent sputum, fevers, syncope.    SUBJECTIVE:  States he feels better today.  Wearing 2 L [place]  VITAL SIGNS: Temp:  [98.1 F (36.7 C)-98.3 F (36.8 C)] 98.3 F (36.8 C) (06/05 0854) Pulse Rate:  [85-92] 88 (06/05  0854) Resp:  [14-16] 16 (06/05 0854) BP: (86-130)/(64-76) 108/74 (06/05 0854) SpO2:  [93 %-96 %] 96 % (06/05 0854)  PHYSICAL EXAMINATION: General:  Thin male sitting on side of bed wearing [place] Neuro:  MAE x 4, A&O x 3, Appropriate HEENT:  NCAT, Mm moist, no JVD, no palpable LAN Cardiovascular:  S1, S2, RRR, No RMG Lungs:  resps even non labored on 2 L  [place], remains diminished R post 6/4 Abdomen:  Soft, non tender, ND,  +bs  Musculoskeletal:  Warm and dry, no edema, no obvious deformities   Recent Labs  Lab 03/31/18 0830 04/01/18 0256 04/02/18 0234  NA 137 138 140  K 4.6 4.3 4.7  CL 107 110 114*  CO2 19* 21* 22  BUN 32* 32* 27*  CREATININE 2.53* 2.17* 2.09*  GLUCOSE 99 112* 101*   Recent Labs  Lab 03/31/18 0830 04/01/18 0256  HGB 11.8* 10.6*  HCT 38.0* 33.6*  WBC 6.7 5.7  PLT 285 250   Dg Chest Port 1 View  Result Date: 04/02/2018 CLINICAL DATA:  Shortness of breath. Right pleural effusion. Coronary artery disease. EXAM: PORTABLE CHEST 1 VIEW COMPARISON:  03/31/2018 FINDINGS: Large right pleural effusion shows mild increase in size since previous study. Collapse or consolidation of the right mid and lower lung is again seen. Left lung remains clear. Heart size is stable. Prior CABG again noted. IMPRESSION: Slight increase in size of large right pleural effusion. Stable right lung collapse or consolidation. Electronically Signed   By: Jenny Reichmann  Kris Hartmann M.D.   On: 04/02/2018 09:47   Dg Chest Port 1 View  Result Date: 03/31/2018 CLINICAL DATA:  Status post right thoracentesis.  Smoker. EXAM: PORTABLE CHEST 1 VIEW COMPARISON:  03/31/2018. FINDINGS: Decreased size of a large right pleural effusion with less adjacent atelectasis. No pneumothorax. Clear left lung. The left lung remains hyperexpanded. Stable enlarged cardiac silhouette, post CABG changes and coronary artery stent. Mild thoracic spine and right shoulder degenerative changes. IMPRESSION: 1. Decreased size of a large right  pleural effusion following thoracentesis with less associated atelectasis. 2. No pneumothorax. 3. Stable cardiomegaly and changes of COPD. Electronically Signed   By: Claudie Revering M.D.   On: 03/31/2018 15:38   Cultures 6/3>> Pleural Fluid>>    6/3: Pleural Fluid Analysis Protein>> 3.7 g/dL LDH>> 558 U/L ( Serum LDH 205) Cholesterol>> PH>> WBC>> 461 Neutrophils>> 5% Lymphs >> 46% Monocytes>> 45% Eos >> 4%   ASSESSMENT / PLAN:  77 year old male smoker with an extensive cardiac history presenting with wt loss, SOB, DOE and pleural effusion as well as a lung mass.  Thoracentesis done on 6/3 with exudative fluid and cytology with atypical cells.  On exam, feels better but BS are improving post thora  77 year old male smoker with an extensive cardiac history presenting with wt loss, SOB, DOE and pleural effusion.  Thoracentesis done on 6/3 with exudative fluid and cytology/pathology pending.  On exam, breathing better, some BS on the right compared to before.  I reviewed CXR myself, pleural effusion recurring.  Discussed with PCCM-NP.   Pleural effusion:  - Will call pathology and patient's primary (who is an oncologist as well)  - CXR in AM  - If further reaccummulation may need a pleurex catheter  DOE: improved with thora but continues likely due to lung mass  - Rehab and O2  Hypoxemia:  - Titrate O2 for sat of 88-92%  - Will need an ambulatory desaturation study prior to discharge for ?home O2, doubtful  Lung mass:  - If H/O and pathology agree that cells are unsatisfactory then will need another thora  Wt loss: due to oncologic process most likely             - Nutritionist to see patient             - May need to consider megace..etc.  PCCM will follow.  Rush Farmer, M.D. [facility] Pulmonary/Critical Care Medicine. Pager: [phone]. After hours pager: [phone].

## 2018-04-02 NOTE — Telephone Encounter (Signed)
Copied from [place] [id number]. Topic: Appointment Scheduling - New Patient >> Apr 02, 2018 12:13 PM Lennox Solders wrote: New patient has been scheduled for your office. Provider: wendling Date of Appointment: 04-16-18 Route to department's PEC pool.

## 2018-04-02 NOTE — Procedures (Signed)
Thoracentesis Procedure Note  Pre-operative Diagnosis: Right pleural effusion  Post-operative Diagnosis: same  Indications: Right pleural effusion  Procedure Details  Consent: Informed consent was obtained. Risks of the procedure were discussed including: infection, bleeding, pain, pneumothorax.  Under sterile conditions the patient was positioned. Betadine solution and sterile drapes were utilized.  1% plain lidocaine was used to anesthetize the 6 rib space. Fluid was obtained without any difficulties and minimal blood loss.  A dressing was applied to the wound and wound care instructions were provided.   Findings 1500 ml of bloody pleural fluid was obtained. A sample was sent to Pathology for cytogenetics, flow, and cell counts, as well as for infection analysis.  Complications:  None; patient tolerated the procedure well.          Condition: stable  Plan A follow up chest x-ray was ordered. Bed Rest for 0 hours. Tylenol 650 mg. for pain.  U/S used during the procedure.  Rush Farmer, M.D. [facility] Pulmonary/Critical Care Medicine. Pager: [phone]. After hours pager: [phone].

## 2018-04-03 ENCOUNTER — Telehealth: Payer: Self-pay | Admitting: *Deleted

## 2018-04-03 DIAGNOSIS — Z9889 Other specified postprocedural states: Secondary | ICD-10-CM | POA: Insufficient documentation

## 2018-04-03 LAB — BASIC METABOLIC PANEL
ANION GAP: 7 (ref 5–15)
BUN: 27 mg/dL — ABNORMAL HIGH (ref 6–20)
CALCIUM: 8.9 mg/dL (ref 8.9–10.3)
CO2: 23 mmol/L (ref 22–32)
Chloride: 110 mmol/L (ref 101–111)
Creatinine, Ser: 1.92 mg/dL — ABNORMAL HIGH (ref 0.61–1.24)
GFR calc Af Amer: 37 mL/min — ABNORMAL LOW (ref >60)
GFR calc non Af Amer: 32 mL/min — ABNORMAL LOW (ref >60)
GLUCOSE: 90 mg/dL (ref 65–99)
POTASSIUM: 4.7 mmol/L (ref 3.5–5.1)
SODIUM: 140 mmol/L (ref 135–145)

## 2018-04-03 LAB — GRAM STAIN

## 2018-04-03 LAB — CHOLESTEROL, BODY FLUID: Cholesterol, Fluid: 68 mg/dL

## 2018-04-03 LAB — CBC
HCT: 33.7 % — ABNORMAL LOW (ref 39.0–52.0)
Hemoglobin: 10.6 g/dL — ABNORMAL LOW (ref 13.0–17.0)
MCH: 29.2 pg (ref 26.0–34.0)
MCHC: 31.5 g/dL (ref 30.0–36.0)
MCV: 92.8 fL (ref 78.0–100.0)
PLATELETS: 262 10*3/uL (ref 150–400)
RBC: 3.63 MIL/uL — AB (ref 4.22–5.81)
RDW: 13.8 % (ref 11.5–15.5)
WBC: 6.1 10*3/uL (ref 4.0–10.5)

## 2018-04-03 LAB — GLUCOSE, CAPILLARY: Glucose-Capillary: 128 mg/dL — ABNORMAL HIGH (ref 65–99)

## 2018-04-03 MED ORDER — LIDOCAINE 5 % EX PTCH
1.0000 | MEDICATED_PATCH | Freq: Once | CUTANEOUS | Status: AC
  Administered 2018-04-03: 1 via TRANSDERMAL
  Filled 2018-04-03: qty 1

## 2018-04-03 NOTE — Progress Notes (Signed)
Subjective: The patient was resting in his bed today upon entering the room. He stated that he was told not to eat overnight and as such had held off on breakfast. There was no clear indication as to why he was told not to eat. We advised him that when he needed to be NPO we would place the order.   Objective:  Vital signs in last 24 hours: Vitals:   04/02/18 0057 04/02/18 0854 04/02/18 1655 04/02/18 2342  BP: (!) 86/64 108/74 117/62 (!) 96/55  Pulse: 92 88 85 77  Resp:  16 20 17   Temp: 98.2 F (36.8 C) 98.3 F (36.8 C) 97.9 F (36.6 C) 98.3 F (36.8 C)  TempSrc: Oral Oral Oral Oral  SpO2: 95% 96% 96% 94%  Weight:      Height:       Physical Exam  Constitutional: He appears well-developed and well-nourished. No distress.  Eyes: Conjunctivae are normal.  Cardiovascular: Normal rate and regular rhythm.  Pulmonary/Chest: Effort normal. No stridor. No respiratory distress. He has decreased breath sounds in the right lower field.  Abdominal: Soft. Bowel sounds are normal. He exhibits no distension.  Musculoskeletal: He exhibits no edema.  Neurological: He is alert.  Skin: Skin is warm. He is not diaphoretic.  Vitals reviewed.  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 This was accompanied by weight loss of 22lbs over the past several months with progressive worsening with weight loss and dyspnea since onset. He visited the [facility] where a CT revealed a large right sided pleural effusion and mass approximate 1 week prior to this admission. He opted out of a  medically advised admission. Upon presentation here he was noted to be stable of room air but symptomatically uncomfortable and required admission due to the pleural effusion.  Thoracentesis was performed which revealed a bloody aspirate concerning for an exudative process, most likely malignant versus TB. His [name] was repeated, results are pending.   Plan: Right sided pulmonary mass and pleural effusion: This is greatly concerning for malignancy due to the imaging appearance, bloody pleural fluid, exudative nature by lights criteria, history of weight loss and smoking history but we are unable to rule out TB and other causes until pathology returns. [name] was performed by PCCM who agree that this is likely malignant. Cytology sent but report was inconoclusive. They appear to be planning pleurex catheter is reaccumulation occurs rapidly.  A repeat thora was performed for diagnostic and theraputic purposes with a bronch planned on the occurrence that the pleural fluid is negative for malignancy the second time.  --Postthoracentesis CXR without pneumo with decreased effusion --Cytology report pending --Continue to monitor O2 sats and maintain above 88% --Will need to consult Oncology if positive --CBC daily  AKI on CKD stage III: AKI resolved: Will continue to monitor his renal function with a BMP daily given his history of chronic kidney disease with acute worsening. Cr 1.92 improved from 2.09 the prior day.  --BMP  HTN: Discontinued amlodipine 5mg  daily. Most recent recorded  BP concerning for mild hypotension which appears to be intermittent and asymptomatic. 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   Kathi Ludwig, MD 04/03/2018, 6:58 AM Pager: Pager# [phone]

## 2018-04-03 NOTE — Consult Note (Signed)
[facility] CM Primary Care Navigator  04/03/2018  Christopher Wilson 1941/01/25 [id number]  Met withpatient at the bedsideto identify possible discharge needs. Patientreports being "weak, easily tired and with difficulty breathing whichhad ledto this admission.(right pleural effusion, right lung mass)  Patient reports having no primary care provider but was scheduled to establish care and follow-up with Dr. Riki Sheer with [place] [facility] after discharge.   [facility] [address] [facility].  Patient states that hehas beenmanaginghismedications at home with use of "pill box" system filled once a week.  Patient has been driving prior to admissionbut daughter [name]- lives closeby) can providetransportation to hisdoctors'appointments if needed after discharge.  Patientlives at home alone, independent with self care and states that he is the caregiver for himself.His daughter can assist him when needed upon discharge.  Anticipateddischarge planishomeper patient.  Patientvoiced understanding to see primary care provider (establish care)when hereturnshome, for a post discharge follow-upwithin1- 2weeksor sooner if needs arise.Patient letter (with PCP's contact number) was provided asareminder. Patient has an appointment already scheduled for 6/19.   Discussed withpatient regarding [facility] CM services available for health management at home buthedeniesany needs or concerns at this point. Patient verbalized understandingto seekreferral from primary care provider to [facility] care management if deemednecessaryand appropriate for anyservices in the future.  [facility] care management information was provided for future needs thathe may have.  Patienthadverbally agreedand opted forEMMIcalls tofollowup hisrecoveryat home.   Referral made for  [facility] General calls after discharge.   For additional questions please contact:  Edwena Felty A. Kaleiah Kutzer, BSN, RN-BC [facility] PRIMARY CARE Navigator Cell: [phone]

## 2018-04-03 NOTE — Progress Notes (Signed)
Medicine attending: I examined this patient today together with resident physician Dr. Kathi Ludwig and I concur with his evaluation and management plan. Additional 1.5 L of right pleural fluid removed by critical care physician yesterday. The patient is asymptomatic at rest.  Fluid level has decreased to about one fourth the way up the right hemithorax by percussion.  Increased air movement above. I anticipate that he will need bronchoscopy with biopsy to establish a definitive diagnosis and to get enough tissue to do molecular studies. He is clinically stable at this time.  Anticipate there will be a delay in getting the cytology back on yesterday's  specimen.  Will consider discharge if not available by tomorrow morning and then bronchoscopy as an outpatient as well as referral to medical oncology.

## 2018-04-03 NOTE — Telephone Encounter (Signed)
Copied from [place] [id number]. Topic: Appointment Scheduling - New Patient >> Apr 02, 2018 12:13 PM Lennox Solders wrote: New patient has been scheduled for your office. Provider: wendling Date of Appointment: 04-16-18 Route to department's PEC pool.

## 2018-04-03 NOTE — Progress Notes (Signed)
Name: Christopher Wilson MRN: [id number] DOB: [birth year]    ADMISSION DATE:  03/31/2018 CONSULTATION DATE:  6/3  REFERRING MD :  EDP  CHIEF COMPLAINT:  Lung mass, pleural effusion   BRIEF PATIENT DESCRIPTION: 77yo male smoker with extensive cardiac hx including CAD with CABG x2 ('89 and redo 2002), multiple stents, CKD, hx HTN presented 6/3 with several weeks hx worsening fatigue, dyspnea, weakness, weight loss.  Was seen 5/27 at [place] with large lung mass noted as well as AKI.  He declined admission at that time and returned to [facility] 6/3 with same s/s.   SIGNIFICANT EVENTS  Thoracentesis 6/3>>   STUDIES:  CT chest 5/27>>> Large right perihilar mass versus round pneumonia. The complete interruption of the bronchus intermedius favors malignancy. Postobstructive atelectasis of the right middle and right lower lobes.   Interstitial thickening in the right upper lobe may represent lymphangitic spread of disease or an element of edema.  Large loculated right pleural effusion.  Right apical 14 mm secondary nodule versus scarring.  Underlying upper lobe predominant paraseptal emphysema.   HISTORY OF PRESENT ILLNESS:   76 yo male  current every day smoker  with extensive cardiac hx including CAD with CABG x2 ('89 and redo 2002), multiple stents, CKD, hx HTN presented 6/3 with several weeks hx worsening fatigue, dyspnea, weakness, weight loss.  Was seen 5/27 at [place] with large lung mass noted as well as AKI.  He declined admission at that time and returned to [facility] 6/3 with same s/s.   States he has been progressively weak for months.   28lbs weight loss since February.  75 pack year smoking hx, still actively smoking.  Denies chest pain, hemoptysis, purulent sputum, fevers, syncope.    SUBJECTIVE:  Feels better this AM  VITAL SIGNS: Temp:  [97.9 F (36.6 C)-98.3 F (36.8 C)] 98.2 F (36.8 C) (06/06 0753) Pulse Rate:  [77-85] 77 (06/06 0753) Resp:  [14-20] 14 (06/06  0753) BP: (96-117)/(55-62) 101/55 (06/06 0753) SpO2:  [94 %-96 %] 96 % (06/06 0753)  PHYSICAL EXAMINATION: General:  Thin male, resting in exam bed, NAD Neuro:  Alert and interactive, moving all ext to command HEENT:  [place]/AT, PERRL, EOM-I and MMM Cardiovascular:  RRR, Nl S1/S2 and -M/R/G Lungs:  Decreased BS on the left Abdomen:  Soft, NT, ND and +BS Musculoskeletal:  Warm and dry, no edema, no obvious deformities   Recent Labs  Lab 04/01/18 0256 04/02/18 0234 04/03/18 0549  NA 138 140 140  K 4.3 4.7 4.7  CL 110 114* 110  CO2 21* 22 23  BUN 32* 27* 27*  CREATININE 2.17* 2.09* 1.92*  GLUCOSE 112* 101* 90   Recent Labs  Lab 03/31/18 0830 04/01/18 0256 04/03/18 0549  HGB 11.8* 10.6* 10.6*  HCT 38.0* 33.6* 33.7*  WBC 6.7 5.7 6.1  PLT 285 250 262   Dg Chest Port 1 View  Result Date: 04/02/2018 CLINICAL DATA:  Status post RIGHT thoracentesis EXAM: PORTABLE CHEST 1 VIEW COMPARISON:  04/02/2018 and prior radiographs FINDINGS: Large RIGHT pleural effusion has decreased, now at least moderate. RIGHT LOWER lung consolidation/atelectasis noted. Cardiomegaly and CABG changes are present. There is no evidence of pneumothorax. IMPRESSION: Decreased RIGHT pleural effusion following thoracentesis-no evidence of pneumothorax. Residual RIGHT pleural effusion with RIGHT LOWER lung consolidation/atelectasis Electronically Signed   By: Margarette Canada M.D.   On: 04/02/2018 15:47   Dg Chest Port 1 View  Result Date: 04/02/2018 CLINICAL DATA:  Shortness of  breath. Right pleural effusion. Coronary artery disease. EXAM: PORTABLE CHEST 1 VIEW COMPARISON:  03/31/2018 FINDINGS: Large right pleural effusion shows mild increase in size since previous study. Collapse or consolidation of the right mid and lower lung is again seen. Left lung remains clear. Heart size is stable. Prior CABG again noted. IMPRESSION: Slight increase in size of large right pleural effusion. Stable right lung collapse or consolidation.  Electronically Signed   By: Earle Gell M.D.   On: 04/02/2018 09:47   Cultures 6/3>> Pleural Fluid>>    6/3: Pleural Fluid Analysis Protein>> 3.7 g/dL LDH>> 558 U/L ( Serum LDH 205) Cholesterol>> PH>> WBC>> 461 Neutrophils>> 5% Lymphs >> 46% Monocytes>> 45% Eos >> 4%  ASSESSMENT / PLAN:  77 year old male smoker with an extensive cardiac history presenting with wt loss, SOB, DOE and pleural effusion.  Repeat thoracentesis done and cytology is pending.  On exam, decreased BS on the bases L>R.  I reviewed CXR myself, improving aeration on CXR.  Discussed with PCCM-NP.   Pleural effusion:  - Pathology pending  - CXR in AM  - Will likely needs a pleurex catheter  DOE: improved with thora but continues likely due to lung mass  - Rehab and O2  Hypoxemia:  - Titrate O2 for sat of 88-92%  - Will need ambulatory desaturation study prior to d/c for O2  Lung mass:  - If H/O and pathology agree that cells are unsatisfactory then will need another thora  Wt loss: due to oncologic process most likely             - Nutritionist to see patient             - May need to consider megace..etc.  PCCM will follow.  Rush Farmer, M.D. [facility] Pulmonary/Critical Care Medicine. Pager: [phone]. After hours pager: [phone].

## 2018-04-03 NOTE — Care Management Note (Signed)
Case Management Note  Patient Details  Name: Christopher Wilson MRN: [id number] Date of Birth: December 08, 1940  Subjective/Objective:   From home alone, pta indep, presents with pl effusion, for thoracentesis ,await bx results, he is ambulatory, per pt eval no pt f/u needed.  Patient wanted NCM to schedule follow up apt with [facility], which is scheduled for 6/19 on [facility].                Action/Plan: DC home when ready.  Expected Discharge Date:                  Expected Discharge Plan:  Home/Self Care  In-House Referral:     Discharge planning Services  CM Consult, Follow-up appt scheduled  Post Acute Care Choice:    Choice offered to:     DME Arranged:    DME Agency:     HH Arranged:    HH Agency:     Status of Service:  Completed, signed off  If discussed at [employer] of Stay Meetings, dates discussed:    Additional Comments:  Zenon Mayo, RN 04/03/2018, 12:34 PM

## 2018-04-04 ENCOUNTER — Inpatient Hospital Stay (HOSPITAL_COMMUNITY): Payer: Medicare HMO

## 2018-04-04 DIAGNOSIS — R0602 Shortness of breath: Secondary | ICD-10-CM | POA: Insufficient documentation

## 2018-04-04 DIAGNOSIS — J181 Lobar pneumonia, unspecified organism: Secondary | ICD-10-CM

## 2018-04-04 DIAGNOSIS — R29898 Other symptoms and signs involving the musculoskeletal system: Secondary | ICD-10-CM

## 2018-04-04 DIAGNOSIS — R0609 Other forms of dyspnea: Secondary | ICD-10-CM

## 2018-04-04 DIAGNOSIS — Z8619 Personal history of other infectious and parasitic diseases: Secondary | ICD-10-CM

## 2018-04-04 DIAGNOSIS — J90 Pleural effusion, not elsewhere classified: Secondary | ICD-10-CM | POA: Insufficient documentation

## 2018-04-04 LAB — BASIC METABOLIC PANEL
Anion gap: 5 (ref 5–15)
BUN: 26 mg/dL — AB (ref 6–20)
CHLORIDE: 111 mmol/L (ref 101–111)
CO2: 24 mmol/L (ref 22–32)
CREATININE: 1.83 mg/dL — AB (ref 0.61–1.24)
Calcium: 8.8 mg/dL — ABNORMAL LOW (ref 8.9–10.3)
GFR calc Af Amer: 40 mL/min — ABNORMAL LOW (ref >60)
GFR calc non Af Amer: 34 mL/min — ABNORMAL LOW (ref >60)
Glucose, Bld: 102 mg/dL — ABNORMAL HIGH (ref 65–99)
Potassium: 5.2 mmol/L — ABNORMAL HIGH (ref 3.5–5.1)
SODIUM: 140 mmol/L (ref 135–145)

## 2018-04-04 LAB — BODY FLUID CULTURE: CULTURE: NO GROWTH

## 2018-04-04 MED ORDER — MUSCLE RUB 10-15 % EX CREA
TOPICAL_CREAM | CUTANEOUS | Status: DC | PRN
  Administered 2018-04-04 – 2018-04-06 (×5): via TOPICAL
  Filled 2018-04-04: qty 85

## 2018-04-04 NOTE — Progress Notes (Addendum)
Name: Christopher Wilson MRN: [id number] DOB: 17-Feb-1941    ADMISSION DATE:  03/31/2018 CONSULTATION DATE:  6/3  REFERRING MD :  EDP  CHIEF COMPLAINT:  Lung mass, pleural effusion   BRIEF PATIENT DESCRIPTION: 77yo male smoker with extensive cardiac hx including CAD with CABG x2 ('89 and redo 2002), multiple stents, CKD, hx HTN presented 6/3 with several weeks hx worsening fatigue, dyspnea, weakness, weight loss.  Was seen 5/27 at [place] with large lung mass noted as well as AKI.  He declined admission at that time and returned to [facility] 6/3 with same s/s.   SIGNIFICANT EVENTS  Thoracentesis 6/3, and again 6/5   STUDIES:  CT chest 5/27>>> Large right perihilar mass versus round pneumonia. The complete interruption of the bronchus intermedius favors malignancy. Postobstructive atelectasis of the right middle and right lower lobes.   Interstitial thickening in the right upper lobe may represent lymphangitic spread of disease or an element of edema.  Large loculated right pleural effusion.  Right apical 14 mm secondary nodule versus scarring.  Underlying upper lobe predominant paraseptal emphysema.   HISTORY OF PRESENT ILLNESS:   77 yo male  current every day smoker  with extensive cardiac hx including CAD with CABG x2 ('89 and redo 2002), multiple stents, CKD, hx HTN presented 6/3 with several weeks hx worsening fatigue, dyspnea, weakness, weight loss.  Was seen 5/27 at [place] with large lung mass noted as well as AKI.  He declined admission at that time and returned to [facility] 6/3 with same s/s.   States he has been progressively weak for months.   28lbs weight loss since February.  75 pack year smoking hx, still actively smoking.  Denies chest pain, hemoptysis, purulent sputum, fevers, syncope.    SUBJECTIVE:  Feels better this AM, eating breakfast, states he still feels weak. On RA with 98% sat CXR 6/7>> Continued right lower lung consolidation and right  effusion. Unchanged  VITAL SIGNS: Temp:  [97.8 F (36.6 C)-98.5 F (36.9 C)] 97.8 F (36.6 C) (06/07 0901) Pulse Rate:  [85-87] 87 (06/07 0901) Resp:  [18] 18 (06/07 0007) BP: (91-115)/(54-69) 108/69 (06/07 0901) SpO2:  [93 %-98 %] 98 % (06/07 0901)  PHYSICAL EXAMINATION: General:  Thin elderly male sitting on side of bed eating breakfast Neuro:  Awake , alert and appropriate, MAE x 4, A&O x 3 HEENT:  [place]/AT, PERRL,  MM Pink and Moist, No LAD Cardiovascular:  S1, S2, RRR,NO RMG Lungs:  Decreased BS on the right, few rhonchi which clear with cough Abdomen:  Soft, flat,  NT, ND and +BS, Body mass index is 22.59 kg/m. Musculoskeletal:  Warm and dry, no edema, no obvious deformities   Recent Labs  Lab 04/02/18 0234 04/03/18 0549 04/04/18 0340  NA 140 140 140  K 4.7 4.7 5.2*  CL 114* 110 111  CO2 22 23 24   BUN 27* 27* 26*  CREATININE 2.09* 1.92* 1.83*  GLUCOSE 101* 90 102*   Recent Labs  Lab 03/31/18 0830 04/01/18 0256 04/03/18 0549  HGB 11.8* 10.6* 10.6*  HCT 38.0* 33.6* 33.7*  WBC 6.7 5.7 6.1  PLT 285 250 262   Dg Chest Port 1 View  Result Date: 04/02/2018 CLINICAL DATA:  Status post RIGHT thoracentesis EXAM: PORTABLE CHEST 1 VIEW COMPARISON:  04/02/2018 and prior radiographs FINDINGS: Large RIGHT pleural effusion has decreased, now at least moderate. RIGHT LOWER lung consolidation/atelectasis noted. Cardiomegaly and CABG changes are present. There is no evidence of pneumothorax.  IMPRESSION: Decreased RIGHT pleural effusion following thoracentesis-no evidence of pneumothorax. Residual RIGHT pleural effusion with RIGHT LOWER lung consolidation/atelectasis Electronically Signed   By: Margarette Canada M.D.   On: 04/02/2018 15:47   Cultures Pleural Fluid 6/3>> No growth 3 days Pleural Fluid 6/5>> GS: WBC present, PMN and Mononuclear, no organisms  Pathology 6/3: Pleural Fluid Rare Atypical Cells  6/5: Pleural Fluid    6/3: Pleural Fluid Analysis Protein>> 3.7  g/dL LDH>> 558 U/L ( Serum LDH 205) Cholesterol>> 68 PH>> WBC>> 461 Neutrophils>> 5% Lymphs >> 46% Monocytes>> 45% Eos >> 4%  6/5 Pleural Fluid Analysis LDH:656 Protein 3.4 Cholesterol WBC>> 935 Neutrophils>> 16% Lymphs>> 65% MOno>> 16% Eos> 3%     ASSESSMENT / PLAN:  77 year old male smoker with an extensive cardiac history presenting with wt loss, SOB, DOE and pleural effusion.   Pulmonary: Pleural Effusion>> decreased right effusion after thora on 6/5 DOE Hypoxemia Lung mass Weight loss Physical Deconditioning A:  Awake and alert, appropriate Deconditioned Sats 97% on RA  Plan: CXR prn to Trend effusion for re-accumulation  Consider Pleurex cath if continues to reaccumulate Follow pleural effusion pathology and micro Pathology thus far in conclusive>> 6/3 >> atypical cells>> may need repeat thora/ FOB ( OP) ( 6/5 fluid pending)>>  Titrate oxygen for sats 88-92% Ambulatory desaturation study prior to discharge Will need rehab 2/2 deconditioning Consider Pulmonary Rehab as OP Dietary consult for weight loss Consider Megace for appetite enhancement     PCCM  to follow.  Magdalen Spatz, AGACNP-BC [place]. Pager: [phone]. 04/04/2018 9:45 AM  Attending Note:  77 year old male smoker with an extensive cardiac history presenting with wt loss, SOB, DOE, pleural effusion and a lung mass.  Initial thora had atypical cells but not diagnostic.  Second thora done on 6/5 and results are pending.  On exam, decreased BS on the right but clear on the left.  I reviewed CXR myself, fluid still present but had not dramatically increased.  Lung mass noted.  Discussed with PCCM-NP.  Pleural effusion:  - F/U fluid pathology  - If pathology is negative then will need a bronchoscopy but that can be done as outpatient  - Per IMTS attending note, considering discharge and f/u as OP.  If patient is still here on Monday and pathology is  negative then will perform bronch.  - Will need pleurex catheter for fluid reaccumulation once pathology is acquired  Lung mass:  - F/U pleural fluid pathology  - Bronch plans as above  DOE:  - O2 with activity as needed  Deconditioning:  - PT  PCCM will see again on Monday if patient is still here.  Patient seen and examined, agree with above note.  I dictated the care and orders written for this patient under my direction.  Rush Farmer, MD [phone]  AddendumDamaris Schooner with pathology, malignant cells are seen but unable to identify the type.  Spoke with IMTS, they are willing to keep patient if he is willing to stay.  Spoke with patient, he is willing to stay to get work up complete and treatment plan is in action.  Spoke with IR, will plan on pleurex placement.  Hold plavix for bronch on Monday at 9 AM.  NPO after midnight on Sunday midnight.  Rush Farmer, M.D. [facility] Pulmonary/Critical Care Medicine. Pager: [phone]. After hours pager: [phone].

## 2018-04-04 NOTE — Progress Notes (Addendum)
Subjective: The patient was resting in his chair today upon entering the room. He again stated that he was told not to eat but no definitive information to that effect could be found. As such, he was told to eat breakfast as await his thoracentesis biopsy results prior to eating lunch.  Objective:  Vital signs in last 24 hours: Vitals:   04/02/18 2342 04/03/18 0753 04/03/18 1643 04/04/18 0007  BP: (!) 96/55 (!) 101/55 115/68 (!) 91/54  Pulse: 77 77 87 85  Resp: 17 14 18 18   Temp: 98.3 F (36.8 C) 98.2 F (36.8 C) 98.1 F (36.7 C) 98.5 F (36.9 C)  TempSrc: Oral Oral Oral Oral  SpO2: 94% 96% 98% 93%  Weight:      Height:       Physical Exam  Constitutional: He appears well-developed and well-nourished. No distress.  Eyes: Conjunctivae are normal.  Cardiovascular: Normal rate and regular rhythm.  No murmur heard. Pulmonary/Chest: Effort normal. No stridor. No respiratory distress. He has decreased breath sounds in the right middle field and the right lower field.  Abdominal: Soft. Bowel sounds are normal. He exhibits no distension.  Musculoskeletal: He exhibits no edema.  Neurological: He is alert.  Skin: Skin is warm. He is not diaphoretic.  Psychiatric: He has a normal mood and affect.  Vitals reviewed.  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 This was accompanied by weight loss of 22lbs over the past several monthswith progressive worsening with weight loss and dyspnea since onset. He visited the [facility] where a CT revealed a  large right sided pleural effusion and massapproximate 1 week prior to this admission. He opted out of a medically advised admission. Upon presentation here he was noted to be stable of room air but symptomatically uncomfortable and required admission due to the pleural effusion.Thoracentesis was performed which revealed a bloody aspirate concerning for an exudative process, most likely malignant versus TB. His [name] was repeated, results are pending prior to consideration of a bronchoscopy.    Plan: Right sided pulmonary mass and pleural effusion: This is greatly concerning for malignancy due to the imaging appearance, bloody pleural fluid, exudative nature by lights criteria, history of weight loss and smoking historybut we are unable to rule out TB and other causes until pathology returns which will also potentially better assist with deliniating the type of malignancy. [name] was performed by PCCM who agree that this is likely malignant. Cytology sent but report was inconoclusive. They appear to be planning pleurex catheter is reaccumulation occurs rapidly.A repeat thora was performed for diagnostic and theraputic purposes with a bronch planned on the occurrence that the pleural fluid is negative for malignancy the second time.  --Postthoracentesis CXR without pneumo with decreased effusion the prior day --Cytologyreportpending, potential preliminary result this am --Continue to monitor O2 sats and maintain above 88% --Will need to consult Oncology if positive  Addendum: Plavix held for Bronchoscopy on Monday   AKIon CKD stage III: AKI resolved: Will continue to monitor  his renal function with a BMP dailygiven his history of chronic kidney disease with acute worsening. Cr 1.83 improved from 2.53 on admission. Potassium was slightly elevated without other significant abnormalities.  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 Began 8 weeks prior. Patient had begun self treating with  probiotics due to a history of C diff in the past. He denied recent antibiotic use and has not clear signs or symptoms consistent with a C diff infection aside from partially formed loose watery stool. WBC count 6.0 the prior day, afebrile, vitals stable, patient appears euvolemic. No clear indication that this is carcinoid syndrome at this time given his lack of additional symptoms but we will consider a urinary 24 hour 5-HIAA. --Stool WBC, Fat and Osmo ordered  HTN: Discontinuedamlodipine 5mg  daily. Most recent recorded BP concerning for mild hypotension which appears to be intermittent and asymptomatic.Will continue to monitor   Diet: Regular Code: Full Fluids: n/a DVT PPX: Heparin due to renal disease Dispo: Anticipated discharge in approximately 1-2 days.  Kathi Ludwig, MD 04/04/2018, 7:03 AM Pager: Pager# [phone]

## 2018-04-04 NOTE — Progress Notes (Signed)
Medicine attending: I examined this patient today together with resident physician Dr. Kathi Ludwig and I concur with his evaluation and management plan. Cytology from second right thoracentesis done on June 5 should be read out later today.  If not diagnostic he needs a bronchoscopy with biopsy. He is clinically stable now status post 2 thoracentesis procedures.  He is oxygenating well on room air. We could consider discharge if bronchoscopy cannot be done until next week and medical oncology referral for an outpatient PET scan and management. Summary: 77 year old man with underlying asymptomatic coronary artery disease status post CABG x2, long-standing cigarette smoker, who presented with increasing weakness, dyspnea, and weight loss and was found to have a right postobstructive pneumonia secondary to a large right perihilar lung mass with associated large right pleural effusion.  No gross signs of spread outside the chest.  Initial nondiagnostic thoracentesis except for the fact that the fluid was hemorrhagic which also supports a malignant diagnosis. If second specimen nondiagnostic he will need bronchoscopy with biopsy.

## 2018-04-04 NOTE — Progress Notes (Signed)
Medicine attending: Addendum to earlier note: Cytology on the second sample of pleural fluid is diagnostic for malignancy but again because of scant cellular material subtype cannot be determined.  Special stains will be attempted on this material.  Dr. Nelda Marseille aware of results.  We will hold Plavix over the weekend and plan for bronchoscopy with biopsy on Monday.  In addition, Dr. Edison Nasuti recommends we proceed with placement of a pleural drainage catheter and interventional radiology has kindly evaluated the patient and will schedule this procedure. I also spoke with Dr. Earlie Server, medical oncology, who is happy to see Mr. [name] after discharge and coordinate his oncologic care.

## 2018-04-04 NOTE — Care Management Important Message (Signed)
Important Message  Patient Details  Name: Christopher Wilson MRN: [id number] Date of Birth: 03-07-1941   Medicare Important Message Given:  Yes    [name] Montine Circle 04/04/2018, 3:27 PM

## 2018-04-04 NOTE — Consult Note (Signed)
Chief Complaint: Patient was seen in consultation today for right tunneled pleurx catheter placement Chief Complaint  Patient presents with  . Shortness of Breath   at the request of Dr Annye Rusk  Supervising Physician: Markus Daft  Patient Status: [facility]  History of Present Illness: Christopher Wilson is a 77 y.o. male   Hx CABG x 2; smoker; dyspnea Weakness and wt loss  Rt rapid re accumulating pleural effusion Cyto from 6/5 thora 1.5 L  (CCM) shows malignant cells without definite type per Dr Nickolas Madrid 6/3: 1.4 L Request for Right pleural effusion tunneled pleurx catheter placement per Dr Beryle Beams  Dr Nelda Marseille in room and plans to Bronch pt on Monday Will dc Plavix now.  IN+M Team discussed with Dr Vangie Bicker procedure  Past Medical History:  Diagnosis Date  . ACS (acute coronary syndrome), with ST elevation but stable coronary arteries on cath 06/27/12 06/27/2012  . CAD (coronary artery disease), with CABG 1989 and re-do Cabg 2002 X 3 vesssels.  progressive disease with Stent to Lt. Main, and 1st diag 2006, stent to VG to LCX 2007, with known occlusion history o  06/27/2012  . CKD (chronic kidney disease) stage 3, GFR 30-59 ml/min (HCC) 06/27/2012  . 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 Coronary artery disease   . GERD (gastroesophageal reflux disease)   . Hyperlipidemia LDL goal < 70 06/27/2012  . Hypertension   . Myocardial infarction ([place])   . Peptic ulcer   . STEMI (ST elevation myocardial infarction) ([place]) 06/27/2012  . Tobacco abuse 06/27/2012    Past Surgical History:  Procedure Laterality Date  . coratid arterty surgery    . CORONARY ANGIOPLASTY WITH STENT PLACEMENT    . CORONARY ARTERY BYPASS GRAFT    . LCEA    . LEFT HEART CATH N/A 06/27/2012   Procedure: LEFT HEART CATH;  Surgeon: Troy Sine, MD;  Location: [facility] CATH LAB;  Service: Cardiovascular;  Laterality: N/A;  . triple bipass     Hx of CABG and RE do  . VASCULAR SURGERY       Allergies: Patient has no known allergies.  Medications: Prior to Admission medications   Medication Sig Start Date End Date Taking? Authorizing Provider  albuterol (PROVENTIL HFA;VENTOLIN HFA) 108 (90 Base) MCG/ACT inhaler Inhale 2 puffs into the lungs every 6 (six) hours as needed for wheezing or shortness of breath.   Yes [provider]  amLODipine (NORVASC) 5 MG tablet TAKE 1 TABLET BY MOUTH ONCE DAILY 08/29/17  Yes Troy Sine, MD  amoxicillin-clavulanate (AUGMENTIN) 875-125 MG tablet Take 1 tablet by mouth 2 (two) times daily. One po bid x 7 days 03/24/18  Yes Pfeiffer, Jeannie Done, MD  aspirin 81 MG chewable tablet Chew 81 mg by mouth daily.   Yes [provider]  atorvastatin (LIPITOR) 80 MG tablet TAKE ONE TABLET BY MOUTH DAILY AT 6 P.M. NEEDS APPOINTMENT FOR FUTURE REFILLS. 01/15/17  Yes Troy Sine, MD  clopidogrel (PLAVIX) 75 MG tablet Take 1 tablet (75 mg total) by mouth daily. KEEP OV. 03/18/18  Yes Troy Sine, MD  isosorbide mononitrate (IMDUR) 60 MG 24 hr tablet TAKE ONE-HALF TABLET BY MOUTH ONCE DAILY (PLEASE  MAKE  APPOINTMENT  FOR  FURTHER  REFILLS) 06/20/17  Yes Troy Sine, MD  metoprolol tartrate (LOPRESSOR) 25 MG tablet TAKE ONE TABLET BY MOUTH TWICE DAILY (PLEASE  CONTACT  OFFICE  FOR  ADDITIONAL  REFILLS  ) 07/08/17  Yes  Troy Sine, MD  nitroGLYCERIN (NITROSTAT) 0.4 MG SL tablet Place 1 tablet (0.4 mg total) under the tongue every 5 (five) minutes as needed for chest pain. 08/29/17  Yes Troy Sine, MD  Probiotic Product (PROBIOTIC-10 PO) Take 1 tablet by mouth daily.   Yes [provider]  azithromycin (ZITHROMAX Z-PAK) 250 MG tablet 1 tablet daily starting tomorrow for 4 days. 03/24/18   Charlesetta Shanks, MD     Family History  Problem Relation Age of Onset  . Stroke Mother   . Heart attack Father   . Stroke Maternal Aunt     Social History   Socioeconomic History  . Marital status: Divorced    Spouse name: Not on  file  . Number of children: Not on file  . Years of education: Not on file  . Highest education level: Not on file  Occupational History  . Not on file  Social Needs  . Financial resource strain: Not on file  . Food insecurity:    Worry: Not on file    Inability: Not on file  . Transportation needs:    Medical: Not on file    Non-medical: Not on file  Tobacco Use  . Smoking status: Current Every Day Smoker    Packs/day: 1.50  . Smokeless tobacco: Never Used  Substance and Sexual Activity  . Alcohol use: Yes    Alcohol/week: 6.0 oz    Types: 10 Cans of beer per week  . Drug use: No  . Sexual activity: Not on file  Lifestyle  . Physical activity:    Days per week: Not on file    Minutes per session: Not on file  . Stress: Not on file  Relationships  . Social connections:    Talks on phone: Not on file    Gets together: Not on file    Attends religious service: Not on file    Active member of club or organization: Not on file    Attends meetings of clubs or organizations: Not on file    Relationship status: Not on file  Other Topics Concern  . Not on file  Social History Narrative  . Not on file     Review of Systems: A 12 point ROS discussed and pertinent positives are indicated in the HPI above.  All other systems are negative.  Review of Systems  Constitutional: Positive for activity change, fatigue and unexpected weight change. Negative for fever.  Respiratory: Positive for cough and shortness of breath.   Cardiovascular: Negative for chest pain.  Gastrointestinal: Negative for abdominal pain.  Musculoskeletal: Positive for back pain.  Neurological: Positive for weakness.  Psychiatric/Behavioral: Negative for behavioral problems and confusion.    Vital Signs: BP 108/69 (BP Location: Left Arm)   Pulse 87   Temp 97.8 F (36.6 C) (Oral)   Resp 18   Ht 5\' 10"  (1.778 m)   Wt 157 lb 6.5 oz (71.4 kg)   SpO2 98%   BMI 22.59 kg/m   Physical Exam   Constitutional: He is oriented to person, place, and time.  Cardiovascular: Normal rate and regular rhythm.  Pulmonary/Chest: Effort normal. He has decreased breath sounds in the right upper field, the right middle field and the right lower field. He has wheezes in the right upper field, the right middle field and the right lower field.  Abdominal: Soft. Bowel sounds are normal.  Neurological: He is alert and oriented to person, place, and time.  Skin: Skin is  warm and dry.  Psychiatric: He has a normal mood and affect. His behavior is normal.  Nursing note and vitals reviewed.   Imaging: Dg Chest 2 View  Result Date: 03/31/2018 CLINICAL DATA:  Shortness of breath. EXAM: CHEST - 2 VIEW COMPARISON:  03/24/2018 chest CT FINDINGS: Extensive opacification of the right chest with leftward mediastinal shift. This is a combination of lung mass and pleural effusion that show signs of loculation. Normal heart size. Status post CABG and stenting. 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 These results were called by telephone at the time of interpretation on 03/31/2018 at 8:48 am to [place] who verbally acknowledged these results. IMPRESSION: Unchanged extensive opacification of the right hemithorax, a combination of lung mass and pleural effusion by CT. The pleural effusion show signs of loculation and is likely malignant. Electronically Signed   By: Monte Fantasia M.D.   On: 03/31/2018 08:58   Ct Chest Wo Contrast  Result Date: 03/24/2018 CLINICAL DATA:  Shortness of breath and weakness for 2 weeks. EXAM: CT CHEST WITHOUT CONTRAST TECHNIQUE: Multidetector CT imaging of the chest was performed following the standard protocol without IV contrast. COMPARISON:  Chest radiograph 06/27/2012 FINDINGS: Cardiovascular: Mildly enlarged heart. Mild pericardial thickening. Calcific atherosclerotic disease of the coronary arteries and aorta. Post CABG. Tortuosity of the thoracic aorta. Mediastinum/Nodes:  Pretracheal and subcarinal lymphadenopathy. Esophagus and thyroid gland appear normal. Lungs/Pleura: There is a large right perihilar mass, with occlusion of the bronchus intermedius. Postobstructive atelectasis of the right middle and lower lobes prevent detailed evaluation and measurement. There is a loculated large in size pleural effusion. There is pre-existing upper lobe predominant paraseptal emphysema. Subpleural scarring versus pulmonary nodule with peripheral calcifications seen in the right lung apex measuring 13.8 mm. Interstitial edema versus lymphangitic spread of malignancy causes increased interstitial markings in the right upper lobe. Mild linear scarring versus atelectasis in the left lower lobe. Upper Abdomen: The right kidney is extremely atrophic or surgically absent. 2.5 cm hypoattenuated mass in the midpole region of the left kidney. Left nephrolithiasis. No evidence of hydronephrosis. Musculoskeletal: No suspicious osseous lesions. Anterior wedging deformity of L1 vertebral body with chronic appearance. IMPRESSION: Large right perihilar mass versus round pneumonia. The complete interruption of the bronchus intermedius favors malignancy. Postobstructive atelectasis of the right middle and right lower lobes. Interstitial thickening in the right upper lobe may represent lymphangitic spread of disease or an element of edema. Large loculated right pleural effusion. Right apical 14 mm secondary nodule versus scarring. Underlying upper lobe predominant paraseptal emphysema. 2.5 cm hypoattenuated mass in the midpole region of the left kidney, incompletely evaluated due to lack of IV contrast. Surgically absent or extremely atrophic right kidney. Aortic Atherosclerosis (ICD10-I70.0) and Emphysema (ICD10-J43.9). These results were called by telephone at the time of interpretation on 03/24/2018 at 4:44 pm to Dr. Charlesetta Shanks , who verbally acknowledged these results. Electronically Signed   By: Fidela Salisbury M.D.   On: 03/24/2018 16:47   Dg Chest Port 1 View  Result Date: 04/04/2018 CLINICAL DATA:  Shortness of breath EXAM: PORTABLE CHEST 1 VIEW COMPARISON:  04/02/2018 FINDINGS: Again noted is the right pleural effusion and right lower lung consolidation, stable. Prior CABG. Heart is mildly enlarged. Left lung clear. No acute bony abnormality. IMPRESSION: Continued right lower lung consolidation and right effusion. Unchanged. Cardiomegaly. Electronically Signed   By: Rolm Baptise M.D.   On: 04/04/2018 10:12   Dg Chest Port 1 View  Result Date: 04/02/2018 CLINICAL  DATA:  Status post RIGHT thoracentesis EXAM: PORTABLE CHEST 1 VIEW COMPARISON:  04/02/2018 and prior radiographs FINDINGS: Large RIGHT pleural effusion has decreased, now at least moderate. RIGHT LOWER lung consolidation/atelectasis noted. Cardiomegaly and CABG changes are present. There is no evidence of pneumothorax. IMPRESSION: Decreased RIGHT pleural effusion following thoracentesis-no evidence of pneumothorax. Residual RIGHT pleural effusion with RIGHT LOWER lung consolidation/atelectasis Electronically Signed   By: Margarette Canada M.D.   On: 04/02/2018 15:47   Dg Chest Port 1 View  Result Date: 04/02/2018 CLINICAL DATA:  Shortness of breath. Right pleural effusion. Coronary artery disease. EXAM: PORTABLE CHEST 1 VIEW COMPARISON:  03/31/2018 FINDINGS: Large right pleural effusion shows mild increase in size since previous study. Collapse or consolidation of the right mid and lower lung is again seen. Left lung remains clear. Heart size is stable. Prior CABG again noted. IMPRESSION: Slight increase in size of large right pleural effusion. Stable right lung collapse or consolidation. Electronically Signed   By: Earle Gell M.D.   On: 04/02/2018 09:47   Dg Chest Port 1 View  Result Date: 03/31/2018 CLINICAL DATA:  Status post right thoracentesis.  Smoker. EXAM: PORTABLE CHEST 1 VIEW COMPARISON:  03/31/2018. FINDINGS: Decreased size of a  large right pleural effusion with less adjacent atelectasis. No pneumothorax. Clear left lung. The left lung remains hyperexpanded. Stable enlarged cardiac silhouette, post CABG changes and coronary artery stent. Mild thoracic spine and right shoulder degenerative changes. IMPRESSION: 1. Decreased size of a large right pleural effusion following thoracentesis with less associated atelectasis. 2. No pneumothorax. 3. Stable cardiomegaly and changes of COPD. Electronically Signed   By: Claudie Revering M.D.   On: 03/31/2018 15:38    Labs:  CBC: Recent Labs    03/24/18 1558 03/31/18 0830 04/01/18 0256 04/03/18 0549  WBC 5.7 6.7 5.7 6.1  HGB 11.5* 11.8* 10.6* 10.6*  HCT 35.3* 38.0* 33.6* 33.7*  PLT 277 285 250 262    COAGS: Recent Labs    03/31/18 0830  INR 1.03    BMP: Recent Labs    04/01/18 0256 04/02/18 0234 04/03/18 0549 04/04/18 0340  NA 138 140 140 140  K 4.3 4.7 4.7 5.2*  CL 110 114* 110 111  CO2 21* 22 23 24   GLUCOSE 112* 101* 90 102*  BUN 32* 27* 27* 26*  CALCIUM 8.7* 8.6* 8.9 8.8*  CREATININE 2.17* 2.09* 1.92* 1.83*  GFRNONAA 28* 29* 32* 34*  GFRAA 32* 34* 37* 40*    LIVER FUNCTION TESTS: Recent Labs    03/21/18 1048 03/24/18 1558 03/31/18 0830 04/01/18 0256  BILITOT 0.4 0.6 0.7  --   AST 13 18 20   --   ALT 9 13* 18  --   ALKPHOS 119* 97 100  --   PROT 6.9 6.9 7.0 5.7*  ALBUMIN 3.8 3.1* 3.1*  --     TUMOR MARKERS: No results for input(s): AFPTM, CEA, CA199, CHROMGRNA in the last 8760 hours.  Assessment and Plan:  Rapid re accumulating Rt pleural effusion +malignant cells-- scant; on Thora fluid 6/5 For bronch Mon with CCM Will plan for Rt tunneled PleurX catheter placement Tues 6/11 DC Plavix now (ok with Dr Beryle Beams) Pt is aware of procedure benefits and risks including but not limited to Infection; bleeding; pneumothorax; damage to surrounding structures Pt is agreeable to prceed Consent signed andin chart Rec: Thora as needed per  CCM   Thank you for this interesting consult.  I greatly enjoyed meeting Fernandez O Wilson and look forward  to participating in their care.  A copy of this report was sent to the requesting provider on this date.  Electronically Signed: Lavonia Drafts, PA-C 04/04/2018, 2:31 PM   I spent a total of 40 Minutes    in face to face in clinical consultation, greater than 50% of which was counseling/coordinating care for Rt tunneled PleurX catheter placement

## 2018-04-05 ENCOUNTER — Inpatient Hospital Stay (HOSPITAL_COMMUNITY): Payer: Medicare HMO

## 2018-04-05 DIAGNOSIS — I10 Essential (primary) hypertension: Secondary | ICD-10-CM

## 2018-04-05 LAB — LACTOFERRIN, FECAL, QUALITATIVE: LACTOFERRIN, FECAL, QUAL: NEGATIVE

## 2018-04-05 NOTE — Progress Notes (Signed)
Subjective: The patient was resting on the edge of his bed today upon entering the room.  He states that he is in agreement with the plan to undergo bronchoscopy on Monday to remain hospitalized due to his rapidly reaccumulating pleural effusion.  He understands that he is to receive placement of Pleurx catheter on 04/08/2018 and is in agreement with the current evaluation and plan.  He understands that if discharged home he will undoubtedly develop respiratory distress prior to the  outpatient completion of such procedures.  Objective:  Vital signs in last 24 hours: Vitals:   04/04/18 0901 04/04/18 1643 04/05/18 0024 04/05/18 0822  BP: 108/69 (!) 87/64 93/65 91/71   Pulse: 87 (!) 102 96 94  Resp:   15 14  Temp: 97.8 F (36.6 C) 97.6 F (36.4 C) 98.3 F (36.8 C) 98.7 F (37.1 C)  TempSrc: Oral Oral Oral Oral  SpO2: 98% 100% 96% 95%  Weight:      Height:       Physical exam: General: Alert and oriented x4, no acute distress, afebrile, nondiaphoretic Cardiovascular: RRR no murmurs rubs or gallops auscultated Pulmonary: Bilateral upper lung fields clear to auscultation there is diminished auscultation of the right mid and lower lung fields with dullness to percussion of the same.  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 This was accompanied by weight loss of 22lbs over the past several monthswith progressive worsening with weight loss and dyspnea since onset. He visited the [facility] where a CT revealed a large right sided pleural effusion and massapproximate 1 week prior to this admission. He opted out of a medically advised admission. Upon presentation here he was noted to be stable of room air but symptomatically uncomfortable and required admission due to the pleural effusion.Thoracentesis was performed which revealed a bloody aspirate concerning for an exudative process, most likely malignant versus TB.His [name] was repeated, results are concerning for malignancy but are insufficient to begin treatment without further evaluation.  As such bronchoscopy has been scheduled for Monday and he will need a Pleurx catheter placed to the rapidly accumulating nature of the fluid.   Plan: Right sided pulmonary mass and pleural effusion: As above the patient's procedure is scheduled for bronchoscopy and has been referred to oncology.  We are holding the patient's Plavix dose procedures are completed. --Postthoracentesis CXRwithout pneumo but indicating slight increase in the pleural effusion today --Cytologyreportindicating presence of malignant cells with further evaluation to come --Continue to monitor O2 sats and maintain above 88%  AKIon CKD stage III: AKI resolved: Will continue to monitor his renal function.Cr 1.83 improved from 2.53 on admission.Potassium was slightly elevated without other significant abnormalities.  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 Began 8 weeks prior. Patient had begun self treating with  probiotics due to a history of C diff in the past. He denied recent antibiotic use and has not clear signs or symptoms consistent with a C diff infection aside from partially formed loose watery stool. WBC count 6.0 the prior day, afebrile, vitals stable,  patient appears euvolemic. No clear indication that this is carcinoid syndrome at this time given his lack of additional symptoms. --Stool WBC, Fat and Osmo ordered --Stool WBC negative  HTN: Hold antihypertensives due to hypotension. Most recent recorded BP concerning for mild hypotension which appears to be intermittent and asymptomatic.Will continue to monitor  Diet: Regular Code: Full Fluids: n/a DVT PPX: Heparin due to renal disease Dispo: Anticipated discharge in approximately 1-2 days.  Kathi Ludwig, MD 04/05/2018, 10:09 AM Pager: Pager# [phone]

## 2018-04-05 NOTE — Progress Notes (Signed)
  Date: 04/05/2018  Patient name: Christopher Wilson  Medical record number: [id number]  Date of birth: 10-08-1941   I have seen and evaluated this patient and I have discussed the plan of care with the house staff. Please see Dr. Nelma Rothman note for complete details. I concur with his findings with the following additions/corrections:   Mild elevated in K today (5.2).  Recheck tomorrow, monitor for acute changes.  Bronchoscopy planned for Monday.   Sid Falcon, MD 04/05/2018, 2:28 PM

## 2018-04-06 ENCOUNTER — Inpatient Hospital Stay (HOSPITAL_COMMUNITY): Payer: Medicare HMO

## 2018-04-06 LAB — BASIC METABOLIC PANEL
ANION GAP: 7 (ref 5–15)
BUN: 24 mg/dL — AB (ref 6–20)
CHLORIDE: 109 mmol/L (ref 101–111)
CO2: 24 mmol/L (ref 22–32)
Calcium: 8.9 mg/dL (ref 8.9–10.3)
Creatinine, Ser: 1.76 mg/dL — ABNORMAL HIGH (ref 0.61–1.24)
GFR calc Af Amer: 42 mL/min — ABNORMAL LOW (ref >60)
GFR calc non Af Amer: 36 mL/min — ABNORMAL LOW (ref >60)
GLUCOSE: 111 mg/dL — AB (ref 65–99)
POTASSIUM: 5.1 mmol/L (ref 3.5–5.1)
Sodium: 140 mmol/L (ref 135–145)

## 2018-04-06 MED ORDER — RAMELTEON 8 MG PO TABS
8.0000 mg | ORAL_TABLET | Freq: Every day | ORAL | Status: DC
  Administered 2018-04-06 – 2018-04-08 (×3): 8 mg via ORAL
  Filled 2018-04-06 (×5): qty 1

## 2018-04-06 MED ORDER — IPRATROPIUM-ALBUTEROL 0.5-2.5 (3) MG/3ML IN SOLN: 3.0000 mL | Freq: Four times a day (QID) | RESPIRATORY_TRACT | Status: DC | PRN

## 2018-04-06 MED ORDER — IPRATROPIUM-ALBUTEROL 0.5-2.5 (3) MG/3ML IN SOLN
3.0000 mL | Freq: Four times a day (QID) | RESPIRATORY_TRACT | Status: DC
  Administered 2018-04-06: 3 mL via RESPIRATORY_TRACT
  Filled 2018-04-06: qty 3

## 2018-04-06 NOTE — Progress Notes (Signed)
Subjective: [name] feel that his breathing is comfortable today. He does continue to have fatigue. He ask about the details of the procedure and post procedure plans.   Objective:  Vital signs in last 24 hours: Vitals:   04/05/18 0024 04/05/18 0822 04/05/18 1609 04/06/18 0008  BP: 93/65 91/71 91/62  107/85  Pulse: 96 94 90 94  Resp: 15 14 16 16   Temp: 98.3 F (36.8 C) 98.7 F (37.1 C) 98.2 F (36.8 C) 97.6 F (36.4 C)  TempSrc: Oral Oral Oral Oral  SpO2: 96% 95% 98% 96%  Weight:      Height:       General: no acute distress, appears fatigued  Cardiac: regular rate and rhythm, no murmur appreciated  Pulm: normal work of breathing, dullness to percussion from the right lung base up to the level of the scapula, some diffuse wheezing  GI: abdomen is non tender, soft   Assessment/Plan:  Active Problems:   Unilateral congenital absence of kidney   Pleural effusion on right   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 This was accompanied by weight loss of 22lbs over the past several monthswith progressive worsening of dyspnea since onset. He visited the [facility] where a CT revealed a large right sided pleural effusion and massapproximate 1 week prior to this admission. He opted out of a medically advised admission. Upon presentation here he was noted to be stable of room air but symptomatically uncomfortable and required admission due to the pleural effusion.Thoracentesis was performed which revealed a bloody aspirate concerning for an exudative process,  most likely malignant versus TB.His [name] was repeated, results are concerning for malignancy but are insufficient to begin treatment without further evaluation.  As such bronchoscopy has been scheduled for Monday and he will need a Pleurx catheter placed to the rapidly accumulating nature of the fluid.  Plan: Right sided pulmonary mass and pleural effusion: As above the patient's is scheduled for bronchoscopy and has been referred to oncology.  We are holding the patient's Plavix dose procedures are completed. --Postthoracentesis CXRwithout pneumo but indicating slight increase in the pleural effusion today --Cytologyreportindicating presence of malignant cells with further evaluation to come - pleural fluid cultures with no growth x3 days, gram stain with WBCs no organisms  --Continue to monitor O2 sats and maintain above 88%, no O2 requirement at this time  - PRN duoneb   AKIon CKD stage III: AKI resolved: Will continue to monitor his renal function. 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 Began 8 weeks prior. Patient had begun self treating with probiotics due to a history of C diff in the past. He denied recent antibiotic use and has not clear signs or symptoms consistent with a C diff infection aside from partially formed loose watery stool. No clear indication that this is carcinoid syndrome at this time given his lack of additional symptoms. --Stool WBC, Fat and Osmo ordered -  Fecal lactoferrin neg  - fecal fat and osm in process   HTN: - low normal tensive / hypotensive this admission  - continue to hold home antihypertensives     Dispo: Anticipated discharge in approximately 1-2 day(s).   Ledell Noss, MD 04/06/2018, 7:38 AM Pager: [phone]

## 2018-04-06 NOTE — Progress Notes (Signed)
Patient was noted to have a decreased affect today, and was tearful during conversation on phone with family.  He does not want the mention of "cancer" in front of his family at this time and no mention of this was made when his daughter arrived this afternoon.  He had some tachycardia around 1400 and Dr. Shan Levans was paged and notified.  Pt had general complaints of malaise and fatigue and BP was 97/65, 108 Apical, 97.7 temp.  Patient denies any shortness of breath or chest pain. An Xray was ordered and performed. Patient is NPO after midnight for bronchoscopy tomorrow.  Emotional support and reassurance provided.

## 2018-04-06 NOTE — Progress Notes (Signed)
  Date: 04/06/2018  Patient name: Christopher Wilson  Medical record number: [id number]  Date of birth: [birth year]   This patient's plan of care was discussed with the house staff. Please see their note for complete details. I concur with their findings.   Sid Falcon, MD 04/06/2018, 7:07 PM

## 2018-04-07 ENCOUNTER — Inpatient Hospital Stay (HOSPITAL_COMMUNITY): Payer: Medicare HMO

## 2018-04-07 ENCOUNTER — Encounter (HOSPITAL_COMMUNITY)
Admission: EM | Disposition: A | Discharge status: Home Health | Payer: Self-pay | Source: Home / Self Care | Attending: Oncology

## 2018-04-07 HISTORY — PX: VIDEO BRONCHOSCOPY: SHX5072

## 2018-04-07 LAB — FECAL FAT, QUALITATIVE
Fat Qual Neutral, Stl: NORMAL
Fat Qual Total, Stl: NORMAL

## 2018-04-07 LAB — BASIC METABOLIC PANEL
Anion gap: 10 (ref 5–15)
BUN: 27 mg/dL — ABNORMAL HIGH (ref 6–20)
CO2: 21 mmol/L — ABNORMAL LOW (ref 22–32)
Calcium: 9 mg/dL (ref 8.9–10.3)
Chloride: 108 mmol/L (ref 101–111)
Creatinine, Ser: 1.88 mg/dL — ABNORMAL HIGH (ref 0.61–1.24)
GFR calc Af Amer: 38 mL/min — ABNORMAL LOW (ref >60)
GFR calc non Af Amer: 33 mL/min — ABNORMAL LOW (ref >60)
Glucose, Bld: 116 mg/dL — ABNORMAL HIGH (ref 65–99)
Potassium: 5 mmol/L (ref 3.5–5.1)
Sodium: 139 mmol/L (ref 135–145)

## 2018-04-07 LAB — CULTURE, BODY FLUID W GRAM STAIN -BOTTLE

## 2018-04-07 LAB — CULTURE, BODY FLUID-BOTTLE: CULTURE: NO GROWTH

## 2018-04-07 SURGERY — VIDEO BRONCHOSCOPY WITHOUT FLUORO
Anesthesia: Moderate Sedation | Laterality: Bilateral

## 2018-04-07 MED ORDER — LIDOCAINE HCL 2 % EX GEL: 1.0000 "application " | Freq: Once | CUTANEOUS | Status: DC

## 2018-04-07 MED ORDER — MIDAZOLAM HCL 5 MG/ML IJ SOLN
INTRAMUSCULAR | Status: AC
  Filled 2018-04-07: qty 2

## 2018-04-07 MED ORDER — TRAMADOL HCL 50 MG PO TABS
50.0000 mg | ORAL_TABLET | Freq: Once | ORAL | Status: AC
  Administered 2018-04-07: 50 mg via ORAL
  Filled 2018-04-07: qty 1

## 2018-04-07 MED ORDER — PHENYLEPHRINE HCL 0.25 % NA SOLN
NASAL | Status: DC | PRN
  Administered 2018-04-07: 2 via NASAL

## 2018-04-07 MED ORDER — SODIUM CHLORIDE 0.9 % IV SOLN
INTRAVENOUS | Status: DC
  Administered 2018-04-07: 09:00:00 via INTRAVENOUS

## 2018-04-07 MED ORDER — FENTANYL CITRATE (PF) 100 MCG/2ML IJ SOLN
INTRAMUSCULAR | Status: DC | PRN
  Administered 2018-04-07: 50 ug via INTRAVENOUS
  Administered 2018-04-07 (×2): 25 ug via INTRAVENOUS

## 2018-04-07 MED ORDER — FENTANYL CITRATE (PF) 100 MCG/2ML IJ SOLN
INTRAMUSCULAR | Status: AC
  Filled 2018-04-07: qty 4

## 2018-04-07 MED ORDER — LIDOCAINE HCL URETHRAL/MUCOSAL 2 % EX GEL
CUTANEOUS | Status: DC | PRN
  Administered 2018-04-07: 1

## 2018-04-07 MED ORDER — MIDAZOLAM HCL 10 MG/2ML IJ SOLN
INTRAMUSCULAR | Status: DC | PRN
  Administered 2018-04-07 (×3): 1 mg via INTRAVENOUS

## 2018-04-07 MED ORDER — PHENYLEPHRINE HCL 0.25 % NA SOLN: 1.0000 | Freq: Four times a day (QID) | NASAL | Status: DC | PRN

## 2018-04-07 MED ORDER — LIDOCAINE HCL (PF) 1 % IJ SOLN
INTRAMUSCULAR | Status: DC | PRN
  Administered 2018-04-07: 6 mL

## 2018-04-07 NOTE — Progress Notes (Signed)
Subjective: The patient was resting in his bed today upon entering the room. He was sleeping but able to briefly awaken to inform us that he was without pain and resting well. He is in agreement with waiting for the drain placement as he again feels dyspneic.   Objective:  Vital signs in last 24 hours: Vitals:   04/07/18 1005 04/07/18 1010 04/07/18 1033 04/07/18 1603  BP: 110/64 117/62 114/71 (!) 99/48  Pulse: 86 85 85 89  Resp: (!) 24 (!) 24 (!) 28 (!) 22  Temp:   97.6 F (36.4 C) (!) 97.5 F (36.4 C)  TempSrc:   Oral Oral  SpO2: 92% 92% 96% 94%  Weight:      Height:       Physical Exam  Constitutional: He appears well-developed and well-nourished. No distress.  HENT:  Head: Normocephalic and atraumatic.  Eyes: Conjunctivae and EOM are normal.  Neck: Normal range of motion.  Cardiovascular: Normal rate and regular rhythm.  No murmur heard. Pulmonary/Chest: Effort normal. No stridor. No respiratory distress. He has decreased breath sounds in the right middle field and the right lower field. He has wheezes.  Abdominal: Soft. Bowel sounds are normal. He exhibits no distension.  Musculoskeletal: He exhibits no edema.  Neurological: He is alert.  Skin: Skin is warm. He is not diaphoretic.  Psychiatric: He has a normal mood and affect.   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 This was accompanied by weight loss of 22lbs over the past several monthswith  progressive worsening with weight loss and dyspnea since onset. He visited the [facility] where a CT revealed a large right sided pleural effusion and massapproximate 1 week prior to this admission. He opted out of a medically advised admission. Upon presentation here he was noted to be stable of room air but symptomatically uncomfortable and required admission due to the pleural effusion.Thoracentesis was performed which revealed a bloody aspirate concerning for an exudative process, most likely malignant versus TB.His [name] was repeated, results are concerning for malignancy but are insufficient to begin treatment without further evaluation.  As such bronchoscopy was performed on 06/10 and he is scheduled for a Pleurx catheter placement for the rapidly reaccumulating fluid.He will need to remain hospitalized until the catheter is placed as he is not currently stable for discharge.   Plan: Right sided pulmonary mass and pleural effusion: As above the patient's procedure is scheduled for bronchoscopy and has been referred to oncology.  We are holding the patient's Plavix dose procedures are completed. --Postthoracentesis CXRwithout pneumo but indicating slight increase in the pleural effusion today --Cytologyreportindicating presence of malignant cells with further evaluation to come --Bronchoscopy performed by Dr. Elsworth Soho with Pulmonary today,      ---Labs sent AF smear/cx, Fungal, cytology collected --Continue to monitor O2 sats and maintain above 88%  AKIon CKD stage III: AKI resolved: We will continue to monitor  his renal function.Cr1.88improved from2.53 on admission.Potassium was slightly elevated without other significant abnormalities. 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 Began 8 weeks prior. Patient had begun self treating with probiotics due to a history of C diff in the past. He denied recent antibiotic use and has not clear signs or symptoms consistent  with a C diff infection aside from partially formed loose watery stool. WBC count 6.0 the prior day, afebrile, vitals stable, patient appears euvolemic. No clear indication that this is carcinoid syndrome at this time given his lack of additional symptoms. --Stool WBC, Fat and Osmo ordered --Stool WBC negative  HTN: Hold antihypertensives due to normotension. Most recent recorded BP concerning for mild hypotension which appears to be intermittent and asymptomatic.Will continue to monitor  Diet: Regular Code: Full Fluids: n/a DVT PPX: Heparin due to renal disease Dispo: Anticipated discharge in approximately 1-2 day(s).   Kathi Ludwig, MD 04/07/2018, 4:26 PM Pager: Pager# [phone]

## 2018-04-07 NOTE — Progress Notes (Signed)
Video bronchoscopy performed Intervention bronchial washings Intervention bronchial brushings Intervention bronchial biopsies Pt tolerated well  Latham Kinzler David RRT  

## 2018-04-07 NOTE — Procedures (Signed)
Bronchoscopy Procedure Note Christopher Wilson [id number] 04-28-1941  Procedure: Bronchoscopy Indications: Obtain specimens for culture and/or other diagnostic studies  Procedure Details Consent: Risks of procedure as well as the alternatives and risks of each were explained to the (patient/caregiver).  Consent for procedure obtained. Time Out: Verified patient identification, verified procedure, site/side was marked, verified correct patient position, special equipment/implants available, medications/allergies/relevent history reviewed, required imaging and test results available.  Performed Written informed consent was obtained from the patient prior to the procedure. The risks of the procedure including coughing, bleeding and a small chance of lung cancer requiring a chest tube were discussed with the patient in great detail and evidenced understanding.  3 mg of Versed and  19mg of fentanyl were used in divided doses during the procedure. Bronchoscope was inserted from the right Nare. The upper airway appeared normal. Vocal cord showed normal appearance in motion. The trachea bronchial tree was then examined to the subsegmental level. Moderate clear mucoid  secretions were noted. Endobronchial lesion noted blocking RML take off. RLL was narrowed/ extrinsically compressed but could be traversed  Attention was then turned to the right middle lobe. Bronchoalveolar lavage was obtained from the right middle  lobe with good return. Endobronchial biopsies x5 were obtained from the different subsegments of the right middle lobe. The patient tolerated procedure well with minimal bleeding.   He was awake and alert in the end of the procedure although ETCO2 was higher at 48    Evaluation Hemodynamic Status: BP stable throughout; O2 sats: stable throughout Patient's Current Condition: stable Specimens:  Sent serosanguinous fluid -BAL Bronchial brushings Endobronchial Bx for path  Complications: No  apparent complications Patient did tolerate procedure well.   RLeanna SatoAElsworth SohoMD 04/07/2018

## 2018-04-07 NOTE — Progress Notes (Signed)
Pt complaining of 8 out of 10 pain. Paged Dr Elsworth Soho, per order from Dr Elsworth Soho, give pt Tramodol.

## 2018-04-07 NOTE — Progress Notes (Signed)
Name: Christopher Wilson MRN: [id number] DOB: 26-Dec-1940    ADMISSION DATE:  03/31/2018 CONSULTATION DATE:  6/3  REFERRING MD :  EDP  CHIEF COMPLAINT:  Lung mass, pleural effusion   BRIEF PATIENT DESCRIPTION: 77yo male smoker with extensive cardiac hx including CAD with CABG x2 ('89 and redo 2002), multiple stents, CKD, hx HTN presented 6/3 with several weeks hx worsening fatigue, dyspnea, weakness, weight loss.  Was seen 5/27 at [place] with large lung mass noted as well as AKI.  He declined admission at that time and returned to [facility] 6/3 with same s/s.   SIGNIFICANT EVENTS  Thoracentesis 6/3, and again 6/5   STUDIES:  CT chest 5/27>>> Large right perihilar mass versus round pneumonia. The complete interruption of the bronchus intermedius favors malignancy. Postobstructive atelectasis of the right middle and right lower lobes.   Interstitial thickening in the right upper lobe may represent lymphangitic spread of disease or an element of edema.  Large loculated right pleural effusion.  Right apical 14 mm secondary nodule versus scarring.  Underlying upper lobe predominant paraseptal emphysema.    SUBJECTIVE:  Appears depressed Denies CP, dyspnea   VITAL SIGNS: Temp:  [97.7 F (36.5 C)-98.3 F (36.8 C)] 97.8 F (36.6 C) (06/10 0832) Pulse Rate:  [88-108] 88 (06/10 0850) Resp:  [14-23] 21 (06/10 0850) BP: (79-150)/(60-94) 108/83 (06/10 0850) SpO2:  [93 %-97 %] 94 % (06/10 0850)  PHYSICAL EXAMINATION: Gen. Pleasant, well-nourished, in no distress ENT - no thrush, no post nasal drip Neck: No JVD, no thyromegaly, no carotid bruits Lungs: no use of accessory muscles, no dullness to percussion, decreased on right without rales or rhonchi  Cardiovascular: Rhythm regular, heart sounds  normal, no murmurs or gallops, no peripheral edema Musculoskeletal: No deformities, no cyanosis or clubbing    Recent Labs  Lab 04/04/18 0340 04/06/18 0235 04/07/18 0227  NA 140  140 139  K 5.2* 5.1 5.0  CL 111 109 108  CO2 24 24 21*  BUN 26* 24* 27*  CREATININE 1.83* 1.76* 1.88*  GLUCOSE 102* 111* 116*   Recent Labs  Lab 04/01/18 0256 04/03/18 0549  HGB 10.6* 10.6*  HCT 33.6* 33.7*  WBC 5.7 6.1  PLT 250 262   Dg Chest 2 View  Result Date: 04/06/2018 CLINICAL DATA:  Pleural effusion EXAM: CHEST - 2 VIEW COMPARISON:  04/05/2018 FINDINGS: Enlargement of cardiac silhouette post CABG. Coronary stents noted. Atherosclerotic calcification aorta. Mediastinal contours and pulmonary vascularity normal. Large RIGHT pleural effusion with significant atelectasis in the mid to lower RIGHT lung. LEFT lung clear. No pneumothorax. Bones unremarkable. IMPRESSION: Large RIGHT pleural effusion with significant atelectasis of the mid to lower RIGHT lung. Enlargement of cardiac silhouette post CABG and coronary PTCA/stenting. Electronically Signed   By: Lavonia Dana M.D.   On: 04/06/2018 16:13   Cultures Pleural Fluid 6/3>> No growth 3 days Pleural Fluid 6/5>> GS: WBC present, PMN and Mononuclear, no organisms  Pathology 6/3: Pleural Fluid Rare Atypical Cells  6/5: Pleural Fluid >> malignant cells   6/3: Pleural Fluid Analysis Protein>> 3.7 g/dL LDH>> 558 U/L ( Serum LDH 205) Cholesterol>> 68 PH>> WBC>> 461 Neutrophils>> 5% Lymphs >> 46% Monocytes>> 45% Eos >> 4%  6/5 Pleural Fluid Analysis LDH:656 Protein 3.4 Cholesterol WBC>> 935 Neutrophils>> 16% Lymphs>> 65% MOno>> 16% Eos> 3%     ASSESSMENT / PLAN:  77 year old male smoker with an extensive cardiac history presenting with wt loss, SOB, DOE and pleural effusion 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 The benefits & alternatives including serial follow up were also discussed.  Kara Mead MD. Shade Flood. [facility] Pager [phone] If no response call [phone]     04/07/2018 9:31 AM

## 2018-04-07 NOTE — Progress Notes (Signed)
Medicine attending: I examined this patient today together with resident physician Dr. Kathi Ludwig and I concur with his evaluation and management plan. Patient had a bronchoscopy with biopsy this morning with no immediate complications.  An obstructing lesion was found in the right middle lobe bronchus.  Pathology pending. Decreased breath sounds anterior right chest compared with the left.  Patient was supine.  Still sedated from the procedure.  Known large right pleural effusion.  Post bronchoscopy chest radiograph done.  No pneumothorax. He is scheduled to have a pleural drainage catheter placed tomorrow. His case will be presented at the multidisciplinary thoracic oncology conference this Thursday at [facility].  Referral made to medical oncology, Dr. Julien Nordmann.  Final arrangements will be made at time of hospital discharge.

## 2018-04-08 ENCOUNTER — Encounter (HOSPITAL_COMMUNITY): Payer: Self-pay | Admitting: Pulmonary Disease

## 2018-04-08 ENCOUNTER — Inpatient Hospital Stay (HOSPITAL_COMMUNITY): Payer: Medicare HMO

## 2018-04-08 DIAGNOSIS — R918 Other nonspecific abnormal finding of lung field: Secondary | ICD-10-CM

## 2018-04-08 DIAGNOSIS — C7A1 Malignant poorly differentiated neuroendocrine tumors: Secondary | ICD-10-CM | POA: Insufficient documentation

## 2018-04-08 DIAGNOSIS — C3491 Malignant neoplasm of unspecified part of right bronchus or lung: Secondary | ICD-10-CM

## 2018-04-08 DIAGNOSIS — R0902 Hypoxemia: Secondary | ICD-10-CM

## 2018-04-08 DIAGNOSIS — C3490 Malignant neoplasm of unspecified part of unspecified bronchus or lung: Secondary | ICD-10-CM

## 2018-04-08 HISTORY — PX: IR GUIDED DRAIN W CATHETER PLACEMENT: IMG719

## 2018-04-08 LAB — PROTIME-INR
INR: 1.06
Prothrombin Time: 13.8 seconds (ref 11.4–15.2)

## 2018-04-08 LAB — ACID FAST SMEAR (AFB, MYCOBACTERIA): Acid Fast Smear: NEGATIVE

## 2018-04-08 LAB — SURGICAL PCR SCREEN
MRSA, PCR: NEGATIVE
STAPHYLOCOCCUS AUREUS: NEGATIVE

## 2018-04-08 LAB — OSMOLALITY, STOOL

## 2018-04-08 MED ORDER — MUPIROCIN 2 % EX OINT
1.0000 "application " | TOPICAL_OINTMENT | Freq: Two times a day (BID) | CUTANEOUS | Status: DC
  Administered 2018-04-08 – 2018-04-09 (×3): 1 via NASAL
  Filled 2018-04-08: qty 22

## 2018-04-08 MED ORDER — MIDAZOLAM HCL 2 MG/2ML IJ SOLN
INTRAMUSCULAR | Status: AC | PRN
  Administered 2018-04-08 (×2): 1 mg via INTRAVENOUS

## 2018-04-08 MED ORDER — FENTANYL CITRATE (PF) 100 MCG/2ML IJ SOLN
INTRAMUSCULAR | Status: AC | PRN
  Administered 2018-04-08 (×2): 25 ug via INTRAVENOUS
  Administered 2018-04-08: 50 ug via INTRAVENOUS

## 2018-04-08 MED ORDER — CEFAZOLIN SODIUM-DEXTROSE 2-4 GM/100ML-% IV SOLN
2.0000 g | INTRAVENOUS | Status: AC
Start: 2018-04-08 — End: 2018-04-08
  Administered 2018-04-08: 2 g via INTRAVENOUS
  Filled 2018-04-08: qty 100

## 2018-04-08 MED ORDER — LIDOCAINE HCL (PF) 1 % IJ SOLN
INTRAMUSCULAR | Status: AC | PRN
  Administered 2018-04-08: 2 mL

## 2018-04-08 MED ORDER — MIDAZOLAM HCL 2 MG/2ML IJ SOLN
INTRAMUSCULAR | Status: AC
  Filled 2018-04-08: qty 4

## 2018-04-08 MED ORDER — FENTANYL CITRATE (PF) 100 MCG/2ML IJ SOLN
INTRAMUSCULAR | Status: AC
  Filled 2018-04-08: qty 4

## 2018-04-08 MED ORDER — SODIUM CHLORIDE 0.9 % IV BOLUS
500.0000 mL | Freq: Once | INTRAVENOUS | Status: AC
  Administered 2018-04-08: 500 mL via INTRAVENOUS

## 2018-04-08 MED ORDER — LIDOCAINE HCL 1 % IJ SOLN
INTRAMUSCULAR | Status: AC
  Filled 2018-04-08: qty 20

## 2018-04-08 NOTE — Progress Notes (Signed)
Subjective: The patient was resting in his bed today upon entering the room. He was fully aware of the plan to undergo a pleural drain placement today prior to a potential discharge later today if amenable and physically able.   Objective:  Vital signs in last 24 hours: Vitals:   04/07/18 1010 04/07/18 1033 04/07/18 1603 04/07/18 2313  BP: 117/62 114/71 (!) 99/48 104/62  Pulse: 85 85 89 85  Resp: (!) 24 (!) 28 (!) 22 20  Temp:  97.6 F (36.4 C) (!) 97.5 F (36.4 C) 97.8 F (36.6 C)  TempSrc:  Oral Oral Oral  SpO2: 92% 96% 94% 94%  Weight:      Height:       Physical Exam  Constitutional: He appears well-developed and well-nourished. No distress.  Eyes: Conjunctivae are normal.  Cardiovascular: Normal rate and regular rhythm.  No murmur heard. Pulmonary/Chest: Effort normal. No stridor. No respiratory distress. He has decreased breath sounds in the right middle field and the right lower field.  Abdominal: Soft. Bowel sounds are normal.  Musculoskeletal: He exhibits no edema.  Skin: Skin is warm. He is not diaphoretic.    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 This was accompanied by weight loss of 22lbs over the past several monthswith progressive worsening with weight loss and dyspnea since onset. He visited the [facility] where a CT revealed a large right sided pleural effusion and massapproximate 1 week prior to this admission. He  opted out of a medically advised admission. Upon presentation here he was noted to be stable of room air but symptomatically uncomfortable and required admission due to the pleural effusion.Thoracentesis was performed which revealed a bloody aspirate concerning for an exudative process, most likely malignant versus TB.His [name] was repeated, results are concerning for malignancy but are insufficient to begin treatment without further evaluation. As such bronchoscopy was performed on 06/10 and he is scheduled for a Pleurx catheter placement for the rapidly reaccumulating fluid.He will need to remain hospitalized until the catheter is placed as he is not currently stable for discharge.   Plan: Right sided pulmonary mass and pleural effusion: As above the patient underwent bronchoscopy and has been referred to oncology. We are holding the patient's Plavix until his procedures are compelted. --Post bronchoscopy CXRwithout pneumo, effusion is stable --Cytologyreportindicating presence of malignant cells with further evaluation to come --Bronchoscopy performed by Dr. Elsworth Soho with Pulmonary today,      ---Labs sent AF smear/cx, Fungal, cytology collected --Continue to monitor O2 sats and maintain above 88% --Slated for pleural drain placement with IR today.   AKIon CKD stage III: AKI resolved: We will continue to monitor his renal function.Cr1.88improved from2.53 on admission.Potassium was slightly elevated without other significant abnormalities. --BMPevery other day   Diarrhea: Chronic in nature. Began 8 weeks prior. Patient had begun self treating with probiotics due to a history of C  diff in the past. He denied recent antibiotic use and has not clear signs or symptoms consistent with a C diff infection aside from partially formed loose watery stool. WBC count 6.0 the prior day, afebrile, vitals stable, patient appears euvolemic. No clear indication that this is carcinoid syndrome at  this time given his lack of additional symptoms. 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   Kathi Ludwig, MD 04/08/2018, 6:57 AM Pager: Pager# [phone]

## 2018-04-08 NOTE — Procedures (Signed)
Interventional Radiology Procedure Note  Procedure: Placement of a right tunneled pleur-X catheter.   Complications: None  Estimated Blood Loss: None  Recommendations: - Daily use  Signed,  Criselda Peaches, MD

## 2018-04-08 NOTE — Telephone Encounter (Signed)
Patient has a new patient appointment already sechd for New Patient appointment

## 2018-04-08 NOTE — Progress Notes (Signed)
Medicine attending: I examined this patient today together with resident physician Dr. Kathi Ludwig and I concur with his evaluation and management plan. Right-sided Pleurx catheter placed without complication by interventional radiology. 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 Results discussed with the patient and his daughter. We reviewed that this variation of lung cancer is highly sensitive to chemotherapy which also provides the best palliation although it is unlikely to be curative.  We will get a noncontrast MRI of his brain.  CT chest showed almost his entire liver and there were no gross abnormalities of the liver or adrenal glands on this noncontrast study.  No liver function abnormalities on blood chemistries. Main handicap to his treatment will be the fact that he has only one kidney and that kidney has compromised function. We will involve medical oncology at this time.  He may require his first treatment in the hospital.  If this is the case, then we will transfer to [facility] long hospital.

## 2018-04-08 NOTE — Progress Notes (Signed)
LATE ENTRY for 04/07/18  Nutrition Follow Up  DOCUMENTATION CODES:   Not applicable  INTERVENTION:    Ensure Enlive po BID, each supplement provides 350 kcal and 20 grams of protein  NUTRITION DIAGNOSIS:   Increased nutrient needs related to chronic illness as evidenced by estimated needs, ongoing  GOAL:   Patient will meet greater than or equal to 90% of their needs, progressing  MONITOR:   PO intake, Supplement acceptance, Labs, Skin, Weight trends, I & O's  REASON FOR ASSESSMENT:   Malnutrition Screening Tool  ASSESSMENT:   77 yo Male who is a smoker with extensive cardiac hx including CAD with CABG x2 ('89 and redo 2002), multiple stents, CKD, hx HTN presented 6/3 with several weeks hx worsening fatigue, dyspnea, weakness, weight loss.  Was seen 5/27 at [facility] with large lung mass noted as well as AKI.  Pt s/p procedure 6/10:  BRONCHOSCOPY   Pt reports he's currently not eating well. He's having chest pain and is uncomfortable. Mentioned getting a chest tube inserted 6/11 for fluid removal.  He is drinking some of his Ensure Enlive supplements. RD encouraged him to drink if he can't eat or finish his meal. Labs and medications reviewed.  Unable to complete Nutrition-Focused physical exam at this time.  Pt in significant pain.  Diet Order:   Diet Order           Diet NPO time specified Except for: Sips with Meds  Diet effective midnight         EDUCATION NEEDS:   No education needs have been identified at this time  Skin:  Skin Assessment: Reviewed RN Assessment  Last BM:  6/10  Height:   Ht Readings from Last 1 Encounters:  03/31/18 5\' 10"  (1.778 m)   Weight:   Wt Readings from Last 1 Encounters:  03/31/18 157 lb 6.5 oz (71.4 kg)   BMI:  Body mass index is 22.59 kg/m.  Estimated Nutritional Needs:   Kcal:  1800-2000  Protein:  90-105 gm  Fluid:  1.8-2.0 L  Arthur Holms, RD, LDN Pager #: [phone] After-Hours Pager #:  [phone]

## 2018-04-08 NOTE — Progress Notes (Signed)
Name: Christopher Wilson MRN: [id number] DOB: Mar 19, 1941    ADMISSION DATE:  03/31/2018 CONSULTATION DATE:  6/3  REFERRING MD :  EDP  CHIEF COMPLAINT:  Lung mass, pleural effusion   BRIEF PATIENT DESCRIPTION: 77yo male smoker with extensive cardiac hx including CAD with CABG x2 ('89 and redo 2002), multiple stents, CKD, hx HTN presented 6/3 with several weeks hx worsening fatigue, dyspnea, weakness, weight loss.  Was seen 5/27 at [facility] with large lung mass noted as well as AKI.  He declined admission at that time and returned to [facility] 6/3 with same s/s.   SIGNIFICANT EVENTS  Thoracentesis 6/3, and again 6/5   STUDIES:  CT chest 5/27>>> Large right perihilar mass versus round pneumonia. The complete interruption of the bronchus intermedius favors malignancy. Postobstructive atelectasis of the right middle and right lower lobes.   Interstitial thickening in the right upper lobe may represent lymphangitic spread of disease or an element of edema.  Large loculated right pleural effusion.  Right apical 14 mm secondary nodule versus scarring.  Underlying upper lobe predominant paraseptal emphysema.  SUBJECTIVE:  No events overnight, understandably depressed and afraid  VITAL SIGNS: Temp:  [97.7 F (36.5 C)-98.1 F (36.7 C)] 97.9 F (36.6 C) (06/11 1619) Pulse Rate:  [82-94] 82 (06/11 1619) Resp:  [16-25] 16 (06/11 1619) BP: (83-129)/(44-82) 89/44 (06/11 1619) SpO2:  [94 %-99 %] 95 % (06/11 1619)  PHYSICAL EXAMINATION: Gen: depressed affect, tired, well appearing however, NAD ENT - [place]/AT, PERRL, EOM-I and MMM Neck: Supple Lungs: Decreased BS on the right, clear on the left Cardiovascular: RRR, Nl S1/S2 and -M/R/G Abdomen: soft, NT, ND and +BS Musculoskeletal: No deformities, no cyanosis or clubbing  Ext: -edema and -tenderness Skin: intact aside from some bruising  Recent Labs  Lab 04/04/18 0340 04/06/18 0235 04/07/18 0227  NA 140 140 139  K 5.2* 5.1 5.0  CL  111 109 108  CO2 24 24 21*  BUN 26* 24* 27*  CREATININE 1.83* 1.76* 1.88*  GLUCOSE 102* 111* 116*   Recent Labs  Lab 04/03/18 0549  HGB 10.6*  HCT 33.7*  WBC 6.1  PLT 262   Ir Guided Drain W Catheter Placement  Result Date: 04/08/2018 INDICATION: 77 year old male with a malignant right-sided pleural effusion. He presents for tunneled pleural drainage catheter placement EXAM: Pleural tunneled drainage catheter placement COMPARISON:  CT scan of the chest 03/24/2018 MEDICATIONS: 2 g Ancef ANESTHESIA/SEDATION: The patient was continuously monitored during the procedure by the interventional radiology nurse under my direct supervision. Fentanyl 100 mcg IV; Versed 2 mg IV Moderate Sedation Time:  28 minutes COMPLICATIONS: None immediate. PROCEDURE: Informed written consent was obtained from the patient after a thorough discussion of the procedural risks, benefits and alternatives. All questions were addressed. Maximal Sterile Barrier Technique was utilized including caps, mask, sterile gowns, sterile gloves, sterile drape, hand hygiene and skin antiseptic. A timeout was performed prior to the initiation of the procedure. The right chest was interrogated with ultrasound. There is a large right-sided pleural effusion. A suitable skin entry site was selected and marked. Local anesthesia was attained by infiltration with 1% lidocaine. A small dermatotomy was made. A skin exit site was selected 5 cm superior and medial to the planned pleural entry site. Local anesthesia was again attained by infiltration with 1% lidocaine. A second small dermatotomy was made. The subcutaneous tract between the 2 incisions was then anesthetized. An 18 gauge sheath needle was advanced through the dermatotomy at the  pleural entry site and into the pleural fluid. The needle portion was removed. A 0.035 wire was advanced into the pleural space. The tunneled PleurX catheter was then tunneled from the skin exit site to the dermatotomy  overlying the pleural access site. A peel-away sheath was advanced over the wire and into the pleural space. The PleurX catheter was then fed through the sheath and into the pleural space. Aspiration was performed yielding 2 L of bloody pleural fluid. The catheter was then secured to the skin with 0 Prolene suture. The dermatotomy at the pleural access site was closed with a single inverted interrupted 3 0 Vicryl suture in the epidermis sealed with Dermabond. IMPRESSION: Successful placement of a right-sided tunneled pleural drainage catheter with removal of 2 L of bloody pleural fluid. Electronically Signed   By: Jacqulynn Cadet M.D.   On: 04/08/2018 14:07   Dg Chest Port 1 View  Result Date: 04/07/2018 CLINICAL DATA:  Pleural effusion.  Status post bronchoscopy. EXAM: PORTABLE CHEST 1 VIEW COMPARISON:  Chest x-rays dated 04/06/2018 in 04/05/2018. FINDINGS: Large opacity of the RIGHT hemithorax is unchanged, corresponding to the large pleural effusion demonstrated on chest CT of 03/24/2018. LEFT lung is clear. No pneumothorax seen. Cardiomediastinal silhouette is grossly stable. Median sternotomy wires in place. No acute or suspicious osseous finding. IMPRESSION: Large RIGHT pleural effusion is stable. No procedural complicating feature identified, status post bronchoscopy. Electronically Signed   By: Franki Cabot M.D.   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  Discussed with PCCM-NP.  Pleural effusion:  - Pleurex catheter via IR given rate of accumulation  Lung mass/SCLC:  - Oncology consult  Hypoxemia:  - Titrate O2 for sat of 88-92%  PCCM will sign off, please call back if needed.  Rush Farmer, M.D. [facility] Pulmonary/Critical Care Medicine. Pager: [phone]. After hours pager: [phone].  04/08/2018 5:10 PM

## 2018-04-08 NOTE — Plan of Care (Signed)
  Problem: Education: Goal: Knowledge of General Education information will improve Outcome: Progressing  Pt updated on POC. Pt had his bronchoscopy today and knows the plan for tomorrow is to have a pleurx catheter placed for his recurrent pleural effusions. Pt stated he had relief from pain medication given earlier today. Pt is aware and knows that he is to be NPO after midnight. Pt given opportunity to ask questions about his POC, pt had no questions.

## 2018-04-09 ENCOUNTER — Other Ambulatory Visit: Payer: Self-pay | Admitting: Medical Oncology

## 2018-04-09 ENCOUNTER — Telehealth: Payer: Self-pay | Admitting: Internal Medicine

## 2018-04-09 DIAGNOSIS — Z978 Presence of other specified devices: Secondary | ICD-10-CM

## 2018-04-09 DIAGNOSIS — C342 Malignant neoplasm of middle lobe, bronchus or lung: Principal | ICD-10-CM

## 2018-04-09 DIAGNOSIS — R197 Diarrhea, unspecified: Secondary | ICD-10-CM

## 2018-04-09 DIAGNOSIS — C3490 Malignant neoplasm of unspecified part of unspecified bronchus or lung: Secondary | ICD-10-CM

## 2018-04-09 LAB — CULTURE, BAL-QUANTITATIVE W GRAM STAIN: Culture: 20000 — AB

## 2018-04-09 LAB — CULTURE, BAL-QUANTITATIVE: GRAM STAIN: NONE SEEN

## 2018-04-09 MED ORDER — SODIUM CHLORIDE 0.9 % IV BOLUS
1000.0000 mL | Freq: Once | INTRAVENOUS | Status: AC
  Administered 2018-04-09: 1000 mL via INTRAVENOUS

## 2018-04-09 NOTE — Care Management Note (Addendum)
Case Management Note  Patient Details  Name: Christopher Wilson MRN: [id number] Date of Birth: [birth year]  Subjective/Objective:   Pleural Effusion                Action/Plan: Patient lives at home alone and at discharge he is planning on staying with his daughter Christopher Wilson address  [address] [place] [address]  tele # [phone] He is also going home with PleurX; [place] choice offered, pt chose [place]; Christopher Wilson with [facility] called for arrangements; CM will continue to follow for progression of care. Paperwork completed for PleurX vac and Camera operator is ordering containers / kit for home. Christopher Wilson [facility]   Expected Discharge Date:  04/09/18               Expected Discharge Plan:  [place]  Discharge planning Services  CM Consult    HH Arranged:  RN, Nurse's Aide [facility] Agency:  [place]  Status of Service:  Completed, signed off  Sherrilyn Rist [phone] 04/09/2018, 11:50 AM

## 2018-04-09 NOTE — Telephone Encounter (Signed)
LVM on both phones to come to appointment with Dr Julien Nordmann tomorrow here at the cancer center, arrival time is 330, left call back number

## 2018-04-09 NOTE — Progress Notes (Signed)
10 Pleurex kits were ordered and given to patient.

## 2018-04-09 NOTE — Progress Notes (Signed)
Made MD aware that wound care does not deal with pleurex catheters and home health will have to have specific orders on how often to drain pt at home etc.  Pleurex kits ordered by secretary for pt to take home with him.  Home health is supposed to see pt tomorrow regarding pleurex catheter.

## 2018-04-09 NOTE — Progress Notes (Signed)
Visited with patient and daughter as he prepares for discharge.  Health concerns -Had prayer for peace and comfort during this time as he goes home with his daughter for now.  Conard Novak, Chaplain   04/09/18 1300  Clinical Encounter Type  Visited With Patient and family together  Visit Type Initial;Spiritual support;Other (Comment) (preparing for discharge)  Referral From Nurse  Consult/Referral To Chaplain  Spiritual Encounters  Spiritual Needs Prayer;Emotional  Stress Factors  Patient Stress Factors Health changes  Family Stress Factors None identified

## 2018-04-09 NOTE — Progress Notes (Signed)
Medicine attending discharge note: I personally examined this patient on the day of discharge and I attest to the accuracy of the discharge evaluation and plan as recorded in the final progress note which will be further detailed in the discharge summary by resident physician Dr. Kathi Ludwig.  77 year old man with coronary artery disease status post bypass surgery x2, chronic tobacco abuse, congenital absence of one kidney with compromise of his remaining left kidney, stage III renal insufficiency, who presented with increasing dyspnea, weakness, back pain, and unintentional weight loss and was found to have a large right pericardial effusion in association with an approximate 5 cm right hilar lung mass.  He had good symptomatic relief with thoracentesis x2.  Each time 1.5 L of hemorrhagic fluid was removed.  The first specimen was not diagnostic but the second specimen confirmed pulmonary malignancy.  Tissue insufficient for further study.  He therefore underwent bronchoscopy with biopsy on April 07, 2018.  He was found to have an obstructing lesion in the right middle lobe bronchus.  Biopsies showed high-grade neuroendocrine/small cell carcinoma.  No gross bone or liver lesions were seen on a noncontrast CT scan of the chest and upper abdomen.  No gross adrenal lesions.  Liver function tests were normal.  Serum LDH only mildly elevated at 205 on June 4.  Hemoglobin 11.  Platelet count 262,000.   MRI of the brain was ordered.This was not done prior to discharge. A pleural drainage catheter was placed on June 11. Cancer diagnosis and overview of treatment discussed at length with the patient and his daughter.  His performance status was excellent up until the time he became symptomatic from the cancer.  He would be a candidate for chemo/immunotherapy with appropriate dose adjustments for his renal function. Contact was made with medical oncology.  Patient was scheduled for a formal oncologic evaluation  24 hours post discharge.  Disposition: Condition stable at time of discharge. Follow-up with medical oncology at 4 PM on Thursday, June 13. Medical follow-up with his primary care physician Home nursing for pleural catheter management and education. There were no complications

## 2018-04-09 NOTE — Consult Note (Addendum)
[facility] Nurse wound consult note Reason for Consult:Clarification regarding Pleurex.  [facility] HH will educate patient regarding care of pleurex cathether. NO further WOC needs at this time. Bedside RN has notified case management for [facility] education.  Will not follow at this time.  Please re-consult if needed.  Domenic Moras RN BSN [place] Pager [phone]

## 2018-04-09 NOTE — Progress Notes (Signed)
Subjective: Patient was resting comfortably in his chair today upon entering the room.  He consumed breakfast without issue.  He continues to deny dyspnea at rest or with ambulation.  He has minor right-sided thoracic chest pain secondary to the placement of the drain.  Patient is in agreement with being discharged home today to follow-up with oncology tomorrow.  He is no acute complaints or concerns.  Objective:  Vital signs in last 24 hours: Vitals:   04/08/18 2300 04/08/18 2303 04/09/18 0052 04/09/18 0816  BP:   (!) 91/58 (!) 83/62  Pulse:   83 95  Resp:    16  Temp:   98.3 F (36.8 C) 98.2 F (36.8 C)  TempSrc:   Oral Oral  SpO2: (!) 86% 90% 92% (!) 88%  Weight:      Height:       Physical Exam  Constitutional: He appears well-developed and well-nourished. No distress.  HENT:  Head: Normocephalic and atraumatic.  Eyes: Conjunctivae are normal.  Cardiovascular: Normal rate and regular rhythm.  No murmur heard. Pulmonary/Chest: Effort normal. No stridor. No respiratory distress. He has decreased breath sounds in the right middle field and the right lower field.  Abdominal: Soft. Bowel sounds are normal. He exhibits no distension.  Musculoskeletal: He exhibits no edema.  Skin: Skin is warm. He is not diaphoretic.  Vitals reviewed.  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 This was accompanied by weight loss of 22lbs over the past several monthswith progressive worsening with weight loss and dyspnea since onset. He visited  the [facility] where a CT revealed a large right sided pleural effusion and massapproximate 1 week prior to this admission. He opted out of a medically advised admission. Upon presentation here he was noted to be stable of room air but symptomatically uncomfortable and required admission due to the pleural effusion.Thoracentesis was performed which revealed a bloody aspirate concerning for an exudative process, most likely malignant versus TB.His Christopher Wilson was repeated, results are concerning for malignancy but are insufficient to begin treatment without further evaluation. As such bronchoscopywas performed on 06/10indicating a diagnosis of small cell lung cancer for which she will follow-up with oncology at [facility].  Pleurx catheter placement was completed for therapidly reaccumulating fluid. The patient was determined to be stable for discharge with close follow-up with oncology by 04/09/2018.  Plan: Right sided pulmonary mass and pleural effusion: As above the patient underwent bronchoscopy and has been referred to oncology. We are holding the patient's Plavix  until the follows up with oncology. 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  No clear etiology.  Suspected to be likely secondary to probiotic use Imodium withdrawal.  HTN: Holding all antihypertensives due tohypotension.Most recent  recorded BP concerning for mild hypotension which appeared to be intermittent initially and has remained asymptomatic.  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   Kathi Ludwig, MD 04/09/2018, 10:39  AM Pager: Pager# [phone]

## 2018-04-09 NOTE — Discharge Summary (Signed)
Name: Christopher Wilson MRN: [id number] DOB: Nov 30, 1940 77 y.o. PCP: System, Pcp Not In  Date of Admission: 03/31/2018  8:12 AM Date of Discharge: 04/09/2018 04/09/2018 Attending Physician: Dr. Beryle Beams   Discharge Diagnosis: 1. Small Cell Lung Cancer 2. Pleural effusion 2/2 malignancy  Discharge Medications: Allergies as of 04/09/2018   No Known Allergies     Medication List    STOP taking these medications   amLODipine 5 MG tablet Commonly known as:  NORVASC   amoxicillin-clavulanate 875-125 MG tablet Commonly known as:  AUGMENTIN   azithromycin 250 MG tablet Commonly known as:  ZITHROMAX Z-PAK   clopidogrel 75 MG tablet Commonly known as:  PLAVIX   isosorbide mononitrate 60 MG 24 hr tablet Commonly known as:  IMDUR     TAKE these medications   albuterol 108 (90 Base) MCG/ACT inhaler Commonly known as:  PROVENTIL HFA;VENTOLIN HFA Inhale 2 puffs into the lungs every 6 (six) hours as needed for wheezing or shortness of breath.   aspirin 81 MG chewable tablet Chew 81 mg by mouth daily.   atorvastatin 80 MG tablet Commonly known as:  LIPITOR TAKE ONE TABLET BY MOUTH DAILY AT 6 P.M. NEEDS APPOINTMENT FOR FUTURE REFILLS.   metoprolol tartrate 25 MG tablet Commonly known as:  LOPRESSOR TAKE ONE TABLET BY MOUTH TWICE DAILY (PLEASE  CONTACT  OFFICE  FOR  ADDITIONAL  REFILLS  )   nitroGLYCERIN 0.4 MG SL tablet Commonly known as:  NITROSTAT Place 1 tablet (0.4 mg total) under the tongue every 5 (five) minutes as needed for chest pain.   PROBIOTIC-10 PO Take 1 tablet by mouth daily.      Disposition and follow-up:   Mr.Christopher Wilson was discharged from [facility] in Stable condition.  At the hospital follow up visit please address:  1.  Small Cell Lung Cancer: Will need further imaging to complete staging process. MRI WO contrast (due to renal impairment) of the brain ordered. See Cytology report on 04/07/2018.      CKD III: acute on chronic  renal injury on admission which improved with with fluids.      Hypotension: Held the patients antihypertensives due to low BP (80's sys) following his multiple thoracentesis with a total of 4.9L's removed including from the Pleurex catheter procedure.  2.  Labs / imaging needed at time of follow-up: MRI Brain, BMP for renal function  3.  Pending labs/ test needing follow-up: MRI brain WO contrast ordered  Follow-up Appointments: Follow-up Information    Shelda Pal, DO Follow up on 04/16/2018.   Specialty:  Family Medicine Why:  8:45 to be established as new patient.  if for some reason you can not come, please gie 24hr cancellation notice or you will be charged 50.00 which is not covered by insurance.  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 This was accompanied by weight loss of 22lbs over the past several monthswith progressive worsening weight loss and dyspnea since onset. He visited the [facility] ER 8 days prior to this admission where a CT revealed a large right sided pleural  effusion and mass. He opted out of a medically advised admission at that time and instead for antibiotic treatment of a possible pneumonia. Upon presentation the [facility] he was noted to be stable of room air but symptomatically uncomfortable and required admission due to the pleural effusion and dyspnea. A thoracentesis was performed (1.4L extracted) which revealed a bloody aspirate concerning for an exudative process, most likely malignant versus TB with notable improvement in his symptoms.His [name] was repeated (1.5L extracted), with results concerning for  malignancy but insufficient to definitively diagnose. As such a bronchoscopywas performed on 06/10indicating a diagnosis of small cell lung cancer for which he will follow-up with oncology at [facility].  Pleurx catheter placement was completed for therapidly reaccumulating fluid and the patient was educated on how often and how to use the drain as well as being supplied with the necessary equipment to begin. The patient was determined to be stable for discharge with close follow-up with oncology by 04/09/2018.  Discharge Vitals:   BP 103/69 (BP Location: Left Arm)   Pulse 90   Temp 98.3 F (36.8 C) (Oral)   Resp 16   Ht 5\' 10"  (1.778 m)   Wt 157 lb 6.5 oz (71.4 kg)   SpO2 97%   BMI 22.59 kg/m   Pertinent Labs, Studies, and Procedures:  CBC Latest Ref Rng & Units 04/03/2018 04/01/2018 03/31/2018  WBC 4.0 - 10.5 K/uL 6.1 5.7 6.7  Hemoglobin 13.0 - 17.0 g/dL 10.6(L) 10.6(L) 11.8(L)  Hematocrit 39.0 - 52.0 % 33.7(L) 33.6(L) 38.0(L)  Platelets 150 - 400 K/uL 262 250 285   CMP Latest Ref Rng & Units 04/07/2018 04/06/2018 04/04/2018  Glucose 65 - 99 mg/dL 116(H) 111(H) 102(H)  BUN 6 - 20 mg/dL 27(H) 24(H) 26(H)  Creatinine 0.61 - 1.24 mg/dL 1.88(H) 1.76(H) 1.83(H)  Sodium 135 - 145 mmol/L 139 140 140  Potassium 3.5 - 5.1 mmol/L 5.0 5.1 5.2(H)  Chloride 101 - 111 mmol/L 108 109 111  CO2 22 - 32 mmol/L 21(L) 24 24  Calcium 8.9 - 10.3 mg/dL 9.0 8.9 8.8(L)  Total Protein 6.5 - 8.1 g/dL - - -  Total Bilirubin 0.3 - 1.2 mg/dL - - -  Alkaline Phos 38 - 126 U/L - - -  AST 15 - 41 U/L - - -  ALT 17 - 63 U/L - - -   IR Guided Drain Placement: IMPRESSION: Successful placement of a right-sided tunneled pleural drainage catheter with removal of 2 L of bloody pleural fluid.  DG Chest 04/07/2018: IMPRESSION: Large RIGHT pleural effusion is stable. No procedural complicating feature identified, status post bronchoscopy.  Discharge Instructions: Discharge Instructions    (HEART FAILURE  PATIENTS) Call MD:  Anytime you have any of the following symptoms: 1) 3 pound weight gain in 24 hours or 5 pounds in 1 week 2) shortness of breath, with or without a dry hacking cough 3) swelling in the hands, feet or stomach 4) if you have to sleep on extra pillows at night in order to breathe.   Complete by:  As directed    Call MD for:  persistant nausea and vomiting   Complete by:  As directed    Call MD for:  redness, tenderness, or signs of infection (pain, swelling, redness, odor or green/yellow discharge around incision site)   Complete by:  As directed    Call MD for:  temperature >100.4   Complete by:  As directed    Diet - low sodium heart healthy  Complete by:  As directed    Increase activity slowly   Complete by:  As directed       Signed: Kathi Ludwig, MD 04/10/2018, 10:08 AM   Pager: Pager# [phone]

## 2018-04-09 NOTE — Discharge Instructions (Signed)
If you have any questions or concerns please do not hesitate to call us at any time.   Indwelling Pleural Catheter Home Guide An indwelling pleural catheter is a thin, flexible tube that is inserted under your skin and into your chest. The catheter drains excess fluid that collects in the area between the chest wall and the lungs (pleural space).After the catheter is inserted, it can be attached to a bottle that collects fluid. The pleural catheter will allow you to drain fluid from your chest at home on a regular basis (sometimes daily). This will eliminate the need for frequent visits to the hospital or clinic to drain the fluid. The catheter may be removed after the excess fluid problem is resolved, usually after 2-3 months. It is important to follow instructions from your health care provider about how to drain and care for your catheter. What are the risks? Generally, this is a safe procedure. However, problems may occur, including:  Infection.  Skin damage around the catheter.  Lung damage.  Failure of the chest tube to work properly.  Spreading of cancer cells along the catheter, if you have cancer.  Supplies needed:  Vacuum-sealed drainage bottle with attached drainage line.  Sterile dressing.  Sterile alcohol pads.  Sterile gloves.  Valve cap.  Sterile gauze pads, 4  4 inch (10 cm  10 cm).  Tape.  Adhesive dressing.  Sterile foam catheter pad. How to care for your catheter and insertion site  Wash your hands with soap and warm water before and after touching the catheter or insertion site. If soap and water are not available, use hand sanitizer.  Check your bandage (dressing) daily to make sure it is clean and dry.  Keep the skin around the catheter clean and dry.  Check the catheter regularly for any cracks or kinks in the tubing.  Check your catheter insertion site every day for signs of infection. Check for: ? Skin breakdown. ? Redness, swelling, or  pain. ? Fluid or blood. ? Warmth. ? Pus or a bad smell. How to drain your catheter You may need to drain your catheter every day, or more or less often as told by your health care provider. Follow instructions from your health care provider about how to drain your catheter. You may also refer to instructions that come with the drainage system. To drain the catheter: 1. Wash your hands with soap and warm water. If soap and water are not available, use hand sanitizer. 2. Carefully remove the dressing from around the catheter. 3. Wash your hands again. 4. Put on the gloves provided. 5. Prepare the vacuum-sealed drainage bottle and drainage line. Close the drainage line of the vacuum-sealed drainage bottle by squeezing the pinch clamp or rolling the wheel of the roller clamp toward the bottle. The vacuum in the bottle will be lost if the line is not closed completely. 6. Remove the access tip cover from the drainage line. Do not touch the end. Set it on a sterile surface. 7. Remove the catheter valve cap and throw it away. 8. Use an alcohol pad to clean the end of the catheter. 9. Insert the access tip into the catheter valve. Make sure the valve and access tip are securely connected. Listen for a click to confirm that they are connected. 10. Insert the T plunger to break the vacuum seal on the drainage bottle. 11. Open the clamp on the drainage line. 12. Allow the catheter to drain. Keep the catheter and the  drainage bottle below the level of your chest. There may be a one-way valve on the end of the tubing that will allow liquid and air to flow out of the catheter without letting air inside. 13. Drain the amount of fluid as told by your health care provider. It usually takes 5-15 minutes. Do not drain more than 1000 mL of fluid. You may feel a little discomfort while you are draining. If the pain is severe, stop draining and contact your health care provider. 14. After you finish draining the  catheter, remove the drainage bottle tubing from the catheter. 15. Use a clean alcohol pad to wipe the catheter tip. 16. Place a clean cap on the end of the catheter. 17. Use an alcohol pad to clean the skin around the catheter. 18. Allow the skin to air-dry. 19. Put the catheter pad on your skin. Curl the catheter into loops and place it on the pad. Do not place the catheter on your skin. 20. Replace the dressing over the catheter. 21. Discard the drainage bottle as instructed by your health care provider. Do not reuse the drainage bottle.  How to change your dressing Change your dressing at least once a week, or more often if needed to keep the dressing dry. Be sure to change the dressing whenever it becomes moist. Your health care provider will tell you how often to change your dressing. 1. Wash your hands with soap and warm water. If soap and water are not available, use hand sanitizer. 2. Gently remove the old dressing. Avoid using scissors to remove the dressing. Sharp objects may damage the catheter. 3. Wash the skin around the insertion site with mild, fragrance-free soap and warm water. Rinse well, then pat the area dry with a clean cloth. 4. Check the skin around the catheter for signs of infection. Check for: ? Skin breakdown. ? Redness, swelling, or pain. ? Fluid or blood. ? Warmth. ? Pus or a bad smell. 5. If your catheter was stitched (sutured) to your skin, look at the suture to make sure it is still anchored in your skin. 6. Do not apply creams, ointments, or alcohol to the area. Let your skin air-dry completely before you apply a new dressing. 7. Curl the catheter into loops and place it on the sterile catheter pad. Do not place the catheter on your skin. 8. If you do not have a pad, use a clean dressing. Slide the dressing under the disk that holds the drainage catheter in place. 9. Use gauze to cover the catheter and the catheter pad. The catheter should rest on the pad or  dressing, not on your skin. 10. Tape the dressing to your skin. You may be instructed to use an adhesive dressing covering instead of gauze and tape. 11. Wash your hands with soap and warm water. If soap and water are not available, use hand sanitizer.  General recommendations  Always wash your hands with soap and warm water before and after caring for your catheter and drainage bottle. Use a mild, fragrance-free soap. If soap and water are not available, use hand sanitizer.  Always make sure there are no leaks in the catheter or drainage bottle.  Each time you drain the catheter, note the color and amount of fluid.  Do not touch the tip of the catheter or the drainage bottle tubing.  Do not reuse drainage bottles.  Do not take baths, swim, or use a hot tub until your health care provider approves.  Ask your health care provider if you may take showers. You may only be allowed to take sponge baths.  Take deep breaths regularly, followed by a cough. Doing this can help to prevent lung infection. Contact a health care provider if:  You have any questions about caring for your catheter or drainage bottle.  You still have pain at the catheter insertion site more than 2 days after your procedure.  You have pain while draining your catheter.  Your catheter becomes bent, twisted, or cracked.  The connection between the catheter and the collection bottle becomes loose.  You have any of these around your catheter insertion site or coming from it: ? Skin breakdown. ? Redness, swelling, or pain. ? Fluid or blood. ? Warmth. ? Pus or a bad smell. Get help right away if:  You have a fever or chills.  You have chest pain.  You have dizziness or shortness of breath.  You have severe redness, swelling, or pain at your catheter insertion site.  The catheter comes out.  The catheter is blocked or clogged. Summary  An indwelling pleural catheter is a thin, flexible tube that is  inserted under your skin and into your chest. The catheter drains excess fluid that collects in the area between the chest wall and the lungs (pleural space).  It is important to follow instructions from your health care provider about how to drain and care for your catheter.  Do not touch the tip of the catheter or the drainage bottle tubing.  Always wash your hands with soap and water before and after caring for your catheter and drainage bottle. If soap and water are not available, use hand sanitizer. This information is not intended to replace advice given to you by your health care provider. Make sure you discuss any questions you have with your health care provider. Document Released: 02/07/2017 Document Revised: 02/07/2017 Document Reviewed: 02/07/2017 Elsevier Interactive Patient Education  [employer].

## 2018-04-09 NOTE — Progress Notes (Signed)
Orders noted for CT scan of brain.  On call MD paged and returned call and states that we can proceed with pt discharge per notes he sees from previous MD who wrote orders.

## 2018-04-09 NOTE — Progress Notes (Signed)
Discharge instructions given and understanding verbalized.  Denies questions.  Daughter also denies questions.  IV removed with cannula intact by NT.  All meds for this shift completed so pt not have to worry about later after discharge.  Pt instructed to call when was done dressing and NT will take him out for discharge.  Verbalized understanding.

## 2018-04-09 NOTE — Progress Notes (Signed)
SATURATION QUALIFICATIONS: (This note is used to comply with regulatory documentation for home oxygen)  Patient Saturations on Room Air at Rest = 98%  Patient Saturations on Room Air while Ambulating = 96%  Patient Saturations on 0 Liters of oxygen while Ambulating = 96%  Please briefly explain why patient needs home oxygen: Pt did not meet criteria for home oxygen

## 2018-04-10 ENCOUNTER — Inpatient Hospital Stay: Payer: Medicare HMO | Attending: Internal Medicine | Admitting: Internal Medicine

## 2018-04-10 ENCOUNTER — Ambulatory Visit: Payer: Medicare HMO | Attending: Internal Medicine | Admitting: Physical Therapy

## 2018-04-10 ENCOUNTER — Other Ambulatory Visit: Payer: Self-pay

## 2018-04-10 ENCOUNTER — Inpatient Hospital Stay: Payer: Medicare HMO

## 2018-04-10 ENCOUNTER — Ambulatory Visit
Admit: 2018-04-10 | Discharge: 2018-04-10 | Disposition: A | Discharge status: Home / Self Care | Payer: Medicare HMO | Attending: Radiation Oncology | Admitting: Radiation Oncology

## 2018-04-10 VITALS — BP 136/85 | HR 104 | Temp 98.0°F | Resp 18 | Ht 70.0 in | Wt 154.0 lb

## 2018-04-10 DIAGNOSIS — J910 Malignant pleural effusion: Secondary | ICD-10-CM | POA: Diagnosis not present

## 2018-04-10 DIAGNOSIS — Z5111 Encounter for antineoplastic chemotherapy: Secondary | ICD-10-CM | POA: Insufficient documentation

## 2018-04-10 DIAGNOSIS — N183 Chronic kidney disease, stage 3 (moderate): Secondary | ICD-10-CM

## 2018-04-10 DIAGNOSIS — C3490 Malignant neoplasm of unspecified part of unspecified bronchus or lung: Secondary | ICD-10-CM

## 2018-04-10 DIAGNOSIS — C7A1 Malignant poorly differentiated neuroendocrine tumors: Secondary | ICD-10-CM | POA: Diagnosis not present

## 2018-04-10 DIAGNOSIS — R2689 Other abnormalities of gait and mobility: Secondary | ICD-10-CM

## 2018-04-10 DIAGNOSIS — N184 Chronic kidney disease, stage 4 (severe): Secondary | ICD-10-CM | POA: Diagnosis not present

## 2018-04-10 DIAGNOSIS — I252 Old myocardial infarction: Secondary | ICD-10-CM

## 2018-04-10 DIAGNOSIS — C3411 Malignant neoplasm of upper lobe, right bronchus or lung: Secondary | ICD-10-CM

## 2018-04-10 DIAGNOSIS — R293 Abnormal posture: Secondary | ICD-10-CM | POA: Diagnosis not present

## 2018-04-10 DIAGNOSIS — C342 Malignant neoplasm of middle lobe, bronchus or lung: Secondary | ICD-10-CM | POA: Diagnosis not present

## 2018-04-10 DIAGNOSIS — R2681 Unsteadiness on feet: Secondary | ICD-10-CM | POA: Diagnosis not present

## 2018-04-10 DIAGNOSIS — I129 Hypertensive chronic kidney disease with stage 1 through stage 4 chronic kidney disease, or unspecified chronic kidney disease: Secondary | ICD-10-CM

## 2018-04-10 DIAGNOSIS — F17210 Nicotine dependence, cigarettes, uncomplicated: Secondary | ICD-10-CM | POA: Diagnosis not present

## 2018-04-10 DIAGNOSIS — Z87891 Personal history of nicotine dependence: Secondary | ICD-10-CM

## 2018-04-10 DIAGNOSIS — Z7189 Other specified counseling: Secondary | ICD-10-CM

## 2018-04-10 DIAGNOSIS — J90 Pleural effusion, not elsewhere classified: Secondary | ICD-10-CM

## 2018-04-10 DIAGNOSIS — Z4682 Encounter for fitting and adjustment of non-vascular catheter: Secondary | ICD-10-CM | POA: Diagnosis not present

## 2018-04-10 DIAGNOSIS — I10 Essential (primary) hypertension: Secondary | ICD-10-CM | POA: Insufficient documentation

## 2018-04-10 DIAGNOSIS — Z720 Tobacco use: Secondary | ICD-10-CM

## 2018-04-10 DIAGNOSIS — F419 Anxiety disorder, unspecified: Secondary | ICD-10-CM | POA: Insufficient documentation

## 2018-04-10 DIAGNOSIS — Z5112 Encounter for antineoplastic immunotherapy: Secondary | ICD-10-CM | POA: Insufficient documentation

## 2018-04-10 DIAGNOSIS — R5382 Chronic fatigue, unspecified: Secondary | ICD-10-CM

## 2018-04-10 DIAGNOSIS — I2510 Atherosclerotic heart disease of native coronary artery without angina pectoris: Secondary | ICD-10-CM | POA: Diagnosis not present

## 2018-04-10 DIAGNOSIS — K279 Peptic ulcer, site unspecified, unspecified as acute or chronic, without hemorrhage or perforation: Secondary | ICD-10-CM | POA: Diagnosis not present

## 2018-04-10 DIAGNOSIS — R634 Abnormal weight loss: Secondary | ICD-10-CM

## 2018-04-10 DIAGNOSIS — Z8709 Personal history of other diseases of the respiratory system: Secondary | ICD-10-CM | POA: Diagnosis not present

## 2018-04-10 MED ORDER — PROCHLORPERAZINE MALEATE 10 MG PO TABS: 10.0000 mg | ORAL_TABLET | Freq: Four times a day (QID) | ORAL | 0 refills | Status: AC | PRN

## 2018-04-10 NOTE — Progress Notes (Signed)
[place] [phone]   [phone] Multidisciplinary thoracic oncology clinic  CONSULT NOTE  REFERRING PHYSICIAN: Dr. Murriel Hopper  REASON FOR CONSULTATION:  77 years old white male recently diagnosed with lung cancer.  HPI Christopher Wilson is a 77 y.o. male with past medical history significant for coronary artery disease, chronic kidney disease and the patient has one kidney, hypertension, dyslipidemia, peptic ulcer disease as well as long history of his smoking.  The patient mentioned that around 6 weeks ago he started feeling bad with lack of energy as well as worsening shortness of breath and cough.  He also lost around 25 pounds during that time.  He presented to the emergency department at the [facility] and CT scan of the chest without contrast was performed at that time and that showed a large right perihilar mass with occlusion of the bronchus intermedius.  There was postobstructive atelectasis of the right middle and lower lobes.  There was a loculated large in size pleural effusion.  The scan also showed pretracheal and subcarinal lymphadenopathy.  He was admitted to [facility] and underwent thoracentesis twice on 03/31/2018 and 04/02/2018 that showed bloody pleural effusion.  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  On 04/07/2018 he underwent bronchoscopy under the care of Dr. Elsworth Soho and the final pathology ([id number]) was consistent with small cell carcinoma. The patient had a Pleurx catheter placed by interventional radiology on 04/08/2018 with drainage of another 2 L of bloody pleural effusion.  He also has drainage at home earlier today with drainage of 950 mL of bloody effusion. He was referred to the multidisciplinary thoracic oncology clinic today for evaluation and recommendation regarding treatment of his condition.  When seen today he continues to feel tired and weak.  He also has shortness of breath  that gets better after drainage of the effusion.  He has no chest pain but continues to have cough.  He has no nausea, vomiting, diarrhea or constipation.  He denied having any headache or visual changes. Family history significant for mother with a stroke and father with heart disease. The patient is single.  His wife died from non-small cell lung cancer in 2010.  He has 2 children and he was accompanied today by his daughter Christopher Wilson.  He works as a Administrator.  He has a history of smoking 1.5 pack/day for around 60 years and quit 2 weeks ago.  He drinks alcohol occasionally and no history of drug abuse.  HPI  Past Medical History:  Diagnosis Date  . ACS (acute coronary syndrome), with ST elevation but stable coronary arteries on cath 06/27/12 06/27/2012  . CAD (coronary artery disease), with CABG 1989 and re-do Cabg 2002 X 3 vesssels.  progressive disease with Stent to Lt. Main, and 1st diag 2006, stent to VG to LCX 2007, with known occlusion history o  06/27/2012  . CKD (chronic kidney disease) stage 3, GFR 30-59 ml/min (HCC) 06/27/2012  . 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 Coronary artery disease   . GERD (gastroesophageal reflux disease)   . Hyperlipidemia LDL goal < 70 06/27/2012  . Hypertension   . Myocardial infarction ([place])   . Peptic ulcer   . STEMI (ST elevation myocardial infarction) ([place]) 06/27/2012  . Tobacco abuse 06/27/2012    Past Surgical History:  Procedure Laterality Date  . coratid arterty surgery    . CORONARY ANGIOPLASTY WITH STENT PLACEMENT    .  CORONARY ARTERY BYPASS GRAFT    . IR GUIDED DRAIN W CATHETER PLACEMENT  04/08/2018  . LCEA    . LEFT HEART CATH N/A 06/27/2012   Procedure: LEFT HEART CATH;  Surgeon: Troy Sine, MD;  Location: [facility] CATH LAB;  Service: Cardiovascular;  Laterality: N/A;  . triple bipass     Hx of CABG and RE do  . VASCULAR SURGERY    . VIDEO BRONCHOSCOPY Bilateral 04/07/2018   Procedure: VIDEO BRONCHOSCOPY WITHOUT FLUORO;  Surgeon:  Rush Farmer, MD;  Location: [facility] ENDOSCOPY;  Service: Cardiopulmonary;  Laterality: Bilateral;    Family History  Problem Relation Age of Onset  . Stroke Mother   . Heart attack Father   . Stroke Maternal Aunt     Social History Social History   Tobacco Use  . Smoking status: Current Every Day Smoker    Packs/day: 1.50  . Smokeless tobacco: Never Used  Substance Use Topics  . Alcohol use: Yes    Alcohol/week: 6.0 oz    Types: 10 Cans of beer per week  . Drug use: No    No Known Allergies  Current Outpatient Medications  Medication Sig Dispense Refill  . albuterol (PROVENTIL HFA;VENTOLIN HFA) 108 (90 Base) MCG/ACT inhaler Inhale 2 puffs into the lungs every 6 (six) hours as needed for wheezing or shortness of breath.    Marland Kitchen aspirin 81 MG chewable tablet Chew 81 mg by mouth daily.    Marland Kitchen atorvastatin (LIPITOR) 80 MG tablet TAKE ONE TABLET BY MOUTH DAILY AT 6 P.M. NEEDS APPOINTMENT FOR FUTURE REFILLS. 90 tablet 3  . metoprolol tartrate (LOPRESSOR) 25 MG tablet TAKE ONE TABLET BY MOUTH TWICE DAILY (PLEASE  CONTACT  OFFICE  FOR  ADDITIONAL  REFILLS  ) 180 tablet 2  . nitroGLYCERIN (NITROSTAT) 0.4 MG SL tablet Place 1 tablet (0.4 mg total) under the tongue every 5 (five) minutes as needed for chest pain. 25 tablet 3  . Probiotic Product (PROBIOTIC-10 PO) Take 1 tablet by mouth daily.     No current facility-administered medications for this visit.     Review of Systems  Constitutional: positive for anorexia, fatigue and weight loss Eyes: negative Ears, nose, mouth, throat, and face: negative Respiratory: positive for cough, dyspnea on exertion and sputum Cardiovascular: negative Gastrointestinal: negative Genitourinary:negative Integument/breast: negative Hematologic/lymphatic: negative Musculoskeletal:positive for muscle weakness Neurological: negative Behavioral/Psych: negative Endocrine: negative Allergic/Immunologic: negative  Physical Exam  SWF:UXNAT, healthy, no  distress, well nourished, well developed and anxious SKIN: skin color, texture, turgor are normal, no rashes or significant lesions HEAD: Normocephalic, No masses, lesions, tenderness or abnormalities EYES: normal, PERRLA, Conjunctiva are pink and non-injected EARS: External ears normal, Canals clear OROPHARYNX:no exudate, no erythema and lips, buccal mucosa, and tongue normal  NECK: supple, no adenopathy, no JVD LYMPH:  no palpable lymphadenopathy, no hepatosplenomegaly LUNGS: decreased breath sounds HEART: regular rate & rhythm, no murmurs and no gallops ABDOMEN:abdomen soft, non-tender, normal bowel sounds and no masses or organomegaly BACK: Back symmetric, no curvature., No CVA tenderness EXTREMITIES:no joint deformities, effusion, or inflammation, no edema  NEURO: alert & oriented x 3 with fluent speech, no focal motor/sensory deficits  PERFORMANCE STATUS: ECOG 1-2  LABORATORY DATA: Lab Results  Component Value Date   WBC 6.1 04/03/2018   HGB 10.6 (L) 04/03/2018   HCT 33.7 (L) 04/03/2018   MCV 92.8 04/03/2018   PLT 262 04/03/2018      Chemistry      Component Value Date/Time   NA 139  04/07/2018 0227   NA 140 03/21/2018 1048   K 5.0 04/07/2018 0227   CL 108 04/07/2018 0227   CO2 21 (L) 04/07/2018 0227   BUN 27 (H) 04/07/2018 0227   BUN 45 (H) 03/21/2018 1048   CREATININE 1.88 (H) 04/07/2018 0227   CREATININE 1.80 (H) 09/06/2014 1033      Component Value Date/Time   CALCIUM 9.0 04/07/2018 0227   ALKPHOS 100 03/31/2018 0830   AST 20 03/31/2018 0830   ALT 18 03/31/2018 0830   BILITOT 0.7 03/31/2018 0830   BILITOT 0.4 03/21/2018 1048       RADIOGRAPHIC STUDIES: Dg Chest 2 View  Result Date: 04/06/2018 CLINICAL DATA:  Pleural effusion EXAM: CHEST - 2 VIEW COMPARISON:  04/05/2018 FINDINGS: Enlargement of cardiac silhouette post CABG. Coronary stents noted. Atherosclerotic calcification aorta. Mediastinal contours and pulmonary vascularity normal. Large RIGHT pleural  effusion with significant atelectasis in the mid to lower RIGHT lung. LEFT lung clear. No pneumothorax. Bones unremarkable. IMPRESSION: Large RIGHT pleural effusion with significant atelectasis of the mid to lower RIGHT lung. Enlargement of cardiac silhouette post CABG and coronary PTCA/stenting. Electronically Signed   By: Lavonia Dana M.D.   On: 04/06/2018 16:13   Dg Chest 2 View  Result Date: 03/31/2018 CLINICAL DATA:  Shortness of breath. EXAM: CHEST - 2 VIEW COMPARISON:  03/24/2018 chest CT FINDINGS: Extensive opacification of the right chest with leftward mediastinal shift. This is a combination of lung mass and pleural effusion that show signs of loculation. Normal heart size. Status post CABG and stenting. 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 These results were called by telephone at the time of interpretation on 03/31/2018 at 8:48 am to [place] who verbally acknowledged these results. IMPRESSION: Unchanged extensive opacification of the right hemithorax, a combination of lung mass and pleural effusion by CT. The pleural effusion show signs of loculation and is likely malignant. Electronically Signed   By: Monte Fantasia M.D.   On: 03/31/2018 08:58   Ct Chest Wo Contrast  Result Date: 03/24/2018 CLINICAL DATA:  Shortness of breath and weakness for 2 weeks. EXAM: CT CHEST WITHOUT CONTRAST TECHNIQUE: Multidetector CT imaging of the chest was performed following the standard protocol without IV contrast. COMPARISON:  Chest radiograph 06/27/2012 FINDINGS: Cardiovascular: Mildly enlarged heart. Mild pericardial thickening. Calcific atherosclerotic disease of the coronary arteries and aorta. Post CABG. Tortuosity of the thoracic aorta. Mediastinum/Nodes: Pretracheal and subcarinal lymphadenopathy. Esophagus and thyroid gland appear normal. Lungs/Pleura: There is a large right perihilar mass, with occlusion of the bronchus intermedius. Postobstructive atelectasis of the right middle and lower  lobes prevent detailed evaluation and measurement. There is a loculated large in size pleural effusion. There is pre-existing upper lobe predominant paraseptal emphysema. Subpleural scarring versus pulmonary nodule with peripheral calcifications seen in the right lung apex measuring 13.8 mm. Interstitial edema versus lymphangitic spread of malignancy causes increased interstitial markings in the right upper lobe. Mild linear scarring versus atelectasis in the left lower lobe. Upper Abdomen: The right kidney is extremely atrophic or surgically absent. 2.5 cm hypoattenuated mass in the midpole region of the left kidney. Left nephrolithiasis. No evidence of hydronephrosis. Musculoskeletal: No suspicious osseous lesions. Anterior wedging deformity of L1 vertebral body with chronic appearance. IMPRESSION: Large right perihilar mass versus round pneumonia. The complete interruption of the bronchus intermedius favors malignancy. Postobstructive atelectasis of the right middle and right lower lobes. Interstitial thickening in the right upper lobe may represent lymphangitic spread of disease or an element  of edema. Large loculated right pleural effusion. Right apical 14 mm secondary nodule versus scarring. Underlying upper lobe predominant paraseptal emphysema. 2.5 cm hypoattenuated mass in the midpole region of the left kidney, incompletely evaluated due to lack of IV contrast. Surgically absent or extremely atrophic right kidney. Aortic Atherosclerosis (ICD10-I70.0) and Emphysema (ICD10-J43.9). These results were called by telephone at the time of interpretation on 03/24/2018 at 4:44 pm to Dr. Charlesetta Shanks , who verbally acknowledged these results. Electronically Signed   By: Fidela Salisbury M.D.   On: 03/24/2018 16:47   Ir Guided Niel Hummer W Catheter Placement  Result Date: 04/08/2018 INDICATION: 77 year old male with a malignant right-sided pleural effusion. He presents for tunneled pleural drainage catheter  placement EXAM: Pleural tunneled drainage catheter placement COMPARISON:  CT scan of the chest 03/24/2018 MEDICATIONS: 2 g Ancef ANESTHESIA/SEDATION: The patient was continuously monitored during the procedure by the interventional radiology nurse under my direct supervision. Fentanyl 100 mcg IV; Versed 2 mg IV Moderate Sedation Time:  28 minutes COMPLICATIONS: None immediate. PROCEDURE: Informed written consent was obtained from the patient after a thorough discussion of the procedural risks, benefits and alternatives. All questions were addressed. Maximal Sterile Barrier Technique was utilized including caps, mask, sterile gowns, sterile gloves, sterile drape, hand hygiene and skin antiseptic. A timeout was performed prior to the initiation of the procedure. The right chest was interrogated with ultrasound. There is a large right-sided pleural effusion. A suitable skin entry site was selected and marked. Local anesthesia was attained by infiltration with 1% lidocaine. A small dermatotomy was made. A skin exit site was selected 5 cm superior and medial to the planned pleural entry site. Local anesthesia was again attained by infiltration with 1% lidocaine. A second small dermatotomy was made. The subcutaneous tract between the 2 incisions was then anesthetized. An 18 gauge sheath needle was advanced through the dermatotomy at the pleural entry site and into the pleural fluid. The needle portion was removed. A 0.035 wire was advanced into the pleural space. The tunneled PleurX catheter was then tunneled from the skin exit site to the dermatotomy overlying the pleural access site. A peel-away sheath was advanced over the wire and into the pleural space. The PleurX catheter was then fed through the sheath and into the pleural space. Aspiration was performed yielding 2 L of bloody pleural fluid. The catheter was then secured to the skin with 0 Prolene suture. The dermatotomy at the pleural access site was closed with a  single inverted interrupted 3 0 Vicryl suture in the epidermis sealed with Dermabond. IMPRESSION: Successful placement of a right-sided tunneled pleural drainage catheter with removal of 2 L of bloody pleural fluid. Electronically Signed   By: Jacqulynn Cadet M.D.   On: 04/08/2018 14:07   Dg Chest Port 1 View  Result Date: 04/07/2018 CLINICAL DATA:  Pleural effusion.  Status post bronchoscopy. EXAM: PORTABLE CHEST 1 VIEW COMPARISON:  Chest x-rays dated 04/06/2018 in 04/05/2018. FINDINGS: Large opacity of the RIGHT hemithorax is unchanged, corresponding to the large pleural effusion demonstrated on chest CT of 03/24/2018. LEFT lung is clear. No pneumothorax seen. Cardiomediastinal silhouette is grossly stable. Median sternotomy wires in place. No acute or suspicious osseous finding. IMPRESSION: Large RIGHT pleural effusion is stable. No procedural complicating feature identified, status post bronchoscopy. Electronically Signed   By: Franki Cabot M.D.   On: 04/07/2018 14:03   Dg Chest Port 1 View  Result Date: 04/05/2018 CLINICAL DATA:  Pleural effusion EXAM: PORTABLE CHEST 1 VIEW  COMPARISON:  Yesterday FINDINGS: Artifact from EKG leads. Sizable right pleural effusion obscuring a known right lung mass. The right diaphragm is likely elevated. Cardiomegaly. Status post CABG and coronary stenting. The left lung is clear. IMPRESSION: Stable right pleural effusion with underlying mass. Electronically Signed   By: Monte Fantasia M.D.   On: 04/05/2018 09:31   Dg Chest Port 1 View  Result Date: 04/04/2018 CLINICAL DATA:  Shortness of breath EXAM: PORTABLE CHEST 1 VIEW COMPARISON:  04/02/2018 FINDINGS: Again noted is the right pleural effusion and right lower lung consolidation, stable. Prior CABG. Heart is mildly enlarged. Left lung clear. No acute bony abnormality. IMPRESSION: Continued right lower lung consolidation and right effusion. Unchanged. Cardiomegaly. Electronically Signed   By: Rolm Baptise M.D.    On: 04/04/2018 10:12   Dg Chest Port 1 View  Result Date: 04/02/2018 CLINICAL DATA:  Status post RIGHT thoracentesis EXAM: PORTABLE CHEST 1 VIEW COMPARISON:  04/02/2018 and prior radiographs FINDINGS: Large RIGHT pleural effusion has decreased, now at least moderate. RIGHT LOWER lung consolidation/atelectasis noted. Cardiomegaly and CABG changes are present. There is no evidence of pneumothorax. IMPRESSION: Decreased RIGHT pleural effusion following thoracentesis-no evidence of pneumothorax. Residual RIGHT pleural effusion with RIGHT LOWER lung consolidation/atelectasis Electronically Signed   By: Margarette Canada M.D.   On: 04/02/2018 15:47   Dg Chest Port 1 View  Result Date: 04/02/2018 CLINICAL DATA:  Shortness of breath. Right pleural effusion. Coronary artery disease. EXAM: PORTABLE CHEST 1 VIEW COMPARISON:  03/31/2018 FINDINGS: Large right pleural effusion shows mild increase in size since previous study. Collapse or consolidation of the right mid and lower lung is again seen. Left lung remains clear. Heart size is stable. Prior CABG again noted. IMPRESSION: Slight increase in size of large right pleural effusion. Stable right lung collapse or consolidation. Electronically Signed   By: Earle Gell M.D.   On: 04/02/2018 09:47   Dg Chest Port 1 View  Result Date: 03/31/2018 CLINICAL DATA:  Status post right thoracentesis.  Smoker. EXAM: PORTABLE CHEST 1 VIEW COMPARISON:  03/31/2018. FINDINGS: Decreased size of a large right pleural effusion with less adjacent atelectasis. No pneumothorax. Clear left lung. The left lung remains hyperexpanded. Stable enlarged cardiac silhouette, post CABG changes and coronary artery stent. Mild thoracic spine and right shoulder degenerative changes. IMPRESSION: 1. Decreased size of a large right pleural effusion following thoracentesis with less associated atelectasis. 2. No pneumothorax. 3. Stable cardiomegaly and changes of COPD. Electronically Signed   By: Claudie Revering M.D.    On: 03/31/2018 15:38    ASSESSMENT: This is a very pleasant 77 years old white male recently diagnosed with extensive stage (T4, N2, M1a) small cell lung cancer presented with large right perihilar mass with mediastinal invasion and loculated malignant right pleural effusion diagnosed in June 2019.   PLAN: I had a lengthy discussion with the patient and his daughter today about his current disease stage, prognosis and treatment options. I personally and independently reviewed his the scan images and discussed the result and showed the images to the patient and his daughter. I recommended for the patient to complete the staging work-up by ordering a PET scan as well as MRI of the brain to rule out any other metastatic disease. I explained to the patient that he has incurable condition and all the treatment options will be of palliative nature. I gave him the option of palliative care and hospice referral versus consideration of palliative systemic chemotherapy with carboplatin for AUC of 5  on day 1, etoposide 100 mg/M2 on days 1, 2 and 3 in addition to Tecentriq 1200 mg IV every 3 weeks with Neulasta support. The patient is interested in proceeding with treatment. I discussed with him the adverse effect of this treatment including but not limited to alopecia, myelosuppression, nausea and vomiting, peripheral neuropathy, liver or renal dysfunction. He is expected to start the first cycle of this treatment early next week because of his poor condition he cannot wait too long before starting treatment. The patient will have a chemotherapy education class before the first dose of his treatment. I will call his pharmacy with prescription for Compazine 10 mg p.o. every 6 hours as needed for nausea. The patient will come back for follow-up visit 1 week after his treatment for evaluation and management of any adverse effect of his treatment. For the recurrent malignant pleural effusion, he will continue  the drainage via the Pleurx catheter on daily basis or every other day as needed. The patient was seen during the multidisciplinary thoracic oncology clinic today by medical oncology, radiation oncology, physical therapist as well as thoracic navigator. He was advised to call immediately if he has any concerning symptoms in the interval.  The patient voices understanding of current disease status and treatment options and is in agreement with the current care plan.  All questions were answered. The patient knows to call the clinic with any problems, questions or concerns. We can certainly see the patient much sooner if necessary.  Thank you so much for allowing me to participate in the care of Keldan O Wilson. I will continue to follow up the patient with you and assist in his care.  I spent 55 minutes counseling the patient face to face. The total time spent in the appointment was 80 minutes.  Disclaimer: This note was dictated with voice recognition software. Similar sounding words can inadvertently be transcribed and may not be corrected upon review.   Eilleen Kempf April 10, 2018, 3:38 PM

## 2018-04-10 NOTE — Progress Notes (Signed)
START ON PATHWAY REGIMEN - Small Cell Lung     Cycles 1 through 4, every 21 days:     Atezolizumab      Carboplatin      Etoposide    Cycles 5 and beyond, every 21 days:     Atezolizumab   **Always confirm dose/schedule in your pharmacy ordering system**  Patient Characteristics: Extensive Stage, First Line Stage Classification: Extensive AJCC T Category: T4 AJCC N Category: N2 AJCC M Category: M1a AJCC 8 Stage Grouping: IVA Line of therapy: First Line  Intent of Therapy: Non-Curative / Palliative Intent, Discussed with Patient

## 2018-04-10 NOTE — Progress Notes (Signed)
Radiation Oncology         [phone] ________________________________  Multidisciplinary Thoracic Oncology Clinic [facility]) Initial Outpatient Consultation  Name: Christopher Wilson MRN: [id number]  Date: 04/10/2018  DOB: 1941-06-06  FW:YOVZCH, Pcp Not In  Curt Bears, MD   REFERRING PHYSICIAN: Curt Bears, MD  DIAGNOSIS: Small cell lung cancer, staging pending Brain MRI and PET scan    ICD-10-CM   1. 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 He is accompanied by his daughter today.  He initially presented with severe fatigue, weight loss (28 lbs) over the past several months with dyspnea as well. 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  He was evaluated at [facility] ED on 03/24/2018 with a CT Chest that showed: Large right perihilar mass versus round pneumonia. The complete interruption of the bronchus intermedius favors malignancy. Postobstructive atelectasis of the right middle and right lower lobes. Interstitial thickening in the right upper lobe may represent lymphangitic spread of disease or an element of edema. Large loculated right pleural effusion. Right apical 14 mm secondary nodule versus scarring. Underlying upper lobe predominant paraseptal emphysema. 2.5 cm hypoattenuated mass in the midpole region of the left kidney, incompletely evaluated due to lack of IV contrast. Surgically absent or extremely atrophic right kidney. Aortic Atherosclerosis (ICD10-I70.0) and Emphysema (ICD10-J43.9). At that time he was advised admission and he declined and opted for an abx treatment  for possible pneumonia.    He was then evaluated at [facility]ED on 03/31/2018 with worsening dyspnea and was admitted to the hospital where he had a thoracentesis completed that revealed: Pleural fluid, right (specimen 1 of 1 collected 03/31/2018) with rare atypical cells present. On 04/02/2018, he had a repeat thoracentesis that showed: pleural fluid, right (specimen 1 of 1 04/02/2018) with malignant cells present.   He then proceeded to a bronchoscopy on 04/07/2018 with cytology showing: Bronchial brushing, right (specimen 1 of 2 collected 04/07/2018) with malignant cells consistent with small cell carcinoma. Bronchial lavage #2 (specimen 2 of 2 collected 04/07/2018) suspicious for malignancy.   He also had a biopsy of his lung completed on 04/07/2018 with pathology showing: Lung, biopsy, RML with small cell carcinoma.   On review of systems, he reports pain to the site of his plurex catheter. he denies CP, hemoptysis, back pain, and any other symptoms. Pertinent positives are listed and detailed within the above HPI. He denies using oxygen at home. He notes that he had an episode of diarrhea prior to the onset of his diagnosis that he self-treated with probiotics. He has had 4 drainings of his pleurx catheter.   The patient was referred today for presentation in the multidisciplinary conference.  Radiology studies and pathology slides were presented there for review and discussion of treatment options.  A consensus was discussed regarding potential next steps.  PREVIOUS RADIATION THERAPY: No  PAST MEDICAL HISTORY:  has a past medical history of ACS (acute coronary syndrome), with ST elevation but stable coronary arteries on cath 06/27/12 (06/27/2012), CAD (coronary artery disease),  with CABG 1989 and re-do Cabg 2002 X 3 vesssels.  progressive disease with Stent to Lt. Main, and 1st diag 2006, stent to VG to LCX 2007, with known occlusion history o  (06/27/2012), CKD (chronic kidney disease) stage 3, GFR 30-59 ml/min  ([place]) (06/27/2012), Congenital single kidney (06/27/2012), Coronary artery disease, GERD (gastroesophageal reflux disease), Hyperlipidemia LDL goal < 70 (06/27/2012), Hypertension, Myocardial infarction ([place]), Peptic ulcer, STEMI (ST elevation myocardial infarction) ([place]) (06/27/2012), and Tobacco abuse (06/27/2012).    PAST SURGICAL HISTORY: Past Surgical History:  Procedure Laterality Date  . coratid arterty surgery    . CORONARY ANGIOPLASTY WITH STENT PLACEMENT    . CORONARY ARTERY BYPASS GRAFT    . IR GUIDED DRAIN W CATHETER PLACEMENT  04/08/2018  . LCEA    . LEFT HEART CATH N/A 06/27/2012   Procedure: LEFT HEART CATH;  Surgeon: Troy Sine, MD;  Location: [facility] CATH LAB;  Service: Cardiovascular;  Laterality: N/A;  . triple bipass     Hx of CABG and RE do  . VASCULAR SURGERY    . VIDEO BRONCHOSCOPY Bilateral 04/07/2018   Procedure: VIDEO BRONCHOSCOPY WITHOUT FLUORO;  Surgeon: Rush Farmer, MD;  Location: [facility] ENDOSCOPY;  Service: Cardiopulmonary;  Laterality: Bilateral;    FAMILY HISTORY: family history includes Heart attack in his father; Stroke in his maternal aunt and mother.  SOCIAL HISTORY:  reports that he has been smoking.  He has been smoking about 1.50 packs per day. He has never used smokeless tobacco. He reports that he drinks about 6.0 oz of alcohol per week. He reports that he does not use drugs.  ALLERGIES: Patient has no known allergies.  MEDICATIONS:  Current Outpatient Medications  Medication Sig Dispense Refill  . albuterol (PROVENTIL HFA;VENTOLIN HFA) 108 (90 Base) MCG/ACT inhaler Inhale 2 puffs into the lungs every 6 (six) hours as needed for wheezing or shortness of breath.    Marland Kitchen aspirin 81 MG chewable tablet Chew 81 mg by mouth daily.    Marland Kitchen atorvastatin (LIPITOR) 80 MG tablet TAKE ONE TABLET BY MOUTH DAILY AT 6 P.M. NEEDS APPOINTMENT FOR FUTURE REFILLS. 90 tablet 3  . metoprolol tartrate (LOPRESSOR) 25 MG tablet TAKE ONE TABLET BY MOUTH TWICE DAILY (PLEASE  CONTACT   OFFICE  FOR  ADDITIONAL  REFILLS  ) 180 tablet 2  . nitroGLYCERIN (NITROSTAT) 0.4 MG SL tablet Place 1 tablet (0.4 mg total) under the tongue every 5 (five) minutes as needed for chest pain. 25 tablet 3  . Probiotic Product (PROBIOTIC-10 PO) Take 1 tablet by mouth daily.    . prochlorperazine (COMPAZINE) 10 MG tablet Take 1 tablet (10 mg total) by mouth every 6 (six) hours as needed for nausea or vomiting. 30 tablet 0   No current facility-administered medications for this encounter.     REVIEW OF SYSTEMS:  REVIEW OF SYSTEMS: A 10+ POINT REVIEW OF SYSTEMS WAS OBTAINED including neurology, dermatology, psychiatry, cardiac, respiratory, lymph, extremities, GI, GU, musculoskeletal, constitutional, reproductive, HEENT. All pertinent positives are noted in the HPI. All others are negative.    PHYSICAL EXAM:   Vitals - 1 value per visit 7/42/5956  SYSTOLIC 387  DIASTOLIC 85  Pulse 564  Temperature 98  Respirations 18  Weight (lb) 154  Height 5\' 10"   BMI 22.1  VISIT REPORT    General: Alert and oriented, in no acute distress. Appears fatigued HEENT: Head is normocephalic. Extraocular movements are intact. Oropharynx is clear. Neck: Neck is supple, no palpable cervical or supraclavicular lymphadenopathy.  Heart: Regular in rate and rhythm with no murmurs, rubs, or gallops. Chest:  decreased breath sounds in the right lower lung field with no rhonchi, wheezes, or rales. Abdomen: Soft, nontender, nondistended, with no rigidity or guarding. Extremities: No cyanosis or edema. Lymphatics: see Neck Exam Skin: No concerning lesions. Musculoskeletal: symmetric strength and muscle tone throughout. Neurologic: Cranial nerves II through XII are grossly intact. No obvious focalities. Speech is fluent. Coordination is intact. Psychiatric: Judgment and insight are intact. Affect is appropriate.   ECOG FS: 2 - Symptomatic, <50% confined to bed    LABORATORY DATA:  Lab Results  Component Value Date    WBC 6.1 04/03/2018   HGB 10.6 (L) 04/03/2018   HCT 33.7 (L) 04/03/2018   MCV 92.8 04/03/2018   PLT 262 04/03/2018   Lab Results  Component Value Date   NA 139 04/07/2018   K 5.0 04/07/2018   CL 108 04/07/2018   CO2 21 (L) 04/07/2018   Lab Results  Component Value Date   ALT 18 03/31/2018   AST 20 03/31/2018   ALKPHOS 100 03/31/2018   BILITOT 0.7 03/31/2018    PULMONARY FUNCTION TEST:   Recent Review Flowsheet Data    There is no flowsheet data to display.      RADIOGRAPHY: Dg Chest 2 View  Result Date: 04/06/2018 CLINICAL DATA:  Pleural effusion EXAM: CHEST - 2 VIEW COMPARISON:  04/05/2018 FINDINGS: Enlargement of cardiac silhouette post CABG. Coronary stents noted. Atherosclerotic calcification aorta. Mediastinal contours and pulmonary vascularity normal. Large RIGHT pleural effusion with significant atelectasis in the mid to lower RIGHT lung. LEFT lung clear. No pneumothorax. Bones unremarkable. IMPRESSION: Large RIGHT pleural effusion with significant atelectasis of the mid to lower RIGHT lung. Enlargement of cardiac silhouette post CABG and coronary PTCA/stenting. Electronically Signed   By: Lavonia Dana M.D.   On: 04/06/2018 16:13   Dg Chest 2 View  Result Date: 03/31/2018 CLINICAL DATA:  Shortness of breath. EXAM: CHEST - 2 VIEW COMPARISON:  03/24/2018 chest CT FINDINGS: Extensive opacification of the right chest with leftward mediastinal shift. This is a combination of lung mass and pleural effusion that show signs of loculation. Normal heart size. Status post CABG and stenting. 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 These results were called by telephone at the time of interpretation on 03/31/2018 at 8:48 am to [place] who verbally acknowledged these results. IMPRESSION: Unchanged extensive opacification of the right hemithorax, a combination of lung mass and pleural effusion by CT. The pleural effusion show signs of loculation and is likely malignant. Electronically  Signed   By: Monte Fantasia M.D.   On: 03/31/2018 08:58   Ct Chest Wo Contrast  Result Date: 03/24/2018 CLINICAL DATA:  Shortness of breath and weakness for 2 weeks. EXAM: CT CHEST WITHOUT CONTRAST TECHNIQUE: Multidetector CT imaging of the chest was performed following the standard protocol without IV contrast. COMPARISON:  Chest radiograph 06/27/2012 FINDINGS: Cardiovascular: Mildly enlarged heart. Mild pericardial thickening. Calcific atherosclerotic disease of the coronary arteries and aorta. Post CABG. Tortuosity of the thoracic aorta. Mediastinum/Nodes: Pretracheal and subcarinal lymphadenopathy. Esophagus and thyroid gland appear normal. Lungs/Pleura: There is a large right perihilar mass, with occlusion of the bronchus intermedius. Postobstructive atelectasis of the right middle and lower lobes prevent detailed evaluation and measurement. There is a loculated large in size pleural effusion. There is pre-existing upper lobe predominant paraseptal emphysema. Subpleural scarring versus pulmonary nodule with peripheral calcifications seen in the right lung apex measuring 13.8 mm.  Interstitial edema versus lymphangitic spread of malignancy causes increased interstitial markings in the right upper lobe. Mild linear scarring versus atelectasis in the left lower lobe. Upper Abdomen: The right kidney is extremely atrophic or surgically absent. 2.5 cm hypoattenuated mass in the midpole region of the left kidney. Left nephrolithiasis. No evidence of hydronephrosis. Musculoskeletal: No suspicious osseous lesions. Anterior wedging deformity of L1 vertebral body with chronic appearance. IMPRESSION: Large right perihilar mass versus round pneumonia. The complete interruption of the bronchus intermedius favors malignancy. Postobstructive atelectasis of the right middle and right lower lobes. Interstitial thickening in the right upper lobe may represent lymphangitic spread of disease or an element of edema. Large  loculated right pleural effusion. Right apical 14 mm secondary nodule versus scarring. Underlying upper lobe predominant paraseptal emphysema. 2.5 cm hypoattenuated mass in the midpole region of the left kidney, incompletely evaluated due to lack of IV contrast. Surgically absent or extremely atrophic right kidney. Aortic Atherosclerosis (ICD10-I70.0) and Emphysema (ICD10-J43.9). These results were called by telephone at the time of interpretation on 03/24/2018 at 4:44 pm to Dr. Charlesetta Shanks , who verbally acknowledged these results. Electronically Signed   By: Fidela Salisbury M.D.   On: 03/24/2018 16:47   Ir Guided Niel Hummer W Catheter Placement  Result Date: 04/08/2018 INDICATION: 77 year old male with a malignant right-sided pleural effusion. He presents for tunneled pleural drainage catheter placement EXAM: Pleural tunneled drainage catheter placement COMPARISON:  CT scan of the chest 03/24/2018 MEDICATIONS: 2 g Ancef ANESTHESIA/SEDATION: The patient was continuously monitored during the procedure by the interventional radiology nurse under my direct supervision. Fentanyl 100 mcg IV; Versed 2 mg IV Moderate Sedation Time:  28 minutes COMPLICATIONS: None immediate. PROCEDURE: Informed written consent was obtained from the patient after a thorough discussion of the procedural risks, benefits and alternatives. All questions were addressed. Maximal Sterile Barrier Technique was utilized including caps, mask, sterile gowns, sterile gloves, sterile drape, hand hygiene and skin antiseptic. A timeout was performed prior to the initiation of the procedure. The right chest was interrogated with ultrasound. There is a large right-sided pleural effusion. A suitable skin entry site was selected and marked. Local anesthesia was attained by infiltration with 1% lidocaine. A small dermatotomy was made. A skin exit site was selected 5 cm superior and medial to the planned pleural entry site. Local anesthesia was again  attained by infiltration with 1% lidocaine. A second small dermatotomy was made. The subcutaneous tract between the 2 incisions was then anesthetized. An 18 gauge sheath needle was advanced through the dermatotomy at the pleural entry site and into the pleural fluid. The needle portion was removed. A 0.035 wire was advanced into the pleural space. The tunneled PleurX catheter was then tunneled from the skin exit site to the dermatotomy overlying the pleural access site. A peel-away sheath was advanced over the wire and into the pleural space. The PleurX catheter was then fed through the sheath and into the pleural space. Aspiration was performed yielding 2 L of bloody pleural fluid. The catheter was then secured to the skin with 0 Prolene suture. The dermatotomy at the pleural access site was closed with a single inverted interrupted 3 0 Vicryl suture in the epidermis sealed with Dermabond. IMPRESSION: Successful placement of a right-sided tunneled pleural drainage catheter with removal of 2 L of bloody pleural fluid. Electronically Signed   By: Jacqulynn Cadet M.D.   On: 04/08/2018 14:07   Dg Chest Port 1 View  Result Date: 04/07/2018 CLINICAL DATA:  Pleural effusion.  Status post bronchoscopy. EXAM: PORTABLE CHEST 1 VIEW COMPARISON:  Chest x-rays dated 04/06/2018 in 04/05/2018. FINDINGS: Large opacity of the RIGHT hemithorax is unchanged, corresponding to the large pleural effusion demonstrated on chest CT of 03/24/2018. LEFT lung is clear. No pneumothorax seen. Cardiomediastinal silhouette is grossly stable. Median sternotomy wires in place. No acute or suspicious osseous finding. IMPRESSION: Large RIGHT pleural effusion is stable. No procedural complicating feature identified, status post bronchoscopy. Electronically Signed   By: Franki Cabot M.D.   On: 04/07/2018 14:03   Dg Chest Port 1 View  Result Date: 04/05/2018 CLINICAL DATA:  Pleural effusion EXAM: PORTABLE CHEST 1 VIEW COMPARISON:  Yesterday  FINDINGS: Artifact from EKG leads. Sizable right pleural effusion obscuring a known right lung mass. The right diaphragm is likely elevated. Cardiomegaly. Status post CABG and coronary stenting. The left lung is clear. IMPRESSION: Stable right pleural effusion with underlying mass. Electronically Signed   By: Monte Fantasia M.D.   On: 04/05/2018 09:31   Dg Chest Port 1 View  Result Date: 04/04/2018 CLINICAL DATA:  Shortness of breath EXAM: PORTABLE CHEST 1 VIEW COMPARISON:  04/02/2018 FINDINGS: Again noted is the right pleural effusion and right lower lung consolidation, stable. Prior CABG. Heart is mildly enlarged. Left lung clear. No acute bony abnormality. IMPRESSION: Continued right lower lung consolidation and right effusion. Unchanged. Cardiomegaly. Electronically Signed   By: Rolm Baptise M.D.   On: 04/04/2018 10:12   Dg Chest Port 1 View  Result Date: 04/02/2018 CLINICAL DATA:  Status post RIGHT thoracentesis EXAM: PORTABLE CHEST 1 VIEW COMPARISON:  04/02/2018 and prior radiographs FINDINGS: Large RIGHT pleural effusion has decreased, now at least moderate. RIGHT LOWER lung consolidation/atelectasis noted. Cardiomegaly and CABG changes are present. There is no evidence of pneumothorax. IMPRESSION: Decreased RIGHT pleural effusion following thoracentesis-no evidence of pneumothorax. Residual RIGHT pleural effusion with RIGHT LOWER lung consolidation/atelectasis Electronically Signed   By: Margarette Canada M.D.   On: 04/02/2018 15:47   Dg Chest Port 1 View  Result Date: 04/02/2018 CLINICAL DATA:  Shortness of breath. Right pleural effusion. Coronary artery disease. EXAM: PORTABLE CHEST 1 VIEW COMPARISON:  03/31/2018 FINDINGS: Large right pleural effusion shows mild increase in size since previous study. Collapse or consolidation of the right mid and lower lung is again seen. Left lung remains clear. Heart size is stable. Prior CABG again noted. IMPRESSION: Slight increase in size of large right pleural  effusion. Stable right lung collapse or consolidation. Electronically Signed   By: Earle Gell M.D.   On: 04/02/2018 09:47   Dg Chest Port 1 View  Result Date: 03/31/2018 CLINICAL DATA:  Status post right thoracentesis.  Smoker. EXAM: PORTABLE CHEST 1 VIEW COMPARISON:  03/31/2018. FINDINGS: Decreased size of a large right pleural effusion with less adjacent atelectasis. No pneumothorax. Clear left lung. The left lung remains hyperexpanded. Stable enlarged cardiac silhouette, post CABG changes and coronary artery stent. Mild thoracic spine and right shoulder degenerative changes. IMPRESSION: 1. Decreased size of a large right pleural effusion following thoracentesis with less associated atelectasis. 2. No pneumothorax. 3. Stable cardiomegaly and changes of COPD. Electronically Signed   By: Claudie Revering M.D.   On: 03/31/2018 15:38      IMPRESSION: Small cell lung cancer, staging pending Brain MRI and PET scan  Patient will proceed with completion of his staging workup to determine if he will benefit from radiation therapy. At this point he doesn't have any symptoms to palliate. Will proceed with systemic chemotherapy  next week and if disease appears limited to the chest, we will consider chest radiation therapy after his 2nd or 3rd cycle of chemotherapy.    PLAN: Radiation therapy recommendations pending completion of MRI and PET scan.     ------------------------------------------------  Blair Promise, PhD, MD   This document serves as a record of services personally performed by Gery Pray, MD. It was created on his behalf by [facility], a trained medical scribe. The creation of this record is based on the scribe's personal observations and the provider's statements to them. This document has been checked and approved by the attending provider.

## 2018-04-10 NOTE — Therapy (Signed)
[place], [place], [address] Phone: [phone]   Fax:  [phone]  Physical Therapy Evaluation  Patient Details  Name: Christopher Wilson MRN: [id number] Date of Birth: Jun 21, 1941 Referring Provider: Dr. Curt Bears   Encounter Date: 04/10/2018  PT End of Session - 04/10/18 1710    Visit Number  1    Number of Visits  1    PT Start Time  2703    PT Stop Time  1655    PT Time Calculation (min)  22 min    Activity Tolerance  Patient tolerated treatment well    Behavior During Therapy  [facility] for tasks assessed/performed       Past Medical History:  Diagnosis Date  . ACS (acute coronary syndrome), with ST elevation but stable coronary arteries on cath 06/27/12 06/27/2012  . CAD (coronary artery disease), with CABG 1989 and re-do Cabg 2002 X 3 vesssels.  progressive disease with Stent to Lt. Main, and 1st diag 2006, stent to VG to LCX 2007, with known occlusion history o  06/27/2012  . CKD (chronic kidney disease) stage 3, GFR 30-59 ml/min (HCC) 06/27/2012  . 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 Coronary artery disease   . GERD (gastroesophageal reflux disease)   . Hyperlipidemia LDL goal < 70 06/27/2012  . Hypertension   . Myocardial infarction ([place])   . Peptic ulcer   . STEMI (ST elevation myocardial infarction) ([place]) 06/27/2012  . Tobacco abuse 06/27/2012    Past Surgical History:  Procedure Laterality Date  . coratid arterty surgery    . CORONARY ANGIOPLASTY WITH STENT PLACEMENT    . CORONARY ARTERY BYPASS GRAFT    . IR GUIDED DRAIN W CATHETER PLACEMENT  04/08/2018  . LCEA    . LEFT HEART CATH N/A 06/27/2012   Procedure: LEFT HEART CATH;  Surgeon: Troy Sine, MD;  Location: [facility] CATH LAB;  Service: Cardiovascular;  Laterality: N/A;  . triple bipass     Hx of CABG and RE do  . VASCULAR SURGERY    . VIDEO BRONCHOSCOPY Bilateral 04/07/2018   Procedure: VIDEO BRONCHOSCOPY WITHOUT FLUORO;   Surgeon: Rush Farmer, MD;  Location: [facility] ENDOSCOPY;  Service: Cardiopulmonary;  Laterality: Bilateral;    There were no vitals filed for this visit.   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 Says his blood pressure has been low lately.    Patient is accompained by:  Family member daughter    Pertinent History  Pt. presented to [facility] with dyspnea x 1.5 weeks with weight loss. Later came to [facility] ED and was admitted for pleural effusion. He was in the hospital about a week and was discharged yesterday. Had PleurX catheter placed. Smoker, stage 3 kidney disease, Rt. pleural effusion.    Patient Stated Goals  get info from all lung clinic providers    Currently in Pain?  Yes    Pain Score  3     Pain Location  Flank site of PleurX catheter    Pain Orientation  Right    Pain Descriptors / Indicators  Sore    Pain Type  Surgical pain    Aggravating Factors   certain movements    Pain Relieving Factors  sitting still         [facility] PT Assessment - 04/10/18 0001      Assessment  Medical Diagnosis  right middle lobe small cell carcinoma, right pleural effusion    Referring Provider  Dr. Curt Bears    Onset Date/Surgical Date  03/24/18    Hand Dominance  Right    Prior Therapy  getting home health, [facility], for PleurX tube drainage      Precautions   Precautions  Fall;Other (comment)    Precaution Comments  cancer precautions      Restrictions   Weight Bearing Restrictions  No      Balance Screen   Has the patient fallen in the past 6 months  No    Has the patient had a decrease in activity level because of a fear of falling?   No    Is the patient reluctant to leave their home because of a fear of falling?   No      Home Environment   Living Environment  Private residence    Living Arrangements  Alone    Type of Home  Apartment condo, but currently living at  [place] to enter    Entrance Stairs-Number of Steps  2    [place]  One level      Prior Function   Level of Independence  Independent    Vocation  Full time employment wants to go back part time    [employer]  truck driver, local    Leisure  no regular exercise; in the past, he walked 30 minutes on a treadmill      Cognition   Overall Cognitive Status  Within Functional Limits for tasks assessed      Observation/Other Assessments   Observations  thin older gentleman who looks very pale today, sitting in a wheelchair (not his own and used today at his daughter's insistence and due to fatigue)      Coordination   Gross Motor Movements are Fluid and Coordinated  Yes      Functional Tests   Functional tests  Sit to Stand      Sit to Stand   Comments  8 times in <30 seconds; had to stop early due to fatigue and moderate dyspnea      Posture/Postural Control   Posture/Postural Control  Postural limitations    Postural Limitations  Forward head;Rounded Shoulders      ROM / Strength   AROM / PROM / Strength  AROM      AROM   Overall AROM Comments  trunk AROM in standing: reaches fingertips 6 inches to floor in flexion, extension 25% loss, rotation 10% loss with some discomfort at PleurX tube going to right only      Ambulation/Gait   Ambulation/Gait  Yes    Ambulation/Gait Assistance  6: Modified independent (Device/Increase time)    Gait Comments  Walked just 10 feet for demonstration today; appeared mildly unsteady with no assistive device       Balance   Balance Assessed  Yes      Dynamic Standing Balance   Dynamic Standing - Comments  reached forward about 11 inches in standing, below average for age                Objective measurements completed on examination: See above findings.              PT Education - 04/10/18 1709    Education Details  energy conservation, walking, CURE article on staying  active during treatment, "Why exercise?" flyer, posture,  breathing, PT info    Person(s) Educated  Patient;Child(ren)    Methods  Explanation;Handout    Comprehension  Verbalized understanding            Lung Clinic Goals - 04/10/18 1715      Patient will be able to verbalize understanding of the benefit of exercise to decrease fatigue.   Status  Achieved      Patient will be able to verbalize the importance of posture.   Status  Achieved      Patient will be able to demonstrate diaphragmatic breathing for improved lung function.   Status  Achieved      Patient will be able to verbalize understanding of the role of physical therapy to prevent functional decline and who to contact if physical therapy is needed.   Status  Achieved           Plan - 04/10/18 1710    Clinical Impression Statement  This is a gentleman just discharged from the hospital yesterday, after being admitted for pleural effusion with PleurX catheter placed. He was diagnosed with small cell lung cancer and now expects to be treated with chemotherapy. He is quite pale today and appears mildly unsteady on his feet; he also c/o fatigue. He has several limitations noted on assessment today, including poor posture, below average forward reach in standing and 30 second sit to stand, unsteadiness on feet and some pain with movement from PleurX tube.    History and Personal Factors relevant to plan of care:  right pleural effusion, recent low blood pressure per his report, recent PleurX catheter placement    Clinical Presentation  Evolving    Clinical Presentation due to:  just discharged from hospital, new diagnosis of lung cancer and should start treatment soon    Clinical Decision Making  Moderate    Rehab Potential  Good    PT Frequency  One time visit    PT Treatment/Interventions  Patient/family education    PT Next Visit Plan  No follow-up planned, though patient may benefit from therapy should fatigue  continue to be an issue with him    PT Home Exercise Plan  progressive walking program, breathing exercise    Consulted and Agree with Plan of Care  Patient       Patient will benefit from skilled therapeutic intervention in order to improve the following deficits and impairments:  Decreased balance, Decreased activity tolerance, Cardiopulmonary status limiting activity, Postural dysfunction, Decreased range of motion, Decreased mobility  Visit Diagnosis: Unsteadiness on feet - Plan: PT plan of care cert/re-cert  Abnormal posture - Plan: PT plan of care cert/re-cert  Other abnormalities of gait and mobility - Plan: PT plan of care cert/re-cert  Small cell lung cancer ([place]) - Plan: PT plan of care cert/re-cert     Problem List Patient Active Problem List   Diagnosis Date Noted  . 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 Encounter for antineoplastic chemotherapy 04/10/2018  . Goals of care, counseling/discussion 04/10/2018  . Cancer, neuroendocrine, poorly differentiated ([place])   . Lung mass   . Small cell lung cancer ([place])   . Hypoxemia   . Pleural effusion   . Muscular deconditioning   . Dyspnea on exertion   . Shortness of breath   . History of thoracentesis   . Mass of middle lobe of right lung   . History of coronary artery bypass graft   . Chronic renal insufficiency, stage  4 (severe) ([place])   . Pleural effusion on right 03/31/2018  . Essential hypertension 07/08/2015  . Hypotension 01/24/2014  . ACS (acute coronary syndrome), with ST elevation but stable coronary arteries on cath 06/27/12 06/27/2012  . CAD (coronary artery disease) of artery bypass graft 06/27/2012  . CAD (coronary artery disease), with CABG 1989 and re-do Cabg 2002 X 3 vesssels.  progressive disease with Stent to Lt. Main, and 1st diag 2006, stent to VG to LCX 2007, with known occlusion history o  06/27/2012  . Unilateral congenital absence of kidney 06/27/2012  . CKD  (chronic kidney disease) stage 3, GFR 30-59 ml/min (HCC) 06/27/2012  . Hyperlipidemia with target LDL less than 70 06/27/2012  . Tobacco abuse 06/27/2012    SALISBURY,DONNA 04/10/2018, 5:17 PM  [place], [place], [address] Phone: [phone]   Fax:  [phone]  Name: Christopher Wilson MRN: [id number] Date of Birth: 10/13/1941  Serafina Royals, PT 04/10/18 5:17 PM

## 2018-04-11 ENCOUNTER — Encounter: Payer: Self-pay | Admitting: Internal Medicine

## 2018-04-11 ENCOUNTER — Encounter: Payer: Self-pay | Admitting: *Deleted

## 2018-04-11 ENCOUNTER — Other Ambulatory Visit: Payer: Medicare HMO

## 2018-04-11 ENCOUNTER — Telehealth: Payer: Self-pay | Admitting: Internal Medicine

## 2018-04-11 ENCOUNTER — Telehealth: Payer: Self-pay | Admitting: *Deleted

## 2018-04-11 DIAGNOSIS — I2510 Atherosclerotic heart disease of native coronary artery without angina pectoris: Secondary | ICD-10-CM | POA: Diagnosis not present

## 2018-04-11 DIAGNOSIS — I252 Old myocardial infarction: Secondary | ICD-10-CM | POA: Diagnosis not present

## 2018-04-11 DIAGNOSIS — C342 Malignant neoplasm of middle lobe, bronchus or lung: Secondary | ICD-10-CM | POA: Diagnosis not present

## 2018-04-11 DIAGNOSIS — C7A1 Malignant poorly differentiated neuroendocrine tumors: Secondary | ICD-10-CM | POA: Diagnosis not present

## 2018-04-11 DIAGNOSIS — I129 Hypertensive chronic kidney disease with stage 1 through stage 4 chronic kidney disease, or unspecified chronic kidney disease: Secondary | ICD-10-CM | POA: Diagnosis not present

## 2018-04-11 DIAGNOSIS — J910 Malignant pleural effusion: Secondary | ICD-10-CM | POA: Diagnosis not present

## 2018-04-11 DIAGNOSIS — N184 Chronic kidney disease, stage 4 (severe): Secondary | ICD-10-CM | POA: Diagnosis not present

## 2018-04-11 DIAGNOSIS — K279 Peptic ulcer, site unspecified, unspecified as acute or chronic, without hemorrhage or perforation: Secondary | ICD-10-CM | POA: Diagnosis not present

## 2018-04-11 DIAGNOSIS — Z4682 Encounter for fitting and adjustment of non-vascular catheter: Secondary | ICD-10-CM | POA: Diagnosis not present

## 2018-04-11 NOTE — Progress Notes (Signed)
  Oncology Nurse Navigator Documentation  Navigator Location: [facility] (04/10/18 1700)   )Navigator Encounter Type: Initial MedOnc (04/10/18 1700)               Multidisiplinary Clinic Date: 04/10/18 (04/10/18 1700) Multidisiplinary Clinic Type: Thoracic (04/10/18 1700)   Patient Visit Type: MedOnc;RadOnc;Initial (04/10/18 1700) Treatment Phase: Pre-Tx/Tx Discussion (04/10/18 1700) Barriers/Navigation Needs: Coordination of Care (04/10/18 1700)   Interventions: Coordination of Care (04/10/18 1700)            Acuity: Level 2 (04/10/18 1700)   Acuity Level 2: Initial guidance, education and coordination as needed;Educational needs;Assistance expediting appointments;Ongoing guidance and education throughout treatment as needed (04/10/18 1700)  Patient seen in [facility].  Plan of care reviewed with patient as well as [place] resources.  Patient given contact information for Thoracic Navigator.  Patient's questions answered.   Time Spent with Patient: 120 (04/10/18 1700)

## 2018-04-11 NOTE — Telephone Encounter (Signed)
Per 6/13 los - Unable to schedule appt due to capped day - left message for patient to at least set up chemo edu  Class.

## 2018-04-11 NOTE — Telephone Encounter (Signed)
FYI Voicemail received [facility] reporting chest PleuRx drainage today = 900 cc's, very bloody fluid.  Patient coughing, c/o nausea after drained.  First time nausea associated with PleuRx care.  1000 cc draIned yesterday.  Back tomorrow with daughter, she will drain tomorrow.  Voicemail forwarded to collabortaive.

## 2018-04-12 DIAGNOSIS — N184 Chronic kidney disease, stage 4 (severe): Secondary | ICD-10-CM | POA: Diagnosis not present

## 2018-04-12 DIAGNOSIS — Z4682 Encounter for fitting and adjustment of non-vascular catheter: Secondary | ICD-10-CM | POA: Diagnosis not present

## 2018-04-12 DIAGNOSIS — I2510 Atherosclerotic heart disease of native coronary artery without angina pectoris: Secondary | ICD-10-CM | POA: Diagnosis not present

## 2018-04-12 DIAGNOSIS — J910 Malignant pleural effusion: Secondary | ICD-10-CM | POA: Diagnosis not present

## 2018-04-12 DIAGNOSIS — C7A1 Malignant poorly differentiated neuroendocrine tumors: Secondary | ICD-10-CM | POA: Diagnosis not present

## 2018-04-12 DIAGNOSIS — K279 Peptic ulcer, site unspecified, unspecified as acute or chronic, without hemorrhage or perforation: Secondary | ICD-10-CM | POA: Diagnosis not present

## 2018-04-12 DIAGNOSIS — I129 Hypertensive chronic kidney disease with stage 1 through stage 4 chronic kidney disease, or unspecified chronic kidney disease: Secondary | ICD-10-CM | POA: Diagnosis not present

## 2018-04-12 DIAGNOSIS — C342 Malignant neoplasm of middle lobe, bronchus or lung: Secondary | ICD-10-CM | POA: Diagnosis not present

## 2018-04-12 DIAGNOSIS — I252 Old myocardial infarction: Secondary | ICD-10-CM | POA: Diagnosis not present

## 2018-04-14 ENCOUNTER — Telehealth: Payer: Self-pay | Admitting: Internal Medicine

## 2018-04-14 ENCOUNTER — Telehealth: Payer: Self-pay | Admitting: Medical Oncology

## 2018-04-14 ENCOUNTER — Inpatient Hospital Stay: Payer: Medicare HMO

## 2018-04-14 DIAGNOSIS — I2510 Atherosclerotic heart disease of native coronary artery without angina pectoris: Secondary | ICD-10-CM | POA: Diagnosis not present

## 2018-04-14 DIAGNOSIS — K279 Peptic ulcer, site unspecified, unspecified as acute or chronic, without hemorrhage or perforation: Secondary | ICD-10-CM | POA: Diagnosis not present

## 2018-04-14 DIAGNOSIS — C342 Malignant neoplasm of middle lobe, bronchus or lung: Secondary | ICD-10-CM | POA: Diagnosis not present

## 2018-04-14 DIAGNOSIS — I252 Old myocardial infarction: Secondary | ICD-10-CM | POA: Diagnosis not present

## 2018-04-14 DIAGNOSIS — J910 Malignant pleural effusion: Secondary | ICD-10-CM | POA: Diagnosis not present

## 2018-04-14 DIAGNOSIS — Z4682 Encounter for fitting and adjustment of non-vascular catheter: Secondary | ICD-10-CM | POA: Diagnosis not present

## 2018-04-14 DIAGNOSIS — N184 Chronic kidney disease, stage 4 (severe): Secondary | ICD-10-CM | POA: Diagnosis not present

## 2018-04-14 DIAGNOSIS — I129 Hypertensive chronic kidney disease with stage 1 through stage 4 chronic kidney disease, or unspecified chronic kidney disease: Secondary | ICD-10-CM | POA: Diagnosis not present

## 2018-04-14 DIAGNOSIS — C7A1 Malignant poorly differentiated neuroendocrine tumors: Secondary | ICD-10-CM | POA: Diagnosis not present

## 2018-04-14 NOTE — Telephone Encounter (Signed)
Scheduled appt per 6/13 los - patient to get an updated schedule in the chemo edu class.

## 2018-04-14 NOTE — Telephone Encounter (Signed)
Pt pleurix drained of 100 ml today -pt felt better. HTey will go back out on Wednesday to evaluate need for drainage.

## 2018-04-14 NOTE — Telephone Encounter (Signed)
Did not schedule inj appts yet due to no insurance approval per Darlena.

## 2018-04-14 NOTE — Telephone Encounter (Signed)
How often does pleurix need to be drained? Friday , 900 ml drained ,  sat -drained of 650 ml - . Sunday -no visit. I left visit for Anderson Malta to return my call after she sees pt today .

## 2018-04-15 ENCOUNTER — Inpatient Hospital Stay: Payer: Medicare HMO

## 2018-04-15 ENCOUNTER — Other Ambulatory Visit: Payer: Self-pay | Admitting: Oncology

## 2018-04-15 ENCOUNTER — Other Ambulatory Visit: Payer: Self-pay | Admitting: Hematology

## 2018-04-15 VITALS — BP 106/61 | HR 72 | Temp 98.6°F | Resp 18

## 2018-04-15 DIAGNOSIS — Z5111 Encounter for antineoplastic chemotherapy: Secondary | ICD-10-CM | POA: Diagnosis not present

## 2018-04-15 DIAGNOSIS — Z5112 Encounter for antineoplastic immunotherapy: Secondary | ICD-10-CM | POA: Diagnosis not present

## 2018-04-15 DIAGNOSIS — F419 Anxiety disorder, unspecified: Secondary | ICD-10-CM | POA: Diagnosis not present

## 2018-04-15 DIAGNOSIS — C3411 Malignant neoplasm of upper lobe, right bronchus or lung: Secondary | ICD-10-CM

## 2018-04-15 DIAGNOSIS — C342 Malignant neoplasm of middle lobe, bronchus or lung: Secondary | ICD-10-CM | POA: Diagnosis not present

## 2018-04-15 DIAGNOSIS — R5382 Chronic fatigue, unspecified: Secondary | ICD-10-CM

## 2018-04-15 DIAGNOSIS — I10 Essential (primary) hypertension: Secondary | ICD-10-CM | POA: Diagnosis not present

## 2018-04-15 DIAGNOSIS — J910 Malignant pleural effusion: Secondary | ICD-10-CM | POA: Diagnosis not present

## 2018-04-15 LAB — CMP (CANCER CENTER ONLY)
ALT: 14 U/L (ref 0–55)
AST: 20 U/L (ref 5–34)
Albumin: 2 g/dL — ABNORMAL LOW (ref 3.5–5.0)
Alkaline Phosphatase: 74 U/L (ref 40–150)
Anion gap: 7 (ref 3–11)
BUN: 35 mg/dL — ABNORMAL HIGH (ref 7–26)
CHLORIDE: 106 mmol/L (ref 98–109)
CO2: 24 mmol/L (ref 22–29)
CREATININE: 1.9 mg/dL — AB (ref 0.70–1.30)
Calcium: 9.1 mg/dL (ref 8.4–10.4)
GFR, EST AFRICAN AMERICAN: 38 mL/min — AB (ref >60)
GFR, EST NON AFRICAN AMERICAN: 33 mL/min — AB (ref >60)
Glucose, Bld: 144 mg/dL — ABNORMAL HIGH (ref 70–140)
Potassium: 5 mmol/L (ref 3.5–5.1)
SODIUM: 137 mmol/L (ref 136–145)
Total Bilirubin: 0.4 mg/dL (ref 0.2–1.2)
Total Protein: 6 g/dL — ABNORMAL LOW (ref 6.4–8.3)

## 2018-04-15 LAB — CBC WITH DIFFERENTIAL (CANCER CENTER ONLY)
Basophils Absolute: 0.1 10*3/uL (ref 0.0–0.1)
Basophils Relative: 1 %
EOS ABS: 0.1 10*3/uL (ref 0.0–0.5)
EOS PCT: 2 %
HCT: 28.3 % — ABNORMAL LOW (ref 38.4–49.9)
Hemoglobin: 9.2 g/dL — ABNORMAL LOW (ref 13.0–17.1)
LYMPHS ABS: 0.6 10*3/uL — AB (ref 0.9–3.3)
LYMPHS PCT: 10 %
MCH: 29.4 pg (ref 27.2–33.4)
MCHC: 32.4 g/dL (ref 32.0–36.0)
MCV: 90.6 fL (ref 79.3–98.0)
Monocytes Absolute: 0.5 10*3/uL (ref 0.1–0.9)
Monocytes Relative: 7 %
Neutro Abs: 5.1 10*3/uL (ref 1.5–6.5)
Neutrophils Relative %: 80 %
PLATELETS: 356 10*3/uL (ref 140–400)
RBC: 3.12 MIL/uL — AB (ref 4.20–5.82)
RDW: 14.9 % — ABNORMAL HIGH (ref 11.0–14.6)
WBC Count: 6.4 10*3/uL (ref 4.0–10.3)

## 2018-04-15 LAB — TSH: TSH: 1.092 u[IU]/mL (ref 0.320–4.118)

## 2018-04-15 MED ORDER — SODIUM CHLORIDE 0.9 % IV SOLN
75.0000 mg/m2 | Freq: Once | INTRAVENOUS | Status: AC
  Administered 2018-04-15: 140 mg via INTRAVENOUS
  Filled 2018-04-15: qty 7

## 2018-04-15 MED ORDER — PALONOSETRON HCL INJECTION 0.25 MG/5ML
0.2500 mg | Freq: Once | INTRAVENOUS | Status: AC
  Administered 2018-04-15: 0.25 mg via INTRAVENOUS

## 2018-04-15 MED ORDER — ATEZOLIZUMAB CHEMO INJECTION 1200 MG/20ML
1200.0000 mg | Freq: Once | INTRAVENOUS | Status: AC
  Administered 2018-04-15: 1200 mg via INTRAVENOUS
  Filled 2018-04-15: qty 20

## 2018-04-15 MED ORDER — SODIUM CHLORIDE 0.9 % IV SOLN
Freq: Once | INTRAVENOUS | Status: AC
  Administered 2018-04-15: 15:00:00 via INTRAVENOUS

## 2018-04-15 MED ORDER — SODIUM CHLORIDE 0.9 % IV SOLN
290.0000 mg | Freq: Once | INTRAVENOUS | Status: AC
  Administered 2018-04-15: 290 mg via INTRAVENOUS
  Filled 2018-04-15: qty 29

## 2018-04-15 MED ORDER — SODIUM CHLORIDE 0.9 % IV SOLN
Freq: Once | INTRAVENOUS | Status: AC
  Administered 2018-04-15: 15:00:00 via INTRAVENOUS
  Filled 2018-04-15: qty 5

## 2018-04-15 MED ORDER — PALONOSETRON HCL INJECTION 0.25 MG/5ML
INTRAVENOUS | Status: AC
  Filled 2018-04-15: qty 5

## 2018-04-15 NOTE — Progress Notes (Signed)
Scr 1.9, CrCl 33. Dr. Benay Spice is on- call. Will reduce Etoposide to 75mg /m2 due to renal impairment for cycle 1.  Hardie Pulley, PharmD, BCPS, BCOP

## 2018-04-15 NOTE — Progress Notes (Signed)
Ok to treat with 04/15/18 creatinine result per Dr. Benay Spice.  Discharge instructions printed and verbally reviewed. Patient and daughter verbalized understanding. Denies any questions or concerns at this time.

## 2018-04-15 NOTE — Patient Instructions (Signed)
[place] Discharge Instructions for Patients Receiving Chemotherapy  Today you received the following chemotherapy agents Tecentriq, Carboplatin, Etoposide  To help prevent nausea and vomiting after your treatment, we encourage you to take your nausea medication as directed    If you develop nausea and vomiting that is not controlled by your nausea medication, call the clinic.   BELOW ARE SYMPTOMS THAT SHOULD BE REPORTED IMMEDIATELY:  *FEVER GREATER THAN 100.5 F  *CHILLS WITH OR WITHOUT FEVER  NAUSEA AND VOMITING THAT IS NOT CONTROLLED WITH YOUR NAUSEA MEDICATION  *UNUSUAL SHORTNESS OF BREATH  *UNUSUAL BRUISING OR BLEEDING  TENDERNESS IN MOUTH AND THROAT WITH OR WITHOUT PRESENCE OF ULCERS  *URINARY PROBLEMS  *BOWEL PROBLEMS  UNUSUAL RASH Items with * indicate a potential emergency and should be followed up as soon as possible.  Feel free to call the clinic should you have any questions or concerns. The clinic phone number is [phone].  Please show the [facility] at check-in to the Emergency Department and triage nurse.  Atezolizumab injection What is this medicine? ATEZOLIZUMAB (a te zoe LIZ ue mab) is a monoclonal antibody. It is used to treat bladder cancer (urothelial cancer) and non-small cell lung cancer. This medicine may be used for other purposes; ask your health care provider or pharmacist if you have questions. COMMON BRAND NAME(S): Tecentriq What should I tell my health care provider before I take this medicine? They need to know if you have any of these conditions: -diabetes -immune system problems -infection -inflammatory bowel disease -liver disease -lung or breathing disease -lupus -nervous system problems like myasthenia gravis or Guillain-Barre syndrome -organ transplant -an unusual or allergic reaction to atezolizumab, other medicines, foods, dyes, or preservatives -pregnant or trying to get  pregnant -breast-feeding How should I use this medicine? This medicine is for infusion into a vein. It is given by a health care professional in a hospital or clinic setting. A special MedGuide will be given to you before each treatment. Be sure to read this information carefully each time. Talk to your pediatrician regarding the use of this medicine in children. Special care may be needed. Overdosage: If you think you have taken too much of this medicine contact a poison control center or emergency room at once. NOTE: This medicine is only for you. Do not share this medicine with others. What if I miss a dose? It is important not to miss your dose. Call your doctor or health care professional if you are unable to keep an appointment. What may interact with this medicine? Interactions have not been studied. This list may not describe all possible interactions. Give your health care provider a list of all the medicines, herbs, non-prescription drugs, or dietary supplements you use. Also tell them if you smoke, drink alcohol, or use illegal drugs. Some items may interact with your medicine. What should I watch for while using this medicine? Your condition will be monitored carefully while you are receiving this medicine. You may need blood work done while you are taking this medicine. Do not become pregnant while taking this medicine or for at least 5 months after stopping it. Women should inform their doctor if they wish to become pregnant or think they might be pregnant. There is a potential for serious side effects to an unborn child. Talk to your health care professional or pharmacist for more information. Do not breast-feed an infant while taking this medicine or for at least 5 months after the last  dose. What side effects may I notice from receiving this medicine? Side effects that you should report to your doctor or health care professional as soon as possible: -allergic reactions like skin  rash, itching or hives, swelling of the face, lips, or tongue -black, tarry stools -bloody or watery diarrhea -breathing problems -changes in vision -chest pain or chest tightness -chills -facial flushing -fever -headache -signs and symptoms of high blood sugar such as dizziness; dry mouth; dry skin; fruity breath; nausea; stomach pain; increased hunger or thirst; increased urination -signs and symptoms of liver injury like dark yellow or brown urine; general ill feeling or flu-like symptoms; light-colored stools; loss of appetite; nausea; right upper belly pain; unusually weak or tired; yellowing of the eyes or skin -stomach pain -trouble passing urine or change in the amount of urine Side effects that usually do not require medical attention (report to your doctor or health care professional if they continue or are bothersome): -cough -diarrhea -joint pain -muscle pain -muscle weakness -tiredness -weight loss This list may not describe all possible side effects. Call your doctor for medical advice about side effects. You may report side effects to FDA at 1-800-FDA-1088. Where should I keep my medicine? This drug is given in a hospital or clinic and will not be stored at home. NOTE: This sheet is a summary. It may not cover all possible information. If you have questions about this medicine, talk to your doctor, pharmacist, or health care provider.  2018 Elsevier/Gold Standard (2015-11-16 17:54:14)  Carboplatin injection What is this medicine? CARBOPLATIN (KAR boe pla tin) is a chemotherapy drug. It targets fast dividing cells, like cancer cells, and causes these cells to die. This medicine is used to treat ovarian cancer and many other cancers. This medicine may be used for other purposes; ask your health care provider or pharmacist if you have questions. COMMON BRAND NAME(S): Paraplatin What should I tell my health care provider before I take this medicine? They need to know if  you have any of these conditions: -blood disorders -hearing problems -kidney disease -recent or ongoing radiation therapy -an unusual or allergic reaction to carboplatin, cisplatin, other chemotherapy, other medicines, foods, dyes, or preservatives -pregnant or trying to get pregnant -breast-feeding How should I use this medicine? This drug is usually given as an infusion into a vein. It is administered in a hospital or clinic by a specially trained health care professional. Talk to your pediatrician regarding the use of this medicine in children. Special care may be needed. Overdosage: If you think you have taken too much of this medicine contact a poison control center or emergency room at once. NOTE: This medicine is only for you. Do not share this medicine with others. What if I miss a dose? It is important not to miss a dose. Call your doctor or health care professional if you are unable to keep an appointment. What may interact with this medicine? -medicines for seizures -medicines to increase blood counts like filgrastim, pegfilgrastim, sargramostim -some antibiotics like amikacin, gentamicin, neomycin, streptomycin, tobramycin -vaccines Talk to your doctor or health care professional before taking any of these medicines: -acetaminophen -aspirin -ibuprofen -ketoprofen -naproxen This list may not describe all possible interactions. Give your health care provider a list of all the medicines, herbs, non-prescription drugs, or dietary supplements you use. Also tell them if you smoke, drink alcohol, or use illegal drugs. Some items may interact with your medicine. What should I watch for while using this medicine? Your condition  will be monitored carefully while you are receiving this medicine. You will need important blood work done while you are taking this medicine. This drug may make you feel generally unwell. This is not uncommon, as chemotherapy can affect healthy cells as well  as cancer cells. Report any side effects. Continue your course of treatment even though you feel ill unless your doctor tells you to stop. In some cases, you may be given additional medicines to help with side effects. Follow all directions for their use. Call your doctor or health care professional for advice if you get a fever, chills or sore throat, or other symptoms of a cold or flu. Do not treat yourself. This drug decreases your body's ability to fight infections. Try to avoid being around people who are sick. This medicine may increase your risk to bruise or bleed. Call your doctor or health care professional if you notice any unusual bleeding. Be careful brushing and flossing your teeth or using a toothpick because you may get an infection or bleed more easily. If you have any dental work done, tell your dentist you are receiving this medicine. Avoid taking products that contain aspirin, acetaminophen, ibuprofen, naproxen, or ketoprofen unless instructed by your doctor. These medicines may hide a fever. Do not become pregnant while taking this medicine. Women should inform their doctor if they wish to become pregnant or think they might be pregnant. There is a potential for serious side effects to an unborn child. Talk to your health care professional or pharmacist for more information. Do not breast-feed an infant while taking this medicine. What side effects may I notice from receiving this medicine? Side effects that you should report to your doctor or health care professional as soon as possible: -allergic reactions like skin rash, itching or hives, swelling of the face, lips, or tongue -signs of infection - fever or chills, cough, sore throat, pain or difficulty passing urine -signs of decreased platelets or bleeding - bruising, pinpoint red spots on the skin, black, tarry stools, nosebleeds -signs of decreased red blood cells - unusually weak or tired, fainting spells,  lightheadedness -breathing problems -changes in hearing -changes in vision -chest pain -high blood pressure -low blood counts - This drug may decrease the number of white blood cells, red blood cells and platelets. You may be at increased risk for infections and bleeding. -nausea and vomiting -pain, swelling, redness or irritation at the injection site -pain, tingling, numbness in the hands or feet -problems with balance, talking, walking -trouble passing urine or change in the amount of urine Side effects that usually do not require medical attention (report to your doctor or health care professional if they continue or are bothersome): -hair loss -loss of appetite -metallic taste in the mouth or changes in taste This list may not describe all possible side effects. Call your doctor for medical advice about side effects. You may report side effects to FDA at 1-800-FDA-1088. Where should I keep my medicine? This drug is given in a hospital or clinic and will not be stored at home. NOTE: This sheet is a summary. It may not cover all possible information. If you have questions about this medicine, talk to your doctor, pharmacist, or health care provider.  2018 Elsevier/Gold Standard (2008-01-20 14:38:05)  Etoposide, VP-16 capsules What is this medicine? ETOPOSIDE, VP-16 (e toe POE side) is a chemotherapy drug. It is used to treat small cell lung cancer and other cancers. This medicine may be used for other purposes;  ask your health care provider or pharmacist if you have questions. COMMON BRAND NAME(S): VePesid What should I tell my health care provider before I take this medicine? They need to know if you have any of these conditions: -infection -kidney disease -liver disease -low blood counts, like low white cell, platelet, or red cell counts -an unusual or allergic reaction to etoposide, other medicines, foods, dyes, or preservatives -pregnant or trying to get  pregnant -breast-feeding How should I use this medicine? Take this medicine by mouth with a glass of water. Follow the directions on the prescription label. Do not open, crush, or chew the capsules. It is advisable to wear gloves when handling this medicine. Take your medicine at regular intervals. Do not take it more often than directed. Do not stop taking except on your doctor's advice. Talk to your pediatrician regarding the use of this medicine in children. Special care may be needed. Overdosage: If you think you have taken too much of this medicine contact a poison control center or emergency room at once. NOTE: This medicine is only for you. Do not share this medicine with others. What if I miss a dose? If you miss a dose, take it as soon as you can. If it is almost time for your next dose, take only that dose. Do not take double or extra doses. What may interact with this medicine? -aspirin -certain medications for seizures like carbamazepine, phenobarbital, phenytoin, valproic acid -cyclosporine -levamisole -valproic acid -warfarin This list may not describe all possible interactions. Give your health care provider a list of all the medicines, herbs, non-prescription drugs, or dietary supplements you use. Also tell them if you smoke, drink alcohol, or use illegal drugs. Some items may interact with your medicine. What should I watch for while using this medicine? Visit your doctor for checks on your progress. This drug may make you feel generally unwell. This is not uncommon, as chemotherapy can affect healthy cells as well as cancer cells. Report any side effects. Continue your course of treatment even though you feel ill unless your doctor tells you to stop. In some cases, you may be given additional medicines to help with side effects. Follow all directions for their use. Call your doctor or health care professional for advice if you get a fever, chills or sore throat, or other  symptoms of a cold or flu. Do not treat yourself. This drug decreases your body's ability to fight infections. Try to avoid being around people who are sick. This medicine may increase your risk to bruise or bleed. Call your doctor or health care professional if you notice any unusual bleeding. Talk to your doctor about your risk of cancer. You may be more at risk for certain types of cancers if you take this medicine. Do not become pregnant while taking this medicine or for at least 6 months after stopping it. Women should inform their doctor if they wish to become pregnant or think they might be pregnant. Women of child-bearing potential will need to have a negative pregnancy test before starting this medicine. There is a potential for serious side effects to an unborn child. Talk to your health care professional or pharmacist for more information. Do not breast-feed an infant while taking this medicine. Men must use a latex condom during sexual contact with a woman while taking this medicine and for at least 4 months after stopping it. A latex condom is needed even if you have had a vasectomy. Contact your doctor  right away if your partner becomes pregnant. Do not donate sperm while taking this medicine and for 4 months after you stop taking this medicine. Men should inform their doctors if they wish to father a child. This medicine may lower sperm counts. What side effects may I notice from receiving this medicine? Side effects that you should report to your doctor or health care professional as soon as possible: -allergic reactions like skin rash, itching or hives, swelling of the face, lips, or tongue -low blood counts - this medicine may decrease the number of white blood cells, red blood cells and platelets. You may be at increased risk for infections and bleeding. -signs of infection - fever or chills, cough, sore throat, pain or difficulty passing urine -signs of decreased platelets or bleeding -  bruising, pinpoint red spots on the skin, black, tarry stools, blood in the urine -signs of decreased red blood cells - unusually weak or tired, fainting spells, lightheadedness -breathing problems -changes in vision -mouth or throat sores or ulcers -pain, tingling, numbness in the hands or feet -redness, blistering, peeling or loosening of the skin, including inside the mouth -seizures -vomiting Side effects that usually do not require medical attention (report to your doctor or health care professional if they continue or are bothersome): -change in taste -diarrhea -hair loss -nausea -stomach pain This list may not describe all possible side effects. Call your doctor for medical advice about side effects. You may report side effects to FDA at 1-800-FDA-1088. Where should I keep my medicine? Keep out of the reach of children. Store in a refrigerator between 2 and 8 degrees C (36 and 46 degrees F). Do not freeze. Throw away any unused medicine after the expiration date. NOTE: This sheet is a summary. It may not cover all possible information. If you have questions about this medicine, talk to your doctor, pharmacist, or health care provider.  2018 Elsevier/Gold Standard (2015-10-07 11:49:52)

## 2018-04-16 ENCOUNTER — Ambulatory Visit: Payer: Medicare HMO | Admitting: Family Medicine

## 2018-04-16 ENCOUNTER — Inpatient Hospital Stay: Payer: Medicare HMO

## 2018-04-16 ENCOUNTER — Ambulatory Visit: Payer: Medicare HMO

## 2018-04-16 VITALS — BP 119/68 | HR 80 | Temp 97.7°F | Resp 16 | Wt 162.8 lb

## 2018-04-16 DIAGNOSIS — Z4682 Encounter for fitting and adjustment of non-vascular catheter: Secondary | ICD-10-CM | POA: Diagnosis not present

## 2018-04-16 DIAGNOSIS — N184 Chronic kidney disease, stage 4 (severe): Secondary | ICD-10-CM | POA: Diagnosis not present

## 2018-04-16 DIAGNOSIS — C342 Malignant neoplasm of middle lobe, bronchus or lung: Secondary | ICD-10-CM | POA: Diagnosis not present

## 2018-04-16 DIAGNOSIS — Z5111 Encounter for antineoplastic chemotherapy: Secondary | ICD-10-CM | POA: Diagnosis not present

## 2018-04-16 DIAGNOSIS — I2510 Atherosclerotic heart disease of native coronary artery without angina pectoris: Secondary | ICD-10-CM | POA: Diagnosis not present

## 2018-04-16 DIAGNOSIS — J910 Malignant pleural effusion: Secondary | ICD-10-CM | POA: Diagnosis not present

## 2018-04-16 DIAGNOSIS — C7A1 Malignant poorly differentiated neuroendocrine tumors: Secondary | ICD-10-CM | POA: Diagnosis not present

## 2018-04-16 DIAGNOSIS — Z5112 Encounter for antineoplastic immunotherapy: Secondary | ICD-10-CM | POA: Diagnosis not present

## 2018-04-16 DIAGNOSIS — C3411 Malignant neoplasm of upper lobe, right bronchus or lung: Secondary | ICD-10-CM

## 2018-04-16 DIAGNOSIS — F419 Anxiety disorder, unspecified: Secondary | ICD-10-CM | POA: Diagnosis not present

## 2018-04-16 DIAGNOSIS — I129 Hypertensive chronic kidney disease with stage 1 through stage 4 chronic kidney disease, or unspecified chronic kidney disease: Secondary | ICD-10-CM | POA: Diagnosis not present

## 2018-04-16 DIAGNOSIS — K279 Peptic ulcer, site unspecified, unspecified as acute or chronic, without hemorrhage or perforation: Secondary | ICD-10-CM | POA: Diagnosis not present

## 2018-04-16 DIAGNOSIS — I10 Essential (primary) hypertension: Secondary | ICD-10-CM | POA: Diagnosis not present

## 2018-04-16 DIAGNOSIS — I252 Old myocardial infarction: Secondary | ICD-10-CM | POA: Diagnosis not present

## 2018-04-16 MED ORDER — SODIUM CHLORIDE 0.9 % IV SOLN
Freq: Once | INTRAVENOUS | Status: AC
  Administered 2018-04-16: 12:00:00 via INTRAVENOUS

## 2018-04-16 MED ORDER — DEXAMETHASONE SODIUM PHOSPHATE 10 MG/ML IJ SOLN
10.0000 mg | Freq: Once | INTRAMUSCULAR | Status: AC
  Administered 2018-04-16: 10 mg via INTRAVENOUS

## 2018-04-16 MED ORDER — SODIUM CHLORIDE 0.9 % IV SOLN
75.0000 mg/m2 | Freq: Once | INTRAVENOUS | Status: AC
  Administered 2018-04-16: 140 mg via INTRAVENOUS
  Filled 2018-04-16: qty 7

## 2018-04-16 MED ORDER — DEXAMETHASONE SODIUM PHOSPHATE 10 MG/ML IJ SOLN
INTRAMUSCULAR | Status: AC
  Filled 2018-04-16: qty 1

## 2018-04-16 MED ORDER — DEXAMETHASONE SODIUM PHOSPHATE 100 MG/10ML IJ SOLN
10.0000 mg | Freq: Once | INTRAMUSCULAR | Status: DC
  Filled 2018-04-16: qty 1

## 2018-04-16 NOTE — Patient Instructions (Signed)
[facility] Discharge Instructions for Patients Receiving Chemotherapy  Today you received the following chemotherapy agents etoposide   To help prevent nausea and vomiting after your treatment, we encourage you to take your nausea medication as directed  If you develop nausea and vomiting that is not controlled by your nausea medication, call the clinic.   BELOW ARE SYMPTOMS THAT SHOULD BE REPORTED IMMEDIATELY:  *FEVER GREATER THAN 100.5 F  *CHILLS WITH OR WITHOUT FEVER  NAUSEA AND VOMITING THAT IS NOT CONTROLLED WITH YOUR NAUSEA MEDICATION  *UNUSUAL SHORTNESS OF BREATH  *UNUSUAL BRUISING OR BLEEDING  TENDERNESS IN MOUTH AND THROAT WITH OR WITHOUT PRESENCE OF ULCERS  *URINARY PROBLEMS  *BOWEL PROBLEMS  UNUSUAL RASH Items with * indicate a potential emergency and should be followed up as soon as possible.  Feel free to call the clinic you have any questions or concerns. The clinic phone number is [phone].

## 2018-04-17 ENCOUNTER — Inpatient Hospital Stay: Payer: Medicare HMO

## 2018-04-17 ENCOUNTER — Other Ambulatory Visit: Payer: Self-pay | Admitting: *Deleted

## 2018-04-17 VITALS — BP 123/68 | HR 70 | Temp 97.6°F | Resp 16

## 2018-04-17 DIAGNOSIS — I10 Essential (primary) hypertension: Secondary | ICD-10-CM | POA: Diagnosis not present

## 2018-04-17 DIAGNOSIS — J910 Malignant pleural effusion: Secondary | ICD-10-CM | POA: Diagnosis not present

## 2018-04-17 DIAGNOSIS — F419 Anxiety disorder, unspecified: Secondary | ICD-10-CM | POA: Diagnosis not present

## 2018-04-17 DIAGNOSIS — Z5112 Encounter for antineoplastic immunotherapy: Secondary | ICD-10-CM | POA: Diagnosis not present

## 2018-04-17 DIAGNOSIS — Z5111 Encounter for antineoplastic chemotherapy: Secondary | ICD-10-CM | POA: Diagnosis not present

## 2018-04-17 DIAGNOSIS — C3411 Malignant neoplasm of upper lobe, right bronchus or lung: Secondary | ICD-10-CM

## 2018-04-17 DIAGNOSIS — C342 Malignant neoplasm of middle lobe, bronchus or lung: Secondary | ICD-10-CM | POA: Diagnosis not present

## 2018-04-17 MED ORDER — ETOPOSIDE CHEMO INJECTION 1 GM/50ML
75.0000 mg/m2 | Freq: Once | INTRAVENOUS | Status: AC
  Administered 2018-04-17: 140 mg via INTRAVENOUS
  Filled 2018-04-17: qty 7

## 2018-04-17 MED ORDER — DEXAMETHASONE SODIUM PHOSPHATE 10 MG/ML IJ SOLN
10.0000 mg | Freq: Once | INTRAMUSCULAR | Status: AC
  Administered 2018-04-17: 10 mg via INTRAVENOUS

## 2018-04-17 MED ORDER — SODIUM CHLORIDE 0.9 % IV SOLN
Freq: Once | INTRAVENOUS | Status: AC
  Administered 2018-04-17: 12:00:00 via INTRAVENOUS

## 2018-04-17 MED ORDER — DEXAMETHASONE SODIUM PHOSPHATE 10 MG/ML IJ SOLN
INTRAMUSCULAR | Status: AC
  Filled 2018-04-17: qty 1

## 2018-04-17 NOTE — Patient Instructions (Signed)
[facility] Discharge Instructions for Patients Receiving Chemotherapy  Today you received the following chemotherapy agents:  Etoposide (Vepisid)  To help prevent nausea and vomiting after your treatment, we encourage you to take your nausea medication as prescribed.   If you develop nausea and vomiting that is not controlled by your nausea medication, call the clinic.   BELOW ARE SYMPTOMS THAT SHOULD BE REPORTED IMMEDIATELY:  *FEVER GREATER THAN 100.5 F  *CHILLS WITH OR WITHOUT FEVER  NAUSEA AND VOMITING THAT IS NOT CONTROLLED WITH YOUR NAUSEA MEDICATION  *UNUSUAL SHORTNESS OF BREATH  *UNUSUAL BRUISING OR BLEEDING  TENDERNESS IN MOUTH AND THROAT WITH OR WITHOUT PRESENCE OF ULCERS  *URINARY PROBLEMS  *BOWEL PROBLEMS  UNUSUAL RASH Items with * indicate a potential emergency and should be followed up as soon as possible.  Feel free to call the clinic should you have any questions or concerns. The clinic phone number is [phone].  Please show the [facility] at check-in to the Emergency Department and triage nurse.

## 2018-04-18 ENCOUNTER — Ambulatory Visit (HOSPITAL_COMMUNITY): Admission: RE | Admit: 2018-04-18 | Payer: Medicare HMO | Source: Ambulatory Visit

## 2018-04-18 ENCOUNTER — Inpatient Hospital Stay: Payer: Medicare HMO

## 2018-04-18 DIAGNOSIS — Z5112 Encounter for antineoplastic immunotherapy: Secondary | ICD-10-CM | POA: Diagnosis not present

## 2018-04-18 DIAGNOSIS — C342 Malignant neoplasm of middle lobe, bronchus or lung: Secondary | ICD-10-CM | POA: Diagnosis not present

## 2018-04-18 DIAGNOSIS — J910 Malignant pleural effusion: Secondary | ICD-10-CM | POA: Diagnosis not present

## 2018-04-18 DIAGNOSIS — Z5111 Encounter for antineoplastic chemotherapy: Secondary | ICD-10-CM | POA: Diagnosis not present

## 2018-04-18 DIAGNOSIS — I10 Essential (primary) hypertension: Secondary | ICD-10-CM | POA: Diagnosis not present

## 2018-04-18 DIAGNOSIS — C3411 Malignant neoplasm of upper lobe, right bronchus or lung: Secondary | ICD-10-CM

## 2018-04-18 DIAGNOSIS — F419 Anxiety disorder, unspecified: Secondary | ICD-10-CM | POA: Diagnosis not present

## 2018-04-18 MED ORDER — PEGFILGRASTIM-CBQV 6 MG/0.6ML ~~LOC~~ SOSY
6.0000 mg | PREFILLED_SYRINGE | Freq: Once | SUBCUTANEOUS | Status: AC
  Administered 2018-04-18: 6 mg via SUBCUTANEOUS

## 2018-04-18 NOTE — Patient Instructions (Signed)
Pegfilgrastim injection What is this medicine? PEGFILGRASTIM (PEG fil gra stim) is a long-acting granulocyte colony-stimulating factor that stimulates the growth of neutrophils, a type of white blood cell important in the body's fight against infection. It is used to reduce the incidence of fever and infection in patients with certain types of cancer who are receiving chemotherapy that affects the bone marrow, and to increase survival after being exposed to high doses of radiation. This medicine may be used for other purposes; ask your health care provider or pharmacist if you have questions. COMMON BRAND NAME(S): Neulasta What should I tell my health care provider before I take this medicine? They need to know if you have any of these conditions: -kidney disease -latex allergy -ongoing radiation therapy -sickle cell disease -skin reactions to acrylic adhesives (On-Body Injector only) -an unusual or allergic reaction to pegfilgrastim, filgrastim, other medicines, foods, dyes, or preservatives -pregnant or trying to get pregnant -breast-feeding How should I use this medicine? This medicine is for injection under the skin. If you get this medicine at home, you will be taught how to prepare and give the pre-filled syringe or how to use the On-body Injector. Refer to the patient Instructions for Use for detailed instructions. Use exactly as directed. Tell your healthcare provider immediately if you suspect that the On-body Injector may not have performed as intended or if you suspect the use of the On-body Injector resulted in a missed or partial dose. It is important that you put your used needles and syringes in a special sharps container. Do not put them in a trash can. If you do not have a sharps container, call your pharmacist or healthcare provider to get one. Talk to your pediatrician regarding the use of this medicine in children. While this drug may be prescribed for selected conditions,  precautions do apply. Overdosage: If you think you have taken too much of this medicine contact a poison control center or emergency room at once. NOTE: This medicine is only for you. Do not share this medicine with others. What if I miss a dose? It is important not to miss your dose. Call your doctor or health care professional if you miss your dose. If you miss a dose due to an On-body Injector failure or leakage, a new dose should be administered as soon as possible using a single prefilled syringe for manual use. What may interact with this medicine? Interactions have not been studied. Give your health care provider a list of all the medicines, herbs, non-prescription drugs, or dietary supplements you use. Also tell them if you smoke, drink alcohol, or use illegal drugs. Some items may interact with your medicine. This list may not describe all possible interactions. Give your health care provider a list of all the medicines, herbs, non-prescription drugs, or dietary supplements you use. Also tell them if you smoke, drink alcohol, or use illegal drugs. Some items may interact with your medicine. What should I watch for while using this medicine? You may need blood work done while you are taking this medicine. If you are going to need a MRI, CT scan, or other procedure, tell your doctor that you are using this medicine (On-Body Injector only). What side effects may I notice from receiving this medicine? Side effects that you should report to your doctor or health care professional as soon as possible: -allergic reactions like skin rash, itching or hives, swelling of the face, lips, or tongue -dizziness -fever -pain, redness, or irritation at site   where injected -pinpoint red spots on the skin -red or dark-brown urine -shortness of breath or breathing problems -stomach or side pain, or pain at the shoulder -swelling -tiredness -trouble passing urine or change in the amount of urine Side  effects that usually do not require medical attention (report to your doctor or health care professional if they continue or are bothersome): -bone pain -muscle pain This list may not describe all possible side effects. Call your doctor for medical advice about side effects. You may report side effects to FDA at 1-800-FDA-1088. Where should I keep my medicine? Keep out of the reach of children. Store pre-filled syringes in a refrigerator between 2 and 8 degrees C (36 and 46 degrees F). Do not freeze. Keep in carton to protect from light. Throw away this medicine if it is left out of the refrigerator for more than 48 hours. Throw away any unused medicine after the expiration date. NOTE: This sheet is a summary. It may not cover all possible information. If you have questions about this medicine, talk to your doctor, pharmacist, or health care provider.  2018 Elsevier/Gold Standard (2016-10-11 12:58:03)  

## 2018-04-21 ENCOUNTER — Ambulatory Visit (HOSPITAL_COMMUNITY)
Admission: RE | Admit: 2018-04-21 | Discharge: 2018-04-21 | Disposition: A | Discharge status: Home / Self Care | Payer: Medicare HMO | Source: Ambulatory Visit | Attending: Internal Medicine | Admitting: Internal Medicine

## 2018-04-21 ENCOUNTER — Telehealth: Payer: Self-pay | Admitting: Nurse Practitioner

## 2018-04-21 ENCOUNTER — Encounter: Payer: Self-pay | Admitting: Nurse Practitioner

## 2018-04-21 ENCOUNTER — Inpatient Hospital Stay: Payer: Medicare HMO

## 2018-04-21 ENCOUNTER — Inpatient Hospital Stay (HOSPITAL_BASED_OUTPATIENT_CLINIC_OR_DEPARTMENT_OTHER): Payer: Medicare HMO | Admitting: Nurse Practitioner

## 2018-04-21 VITALS — BP 135/75 | HR 86 | Temp 98.6°F | Resp 18 | Ht 70.0 in | Wt 160.1 lb

## 2018-04-21 DIAGNOSIS — K5730 Diverticulosis of large intestine without perforation or abscess without bleeding: Secondary | ICD-10-CM | POA: Diagnosis not present

## 2018-04-21 DIAGNOSIS — Z5112 Encounter for antineoplastic immunotherapy: Secondary | ICD-10-CM | POA: Diagnosis not present

## 2018-04-21 DIAGNOSIS — C342 Malignant neoplasm of middle lobe, bronchus or lung: Secondary | ICD-10-CM

## 2018-04-21 DIAGNOSIS — I719 Aortic aneurysm of unspecified site, without rupture: Secondary | ICD-10-CM | POA: Diagnosis not present

## 2018-04-21 DIAGNOSIS — I700 Atherosclerosis of aorta: Secondary | ICD-10-CM | POA: Diagnosis not present

## 2018-04-21 DIAGNOSIS — J910 Malignant pleural effusion: Secondary | ICD-10-CM

## 2018-04-21 DIAGNOSIS — N289 Disorder of kidney and ureter, unspecified: Secondary | ICD-10-CM | POA: Diagnosis not present

## 2018-04-21 DIAGNOSIS — J90 Pleural effusion, not elsewhere classified: Secondary | ICD-10-CM

## 2018-04-21 DIAGNOSIS — N200 Calculus of kidney: Secondary | ICD-10-CM | POA: Diagnosis not present

## 2018-04-21 DIAGNOSIS — Z4682 Encounter for fitting and adjustment of non-vascular catheter: Secondary | ICD-10-CM | POA: Diagnosis not present

## 2018-04-21 DIAGNOSIS — J189 Pneumonia, unspecified organism: Secondary | ICD-10-CM | POA: Diagnosis not present

## 2018-04-21 DIAGNOSIS — I129 Hypertensive chronic kidney disease with stage 1 through stage 4 chronic kidney disease, or unspecified chronic kidney disease: Secondary | ICD-10-CM | POA: Diagnosis not present

## 2018-04-21 DIAGNOSIS — C3411 Malignant neoplasm of upper lobe, right bronchus or lung: Secondary | ICD-10-CM | POA: Insufficient documentation

## 2018-04-21 DIAGNOSIS — K279 Peptic ulcer, site unspecified, unspecified as acute or chronic, without hemorrhage or perforation: Secondary | ICD-10-CM | POA: Diagnosis not present

## 2018-04-21 DIAGNOSIS — I2510 Atherosclerotic heart disease of native coronary artery without angina pectoris: Secondary | ICD-10-CM | POA: Diagnosis not present

## 2018-04-21 DIAGNOSIS — I10 Essential (primary) hypertension: Secondary | ICD-10-CM | POA: Diagnosis not present

## 2018-04-21 DIAGNOSIS — F419 Anxiety disorder, unspecified: Secondary | ICD-10-CM | POA: Diagnosis not present

## 2018-04-21 DIAGNOSIS — I714 Abdominal aortic aneurysm, without rupture: Secondary | ICD-10-CM | POA: Diagnosis not present

## 2018-04-21 DIAGNOSIS — Z5111 Encounter for antineoplastic chemotherapy: Secondary | ICD-10-CM | POA: Diagnosis not present

## 2018-04-21 DIAGNOSIS — J439 Emphysema, unspecified: Secondary | ICD-10-CM | POA: Diagnosis not present

## 2018-04-21 DIAGNOSIS — C7A1 Malignant poorly differentiated neuroendocrine tumors: Secondary | ICD-10-CM | POA: Diagnosis not present

## 2018-04-21 DIAGNOSIS — I252 Old myocardial infarction: Secondary | ICD-10-CM | POA: Diagnosis not present

## 2018-04-21 DIAGNOSIS — N184 Chronic kidney disease, stage 4 (severe): Secondary | ICD-10-CM | POA: Diagnosis not present

## 2018-04-21 LAB — CMP (CANCER CENTER ONLY)
ALK PHOS: 93 U/L (ref 40–150)
ALT: 19 U/L (ref 0–55)
AST: 19 U/L (ref 5–34)
Albumin: 2.2 g/dL — ABNORMAL LOW (ref 3.5–5.0)
Anion gap: 7 (ref 3–11)
BUN: 44 mg/dL — AB (ref 7–26)
CALCIUM: 9 mg/dL (ref 8.4–10.4)
CHLORIDE: 107 mmol/L (ref 98–109)
CO2: 25 mmol/L (ref 22–29)
Creatinine: 1.47 mg/dL — ABNORMAL HIGH (ref 0.70–1.30)
GFR, EST NON AFRICAN AMERICAN: 45 mL/min — AB (ref >60)
GFR, Est AFR Am: 52 mL/min — ABNORMAL LOW (ref >60)
Glucose, Bld: 91 mg/dL (ref 70–140)
Potassium: 5.3 mmol/L — ABNORMAL HIGH (ref 3.5–5.1)
SODIUM: 139 mmol/L (ref 136–145)
Total Bilirubin: 0.3 mg/dL (ref 0.2–1.2)
Total Protein: 5.8 g/dL — ABNORMAL LOW (ref 6.4–8.3)

## 2018-04-21 LAB — CBC WITH DIFFERENTIAL (CANCER CENTER ONLY)
Basophils Absolute: 0 10*3/uL (ref 0.0–0.1)
Basophils Relative: 0 %
EOS PCT: 1 %
Eosinophils Absolute: 0.1 10*3/uL (ref 0.0–0.5)
HCT: 29.6 % — ABNORMAL LOW (ref 38.4–49.9)
Hemoglobin: 9.7 g/dL — ABNORMAL LOW (ref 13.0–17.1)
LYMPHS ABS: 0.6 10*3/uL — AB (ref 0.9–3.3)
Lymphocytes Relative: 7 %
MCH: 29.5 pg (ref 27.2–33.4)
MCHC: 32.8 g/dL (ref 32.0–36.0)
MCV: 89.8 fL (ref 79.3–98.0)
MONOS PCT: 0 %
Monocytes Absolute: 0 10*3/uL — ABNORMAL LOW (ref 0.1–0.9)
Neutro Abs: 8.7 10*3/uL — ABNORMAL HIGH (ref 1.5–6.5)
Neutrophils Relative %: 92 %
PLATELETS: 277 10*3/uL (ref 140–400)
RBC: 3.3 MIL/uL — ABNORMAL LOW (ref 4.20–5.82)
RDW: 14.3 % (ref 11.0–14.6)
WBC Count: 9.4 10*3/uL (ref 4.0–10.3)

## 2018-04-21 LAB — GLUCOSE, CAPILLARY: Glucose-Capillary: 91 mg/dL (ref 65–99)

## 2018-04-21 MED ORDER — LORAZEPAM 1 MG PO TABS
ORAL_TABLET | ORAL | Status: AC
  Filled 2018-04-21: qty 1

## 2018-04-21 MED ORDER — LORAZEPAM 0.5 MG PO TABS: 0.5000 mg | ORAL_TABLET | Freq: Once | ORAL | 0 refills | Status: AC

## 2018-04-21 MED ORDER — FLUDEOXYGLUCOSE F - 18 (FDG) INJECTION
8.0000 | Freq: Once | INTRAVENOUS | Status: AC | PRN
  Administered 2018-04-21: 8 via INTRAVENOUS

## 2018-04-21 MED ORDER — LORAZEPAM 1 MG PO TABS
0.5000 mg | ORAL_TABLET | Freq: Once | ORAL | Status: AC
  Administered 2018-04-21: 0.5 mg via ORAL

## 2018-04-21 NOTE — Telephone Encounter (Signed)
Appointment scheduled patient will get updated schedule and next visit per 6/24 los

## 2018-04-21 NOTE — Progress Notes (Addendum)
[place]  [phone] [phone]  Clinic Follow up Note   Patient Care Team: System, Pcp Not In as PCP - General Troy Sine, MD as PCP - Cardiology (Cardiology) 04/21/2018   DIAGNOSIS: extensive stage (T4, N2, M1a) small cell lung cancer presented with large right perihilar mass with mediastinal invasion and loculated malignant right pleural effusion diagnosed in June 2019.  PRIOR THERAPY None  CURRENT THERAPY palliative systemic chemotherapy with carboplatin for AUC of 5 on day 1, etoposide 100 mg/M2 on days 1, 2 and 3 in addition to Tecentriq 1200 mg IV every 3 weeks with Neulasta support.  INTERVAL HISTORY: Christopher Wilson returns for follow-up as scheduled.  Completed cycle 1 carbo/etoposide/Tecentriq from 6/18-6/20 with Udenyca on 6/21.  He feels he tolerated treatment well.  He remains weak but stable, able to perform some ADLs independently.  He has good appetite, denies nausea, vomiting, constipation, or diarrhea.  Takes antiemetics prophylactically twice daily before morning and p.m. meals.  Has intermittent cough with clear sputum is stable, denies chest pain, hemoptysis, or wheezing.  Becomes dyspneic when lying flat. Pleurx catheter continues to be drained, 100 cc output 2 days ago, and 50 cc output today.  Drainage becoming lighter and less bloody.  Denies fever or chills.  No pain with Udenyca.   REVIEW OF SYSTEMS:   Constitutional: Denies fevers, chills or abnormal weight loss Eyes: Denies blurriness of vision Ears, nose, mouth, throat, and face: Denies mucositis or sore throat Respiratory: Denies hemoptysis or wheezes (+) intermittent cough with clear sputum (+) dyspnea when lying flat Cardiovascular: Denies palpitation, chest discomfort (+) bilateral lower extremity swelling, denies calf pain Gastrointestinal:  Denies nausea, vomiting, constipation, diarrhea, heartburn or change in bowel habits Skin: Denies abnormal skin  rashes Lymphatics: Denies new lymphadenopathy or easy bruising Neurological:Denies numbness, tingling(+) generalized weakness, stable Behavioral/Psych: Mood is stable, no new changes  All other systems were reviewed with the patient and are negative.  MEDICAL HISTORY:  Past Medical History:  Diagnosis Date  . ACS (acute coronary syndrome), with ST elevation but stable coronary arteries on cath 06/27/12 06/27/2012  . CAD (coronary artery disease), with CABG 1989 and re-do Cabg 2002 X 3 vesssels.  progressive disease with Stent to Lt. Main, and 1st diag 2006, stent to VG to LCX 2007, with known occlusion history o  06/27/2012  . CKD (chronic kidney disease) stage 3, GFR 30-59 ml/min (HCC) 06/27/2012  . 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 Coronary artery disease   . GERD (gastroesophageal reflux disease)   . Hyperlipidemia LDL goal < 70 06/27/2012  . Hypertension   . Myocardial infarction ([place])   . Peptic ulcer   . STEMI (ST elevation myocardial infarction) ([place]) 06/27/2012  . Tobacco abuse 06/27/2012    SURGICAL HISTORY: Past Surgical History:  Procedure Laterality Date  . coratid arterty surgery    . CORONARY ANGIOPLASTY WITH STENT PLACEMENT    . CORONARY ARTERY BYPASS GRAFT    . IR GUIDED DRAIN W CATHETER PLACEMENT  04/08/2018  . LCEA    . LEFT HEART CATH N/A 06/27/2012   Procedure: LEFT HEART CATH;  Surgeon: Troy Sine, MD;  Location: [facility] CATH LAB;  Service: Cardiovascular;  Laterality: N/A;  . triple bipass     Hx of CABG and RE do  . VASCULAR SURGERY    . VIDEO BRONCHOSCOPY Bilateral 04/07/2018   Procedure: VIDEO BRONCHOSCOPY WITHOUT FLUORO;  Surgeon: Rush Farmer, MD;  Location: [facility] ENDOSCOPY;  Service:  Cardiopulmonary;  Laterality: Bilateral;    I have reviewed the social history and family history with the patient and they are unchanged from previous note.  ALLERGIES:  has No Known Allergies.  MEDICATIONS:  Current Outpatient Medications  Medication Sig Dispense  Refill  . albuterol (PROVENTIL HFA;VENTOLIN HFA) 108 (90 Base) MCG/ACT inhaler Inhale 2 puffs into the lungs every 6 (six) hours as needed for wheezing or shortness of breath.    Marland Kitchen aspirin 81 MG chewable tablet Chew 81 mg by mouth daily.    Marland Kitchen atorvastatin (LIPITOR) 80 MG tablet TAKE ONE TABLET BY MOUTH DAILY AT 6 P.M. NEEDS APPOINTMENT FOR FUTURE REFILLS. 90 tablet 3  . metoprolol tartrate (LOPRESSOR) 25 MG tablet TAKE ONE TABLET BY MOUTH TWICE DAILY (PLEASE  CONTACT  OFFICE  FOR  ADDITIONAL  REFILLS  ) 180 tablet 2  . Probiotic Product (PROBIOTIC-10 PO) Take 1 tablet by mouth daily.    . prochlorperazine (COMPAZINE) 10 MG tablet Take 1 tablet (10 mg total) by mouth every 6 (six) hours as needed for nausea or vomiting. 30 tablet 0  . LORazepam (ATIVAN) 0.5 MG tablet Take 1 tablet (0.5 mg total) by mouth once for 1 dose. Take 30-60 minutes before MRI 1 tablet 0  . nitroGLYCERIN (NITROSTAT) 0.4 MG SL tablet Place 1 tablet (0.4 mg total) under the tongue every 5 (five) minutes as needed for chest pain. (Patient not taking: Reported on 04/21/2018) 25 tablet 3   No current facility-administered medications for this visit.     PHYSICAL EXAMINATION: ECOG PERFORMANCE STATUS: 1-2  Vitals:   04/21/18 1434  BP: 135/75  Pulse: 86  Resp: 18  Temp: 98.6 F (37 C)  SpO2: 95%   Filed Weights   04/21/18 1434  Weight: 160 lb 1.6 oz (72.6 kg)    GENERAL:alert, no distress and comfortable; presents in wheelchair SKIN: no rashes or significant lesions EYES: normal, Conjunctiva are pink and non-injected, sclera clear OROPHARYNX: No thrush or ulcers LYMPH:  no palpable cervical or supraclavicular lymphadenopathy LUNGS: clear to auscultation, decreased throughout with normal breathing effort; no wheezes.  Pleurix dressing C/D/I HEART: regular rate & rhythm; bilateral lower extremity edema ABDOMEN:abdomen soft, non-tender and normal bowel sounds Musculoskeletal:no cyanosis of digits and no clubbing   NEURO: alert & oriented x 3 with fluent speech, no focal motor/sensory deficits  LABORATORY DATA:  I have reviewed the data as listed CBC Latest Ref Rng & Units 04/21/2018 04/15/2018 04/03/2018  WBC 4.0 - 10.3 K/uL 9.4 6.4 6.1  Hemoglobin 13.0 - 17.1 g/dL 9.7(L) 9.2(L) 10.6(L)  Hematocrit 38.4 - 49.9 % 29.6(L) 28.3(L) 33.7(L)  Platelets 140 - 400 K/uL 277 356 262     CMP Latest Ref Rng & Units 04/15/2018 04/07/2018 04/06/2018  Glucose 70 - 140 mg/dL 144(H) 116(H) 111(H)  BUN 7 - 26 mg/dL 35(H) 27(H) 24(H)  Creatinine 0.70 - 1.30 mg/dL 1.90(H) 1.88(H) 1.76(H)  Sodium 136 - 145 mmol/L 137 139 140  Potassium 3.5 - 5.1 mmol/L 5.0 5.0 5.1  Chloride 98 - 109 mmol/L 106 108 109  CO2 22 - 29 mmol/L 24 21(L) 24  Calcium 8.4 - 10.4 mg/dL 9.1 9.0 8.9  Total Protein 6.4 - 8.3 g/dL 6.0(L) - -  Total Bilirubin 0.2 - 1.2 mg/dL 0.4 - -  Alkaline Phos 40 - 150 U/L 74 - -  AST 5 - 34 U/L 20 - -  ALT 0 - 55 U/L 14 - -      RADIOGRAPHIC STUDIES: I have  personally reviewed the radiological images as listed and agreed with the findings in the report. No results found.   ASSESSMENT & PLAN: This is a very pleasant 77 years old white male recently diagnosed with extensive stage (T4, N2, M1a) small cell lung cancer presented with large right perihilar mass with mediastinal invasion and loculated malignant right pleural effusion diagnosed in June 2019.   He completed cycle 1 carboplatin/etoposide/Tecentriq with Udenyca.  He tolerated treatment well overall.  VS, weight, and performance status are stable. Appearance and amount of pleurix output is improving, respiratory status is stable. CBC with moderate but stable anemia, Hgb 9.7.  CMP is pending.  He will return next week for weekly labs. He will undergo staging PET scan today and MRI brain on 04/25/2018.  For mild anxiety I prescribed Ativan 0.5mg  once, 30-60 minutes prior to imaging.  Will get 1 dose here in clinic prior to PET scan as well.  He will return in  2 weeks for lab and follow-up to review staging work-up and evaluation for cycle 2 chemotherapy.  The patient was seen with Dr. Earlie Server.   All questions were answered. The patient knows to call the clinic with any problems, questions or concerns. No barriers to learning was detected.     Alla Feeling, NP 04/21/18   ADDENDUM: Hematology/Oncology Attending: I had a face-to-face encounter with the patient today.  I recommended his care plan.  This is a very pleasant 77 years old white male with extensive stage small cell lung cancer status post first cycle of systemic chemotherapy with carboplatin, etoposide and Tecentriq given last week.  The patient tolerated the first week of his treatment fairly well with no concerning complaints.  He denied having any nausea or vomiting.  He denied having any fever or chills.  He continues to have drainage of the right pleural effusion via the Pleurx catheter. The patient is a scheduled for a PET scan later today as well as MRI of the brain later this week to complete the staging work-up of his disease. I recommended for the patient to continue his treatment and he will come in 2 weeks for evaluation before starting cycle #2. The patient was advised to call immediately if he has any concerning symptoms in the interval.  Disclaimer: This note was dictated with voice recognition software. Similar sounding words can inadvertently be transcribed and may be missed upon review. Eilleen Kempf, MD 04/21/18

## 2018-04-22 ENCOUNTER — Encounter (HOSPITAL_COMMUNITY): Payer: Medicare HMO

## 2018-04-23 ENCOUNTER — Telehealth: Payer: Self-pay

## 2018-04-23 NOTE — Telephone Encounter (Signed)
Nutrition Assessment   Reason for Assessment:   Patient identified on Malnutrition screening tool for weight loss and poor appetite  ASSESSMENT:  77 year old male with lung cancer.  Past medical history of CAD, CKD, GERD, CAD, HLD, HTN, MI, STEMI.  Called patient to introduce self and service.  Patient reports appetite is better and eating better.  "I have gained some weight back."  Drinking boost/ensure shakes (not sure which type).     Medications: reviewed  Labs: reviewed  Anthropometrics:   Height: 70 inches Weight: 160 lb 1.6 oz UBW: 180s, noted last at that weight on 07/17/17 BMI: 22  11% weight loss in the last 9 months   Estimated Energy Needs  Kcals: 2200-2500 calories/d Protein: 110-125 g/d Fluid: 2.5 L/d  NUTRITION DIAGNOSIS: Inadequate oral intake related to cancer, weakness, fatigue as evidenced by 11% weight loss in 9  months   INTERVENTION:  Patient interested in meeting with RD on 7/9 during infusion to review further nutrition strategies Discussed high calorie, high protein oral nutrition supplement options.     MONITORING, EVALUATION, GOAL: Patient will consume adequate calories to prevent further weight loss   NEXT VISIT: July 9 during infusion  Deshanae Lindo B. Zenia Resides, [place], [place] Registered Dietitian [phone] (pager)

## 2018-04-25 ENCOUNTER — Ambulatory Visit (HOSPITAL_COMMUNITY): Payer: Medicare HMO

## 2018-04-25 ENCOUNTER — Encounter (HOSPITAL_COMMUNITY): Payer: Self-pay | Admitting: Radiology

## 2018-04-25 ENCOUNTER — Telehealth: Payer: Self-pay | Admitting: *Deleted

## 2018-04-25 NOTE — Telephone Encounter (Signed)
Pt daughter called request for lasix due to swelling in her feet. Reviewed with MD who advised pt to elevate feet, call PCP ro cardiology MD if wanting rx for lasix. Pt may need further evaluation. Returned call to [employer] with above instructions.

## 2018-04-29 ENCOUNTER — Inpatient Hospital Stay: Payer: Medicare HMO | Attending: Internal Medicine

## 2018-04-29 DIAGNOSIS — C342 Malignant neoplasm of middle lobe, bronchus or lung: Secondary | ICD-10-CM | POA: Insufficient documentation

## 2018-04-29 DIAGNOSIS — Z5111 Encounter for antineoplastic chemotherapy: Secondary | ICD-10-CM | POA: Diagnosis not present

## 2018-04-29 DIAGNOSIS — Z79899 Other long term (current) drug therapy: Secondary | ICD-10-CM | POA: Insufficient documentation

## 2018-04-29 DIAGNOSIS — Z5189 Encounter for other specified aftercare: Secondary | ICD-10-CM | POA: Insufficient documentation

## 2018-04-29 DIAGNOSIS — J910 Malignant pleural effusion: Secondary | ICD-10-CM | POA: Diagnosis not present

## 2018-04-29 DIAGNOSIS — C7A1 Malignant poorly differentiated neuroendocrine tumors: Secondary | ICD-10-CM | POA: Diagnosis not present

## 2018-04-29 DIAGNOSIS — Z5112 Encounter for antineoplastic immunotherapy: Secondary | ICD-10-CM | POA: Insufficient documentation

## 2018-04-29 DIAGNOSIS — I2510 Atherosclerotic heart disease of native coronary artery without angina pectoris: Secondary | ICD-10-CM | POA: Diagnosis not present

## 2018-04-29 DIAGNOSIS — K279 Peptic ulcer, site unspecified, unspecified as acute or chronic, without hemorrhage or perforation: Secondary | ICD-10-CM | POA: Diagnosis not present

## 2018-04-29 DIAGNOSIS — I252 Old myocardial infarction: Secondary | ICD-10-CM | POA: Diagnosis not present

## 2018-04-29 DIAGNOSIS — Z4682 Encounter for fitting and adjustment of non-vascular catheter: Secondary | ICD-10-CM | POA: Diagnosis not present

## 2018-04-29 DIAGNOSIS — C3411 Malignant neoplasm of upper lobe, right bronchus or lung: Secondary | ICD-10-CM

## 2018-04-29 DIAGNOSIS — N184 Chronic kidney disease, stage 4 (severe): Secondary | ICD-10-CM | POA: Diagnosis not present

## 2018-04-29 DIAGNOSIS — I129 Hypertensive chronic kidney disease with stage 1 through stage 4 chronic kidney disease, or unspecified chronic kidney disease: Secondary | ICD-10-CM | POA: Diagnosis not present

## 2018-04-29 LAB — CMP (CANCER CENTER ONLY)
ALBUMIN: 2.4 g/dL — AB (ref 3.5–5.0)
ALT: 17 U/L (ref 0–44)
ANION GAP: 6 (ref 5–15)
AST: 16 U/L (ref 15–41)
Alkaline Phosphatase: 154 U/L — ABNORMAL HIGH (ref 38–126)
BUN: 26 mg/dL — ABNORMAL HIGH (ref 8–23)
CO2: 27 mmol/L (ref 22–32)
Calcium: 9 mg/dL (ref 8.9–10.3)
Chloride: 106 mmol/L (ref 98–111)
Creatinine: 1.72 mg/dL — ABNORMAL HIGH (ref 0.61–1.24)
GFR, Est AFR Am: 43 mL/min — ABNORMAL LOW (ref >60)
GFR, Estimated: 37 mL/min — ABNORMAL LOW (ref >60)
Glucose, Bld: 127 mg/dL — ABNORMAL HIGH (ref 70–99)
POTASSIUM: 4.5 mmol/L (ref 3.5–5.1)
SODIUM: 139 mmol/L (ref 135–145)
TOTAL PROTEIN: 6.1 g/dL — AB (ref 6.5–8.1)

## 2018-04-29 LAB — CBC WITH DIFFERENTIAL (CANCER CENTER ONLY)
BASOS ABS: 0.1 10*3/uL (ref 0.0–0.1)
Basophils Relative: 1 %
Eosinophils Absolute: 0 10*3/uL (ref 0.0–0.5)
Eosinophils Relative: 0 %
HEMATOCRIT: 29.3 % — AB (ref 38.4–49.9)
HEMOGLOBIN: 9.2 g/dL — AB (ref 13.0–17.1)
LYMPHS PCT: 8 %
Lymphs Abs: 1.4 10*3/uL (ref 0.9–3.3)
MCH: 29.1 pg (ref 27.2–33.4)
MCHC: 31.4 g/dL — ABNORMAL LOW (ref 32.0–36.0)
MCV: 92.7 fL (ref 79.3–98.0)
Monocytes Absolute: 1.1 10*3/uL — ABNORMAL HIGH (ref 0.1–0.9)
Monocytes Relative: 6 %
NEUTROS ABS: 14.7 10*3/uL — AB (ref 1.5–6.5)
NEUTROS PCT: 85 %
Platelet Count: 122 10*3/uL — ABNORMAL LOW (ref 140–400)
RBC: 3.16 MIL/uL — ABNORMAL LOW (ref 4.20–5.82)
RDW: 14.7 % — ABNORMAL HIGH (ref 11.0–14.6)
WBC Count: 17.3 10*3/uL — ABNORMAL HIGH (ref 4.0–10.3)

## 2018-04-30 ENCOUNTER — Telehealth: Payer: Self-pay | Admitting: Cardiovascular Disease

## 2018-04-30 MED ORDER — FUROSEMIDE 40 MG PO TABS: ORAL_TABLET | ORAL | 0 refills | Status: DC

## 2018-04-30 NOTE — Telephone Encounter (Signed)
New Message    Patients daughter Clarene Critchley is calling because the patient is having some swelling in his feet. She was advised by oncologist to contact the cardiologist about some lasix. Patient was just diagnosed with lung cancer.

## 2018-04-30 NOTE — Telephone Encounter (Signed)
Returned call to patient/daughter.. His oncologist suggested he call us for a prescription for lasix for swelling. He reports swelling for about 1 week in ankles/feet. He has been trying to keep his legs elevated. He states he avoids salt. He states NO shortness of breath. He has not noticed weight gain - reports weight loss from cancer. Informed him that it may be recommended to have in office evaluation for new symptoms since he is not currently on lasix but patient wishes to have MD review his concerns first.   Spoke with DOD Dr. Ellyn Hack who recommended lasix 40mg  daily for 3 days then use PRN. Rx(s) sent to pharmacy electronically.  MD also suggested early f/up but patient has chemo every day next week. He has been scheduled on 7/16 with PA.

## 2018-05-02 ENCOUNTER — Ambulatory Visit (HOSPITAL_COMMUNITY)
Admission: RE | Admit: 2018-05-02 | Discharge: 2018-05-02 | Disposition: A | Discharge status: Home / Self Care | Payer: Medicare HMO | Source: Ambulatory Visit | Attending: Oncology | Admitting: Oncology

## 2018-05-02 ENCOUNTER — Encounter (HOSPITAL_COMMUNITY): Payer: Self-pay

## 2018-05-02 DIAGNOSIS — C3490 Malignant neoplasm of unspecified part of unspecified bronchus or lung: Secondary | ICD-10-CM | POA: Insufficient documentation

## 2018-05-05 ENCOUNTER — Encounter: Payer: Self-pay | Admitting: Cardiovascular Disease

## 2018-05-06 ENCOUNTER — Encounter: Payer: Self-pay | Admitting: Nurse Practitioner

## 2018-05-06 ENCOUNTER — Inpatient Hospital Stay: Payer: Medicare HMO

## 2018-05-06 ENCOUNTER — Telehealth: Payer: Self-pay | Admitting: Cardiovascular Disease

## 2018-05-06 ENCOUNTER — Inpatient Hospital Stay (HOSPITAL_BASED_OUTPATIENT_CLINIC_OR_DEPARTMENT_OTHER): Payer: Medicare HMO | Admitting: Nurse Practitioner

## 2018-05-06 ENCOUNTER — Telehealth: Payer: Self-pay | Admitting: Nurse Practitioner

## 2018-05-06 ENCOUNTER — Telehealth: Payer: Self-pay

## 2018-05-06 ENCOUNTER — Ambulatory Visit (HOSPITAL_COMMUNITY)
Admission: RE | Admit: 2018-05-06 | Discharge: 2018-05-06 | Disposition: A | Discharge status: Home / Self Care | Payer: Medicare HMO | Source: Ambulatory Visit | Attending: Nurse Practitioner | Admitting: Nurse Practitioner

## 2018-05-06 VITALS — BP 116/71 | HR 100 | Temp 97.7°F | Resp 18 | Ht 70.0 in | Wt 148.3 lb

## 2018-05-06 DIAGNOSIS — I82432 Acute embolism and thrombosis of left popliteal vein: Secondary | ICD-10-CM | POA: Diagnosis not present

## 2018-05-06 DIAGNOSIS — Z5111 Encounter for antineoplastic chemotherapy: Secondary | ICD-10-CM | POA: Diagnosis not present

## 2018-05-06 DIAGNOSIS — I82442 Acute embolism and thrombosis of left tibial vein: Secondary | ICD-10-CM | POA: Insufficient documentation

## 2018-05-06 DIAGNOSIS — R634 Abnormal weight loss: Secondary | ICD-10-CM | POA: Diagnosis not present

## 2018-05-06 DIAGNOSIS — J910 Malignant pleural effusion: Secondary | ICD-10-CM | POA: Diagnosis not present

## 2018-05-06 DIAGNOSIS — C3411 Malignant neoplasm of upper lobe, right bronchus or lung: Secondary | ICD-10-CM

## 2018-05-06 DIAGNOSIS — R600 Localized edema: Secondary | ICD-10-CM | POA: Diagnosis not present

## 2018-05-06 DIAGNOSIS — K5900 Constipation, unspecified: Secondary | ICD-10-CM | POA: Diagnosis not present

## 2018-05-06 DIAGNOSIS — Z5112 Encounter for antineoplastic immunotherapy: Secondary | ICD-10-CM | POA: Diagnosis not present

## 2018-05-06 DIAGNOSIS — C342 Malignant neoplasm of middle lobe, bronchus or lung: Secondary | ICD-10-CM | POA: Diagnosis not present

## 2018-05-06 DIAGNOSIS — I509 Heart failure, unspecified: Secondary | ICD-10-CM

## 2018-05-06 DIAGNOSIS — I82402 Acute embolism and thrombosis of unspecified deep veins of left lower extremity: Secondary | ICD-10-CM | POA: Diagnosis not present

## 2018-05-06 DIAGNOSIS — N184 Chronic kidney disease, stage 4 (severe): Secondary | ICD-10-CM

## 2018-05-06 DIAGNOSIS — Z5189 Encounter for other specified aftercare: Secondary | ICD-10-CM | POA: Diagnosis not present

## 2018-05-06 DIAGNOSIS — R5382 Chronic fatigue, unspecified: Secondary | ICD-10-CM

## 2018-05-06 DIAGNOSIS — Z79899 Other long term (current) drug therapy: Secondary | ICD-10-CM | POA: Diagnosis not present

## 2018-05-06 DIAGNOSIS — I10 Essential (primary) hypertension: Secondary | ICD-10-CM

## 2018-05-06 LAB — CMP (CANCER CENTER ONLY)
ALK PHOS: 123 U/L (ref 38–126)
ALT: 24 U/L (ref 0–44)
AST: 25 U/L (ref 15–41)
Albumin: 2.6 g/dL — ABNORMAL LOW (ref 3.5–5.0)
Anion gap: 8 (ref 5–15)
BILIRUBIN TOTAL: 0.2 mg/dL — AB (ref 0.3–1.2)
BUN: 37 mg/dL — AB (ref 8–23)
CALCIUM: 9.9 mg/dL (ref 8.9–10.3)
CO2: 28 mmol/L (ref 22–32)
CREATININE: 2.31 mg/dL — AB (ref 0.61–1.24)
Chloride: 103 mmol/L (ref 98–111)
GFR, Est AFR Am: 30 mL/min — ABNORMAL LOW (ref >60)
GFR, Estimated: 26 mL/min — ABNORMAL LOW (ref >60)
GLUCOSE: 94 mg/dL (ref 70–99)
Potassium: 4.4 mmol/L (ref 3.5–5.1)
Sodium: 139 mmol/L (ref 135–145)
TOTAL PROTEIN: 7.8 g/dL (ref 6.5–8.1)

## 2018-05-06 LAB — CBC WITH DIFFERENTIAL (CANCER CENTER ONLY)
BASOS ABS: 0.1 10*3/uL (ref 0.0–0.1)
BASOS PCT: 1 %
EOS PCT: 0 %
Eosinophils Absolute: 0 10*3/uL (ref 0.0–0.5)
HCT: 29.3 % — ABNORMAL LOW (ref 38.4–49.9)
Hemoglobin: 9.7 g/dL — ABNORMAL LOW (ref 13.0–17.1)
Lymphocytes Relative: 7 %
Lymphs Abs: 1.1 10*3/uL (ref 0.9–3.3)
MCH: 29.7 pg (ref 27.2–33.4)
MCHC: 33.1 g/dL (ref 32.0–36.0)
MCV: 89.8 fL (ref 79.3–98.0)
MONO ABS: 1.3 10*3/uL — AB (ref 0.1–0.9)
Monocytes Relative: 8 %
Neutro Abs: 13.6 10*3/uL — ABNORMAL HIGH (ref 1.5–6.5)
Neutrophils Relative %: 84 %
PLATELETS: 513 10*3/uL — AB (ref 140–400)
RBC: 3.26 MIL/uL — ABNORMAL LOW (ref 4.20–5.82)
RDW: 15.2 % — AB (ref 11.0–14.6)
WBC Count: 16.2 10*3/uL — ABNORMAL HIGH (ref 4.0–10.3)

## 2018-05-06 LAB — TSH: TSH: 0.898 u[IU]/mL (ref 0.320–4.118)

## 2018-05-06 MED ORDER — SODIUM CHLORIDE 0.9 % IV SOLN
Freq: Once | INTRAVENOUS | Status: AC
  Administered 2018-05-06: 15:00:00 via INTRAVENOUS
  Filled 2018-05-06: qty 5

## 2018-05-06 MED ORDER — SODIUM CHLORIDE 0.9 % IV SOLN
259.5000 mg | Freq: Once | INTRAVENOUS | Status: AC
  Administered 2018-05-06: 260 mg via INTRAVENOUS
  Filled 2018-05-06: qty 26

## 2018-05-06 MED ORDER — PALONOSETRON HCL INJECTION 0.25 MG/5ML
INTRAVENOUS | Status: AC
  Filled 2018-05-06: qty 5

## 2018-05-06 MED ORDER — PALONOSETRON HCL INJECTION 0.25 MG/5ML
0.2500 mg | Freq: Once | INTRAVENOUS | Status: AC
  Administered 2018-05-06: 0.25 mg via INTRAVENOUS

## 2018-05-06 MED ORDER — SODIUM CHLORIDE 0.9 % IV SOLN
75.0000 mg/m2 | Freq: Once | INTRAVENOUS | Status: AC
  Administered 2018-05-06: 140 mg via INTRAVENOUS
  Filled 2018-05-06: qty 7

## 2018-05-06 MED ORDER — SODIUM CHLORIDE 0.9 % IV SOLN: 100.0000 mg/m2 | Freq: Once | INTRAVENOUS | Status: DC

## 2018-05-06 MED ORDER — SODIUM CHLORIDE 0.9 % IV SOLN
Freq: Once | INTRAVENOUS | Status: AC
  Administered 2018-05-06: 15:00:00 via INTRAVENOUS

## 2018-05-06 MED ORDER — SODIUM CHLORIDE 0.9 % IV SOLN
1200.0000 mg | Freq: Once | INTRAVENOUS | Status: AC
  Administered 2018-05-06: 1200 mg via INTRAVENOUS
  Filled 2018-05-06: qty 20

## 2018-05-06 MED ORDER — RIVAROXABAN (XARELTO) VTE STARTER PACK (15 & 20 MG): ORAL_TABLET | ORAL | 0 refills | Status: DC

## 2018-05-06 NOTE — Telephone Encounter (Signed)
Called patient he stated that they recommended that he be on Xarelto for his blood clot in his left leg. Patient states that he is on Plavix, and they would like to know if he needs to take both or hold the Plavix. Currently the PA at the [facility] told him to hold Plavix until he heard from [place], he has not taken any today. Please advise. Thank you!

## 2018-05-06 NOTE — Progress Notes (Addendum)
[place]  [phone] [phone]  Clinic Follow up Note   Patient Care Team: System, Pcp Not In as PCP - General Troy Sine, MD as PCP - Cardiology (Cardiology) 05/06/2018  DIAGNOSIS: extensive stage (T4, N2, M1a) small cell lung cancer presented with large right perihilar mass with mediastinal invasion and loculated malignant right pleural effusion diagnosed in June 2019.  PRIOR THERAPY None  CURRENT THERAPY palliative systemic chemotherapy with carboplatin for AUC of 5 on day 1, etoposide 100 mg/M2 on days 1, 2 and 3 in addition to Tecentriq 1200 mg IV every 3 weeks with Neulasta support.  INTERVAL HISTORY: Christopher Wilson returns for follow up and cycle 2 as scheduled. He completed cycle 1 with Udenyca on 04/15/18 - 04/18/18. He was seen by in on 04/21/18 for toxicity check. Soon after that visit he developed bilateral symmetrical lower leg edema. He called [facility] and was recommended to call cardiology. He was prescribed lasix daily x3 days but has taken it for about 5 days. He notes the right leg improved to baseline but the left leg remained swollen with calf pain extending to his foot, worse with ambulation. He denies injury, previous operation, or h/o DVT.   He has lost weight, some of which he attributes to diuretic. He eats 3 times and drinks 1 boost per day. Has mild constipation, last BM 7/8, otherwise denies nausea, vomiting, or diarrhea. Activity level remains low, which he attributes to his leg/foot pain. His respiratory status continues to improve, pleurx drained every 2-3 days with "drops" of bloody fluid each time. Dyspnea on exertion is improving, nearing his baseline. Occasional cough is stable. Denies chest pain, hemoptysis, or wheezing. No new headache or vision changes.    REVIEW OF SYSTEMS:   Constitutional: Denies fevers, chills (+) abnormal weight loss (+) low activity  Eyes: Denies blurriness of vision Ears, nose, mouth, throat,  and face: Denies mucositis or sore throat Respiratory: Denies hemoptysis or wheezes (+) DOE, improving (+) intermittent cough, stable at baseline  Cardiovascular: Denies palpitation, chest discomfort (+) lower extremity swelling, L>R with calf and foot pain, redness, warmth  Gastrointestinal:  Denies nausea, vomiting, constipation, diarrhea, heartburn or change in bowel habits Skin: Denies abnormal skin rashes Lymphatics: Denies new lymphadenopathy or easy bruising Neurological:Denies numbness, tingling or new weaknesses Behavioral/Psych: Mood is stable, no new changes  All other systems were reviewed with the patient and are negative.  MEDICAL HISTORY:  Past Medical History:  Diagnosis Date  . ACS (acute coronary syndrome), with ST elevation but stable coronary arteries on cath 06/27/12 06/27/2012  . CAD (coronary artery disease), with CABG 1989 and re-do Cabg 2002 X 3 vesssels.  progressive disease with Stent to Lt. Main, and 1st diag 2006, stent to VG to LCX 2007, with known occlusion history o  06/27/2012  . CKD (chronic kidney disease) stage 3, GFR 30-59 ml/min (HCC) 06/27/2012  . 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 Coronary artery disease   . GERD (gastroesophageal reflux disease)   . Hyperlipidemia LDL goal < 70 06/27/2012  . Hypertension   . Myocardial infarction ([place])   . Peptic ulcer   . STEMI (ST elevation myocardial infarction) ([place]) 06/27/2012  . Tobacco abuse 06/27/2012    SURGICAL HISTORY: Past Surgical History:  Procedure Laterality Date  . coratid arterty surgery    . CORONARY ANGIOPLASTY WITH STENT PLACEMENT    . CORONARY ARTERY BYPASS GRAFT    . IR GUIDED DRAIN W CATHETER PLACEMENT  04/08/2018  .  LCEA    . LEFT HEART CATH N/A 06/27/2012   Procedure: LEFT HEART CATH;  Surgeon: Troy Sine, MD;  Location: [facility] CATH LAB;  Service: Cardiovascular;  Laterality: N/A;  . triple bipass     Hx of CABG and RE do  . VASCULAR SURGERY    . VIDEO BRONCHOSCOPY Bilateral  04/07/2018   Procedure: VIDEO BRONCHOSCOPY WITHOUT FLUORO;  Surgeon: Rush Farmer, MD;  Location: [facility] ENDOSCOPY;  Service: Cardiopulmonary;  Laterality: Bilateral;    I have reviewed the social history and family history with the patient and they are unchanged from previous note.  ALLERGIES:  has No Known Allergies.  MEDICATIONS:  Current Outpatient Medications  Medication Sig Dispense Refill  . albuterol (PROVENTIL HFA;VENTOLIN HFA) 108 (90 Base) MCG/ACT inhaler Inhale 2 puffs into the lungs every 6 (six) hours as needed for wheezing or shortness of breath.    Marland Kitchen aspirin 81 MG chewable tablet Chew 81 mg by mouth daily.    Marland Kitchen atorvastatin (LIPITOR) 80 MG tablet TAKE ONE TABLET BY MOUTH DAILY AT 6 P.M. NEEDS APPOINTMENT FOR FUTURE REFILLS. 90 tablet 3  . furosemide (LASIX) 40 MG tablet Take 1 tablet by mouth daily for 3 days then taking only as needed. 10 tablet 0  . metoprolol tartrate (LOPRESSOR) 25 MG tablet TAKE ONE TABLET BY MOUTH TWICE DAILY (PLEASE  CONTACT  OFFICE  FOR  ADDITIONAL  REFILLS  ) 180 tablet 2  . nitroGLYCERIN (NITROSTAT) 0.4 MG SL tablet Place 1 tablet (0.4 mg total) under the tongue every 5 (five) minutes as needed for chest pain. 25 tablet 3  . Probiotic Product (PROBIOTIC-10 PO) Take 1 tablet by mouth daily.    . prochlorperazine (COMPAZINE) 10 MG tablet Take 1 tablet (10 mg total) by mouth every 6 (six) hours as needed for nausea or vomiting. 30 tablet 0   No current facility-administered medications for this visit.    Facility-Administered Medications Ordered in Other Visits  Medication Dose Route Frequency Provider Last Rate Last Dose  . atezolizumab (TECENTRIQ) 1,200 mg in sodium chloride 0.9 % 250 mL chemo infusion  1,200 mg Intravenous Once Curt Bears, MD 270 mL/hr at 05/06/18 1544 1,200 mg at 05/06/18 1544  . CARBOplatin (PARAPLATIN) 260 mg in sodium chloride 0.9 % 250 mL chemo infusion  260 mg Intravenous Once Curt Bears, MD      . etoposide  (VEPESID) 140 mg in sodium chloride 0.9 % 500 mL chemo infusion  75 mg/m2 (Treatment Plan Recorded) Intravenous Once Curt Bears, MD        PHYSICAL EXAMINATION: ECOG PERFORMANCE STATUS: 2 - Symptomatic, <50% confined to bed  Vitals:   05/06/18 1120  BP: 116/71  Pulse: 100  Resp: 18  Temp: 97.7 F (36.5 C)  SpO2: 97%   Filed Weights   05/06/18 1120  Weight: 148 lb 4.8 oz (67.3 kg)    GENERAL:alert, no distress and comfortable SKIN: no rashes or significant lesions EYES: normal, Conjunctiva are pink and non-injected, sclera clear OROPHARYNX:no thrush or ulcers   NECK: supple, thyroid normal size, non-tender, without nodularity LYMPH:  no palpable cervical or supraclavicular lymphadenopathy LUNGS: clear to auscultation, diminished throughout, with normal breathing effort. pleurx catheter intact  HEART: regular rate & rhythm and no murmur; L>R lower extremity edema with erythema, warmth, and lower calf and foot tenderness. Pedal pulses 2+ bilaterally  ABDOMEN:abdomen soft, non-tender and normal bowel sounds Musculoskeletal:no cyanosis of digits and no clubbing  NEURO: alert & oriented x 3 with  fluent speech, no focal motor/sensory deficits  LABORATORY DATA:  I have reviewed the data as listed CBC Latest Ref Rng & Units 05/06/2018 04/29/2018 04/21/2018  WBC 4.0 - 10.3 K/uL 16.2(H) 17.3(H) 9.4  Hemoglobin 13.0 - 17.1 g/dL 9.7(L) 9.2(L) 9.7(L)  Hematocrit 38.4 - 49.9 % 29.3(L) 29.3(L) 29.6(L)  Platelets 140 - 400 K/uL 513(H) 122(L) 277     CMP Latest Ref Rng & Units 05/06/2018 04/29/2018 04/21/2018  Glucose 70 - 99 mg/dL 94 127(H) 91  BUN 8 - 23 mg/dL 37(H) 26(H) 44(H)  Creatinine 0.61 - 1.24 mg/dL 2.31(H) 1.72(H) 1.47(H)  Sodium 135 - 145 mmol/L 139 139 139  Potassium 3.5 - 5.1 mmol/L 4.4 4.5 5.3(H)  Chloride 98 - 111 mmol/L 103 106 107  CO2 22 - 32 mmol/L 28 27 25   Calcium 8.9 - 10.3 mg/dL 9.9 9.0 9.0  Total Protein 6.5 - 8.1 g/dL 7.8 6.1(L) 5.8(L)  Total Bilirubin 0.3 -  1.2 mg/dL 0.2(L) <0.2(L) 0.3  Alkaline Phos 38 - 126 U/L 123 154(H) 93  AST 15 - 41 U/L 25 16 19   ALT 0 - 44 U/L 24 17 19      RADIOGRAPHIC STUDIES: I have personally reviewed the radiological images as listed and agreed with the findings in the report. No results found.   ASSESSMENT & PLAN: This is a very pleasant 77 years old white male recently diagnosed with extensive stage (T4, N2, M1a) small cell lung cancer presented with large right perihilar mass with mediastinal invasion and loculated malignant right pleural effusion diagnosed in June 2019.   He completed cycle 1 carboplatin AUC 5, etoposide 100 mg/m2 and tecentriq 1200 mg on day 1, etoposide on days 2-3 with neulasta support. He tolerated treatment moderately well. Respiratory status is improved with scant pleurX output. The patient was seen with Dr. Julien Nordmann who reviewed staging PET and MRI results which shows no evidence of distant metastasis. MD recommends to continue current plan of care, and drain the catheter once weekly with possible removal after next restaging scan.   He developed left greater than right leg edema with calf tenderness, concerning for DVT. Today's doppler study confirms acute DVT in left lower extremity, right leg is negative. Dr. Julien Nordmann recommends to begin xarelto today, will take 15 mg 1 tab BID for 3 weeks, then on day 22 will take 1 tab 20 mg daily. I called to his pharmacy today. I reviewed side effects and increased risk of bleeding. He will stop taking aspirin. I encouraged him to call [facility] if he notices any bleeding. He is on plavix, but not on his current med list. I sent message to cardiology regarding whether to hold plavix.   He has moderate weight loss, recommend increasing po intake to incorporate high-calorie, small but frequent meals and increase boost to 3 per day. He will meet with dietician today. Labs reviewed, TSH normal, CBC and CMP adequate for treatment. Creatinine increased to 2.31, likely  related to lasix. I recommend to hold lasix and increase hydration. OK to proceed with cycle 2 chemotherapy. Return for weekly lab, f/u in 3 weeks with cycle 3, restage after cycle 3.   All questions were answered. The patient knows to call the clinic with any problems, questions or concerns. No barriers to learning was detected.     Alla Feeling, NP 05/06/18   ADDENDUM: Hematology/Oncology Attending: I had a face-to-face encounter with the patient today.  I recommended his care plan.  This is a very pleasant 77  years old white male recently diagnosed with extensive stage small cell lung cancer.  He is status post 1 cycle of systemic chemotherapy with carboplatin and etoposide.  He tolerated the first cycle of his treatment well with no concerning complaints.  The patient had completed his imaging studies recently including MRI of the brain that showed no evidence for brain metastasis.  He also had a PET scan that showed 6.7 cm right perihilar mass involving the right middle lobe and likely right upper lobe with central necrosis.  The tumor invades the right hilum and it tracks around the lower portion of the SVC, right pulmonary artery and bronchus intermedius with mediastinal component of tumor.  There was right pleural effusion with drainage tube in place. Has been undergoing drainage of the Pleurx catheter and recently down to less than 70 mL every other day.  This had significantly improved from the initial days after the insertion of the Pleurx catheter and also after starting his systemic chemotherapy.  This Pleurx catheter would probably be removed in the next few weeks based on the next CT scan results. The patient presented today with a swelling of the lower extremities and he has been treated with Lasix by his cardiologist for congestive heart failure.  He has more swelling in the left lower extremity than the right. I recommended for the patient to have ultrasound Doppler of the lower  extremities to rule out deep venous thrombosis.  The results came back later today after the visit indicating deep venous thrombosis of the left lower extremity and the patient will be started on Xarelto initially 15 mg p.o. twice daily for 3 weeks followed by 20 mg p.o. Daily. Also recommend for the patient to proceed with his treatment today with cycle #2. He will come back for follow-up visit in 3 weeks for evaluation before starting cycle #3. He was advised to call immediately if he has any concerning symptoms in the interval. The patient was advised to call immediately if he has any concerning symptoms in the interval.  Disclaimer: This note was dictated with voice recognition software. Similar sounding words can inadvertently be transcribed and may be missed upon review. Eilleen Kempf, MD 05/06/18

## 2018-05-06 NOTE — Telephone Encounter (Signed)
It has been quite a while since his last stent and I believe it is safer to stop the plavix while he takes Xarelto. When Xarelto is stopped, would restart the plavix. MCr

## 2018-05-06 NOTE — Progress Notes (Signed)
Bilateral lower extremity venous duplex completed. Right no evidence of a DVT or Baker's cyst. Left-positive for an acute DVT coursing for one of the paired posterior tibial veins through the popliteal. Unable to fully evaluate the peroneal vein due to constant shivering type leg motion. There is no evidence of a Baker's cyst. Toma Copier, RVS 05/06/2018 2:06 PM

## 2018-05-06 NOTE — Progress Notes (Signed)
Per Regan Rakers, NP ok to treat today.

## 2018-05-06 NOTE — Telephone Encounter (Signed)
New Message   Pt's daughter is calling, states the pt went to [facility] and they did doppler which showed blood clot in left leg and now the Cancer center doctor wants to put pt on Xarelto but daughter would like to discuss with heart care nurse. Please call

## 2018-05-06 NOTE — Progress Notes (Signed)
Nutrition Follow-up  Patient with lung cancer. Patient followed by Dr. Feng currently receiving chemotherapy.  Noted patient evaluated for DVT today.    Met with patient during infusion.  Patient reports that he has been eating but surprised with weight loss.  Reports eat eggs, bacon, grits for breakfast, then may drink an energy drink or eat lunch and dinner is usually meat and vegetables.    Wt Readings from Last 10 Encounters:  05/06/18 148 lb 4.8 oz (67.3 kg)  04/21/18 160 lb 1.6 oz (72.6 kg)  04/16/18 162 lb 12.8 oz (73.8 kg)  04/10/18 154 lb (69.9 kg)  03/31/18 157 lb 6.5 oz (71.4 kg)  03/24/18 161 lb 9.6 oz (73.3 kg)  03/21/18 160 lb (72.6 kg)  07/17/17 183 lb (83 kg)  01/14/17 183 lb (83 kg)  07/27/15 177 lb (80.3 kg)     MEDICATIONS: reviewed   LABS: creatinine 2.31 (noted has been taking lasix daily)   ANTHROPOMETRICS: Height:  Ht Readings from Last 1 Encounters:  05/06/18 5' 10" (1.778 m)   Weight:  Wt Readings from Last 1 Encounters:  05/06/18 148 lb 4.8 oz (67.3 kg)   BMI:  BMI Readings from Last 1 Encounters:  05/06/18 21.28 kg/m   11% weight loss in 9 months additional 7% weight loss in 1 less than 1 month   ESTIMATED ENERGY NEEDS:  Kcal: 2200-2500 /d Protein:110-125 g/d Fluid: 2.5 L/d   NUTRITION - FOCUSED PHYSICAL EXAM: deferred patient wrapped up in blanket    NUTRITION DIAGNOSIS:    Inadequate oral intake continues   DOCUMENTATION CODES:  Severe malnutrition in context of chronic illness   INTERVENTION:   Discussed ways to increase calories and protein.  Handout provided Encouraged high calorie oral nutrition supplement shake at least 3 times per day in addition to regular meals.    GOAL:  (prevent further weight loss)   MONITOR:  PO intake, Weight trends   Next Visit:  July 30 during infusion   Joli B. Allen, RD, LDN Registered Dietitian [phone] (pager)       

## 2018-05-06 NOTE — Progress Notes (Signed)
Jenna in pharmacy aware that MD [facility] stated the Etoposide can be reduced to account for the creatinine like it was last treatment.

## 2018-05-06 NOTE — Telephone Encounter (Signed)
Christopher Wilson (daughter) of patient and means of transportation had came in to r/s his infusion appointment for 7/10 said due to the drive, and traffic to move her from the 8:30 appointment. Per 7/9 patient request for time change and new calender

## 2018-05-06 NOTE — Telephone Encounter (Signed)
No LOS 7/9

## 2018-05-07 ENCOUNTER — Ambulatory Visit (HOSPITAL_COMMUNITY): Payer: Medicare HMO

## 2018-05-07 ENCOUNTER — Inpatient Hospital Stay: Payer: Medicare HMO

## 2018-05-07 VITALS — BP 114/76 | HR 87 | Temp 98.0°F | Resp 16

## 2018-05-07 DIAGNOSIS — C3411 Malignant neoplasm of upper lobe, right bronchus or lung: Secondary | ICD-10-CM

## 2018-05-07 DIAGNOSIS — Z5189 Encounter for other specified aftercare: Secondary | ICD-10-CM | POA: Diagnosis not present

## 2018-05-07 DIAGNOSIS — Z5112 Encounter for antineoplastic immunotherapy: Secondary | ICD-10-CM | POA: Diagnosis not present

## 2018-05-07 DIAGNOSIS — I129 Hypertensive chronic kidney disease with stage 1 through stage 4 chronic kidney disease, or unspecified chronic kidney disease: Secondary | ICD-10-CM | POA: Diagnosis not present

## 2018-05-07 DIAGNOSIS — Z5111 Encounter for antineoplastic chemotherapy: Secondary | ICD-10-CM | POA: Diagnosis not present

## 2018-05-07 DIAGNOSIS — C342 Malignant neoplasm of middle lobe, bronchus or lung: Secondary | ICD-10-CM | POA: Diagnosis not present

## 2018-05-07 DIAGNOSIS — K279 Peptic ulcer, site unspecified, unspecified as acute or chronic, without hemorrhage or perforation: Secondary | ICD-10-CM | POA: Diagnosis not present

## 2018-05-07 DIAGNOSIS — J910 Malignant pleural effusion: Secondary | ICD-10-CM | POA: Diagnosis not present

## 2018-05-07 DIAGNOSIS — I252 Old myocardial infarction: Secondary | ICD-10-CM | POA: Diagnosis not present

## 2018-05-07 DIAGNOSIS — Z4682 Encounter for fitting and adjustment of non-vascular catheter: Secondary | ICD-10-CM | POA: Diagnosis not present

## 2018-05-07 DIAGNOSIS — Z79899 Other long term (current) drug therapy: Secondary | ICD-10-CM | POA: Diagnosis not present

## 2018-05-07 DIAGNOSIS — C7A1 Malignant poorly differentiated neuroendocrine tumors: Secondary | ICD-10-CM | POA: Diagnosis not present

## 2018-05-07 DIAGNOSIS — I2510 Atherosclerotic heart disease of native coronary artery without angina pectoris: Secondary | ICD-10-CM | POA: Diagnosis not present

## 2018-05-07 DIAGNOSIS — N184 Chronic kidney disease, stage 4 (severe): Secondary | ICD-10-CM | POA: Diagnosis not present

## 2018-05-07 MED ORDER — DEXAMETHASONE SODIUM PHOSPHATE 10 MG/ML IJ SOLN
10.0000 mg | Freq: Once | INTRAMUSCULAR | Status: AC
  Administered 2018-05-07: 10 mg via INTRAVENOUS

## 2018-05-07 MED ORDER — SODIUM CHLORIDE 0.9 % IV SOLN
Freq: Once | INTRAVENOUS | Status: AC
  Administered 2018-05-07: 14:00:00 via INTRAVENOUS

## 2018-05-07 MED ORDER — SODIUM CHLORIDE 0.9 % IV SOLN
75.0000 mg/m2 | Freq: Once | INTRAVENOUS | Status: AC
  Administered 2018-05-07: 140 mg via INTRAVENOUS
  Filled 2018-05-07: qty 7

## 2018-05-07 MED ORDER — DEXAMETHASONE SODIUM PHOSPHATE 10 MG/ML IJ SOLN
INTRAMUSCULAR | Status: AC
  Filled 2018-05-07: qty 1

## 2018-05-07 NOTE — Telephone Encounter (Signed)
Called patient, LMTCB to discuss which medication to take.  Left call back number.

## 2018-05-07 NOTE — Patient Instructions (Addendum)
[place] Discharge Instructions for Patients Receiving Chemotherapy  Today you received the following chemotherapy agents: Etoposide (Vepesid).   To help prevent nausea and vomiting after your treatment, we encourage you to take your nausea medication as directed.   If you develop nausea and vomiting that is not controlled by your nausea medication, call the clinic.   BELOW ARE SYMPTOMS THAT SHOULD BE REPORTED IMMEDIATELY:  *FEVER GREATER THAN 100.5 F  *CHILLS WITH OR WITHOUT FEVER  NAUSEA AND VOMITING THAT IS NOT CONTROLLED WITH YOUR NAUSEA MEDICATION  *UNUSUAL SHORTNESS OF BREATH  *UNUSUAL BRUISING OR BLEEDING  TENDERNESS IN MOUTH AND THROAT WITH OR WITHOUT PRESENCE OF ULCERS  *URINARY PROBLEMS  *BOWEL PROBLEMS  UNUSUAL RASH Items with * indicate a potential emergency and should be followed up as soon as possible.  Feel free to call the clinic should you have any questions or concerns. The clinic phone number is [phone].  Please show the [place] at check-in to the Emergency Department and triage nurse.

## 2018-05-07 NOTE — Telephone Encounter (Signed)
Patient returned call, discussed with patient which medication to take per Dr.Croitoru. Patient verbalized understanding, no questions at this time.

## 2018-05-08 ENCOUNTER — Inpatient Hospital Stay: Payer: Medicare HMO

## 2018-05-08 ENCOUNTER — Telehealth: Payer: Self-pay | Admitting: Internal Medicine

## 2018-05-08 ENCOUNTER — Telehealth: Payer: Self-pay | Admitting: Oncology

## 2018-05-08 VITALS — BP 127/76 | HR 82 | Temp 97.6°F | Resp 17

## 2018-05-08 DIAGNOSIS — Z79899 Other long term (current) drug therapy: Secondary | ICD-10-CM | POA: Diagnosis not present

## 2018-05-08 DIAGNOSIS — Z5189 Encounter for other specified aftercare: Secondary | ICD-10-CM | POA: Diagnosis not present

## 2018-05-08 DIAGNOSIS — C342 Malignant neoplasm of middle lobe, bronchus or lung: Secondary | ICD-10-CM | POA: Diagnosis not present

## 2018-05-08 DIAGNOSIS — C3411 Malignant neoplasm of upper lobe, right bronchus or lung: Secondary | ICD-10-CM

## 2018-05-08 DIAGNOSIS — Z5111 Encounter for antineoplastic chemotherapy: Secondary | ICD-10-CM | POA: Diagnosis not present

## 2018-05-08 DIAGNOSIS — Z5112 Encounter for antineoplastic immunotherapy: Secondary | ICD-10-CM | POA: Diagnosis not present

## 2018-05-08 MED ORDER — SODIUM CHLORIDE 0.9 % IV SOLN
75.0000 mg/m2 | Freq: Once | INTRAVENOUS | Status: AC
  Administered 2018-05-08: 140 mg via INTRAVENOUS
  Filled 2018-05-08: qty 7

## 2018-05-08 MED ORDER — DEXAMETHASONE SODIUM PHOSPHATE 10 MG/ML IJ SOLN
10.0000 mg | Freq: Once | INTRAMUSCULAR | Status: AC
  Administered 2018-05-08: 10 mg via INTRAVENOUS

## 2018-05-08 MED ORDER — DEXAMETHASONE SODIUM PHOSPHATE 10 MG/ML IJ SOLN
INTRAMUSCULAR | Status: AC
  Filled 2018-05-08: qty 1

## 2018-05-08 MED ORDER — SODIUM CHLORIDE 0.9 % IV SOLN
Freq: Once | INTRAVENOUS | Status: AC
  Administered 2018-05-08: 12:00:00 via INTRAVENOUS

## 2018-05-08 NOTE — Telephone Encounter (Signed)
Patients daughter called to r/s his injection appt from 7/13 to 7/12.  OK per Marice Potter Nurse to move to 7/12 after 2:00pm

## 2018-05-08 NOTE — Patient Instructions (Signed)
[place] Discharge Instructions for Patients Receiving Chemotherapy  Today you received the following chemotherapy agents Etoposide  To help prevent nausea and vomiting after your treatment, we encourage you to take your nausea medication as directed If you develop nausea and vomiting that is not controlled by your nausea medication, call the clinic.   BELOW ARE SYMPTOMS THAT SHOULD BE REPORTED IMMEDIATELY:  *FEVER GREATER THAN 100.5 F  *CHILLS WITH OR WITHOUT FEVER  NAUSEA AND VOMITING THAT IS NOT CONTROLLED WITH YOUR NAUSEA MEDICATION  *UNUSUAL SHORTNESS OF BREATH  *UNUSUAL BRUISING OR BLEEDING  TENDERNESS IN MOUTH AND THROAT WITH OR WITHOUT PRESENCE OF ULCERS  *URINARY PROBLEMS  *BOWEL PROBLEMS  UNUSUAL RASH Items with * indicate a potential emergency and should be followed up as soon as possible.  Feel free to call the clinic should you have any questions or concerns. The clinic phone number is [phone].  Please show the [place] at check-in to the Emergency Department and triage nurse.

## 2018-05-09 ENCOUNTER — Inpatient Hospital Stay: Payer: Medicare HMO

## 2018-05-09 VITALS — BP 130/69 | HR 77 | Temp 98.0°F | Resp 18

## 2018-05-09 DIAGNOSIS — Z5189 Encounter for other specified aftercare: Secondary | ICD-10-CM | POA: Diagnosis not present

## 2018-05-09 DIAGNOSIS — C3411 Malignant neoplasm of upper lobe, right bronchus or lung: Secondary | ICD-10-CM

## 2018-05-09 DIAGNOSIS — Z5112 Encounter for antineoplastic immunotherapy: Secondary | ICD-10-CM | POA: Diagnosis not present

## 2018-05-09 DIAGNOSIS — C342 Malignant neoplasm of middle lobe, bronchus or lung: Secondary | ICD-10-CM | POA: Diagnosis not present

## 2018-05-09 DIAGNOSIS — Z79899 Other long term (current) drug therapy: Secondary | ICD-10-CM | POA: Diagnosis not present

## 2018-05-09 DIAGNOSIS — Z5111 Encounter for antineoplastic chemotherapy: Secondary | ICD-10-CM | POA: Diagnosis not present

## 2018-05-09 MED ORDER — PEGFILGRASTIM-CBQV 6 MG/0.6ML ~~LOC~~ SOSY
6.0000 mg | PREFILLED_SYRINGE | Freq: Once | SUBCUTANEOUS | Status: AC
  Administered 2018-05-09: 6 mg via SUBCUTANEOUS

## 2018-05-09 MED ORDER — PEGFILGRASTIM-CBQV 6 MG/0.6ML ~~LOC~~ SOSY
PREFILLED_SYRINGE | SUBCUTANEOUS | Status: AC
  Filled 2018-05-09: qty 0.6

## 2018-05-09 NOTE — Patient Instructions (Signed)
Pegfilgrastim injection What is this medicine? PEGFILGRASTIM (PEG fil gra stim) is a long-acting granulocyte colony-stimulating factor that stimulates the growth of neutrophils, a type of white blood cell important in the body's fight against infection. It is used to reduce the incidence of fever and infection in patients with certain types of cancer who are receiving chemotherapy that affects the bone marrow, and to increase survival after being exposed to high doses of radiation. This medicine may be used for other purposes; ask your health care provider or pharmacist if you have questions. COMMON BRAND NAME(S): Neulasta What should I tell my health care provider before I take this medicine? They need to know if you have any of these conditions: -kidney disease -latex allergy -ongoing radiation therapy -sickle cell disease -skin reactions to acrylic adhesives (On-Body Injector only) -an unusual or allergic reaction to pegfilgrastim, filgrastim, other medicines, foods, dyes, or preservatives -pregnant or trying to get pregnant -breast-feeding How should I use this medicine? This medicine is for injection under the skin. If you get this medicine at home, you will be taught how to prepare and give the pre-filled syringe or how to use the On-body Injector. Refer to the patient Instructions for Use for detailed instructions. Use exactly as directed. Tell your healthcare provider immediately if you suspect that the On-body Injector may not have performed as intended or if you suspect the use of the On-body Injector resulted in a missed or partial dose. It is important that you put your used needles and syringes in a special sharps container. Do not put them in a trash can. If you do not have a sharps container, call your pharmacist or healthcare provider to get one. Talk to your pediatrician regarding the use of this medicine in children. While this drug may be prescribed for selected conditions,  precautions do apply. Overdosage: If you think you have taken too much of this medicine contact a poison control center or emergency room at once. NOTE: This medicine is only for you. Do not share this medicine with others. What if I miss a dose? It is important not to miss your dose. Call your doctor or health care professional if you miss your dose. If you miss a dose due to an On-body Injector failure or leakage, a new dose should be administered as soon as possible using a single prefilled syringe for manual use. What may interact with this medicine? Interactions have not been studied. Give your health care provider a list of all the medicines, herbs, non-prescription drugs, or dietary supplements you use. Also tell them if you smoke, drink alcohol, or use illegal drugs. Some items may interact with your medicine. This list may not describe all possible interactions. Give your health care provider a list of all the medicines, herbs, non-prescription drugs, or dietary supplements you use. Also tell them if you smoke, drink alcohol, or use illegal drugs. Some items may interact with your medicine. What should I watch for while using this medicine? You may need blood work done while you are taking this medicine. If you are going to need a MRI, CT scan, or other procedure, tell your doctor that you are using this medicine (On-Body Injector only). What side effects may I notice from receiving this medicine? Side effects that you should report to your doctor or health care professional as soon as possible: -allergic reactions like skin rash, itching or hives, swelling of the face, lips, or tongue -dizziness -fever -pain, redness, or irritation at site   where injected -pinpoint red spots on the skin -red or dark-brown urine -shortness of breath or breathing problems -stomach or side pain, or pain at the shoulder -swelling -tiredness -trouble passing urine or change in the amount of urine Side  effects that usually do not require medical attention (report to your doctor or health care professional if they continue or are bothersome): -bone pain -muscle pain This list may not describe all possible side effects. Call your doctor for medical advice about side effects. You may report side effects to FDA at 1-800-FDA-1088. Where should I keep my medicine? Keep out of the reach of children. Store pre-filled syringes in a refrigerator between 2 and 8 degrees C (36 and 46 degrees F). Do not freeze. Keep in carton to protect from light. Throw away this medicine if it is left out of the refrigerator for more than 48 hours. Throw away any unused medicine after the expiration date. NOTE: This sheet is a summary. It may not cover all possible information. If you have questions about this medicine, talk to your doctor, pharmacist, or health care provider.  2018 Elsevier/Gold Standard (2016-10-11 12:58:03)  

## 2018-05-10 ENCOUNTER — Inpatient Hospital Stay: Payer: Medicare HMO

## 2018-05-10 ENCOUNTER — Encounter (HOSPITAL_COMMUNITY): Payer: Self-pay | Admitting: Emergency Medicine

## 2018-05-10 ENCOUNTER — Emergency Department (HOSPITAL_COMMUNITY)
Admission: EM | Admit: 2018-05-10 | Discharge: 2018-05-10 | Disposition: A | Discharge status: Home / Self Care | Payer: Medicare HMO | Attending: Emergency Medicine | Admitting: Emergency Medicine

## 2018-05-10 ENCOUNTER — Emergency Department (HOSPITAL_COMMUNITY)
Admission: EM | Admit: 2018-05-10 | Discharge: 2018-05-10 | Disposition: A | Discharge status: Home / Self Care | Payer: Medicare HMO | Source: Home / Self Care | Attending: Emergency Medicine | Admitting: Emergency Medicine

## 2018-05-10 ENCOUNTER — Other Ambulatory Visit: Payer: Self-pay

## 2018-05-10 DIAGNOSIS — Z79899 Other long term (current) drug therapy: Secondary | ICD-10-CM

## 2018-05-10 DIAGNOSIS — Z85118 Personal history of other malignant neoplasm of bronchus and lung: Secondary | ICD-10-CM | POA: Diagnosis not present

## 2018-05-10 DIAGNOSIS — E875 Hyperkalemia: Secondary | ICD-10-CM | POA: Diagnosis not present

## 2018-05-10 DIAGNOSIS — R600 Localized edema: Secondary | ICD-10-CM | POA: Diagnosis not present

## 2018-05-10 DIAGNOSIS — I824Z2 Acute embolism and thrombosis of unspecified deep veins of left distal lower extremity: Secondary | ICD-10-CM

## 2018-05-10 DIAGNOSIS — F17210 Nicotine dependence, cigarettes, uncomplicated: Secondary | ICD-10-CM | POA: Insufficient documentation

## 2018-05-10 DIAGNOSIS — I82422 Acute embolism and thrombosis of left iliac vein: Secondary | ICD-10-CM | POA: Insufficient documentation

## 2018-05-10 DIAGNOSIS — F17200 Nicotine dependence, unspecified, uncomplicated: Secondary | ICD-10-CM

## 2018-05-10 DIAGNOSIS — I252 Old myocardial infarction: Secondary | ICD-10-CM

## 2018-05-10 DIAGNOSIS — R609 Edema, unspecified: Secondary | ICD-10-CM

## 2018-05-10 DIAGNOSIS — I129 Hypertensive chronic kidney disease with stage 1 through stage 4 chronic kidney disease, or unspecified chronic kidney disease: Secondary | ICD-10-CM

## 2018-05-10 DIAGNOSIS — N183 Chronic kidney disease, stage 3 (moderate): Secondary | ICD-10-CM | POA: Insufficient documentation

## 2018-05-10 DIAGNOSIS — M7989 Other specified soft tissue disorders: Secondary | ICD-10-CM | POA: Diagnosis not present

## 2018-05-10 DIAGNOSIS — I2510 Atherosclerotic heart disease of native coronary artery without angina pectoris: Secondary | ICD-10-CM | POA: Insufficient documentation

## 2018-05-10 DIAGNOSIS — E785 Hyperlipidemia, unspecified: Secondary | ICD-10-CM | POA: Insufficient documentation

## 2018-05-10 DIAGNOSIS — R2241 Localized swelling, mass and lump, right lower limb: Secondary | ICD-10-CM | POA: Diagnosis present

## 2018-05-10 LAB — BASIC METABOLIC PANEL
Anion gap: 6 (ref 5–15)
BUN: 49 mg/dL — AB (ref 8–23)
CHLORIDE: 111 mmol/L (ref 98–111)
CO2: 24 mmol/L (ref 22–32)
Calcium: 8.9 mg/dL (ref 8.9–10.3)
Creatinine, Ser: 1.66 mg/dL — ABNORMAL HIGH (ref 0.61–1.24)
GFR calc Af Amer: 45 mL/min — ABNORMAL LOW (ref >60)
GFR, EST NON AFRICAN AMERICAN: 38 mL/min — AB (ref >60)
GLUCOSE: 89 mg/dL (ref 70–99)
POTASSIUM: 5.5 mmol/L — AB (ref 3.5–5.1)
Sodium: 141 mmol/L (ref 135–145)

## 2018-05-10 NOTE — ED Provider Notes (Signed)
I received a phone call from pharmacy, just after the patient was discharged and gone form The ED. His CrCl is apparently too low for Xarelto and he needs it rechecked to continue taking the medication. I called the patient and discussed the pharmacologic concerns, He is  Willing to return to the ED For BMP. Will need pharmacy consult on the results of the BMP   Margarita Mail, PA-C 05/10/18 1403    Lajean Saver, MD 05/11/18 [phone]

## 2018-05-10 NOTE — Discharge Instructions (Addendum)
Get help right away if: You develop shortness of breath, especially when you are lying down. You have pain in your chest or abdomen. You feel weak. You faint.

## 2018-05-10 NOTE — ED Notes (Signed)
Called main lab to get ETA on results for BMP. Was told they are having trouble getting the results to cross over and she is working on it at this time.

## 2018-05-10 NOTE — ED Notes (Signed)
Pt declined d/c vitals. He states, "No, I'm fine" when asked to get his BP. A&Ox4. Ambulatory.

## 2018-05-10 NOTE — ED Provider Notes (Signed)
[facility] DEPT Provider Note   CSN: [id number] Arrival date & time: 05/10/18  1113     History   Chief Complaint Chief Complaint  Patient presents with  . DVT  . Leg Swelling    HPI Christopher Wilson is a 77 y.o. male with past medical history of small cell carcinoma of the lung who was recently diagnosed with DVT of the left leg and started on Xarelto.  Patient presents to the emergency department for worsening swelling in the foot.  He denies any chest pain, shortness of breath, melena, hematochezia.  He is on his third day of the starter dose pack of Xarelto and has been compliant with the medications.  HPI  Past Medical History:  Diagnosis Date  . ACS (acute coronary syndrome), with ST elevation but stable coronary arteries on cath 06/27/12 06/27/2012  . CAD (coronary artery disease), with CABG 1989 and re-do Cabg 2002 X 3 vesssels.  progressive disease with Stent to Lt. Main, and 1st diag 2006, stent to VG to LCX 2007, with known occlusion history o  06/27/2012  . CKD (chronic kidney disease) stage 3, GFR 30-59 ml/min (HCC) 06/27/2012  . 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 Coronary artery disease   . GERD (gastroesophageal reflux disease)   . Hyperlipidemia LDL goal < 70 06/27/2012  . Hypertension   . Myocardial infarction ([place])   . Peptic ulcer   . STEMI (ST elevation myocardial infarction) ([place]) 06/27/2012  . Tobacco abuse 06/27/2012    Patient Active Problem List   Diagnosis Date Noted  . 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 Encounter for antineoplastic chemotherapy 04/10/2018  . Goals of care, counseling/discussion 04/10/2018  . 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 Lung mass   . Small cell lung cancer ([place])   . Hypoxemia   . Pleural effusion   . Muscular deconditioning   . Dyspnea on exertion   . Shortness of breath   . History of thoracentesis   . Mass of middle lobe of right lung    . History of coronary artery bypass graft   . Chronic renal insufficiency, stage 4 (severe) (HCC)   . Pleural effusion on right 03/31/2018  . Essential hypertension 07/08/2015  . Hypotension 01/24/2014  . ACS (acute coronary syndrome), with ST elevation but stable coronary arteries on cath 06/27/12 06/27/2012  . CAD (coronary artery disease) of artery bypass graft 06/27/2012  . CAD (coronary artery disease), with CABG 1989 and re-do Cabg 2002 X 3 vesssels.  progressive disease with Stent to Lt. Main, and 1st diag 2006, stent to VG to LCX 2007, with known occlusion history o  06/27/2012  . Unilateral congenital absence of kidney 06/27/2012  . CKD (chronic kidney disease) stage 3, GFR 30-59 ml/min (HCC) 06/27/2012  . Hyperlipidemia with target LDL less than 70 06/27/2012  . Tobacco abuse 06/27/2012    Past Surgical History:  Procedure Laterality Date  . coratid arterty surgery    . CORONARY ANGIOPLASTY WITH STENT PLACEMENT    . CORONARY ARTERY BYPASS GRAFT    . IR GUIDED DRAIN W CATHETER PLACEMENT  04/08/2018  . LCEA    . LEFT HEART CATH N/A 06/27/2012   Procedure: LEFT HEART CATH;  Surgeon: Troy Sine, MD;  Location: [facility] CATH LAB;  Service: Cardiovascular;  Laterality: N/A;  . triple bipass     Hx of CABG and RE do  . VASCULAR SURGERY    .  VIDEO BRONCHOSCOPY Bilateral 04/07/2018   Procedure: VIDEO BRONCHOSCOPY WITHOUT FLUORO;  Surgeon: Rush Farmer, MD;  Location: [facility] ENDOSCOPY;  Service: Cardiopulmonary;  Laterality: Bilateral;        Home Medications    Prior to Admission medications   Medication Sig Start Date End Date Taking? Authorizing Provider  acetaminophen (TYLENOL) 325 MG tablet Take 650 mg by mouth every 6 (six) hours as needed for moderate pain.   Yes [provider]  albuterol (PROVENTIL HFA;VENTOLIN HFA) 108 (90 Base) MCG/ACT inhaler Inhale 2 puffs into the lungs every 6 (six) hours as needed for wheezing or shortness of breath.   Yes [provider]  atorvastatin (LIPITOR) 80 MG tablet TAKE ONE TABLET BY MOUTH DAILY AT 6 P.M. NEEDS APPOINTMENT FOR FUTURE REFILLS. 01/15/17  Yes Troy Sine, MD  metoprolol tartrate (LOPRESSOR) 25 MG tablet TAKE ONE TABLET BY MOUTH TWICE DAILY (PLEASE  CONTACT  OFFICE  FOR  ADDITIONAL  REFILLS  ) Patient taking differently: TAKE ONE TABLET BY MOUTH DAILY (PLEASE  CONTACT  OFFICE  FOR  ADDITIONAL  REFILLS  ) 07/08/17  Yes Troy Sine, MD  prochlorperazine (COMPAZINE) 10 MG tablet Take 1 tablet (10 mg total) by mouth every 6 (six) hours as needed for nausea or vomiting. 04/10/18  Yes Curt Bears, MD  Rivaroxaban 15 & 20 MG TBPK Take as directed on package: Take one 15mg  tablet by mouth twice a day with food. On Day 22, switch to one 20mg  tablet once a day with food. 05/06/18  Yes Alla Feeling, NP  furosemide (LASIX) 40 MG tablet Take 1 tablet by mouth daily for 3 days then taking only as needed. Patient not taking: Reported on 05/10/2018 04/30/18   Leonie Man, MD  nitroGLYCERIN (NITROSTAT) 0.4 MG SL tablet Place 1 tablet (0.4 mg total) under the tongue every 5 (five) minutes as needed for chest pain. 08/29/17   Troy Sine, MD    Family History Family History  Problem Relation Age of Onset  . Stroke Mother   . Heart attack Father   . Stroke Maternal Aunt     Social History Social History   Tobacco Use  . Smoking status: Current Every Day Smoker    Packs/day: 1.50  . Smokeless tobacco: Never Used  Substance Use Topics  . Alcohol use: Yes    Alcohol/week: 6.0 oz    Types: 10 Cans of beer per week  . Drug use: No     Allergies   Patient has no known allergies.   Review of Systems Review of Systems  Ten systems reviewed and are negative for acute change, except as noted in the HPI.   Physical Exam Updated Vital Signs BP 129/79 (BP Location: Right Arm)   Pulse 88   Temp (!) 97.5 F (36.4 C) (Oral)   Resp 18   Ht 5\' 10"  (1.778 m)   Wt 67.1 kg (148 lb)    SpO2 96%   BMI 21.24 kg/m   Physical Exam  Constitutional: He is oriented to person, place, and time. He appears well-developed and well-nourished. No distress.  HENT:  Head: Normocephalic and atraumatic.  Eyes: Conjunctivae are normal. No scleral icterus.  Neck: Normal range of motion. Neck supple.  Cardiovascular: Normal rate, regular rhythm and normal heart sounds.  Pulmonary/Chest: Effort normal and breath sounds normal. No respiratory distress.  Abdominal: Soft. There is no tenderness.  Musculoskeletal: He exhibits edema.  Neurological: He is alert and oriented  to person, place, and time.  Skin: Skin is warm and dry. He is not diaphoretic.  Psychiatric: His behavior is normal.  Nursing note and vitals reviewed.    ED Treatments / Results  Labs (all labs ordered are listed, but only abnormal results are displayed) Labs Reviewed - No data to display  EKG None  Radiology No results found.  Procedures Procedures (including critical care time)  Medications Ordered in ED Medications - No data to display   Initial Impression / Assessment and Plan / ED Course  I have reviewed the triage vital signs and the nursing notes.  Pertinent labs & imaging results that were available during my care of the patient were reviewed by me and considered in my medical decision making (see chart for details).     Patient with nightly worsening edema of the left lower extremity he does not have any active chest pain, shortness of breath, hypoxia or other abnormalities suggestive of pulmonary embolus.  Discussed compression of the lower extremity.  Seen and shared visit with Dr. spinal.  Patient will be discharged to follow-up with PCP.  Continue taking Xarelto as directed.  Final Clinical Impressions(s) / ED Diagnoses   Final diagnoses:  Acute deep vein thrombosis (DVT) of distal vein of left lower extremity [facility])  Peripheral edema    ED Discharge Orders        Ordered     Compression stockings    Comments:  GRADIENT COMPRESSION LEFT LEG   05/10/18 [place], Susannah Carbin, PA-C 05/10/18 1400    Lajean Saver, MD 05/11/18 [phone]

## 2018-05-10 NOTE — ED Provider Notes (Signed)
[facility] DEPT Provider Note   CSN: [id number] Arrival date & time: 05/10/18  1340     History   Chief Complaint Chief Complaint  Patient presents with  . Blood Work    HPI Knowledge Christopher Wilson is a 77 y.Christopher. male presenting for evaluation of creatinine check.  Patient was evaluated in the ER earlier where it was found that he had a left lower leg DVT.  He was started on Xarelto and discharged.  Upon review of the labs, patient had elevated creatinine 4 days ago, which cannot support Xarelto, patient was told to return for creatinine recheck.  Patient reports no new complaints since discharge previously.   HPI  Past Medical History:  Diagnosis Date  . ACS (acute coronary syndrome), with ST elevation but stable coronary arteries on cath 06/27/12 06/27/2012  . CAD (coronary artery disease), with CABG 1989 and re-do Cabg 2002 X 3 vesssels.  progressive disease with Stent to Lt. Main, and 1st diag 2006, stent to VG to LCX 2007, with known occlusion history Christopher  06/27/2012  . CKD (chronic kidney disease) stage 3, GFR 30-59 ml/min (HCC) 06/27/2012  . 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 Coronary artery disease   . GERD (gastroesophageal reflux disease)   . Hyperlipidemia LDL goal < 70 06/27/2012  . Hypertension   . Myocardial infarction ([place])   . Peptic ulcer   . STEMI (ST elevation myocardial infarction) ([place]) 06/27/2012  . Tobacco abuse 06/27/2012    Patient Active Problem List   Diagnosis Date Noted  . 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 Encounter for antineoplastic chemotherapy 04/10/2018  . Goals of care, counseling/discussion 04/10/2018  . Cancer, neuroendocrine, poorly differentiated ([place])   . Lung mass   . Small cell lung cancer ([place])   . Hypoxemia   . Pleural effusion   . Muscular deconditioning   . Dyspnea on exertion   . Shortness of breath   . History of thoracentesis   . Mass of middle lobe of right lung   .  History of coronary artery bypass graft   . Chronic renal insufficiency, stage 4 (severe) (HCC)   . Pleural effusion on right 03/31/2018  . Essential hypertension 07/08/2015  . Hypotension 01/24/2014  . ACS (acute coronary syndrome), with ST elevation but stable coronary arteries on cath 06/27/12 06/27/2012  . CAD (coronary artery disease) of artery bypass graft 06/27/2012  . CAD (coronary artery disease), with CABG 1989 and re-do Cabg 2002 X 3 vesssels.  progressive disease with Stent to Lt. Main, and 1st diag 2006, stent to VG to LCX 2007, with known occlusion history Christopher  06/27/2012  . Unilateral congenital absence of kidney 06/27/2012  . CKD (chronic kidney disease) stage 3, GFR 30-59 ml/min (HCC) 06/27/2012  . Hyperlipidemia with target LDL less than 70 06/27/2012  . Tobacco abuse 06/27/2012    Past Surgical History:  Procedure Laterality Date  . coratid arterty surgery    . CORONARY ANGIOPLASTY WITH STENT PLACEMENT    . CORONARY ARTERY BYPASS GRAFT    . IR GUIDED DRAIN W CATHETER PLACEMENT  04/08/2018  . LCEA    . LEFT HEART CATH N/A 06/27/2012   Procedure: LEFT HEART CATH;  Surgeon: Troy Sine, MD;  Location[facility] CATH LAB;  Service: Cardiovascular;  Laterality: N/A;  . triple bipass     Hx of CABG and RE do  . VASCULAR SURGERY    . VIDEO BRONCHOSCOPY  Bilateral 04/07/2018   Procedure: VIDEO BRONCHOSCOPY WITHOUT FLUORO;  Surgeon: Rush Farmer, MD;  Location: [facility] ENDOSCOPY;  Service: Cardiopulmonary;  Laterality: Bilateral;        Home Medications    Prior to Admission medications   Medication Sig Start Date End Date Taking? Authorizing Provider  acetaminophen (TYLENOL) 325 MG tablet Take 650 mg by mouth every 6 (six) hours as needed for moderate pain.   Yes [provider]  albuterol (PROVENTIL HFA;VENTOLIN HFA) 108 (90 Base) MCG/ACT inhaler Inhale 2 puffs into the lungs every 6 (six) hours as needed for wheezing or shortness of breath.   Yes [provider]  atorvastatin (LIPITOR) 80 MG tablet TAKE ONE TABLET BY MOUTH DAILY AT 6 P.M. NEEDS APPOINTMENT FOR FUTURE REFILLS. 01/15/17  Yes Troy Sine, MD  metoprolol tartrate (LOPRESSOR) 25 MG tablet TAKE ONE TABLET BY MOUTH TWICE DAILY (PLEASE  CONTACT  OFFICE  FOR  ADDITIONAL  REFILLS  ) Patient taking differently: TAKE ONE TABLET BY MOUTH DAILY (PLEASE  CONTACT  OFFICE  FOR  ADDITIONAL  REFILLS  ) 07/08/17  Yes Troy Sine, MD  nitroGLYCERIN (NITROSTAT) 0.4 MG SL tablet Place 1 tablet (0.4 mg total) under the tongue every 5 (five) minutes as needed for chest pain. 08/29/17  Yes Troy Sine, MD  prochlorperazine (COMPAZINE) 10 MG tablet Take 1 tablet (10 mg total) by mouth every 6 (six) hours as needed for nausea or vomiting. 04/10/18  Yes Curt Bears, MD  Rivaroxaban 15 & 20 MG TBPK Take as directed on package: Take one 15mg  tablet by mouth twice a day with food. On Day 22, switch to one 20mg  tablet once a day with food. 05/06/18  Yes Alla Feeling, NP  furosemide (LASIX) 40 MG tablet Take 1 tablet by mouth daily for 3 days then taking only as needed. Patient not taking: Reported on 05/10/2018 04/30/18   Leonie Man, MD    Family History Family History  Problem Relation Age of Onset  . Stroke Mother   . Heart attack Father   . Stroke Maternal Aunt     Social History Social History   Tobacco Use  . Smoking status: Current Every Day Smoker    Packs/day: 1.50  . Smokeless tobacco: Never Used  Substance Use Topics  . Alcohol use: Yes    Alcohol/week: 6.0 oz    Types: 10 Cans of beer per week  . Drug use: No     Allergies   Patient has no known allergies.   Review of Systems Review of Systems  Cardiovascular: Positive for leg swelling.  All other systems reviewed and are negative.    Physical Exam Updated Vital Signs There were no vitals taken for this visit.  Physical Exam  Constitutional: He is oriented to person, place, and time. He appears well-developed  and well-nourished. No distress.  Elderly male in no acute distress  HENT:  Head: Normocephalic and atraumatic.  Eyes: Pupils are equal, round, and reactive to light. Conjunctivae and EOM are normal.  Neck: Normal range of motion. Neck supple.  Cardiovascular: Normal rate, regular rhythm and intact distal pulses.  Pulmonary/Chest: Effort normal and breath sounds normal. No respiratory distress. He has no wheezes.  Abdominal: Soft. He exhibits no distension. There is no tenderness.  Musculoskeletal: Normal range of motion. He exhibits edema.  Neurological: He is alert and oriented to person, place, and time.  Skin: Skin is warm and dry.  Psychiatric: He has  a normal mood and affect.  Nursing note and vitals reviewed.    ED Treatments / Results  Labs (all labs ordered are listed, but only abnormal results are displayed) Labs Reviewed  BASIC METABOLIC PANEL - Abnormal; Notable for the following components:      Result Value   Potassium 5.5 (*)    BUN 49 (*)    Creatinine, Ser 1.66 (*)    GFR calc non Af Amer 38 (*)    GFR calc Af Amer 45 (*)    All other components within normal limits    EKG None  Radiology No results found.  Procedures Procedures (including critical care time)  Medications Ordered in ED Medications - No data to display   Initial Impression / Assessment and Plan / ED Course  I have reviewed the triage vital signs and the nursing notes.  Pertinent labs & imaging results that were available during my care of the patient were reviewed by me and considered in my medical decision making (see chart for details).     Patient presenting for creatinine recheck.  Physical exam reassuring, patient without any new complaints.  BMP ordered.  BMP shows improvement of creatinine to 1.66, previously greater than 2.  Called pharmacy, who stated that patient is safe to start Xarelto.  Of note, patient's potassium was slightly elevated at 5.5.  Blood work was running  for over 2 hours, concern for possible hemolysis.  Patient without chest pain or shortness of breath.  Discussed with attending, Dr. Vallery Ridge agrees to plan.  Will tell patient to avoid high potassium foods and start patient on Xarelto.  Patient has an appointment in 3 days in which he can get his labs rechecked, patient instructed to recheck his creatinine and potassium.  Discussed with patient and daughter, who are agreeable to plan.  At this time, patient appears safe for discharge.   Final Clinical Impressions(s) / ED Diagnoses   Final diagnoses:  Hyperkalemia  Acute deep vein thrombosis (DVT) of distal vein of left lower extremity [facility])    ED Discharge Orders    None       Franchot Heidelberg, PA-C 05/10/18 1636    Charlesetta Shanks, MD 05/14/18 1609

## 2018-05-10 NOTE — ED Triage Notes (Signed)
Pt reports Tuesday had doppler done and diagnosed with DVT in left leg. Pt report started on blood thinners. Today left lower leg swelling is worse than has been and some pain.

## 2018-05-10 NOTE — ED Notes (Addendum)
Pt had to be called to come back due to abnormal blood work and not able to take Alen Blew that he was prescribed for his DVT

## 2018-05-10 NOTE — Discharge Instructions (Addendum)
Take xarelto as prescribed.  You should have your kidneys and potassium checked at your next appointment.  Avoid foods with high levels of potassium. Return to the ER with any new or concerning symptoms.

## 2018-05-10 NOTE — ED Triage Notes (Signed)
Patient here from home needing Creat. check to start blood thinner.

## 2018-05-12 NOTE — Telephone Encounter (Signed)
Was in ED 05/10/18 for leg swelling.  Pt is on xarelto. Pt discharged.

## 2018-05-13 ENCOUNTER — Ambulatory Visit: Payer: Medicare HMO | Admitting: Physician Assistant

## 2018-05-13 ENCOUNTER — Inpatient Hospital Stay: Payer: Medicare HMO

## 2018-05-13 DIAGNOSIS — Z5112 Encounter for antineoplastic immunotherapy: Secondary | ICD-10-CM | POA: Diagnosis not present

## 2018-05-13 DIAGNOSIS — N184 Chronic kidney disease, stage 4 (severe): Secondary | ICD-10-CM | POA: Diagnosis not present

## 2018-05-13 DIAGNOSIS — I129 Hypertensive chronic kidney disease with stage 1 through stage 4 chronic kidney disease, or unspecified chronic kidney disease: Secondary | ICD-10-CM | POA: Diagnosis not present

## 2018-05-13 DIAGNOSIS — Z5189 Encounter for other specified aftercare: Secondary | ICD-10-CM | POA: Diagnosis not present

## 2018-05-13 DIAGNOSIS — J910 Malignant pleural effusion: Secondary | ICD-10-CM | POA: Diagnosis not present

## 2018-05-13 DIAGNOSIS — C342 Malignant neoplasm of middle lobe, bronchus or lung: Secondary | ICD-10-CM | POA: Diagnosis not present

## 2018-05-13 DIAGNOSIS — Z4682 Encounter for fitting and adjustment of non-vascular catheter: Secondary | ICD-10-CM | POA: Diagnosis not present

## 2018-05-13 DIAGNOSIS — Z5111 Encounter for antineoplastic chemotherapy: Secondary | ICD-10-CM | POA: Diagnosis not present

## 2018-05-13 DIAGNOSIS — I252 Old myocardial infarction: Secondary | ICD-10-CM | POA: Diagnosis not present

## 2018-05-13 DIAGNOSIS — C7A1 Malignant poorly differentiated neuroendocrine tumors: Secondary | ICD-10-CM | POA: Diagnosis not present

## 2018-05-13 DIAGNOSIS — K279 Peptic ulcer, site unspecified, unspecified as acute or chronic, without hemorrhage or perforation: Secondary | ICD-10-CM | POA: Diagnosis not present

## 2018-05-13 DIAGNOSIS — Z79899 Other long term (current) drug therapy: Secondary | ICD-10-CM | POA: Diagnosis not present

## 2018-05-13 DIAGNOSIS — C3411 Malignant neoplasm of upper lobe, right bronchus or lung: Secondary | ICD-10-CM

## 2018-05-13 DIAGNOSIS — I2510 Atherosclerotic heart disease of native coronary artery without angina pectoris: Secondary | ICD-10-CM | POA: Diagnosis not present

## 2018-05-13 LAB — CBC WITH DIFFERENTIAL (CANCER CENTER ONLY)
Basophils Absolute: 0 10*3/uL (ref 0.0–0.1)
Basophils Relative: 0 %
EOS ABS: 0 10*3/uL (ref 0.0–0.5)
EOS PCT: 0 %
HCT: 29.3 % — ABNORMAL LOW (ref 38.4–49.9)
Hemoglobin: 9.3 g/dL — ABNORMAL LOW (ref 13.0–17.1)
Lymphocytes Relative: 5 %
Lymphs Abs: 0.8 10*3/uL — ABNORMAL LOW (ref 0.9–3.3)
MCH: 29.2 pg (ref 27.2–33.4)
MCHC: 31.7 g/dL — ABNORMAL LOW (ref 32.0–36.0)
MCV: 91.8 fL (ref 79.3–98.0)
MONO ABS: 0.9 10*3/uL (ref 0.1–0.9)
Monocytes Relative: 5 %
Neutro Abs: 14.2 10*3/uL — ABNORMAL HIGH (ref 1.5–6.5)
Neutrophils Relative %: 90 %
PLATELETS: 424 10*3/uL — AB (ref 140–400)
RBC: 3.19 MIL/uL — AB (ref 4.20–5.82)
RDW: 15.5 % — AB (ref 11.0–14.6)
WBC: 15.9 10*3/uL — AB (ref 4.0–10.3)

## 2018-05-13 LAB — CMP (CANCER CENTER ONLY)
ALT: 15 U/L (ref 0–44)
ANION GAP: 5 (ref 5–15)
AST: 11 U/L — ABNORMAL LOW (ref 15–41)
Albumin: 2.7 g/dL — ABNORMAL LOW (ref 3.5–5.0)
Alkaline Phosphatase: 167 U/L — ABNORMAL HIGH (ref 38–126)
BUN: 40 mg/dL — ABNORMAL HIGH (ref 8–23)
CHLORIDE: 108 mmol/L (ref 98–111)
CO2: 25 mmol/L (ref 22–32)
Calcium: 8.9 mg/dL (ref 8.9–10.3)
Creatinine: 1.47 mg/dL — ABNORMAL HIGH (ref 0.61–1.24)
GFR, EST NON AFRICAN AMERICAN: 45 mL/min — AB (ref >60)
GFR, Est AFR Am: 52 mL/min — ABNORMAL LOW (ref >60)
Glucose, Bld: 97 mg/dL (ref 70–99)
POTASSIUM: 5.3 mmol/L — AB (ref 3.5–5.1)
SODIUM: 138 mmol/L (ref 135–145)
Total Bilirubin: 0.3 mg/dL (ref 0.3–1.2)
Total Protein: 6.4 g/dL — ABNORMAL LOW (ref 6.5–8.1)

## 2018-05-13 LAB — FUNGUS CULTURE WITH STAIN

## 2018-05-13 LAB — FUNGUS CULTURE RESULT

## 2018-05-13 LAB — FUNGAL ORGANISM REFLEX

## 2018-05-19 ENCOUNTER — Telehealth: Payer: Self-pay | Admitting: Medical Oncology

## 2018-05-19 DIAGNOSIS — J910 Malignant pleural effusion: Secondary | ICD-10-CM | POA: Diagnosis not present

## 2018-05-19 DIAGNOSIS — I2510 Atherosclerotic heart disease of native coronary artery without angina pectoris: Secondary | ICD-10-CM | POA: Diagnosis not present

## 2018-05-19 DIAGNOSIS — Z4682 Encounter for fitting and adjustment of non-vascular catheter: Secondary | ICD-10-CM | POA: Diagnosis not present

## 2018-05-19 DIAGNOSIS — C342 Malignant neoplasm of middle lobe, bronchus or lung: Secondary | ICD-10-CM | POA: Diagnosis not present

## 2018-05-19 DIAGNOSIS — K279 Peptic ulcer, site unspecified, unspecified as acute or chronic, without hemorrhage or perforation: Secondary | ICD-10-CM | POA: Diagnosis not present

## 2018-05-19 DIAGNOSIS — I252 Old myocardial infarction: Secondary | ICD-10-CM | POA: Diagnosis not present

## 2018-05-19 DIAGNOSIS — I129 Hypertensive chronic kidney disease with stage 1 through stage 4 chronic kidney disease, or unspecified chronic kidney disease: Secondary | ICD-10-CM | POA: Diagnosis not present

## 2018-05-19 DIAGNOSIS — C7A1 Malignant poorly differentiated neuroendocrine tumors: Secondary | ICD-10-CM | POA: Diagnosis not present

## 2018-05-19 DIAGNOSIS — N184 Chronic kidney disease, stage 4 (severe): Secondary | ICD-10-CM | POA: Diagnosis not present

## 2018-05-19 LAB — ACID FAST CULTURE WITH REFLEXED SENSITIVITIES (MYCOBACTERIA): Acid Fast Culture: NEGATIVE

## 2018-05-19 NOTE — Telephone Encounter (Signed)
Pleurix catheter- .Drainage was zero over the weekend and 5 ml week before.  When can tube be removed? Catheter placed June 11th .

## 2018-05-20 ENCOUNTER — Other Ambulatory Visit: Payer: Self-pay | Admitting: Cardiovascular Disease

## 2018-05-20 ENCOUNTER — Inpatient Hospital Stay: Payer: Medicare HMO

## 2018-05-20 DIAGNOSIS — C342 Malignant neoplasm of middle lobe, bronchus or lung: Secondary | ICD-10-CM | POA: Diagnosis not present

## 2018-05-20 DIAGNOSIS — Z5111 Encounter for antineoplastic chemotherapy: Secondary | ICD-10-CM | POA: Diagnosis not present

## 2018-05-20 DIAGNOSIS — C3411 Malignant neoplasm of upper lobe, right bronchus or lung: Secondary | ICD-10-CM

## 2018-05-20 DIAGNOSIS — Z5112 Encounter for antineoplastic immunotherapy: Secondary | ICD-10-CM | POA: Diagnosis not present

## 2018-05-20 DIAGNOSIS — Z79899 Other long term (current) drug therapy: Secondary | ICD-10-CM | POA: Diagnosis not present

## 2018-05-20 DIAGNOSIS — Z5189 Encounter for other specified aftercare: Secondary | ICD-10-CM | POA: Diagnosis not present

## 2018-05-20 LAB — CBC WITH DIFFERENTIAL (CANCER CENTER ONLY)
Basophils Absolute: 0.2 10*3/uL — ABNORMAL HIGH (ref 0.0–0.1)
Basophils Relative: 1 %
EOS ABS: 0.1 10*3/uL (ref 0.0–0.5)
Eosinophils Relative: 1 %
HEMATOCRIT: 30.4 % — AB (ref 38.4–49.9)
HEMOGLOBIN: 9.6 g/dL — AB (ref 13.0–17.1)
LYMPHS ABS: 1.6 10*3/uL (ref 0.9–3.3)
LYMPHS PCT: 5 %
MCH: 29 pg (ref 27.2–33.4)
MCHC: 31.7 g/dL — ABNORMAL LOW (ref 32.0–36.0)
MCV: 91.7 fL (ref 79.3–98.0)
MONOS PCT: 6 %
Monocytes Absolute: 1.7 10*3/uL — ABNORMAL HIGH (ref 0.1–0.9)
NEUTROS ABS: 25.9 10*3/uL — AB (ref 1.5–6.5)
NEUTROS PCT: 87 %
Platelet Count: 140 10*3/uL (ref 140–400)
RBC: 3.31 MIL/uL — AB (ref 4.20–5.82)
RDW: 17 % — ABNORMAL HIGH (ref 11.0–14.6)
WBC: 29.5 10*3/uL — AB (ref 4.0–10.3)

## 2018-05-20 LAB — CMP (CANCER CENTER ONLY)
ALT: 11 U/L (ref 0–44)
ANION GAP: 9 (ref 5–15)
AST: 13 U/L — ABNORMAL LOW (ref 15–41)
Albumin: 3 g/dL — ABNORMAL LOW (ref 3.5–5.0)
Alkaline Phosphatase: 156 U/L — ABNORMAL HIGH (ref 38–126)
BUN: 30 mg/dL — ABNORMAL HIGH (ref 8–23)
CHLORIDE: 106 mmol/L (ref 98–111)
CO2: 24 mmol/L (ref 22–32)
Calcium: 9.8 mg/dL (ref 8.9–10.3)
Creatinine: 1.93 mg/dL — ABNORMAL HIGH (ref 0.61–1.24)
GFR, EST AFRICAN AMERICAN: 37 mL/min — AB (ref >60)
GFR, EST NON AFRICAN AMERICAN: 32 mL/min — AB (ref >60)
Glucose, Bld: 94 mg/dL (ref 70–99)
POTASSIUM: 4.8 mmol/L (ref 3.5–5.1)
Sodium: 139 mmol/L (ref 135–145)
Total Bilirubin: <0.2 mg/dL — ABNORMAL LOW (ref 0.3–1.2)
Total Protein: 7.1 g/dL (ref 6.5–8.1)

## 2018-05-20 NOTE — Telephone Encounter (Signed)
°*  STAT* If patient is at the pharmacy, call can be transferred to refill team.   1. Which medications need to be refilled? (please list name of each medication and dose if known) Need  new prescriptions for Atorvastatin and IIsosorbide  2. Which pharmacy/location (including street and city if local pharmacy) is medication to be sent to?[facility] in [place] not [employer]- pt is  staying in [place] now  3. Do they need a 30 day or 90 day supply? Isosorbide- 45 and Atorvastatin 90 and refills

## 2018-05-21 MED ORDER — ATORVASTATIN CALCIUM 80 MG PO TABS: ORAL_TABLET | ORAL | 3 refills | Status: DC

## 2018-05-21 NOTE — Telephone Encounter (Signed)
Follow Up:     Pt is out of his medicine, please call it in today if possible. Pt was last seen here in May 2019.

## 2018-05-22 ENCOUNTER — Telehealth: Payer: Self-pay | Admitting: *Deleted

## 2018-05-22 NOTE — Telephone Encounter (Signed)
"  Jeanmarie Hubert RN, [place] [phone]).  Haven't heard so I'm calling back about Tuesday's communication about NO OUTPUT from PleuRx.  Call in to let me know:  How often to drain PleuRx?Marland Kitchen  Can daughter drain once weekly until he's seen by provider?  Daughter has been checked off on how to drain PleuRx.  Do we still need to see him?"

## 2018-05-23 ENCOUNTER — Other Ambulatory Visit: Payer: Self-pay | Admitting: Cardiovascular Disease

## 2018-05-23 NOTE — Telephone Encounter (Signed)
Returned call to patient's daughter. Explained that imdur was discontinued on 04/09/18 at hospital discharge. She is unsure why this med was stopped, explained that I am not clear on that. She states he has been on this med for years. When asked if patient was having anginal symptoms, was unable to get a clear answer from daughter, she just reiterated that patient had been on for years and she was unsure why it was stopped. Explained that Dr. Claiborne Billings would have to re-prescribe this medication, since it is not a current active Rx. Explained he is out of the office and not available to review messages until early next week. She asked that another cardiologist review this concern.   Will route to Dr. Claiborne Billings & DOD Dr. Harrell Gave

## 2018-05-23 NOTE — Telephone Encounter (Signed)
Patient's daughter returned call. Notified her of MD recommendations and that Dr. Claiborne Billings agreed as well. She states patient will call back to make an appointment.

## 2018-05-23 NOTE — Telephone Encounter (Signed)
New Message    Pt c/o medication issue:  1. Name of Medication: Isosorbide mono er  2. How are you currently taking this medication (dosage and times per day)? 60mg   3. Are you having a reaction (difficulty breathing--STAT)? no  4. What is your medication issue?  Pt states they have been waiting for this medication to be filled. Please call

## 2018-05-23 NOTE — Telephone Encounter (Signed)
Christopher Dresser, MD  Sent: Fri May 23, 2018 11:19 AM  To: Fidel Levy, RN; Troy Sine, MD      Message   I reviewed the patient's chart--it appears that amlodipine and imdur were discontinued at the time of discharge due to episodes of hypotension. If he is not having chest pain currently, then the safest way forward is for him to be seen, blood pressure checked, and the medications reviewed. We would not want him to have hypotension again. Thank you!

## 2018-05-23 NOTE — Telephone Encounter (Signed)
LMTCB

## 2018-05-23 NOTE — Telephone Encounter (Signed)
Per Mikey Bussing NP patient's daughter can empty drain if she has been checked off.

## 2018-05-23 NOTE — Telephone Encounter (Signed)
agee

## 2018-05-26 NOTE — Progress Notes (Signed)
[place] OFFICE PROGRESS NOTE  Patient, No Pcp Per No address on file  DIAGNOSIS:extensive stage (T4, N2, M1a) small cell lung cancer presented with large right perihilar mass with mediastinal invasion and loculated malignant right pleural effusion diagnosed in June 2019.  PRIOR THERAPYNone  CURRENT THERAPYpalliative systemic chemotherapy with carboplatin for AUC of 5 on day 1, etoposide 100 mg/M2 on days 1, 2 and 3 in addition to Tecentriq 1200 mg IV every 3 weeks with Neulasta support.  First dose given on 04/15/2018.  Status post 2 cycles.  INTERVAL HISTORY: Christopher Wilson 77 y.o. male returns for routine follow-up visit accompanied by his daughter.  The patient is feeling fine today and has no specific complaints except for intermittent left lower extremity edema.  He is on Xarelto for recently diagnosed DVT.  He states that he missed a few doses of Xarelto over the weekend.  He did not take it because he had a small amount of blood in his nasal drainage.  This is since resolved.  He has had no other bleeding.  He denies fevers and chills.  Denies chest pain, shortness of breath, cough, hemoptysis.  Denies nausea, vomiting, constipation, diarrhea.  He is gained back some of his lost weight.  His Pleurx catheter remains in place to the right chest.  He states that nothing has drained out in several weeks.  He is wondering if this can be removed.  The patient is here for evaluation prior to starting cycle #3 of his chemotherapy.  MEDICAL HISTORY: Past Medical History:  Diagnosis Date  . ACS (acute coronary syndrome), with ST elevation but stable coronary arteries on cath 06/27/12 06/27/2012  . CAD (coronary artery disease), with CABG 1989 and re-do Cabg 2002 X 3 vesssels.  progressive disease with Stent to Lt. Main, and 1st diag 2006, stent to VG to LCX 2007, with known occlusion history o  06/27/2012  . CKD (chronic kidney disease) stage 3, GFR 30-59 ml/min (HCC) 06/27/2012   . 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 Coronary artery disease   . GERD (gastroesophageal reflux disease)   . Hyperlipidemia LDL goal < 70 06/27/2012  . Hypertension   . Myocardial infarction ([place])   . Peptic ulcer   . STEMI (ST elevation myocardial infarction) ([place]) 06/27/2012  . Tobacco abuse 06/27/2012    ALLERGIES:  has No Known Allergies.  MEDICATIONS:  Current Outpatient Medications  Medication Sig Dispense Refill  . albuterol (PROVENTIL HFA;VENTOLIN HFA) 108 (90 Base) MCG/ACT inhaler Inhale 2 puffs into the lungs every 6 (six) hours as needed for wheezing or shortness of breath.    Marland Kitchen atorvastatin (LIPITOR) 80 MG tablet TAKE ONE TABLET BY MOUTH DAILY AT 6 P.M. NEEDS APPOINTMENT FOR FUTURE REFILLS. 90 tablet 3  . metoprolol tartrate (LOPRESSOR) 25 MG tablet TAKE ONE TABLET BY MOUTH TWICE DAILY (PLEASE  CONTACT  OFFICE  FOR  ADDITIONAL  REFILLS  ) (Patient taking differently: TAKE ONE TABLET BY MOUTH DAILY (PLEASE  CONTACT  OFFICE  FOR  ADDITIONAL  REFILLS  )) 180 tablet 2  . Rivaroxaban 15 & 20 MG TBPK Take as directed on package: Take one 15mg  tablet by mouth twice a day with food. On Day 22, switch to one 20mg  tablet once a day with food. 51 each 0  . acetaminophen (TYLENOL) 325 MG tablet Take 650 mg by mouth every 6 (six) hours as needed for moderate pain.    . furosemide (LASIX) 40 MG tablet Take 1 tablet  by mouth daily for 3 days then taking only as needed. (Patient not taking: Reported on 05/10/2018) 10 tablet 0  . nitroGLYCERIN (NITROSTAT) 0.4 MG SL tablet Place 1 tablet (0.4 mg total) under the tongue every 5 (five) minutes as needed for chest pain. (Patient not taking: Reported on 05/27/2018) 25 tablet 3  . prochlorperazine (COMPAZINE) 10 MG tablet Take 1 tablet (10 mg total) by mouth every 6 (six) hours as needed for nausea or vomiting. (Patient not taking: Reported on 05/27/2018) 30 tablet 0  . rivaroxaban (XARELTO) 20 MG TABS tablet Take 1 tablet (20 mg total) by mouth daily  with supper. 30 tablet 1   No current facility-administered medications for this visit.    Facility-Administered Medications Ordered in Other Visits  Medication Dose Route Frequency Provider Last Rate Last Dose  . etoposide (VEPESID) 190 mg in sodium chloride 0.9 % 500 mL chemo infusion  100 mg/m2 (Treatment Plan Recorded) Intravenous Once Curt Bears, MD        SURGICAL HISTORY:  Past Surgical History:  Procedure Laterality Date  . coratid arterty surgery    . CORONARY ANGIOPLASTY WITH STENT PLACEMENT    . CORONARY ARTERY BYPASS GRAFT    . IR GUIDED DRAIN W CATHETER PLACEMENT  04/08/2018  . LCEA    . LEFT HEART CATH N/A 06/27/2012   Procedure: LEFT HEART CATH;  Surgeon: Troy Sine, MD;  Location: [facility] CATH LAB;  Service: Cardiovascular;  Laterality: N/A;  . triple bipass     Hx of CABG and RE do  . VASCULAR SURGERY    . VIDEO BRONCHOSCOPY Bilateral 04/07/2018   Procedure: VIDEO BRONCHOSCOPY WITHOUT FLUORO;  Surgeon: Rush Farmer, MD;  Location: [facility] ENDOSCOPY;  Service: Cardiopulmonary;  Laterality: Bilateral;    REVIEW OF SYSTEMS:   Review of Systems  Constitutional: Negative for appetite change, chills, fatigue, fever and unexpected weight change.  HENT:   Negative for mouth sores, sore throat and trouble swallowing.   Eyes: Negative for eye problems and icterus.  Respiratory: Negative for cough, hemoptysis, shortness of breath and wheezing.   Cardiovascular: Negative for chest pain.  Positive for left lower extremity edema.  Gastrointestinal: Negative for abdominal pain, constipation, diarrhea, nausea and vomiting.  Genitourinary: Negative for bladder incontinence, difficulty urinating, dysuria, frequency and hematuria.   Musculoskeletal: Negative for back pain, gait problem, neck pain and neck stiffness.  Skin: Negative for itching and rash.  Neurological: Negative for dizziness, extremity weakness, gait problem, headaches, light-headedness and seizures.   Hematological: Negative for adenopathy. Does not bruise/bleed easily.  Psychiatric/Behavioral: Negative for confusion, depression and sleep disturbance. The patient is not nervous/anxious.     PHYSICAL EXAMINATION:  Blood pressure 140/78, pulse 88, temperature 97.9 F (36.6 C), temperature source Oral, resp. rate 20, height 5\' 10"  (1.778 m), weight 154 lb 4.8 oz (70 kg), SpO2 99 %.  ECOG PERFORMANCE STATUS: 1 - Symptomatic but completely ambulatory  Physical Exam  Constitutional: Oriented to person, place, and time and well-developed, well-nourished, and in no distress. No distress.  HENT:  Head: Normocephalic and atraumatic.  Mouth/Throat: Oropharynx is clear and moist. No oropharyngeal exudate.  Eyes: Conjunctivae are normal. Right eye exhibits no discharge. Left eye exhibits no discharge. No scleral icterus.  Neck: Normal range of motion. Neck supple.  Cardiovascular: Normal rate, regular rhythm, normal heart sounds and intact distal pulses.  Trace edema to the left lower extremity.  Pulmonary/Chest: Effort normal and breath sounds normal. No respiratory distress. No wheezes. No rales.  Right checks Pleurx catheter in place.  No redness or drainage around the dressing. Abdominal: Soft. Bowel sounds are normal. Exhibits no distension and no mass. There is no tenderness.  Musculoskeletal: Normal range of motion.   Lymphadenopathy:    No cervical adenopathy.  Neurological: Alert and oriented to person, place, and time. Exhibits normal muscle tone. Gait normal. Coordination normal.  Skin: Skin is warm and dry. No rash noted. Not diaphoretic. No erythema. No pallor.  Psychiatric: Mood, memory and judgment normal.  Vitals reviewed.  LABORATORY DATA: Lab Results  Component Value Date   WBC 15.2 (H) 05/27/2018   HGB 9.3 (L) 05/27/2018   HCT 29.7 (L) 05/27/2018   MCV 95.2 05/27/2018   PLT 297 05/27/2018      Chemistry      Component Value Date/Time   NA 141 05/27/2018 0933   NA  140 03/21/2018 1048   K 4.5 05/27/2018 0933   CL 108 05/27/2018 0933   CO2 25 05/27/2018 0933   BUN 23 05/27/2018 0933   BUN 45 (H) 03/21/2018 1048   CREATININE 1.78 (H) 05/27/2018 0933   CREATININE 1.80 (H) 09/06/2014 1033      Component Value Date/Time   CALCIUM 9.3 05/27/2018 0933   ALKPHOS 118 05/27/2018 0933   AST 10 (L) 05/27/2018 0933   ALT 8 05/27/2018 0933   BILITOT 0.3 05/27/2018 0933       RADIOGRAPHIC STUDIES:  Mr Brain Wo Contrast  Result Date: 05/02/2018 CLINICAL DATA:  Recent diagnosis of small cell lung cancer. Staging. EXAM: MRI HEAD WITHOUT CONTRAST TECHNIQUE: Multiplanar, multiecho pulse sequences of the brain and surrounding structures were obtained without intravenous contrast. COMPARISON:  None. FINDINGS: Brain: Diffusion imaging does not show any acute or subacute infarction or other cause of restricted diffusion. Chronic small-vessel ischemic changes affect the pons. 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 Cerebral hemispheres show generalized atrophy with minimal small vessel change of the white matter. No cortical or large vessel territory infarction. No mass lesion, acute hemorrhage, hydrocephalus or extra-axial collection. Few scattered punctate foci of hemosiderin deposition related to old small vessel infarctions. Vascular: Major vessels at the base of the brain show flow. Skull and upper cervical spine: Negative Sinuses/Orbits: Clear/normal Other: None IMPRESSION: No evidence of metastatic disease. This is a noncontrast study which limits sensitivity for tiny lesions, but there is no positive evidence of metastatic disease. Chronic small-vessel ischemic changes of the pons, cerebellum and to a minimal extent the cerebral hemispheric white matter. Electronically Signed   By: Nelson Chimes M.D.   On: 05/02/2018 12:53     ASSESSMENT/PLAN:  Small cell carcinoma of upper lobe of right lung [facility]) This is a very pleasant 77 year old white male  recently diagnosed with extensive stage (T4, N2, M1a) small cell lung cancer presented with large right perihilar mass with mediastinal invasion and loculated malignant right pleural effusion diagnosed in June 2019. He is currently on palliative systemic chemotherapy with carboplatin for AUC of 5 on day 1, etoposide 100 mg/M2 on days 1, 2 and 3 in addition to Tecentriq 1200 mg IV every 3 weeks with Neulasta support.  Status post 2 cycles which he is tolerating fairly well with no concerning complaints. Recommend for him to proceed with cycle 3 of his treatment today as scheduled.  Creatinine is 1.78 which is consistent with baseline.  Okay for him to proceed. He will have a restaging CT scan of the chest, abdomen, pelvis in approximately 3  weeks.  He will follow-up in 3 weeks for evaluation prior to cycle #4 of his treatment and to review his restaging CT scan results.  For the recurrent malignant pleural effusion, he has a Pleurx catheter in place.  It has not drained in several weeks.  We will consider having this removed by interventional radiology following the restaging CT scan.  For his left lower extremity DVT, he was advised to continue on Xarelto.  I have refilled this today.  He was advised not to hold this medication that calling us.  If he experiences any recurrent nosebleeds or any other bleeding, I have advised him to come in for evaluation.  He was advised to call immediately if he has any concerning symptoms in the interval.  The patient voices understanding of current disease status and treatment options and is in agreement with the current care plan.  All questions were answered. The patient knows to call the clinic with any problems, questions or concerns. We can certainly see the patient much sooner if necessary.   Orders Placed This Encounter  Procedures  . CT ABDOMEN PELVIS W CONTRAST    Standing Status:   Future    Standing Expiration Date:   05/28/2019    Order Specific  Question:   If indicated for the ordered procedure, I authorize the administration of contrast media per Radiology protocol    Answer:   Yes    Order Specific Question:   Preferred imaging location?    Answer:   [facility]    Order Specific Question:   Radiology Contrast Protocol - do NOT remove file path    Answer:   \\[email or web address]    Order Specific Question:   ** REASON FOR EXAM (FREE TEXT)    Answer:   Lung cancer. Restaging.  . CT CHEST W CONTRAST    Standing Status:   Future    Standing Expiration Date:   05/28/2019    Order Specific Question:   If indicated for the ordered procedure, I authorize the administration of contrast media per Radiology protocol    Answer:   Yes    Order Specific Question:   Preferred imaging location?    Answer:   [facility]    Order Specific Question:   Radiology Contrast Protocol - do NOT remove file path    Answer:   \\[email or web address]    Order Specific Question:   ** REASON FOR EXAM (FREE TEXT)    Answer:   Lung cancer. Restaging.   Mikey Bussing, DNP, AGPCNP-BC, AOCNP 05/27/18

## 2018-05-27 ENCOUNTER — Encounter: Payer: Self-pay | Admitting: Oncology

## 2018-05-27 ENCOUNTER — Other Ambulatory Visit: Payer: Medicare HMO

## 2018-05-27 ENCOUNTER — Ambulatory Visit: Payer: Medicare HMO | Admitting: Oncology

## 2018-05-27 ENCOUNTER — Telehealth: Payer: Self-pay | Admitting: Oncology

## 2018-05-27 ENCOUNTER — Inpatient Hospital Stay: Payer: Medicare HMO

## 2018-05-27 ENCOUNTER — Inpatient Hospital Stay (HOSPITAL_BASED_OUTPATIENT_CLINIC_OR_DEPARTMENT_OTHER): Payer: Medicare HMO | Admitting: Oncology

## 2018-05-27 ENCOUNTER — Ambulatory Visit: Payer: Medicare HMO

## 2018-05-27 VITALS — BP 140/78 | HR 88 | Temp 97.9°F | Resp 20 | Ht 70.0 in | Wt 154.3 lb

## 2018-05-27 DIAGNOSIS — Z5111 Encounter for antineoplastic chemotherapy: Secondary | ICD-10-CM

## 2018-05-27 DIAGNOSIS — I824Z2 Acute embolism and thrombosis of unspecified deep veins of left distal lower extremity: Secondary | ICD-10-CM

## 2018-05-27 DIAGNOSIS — Z7901 Long term (current) use of anticoagulants: Secondary | ICD-10-CM | POA: Diagnosis not present

## 2018-05-27 DIAGNOSIS — C3411 Malignant neoplasm of upper lobe, right bronchus or lung: Secondary | ICD-10-CM

## 2018-05-27 DIAGNOSIS — Z79899 Other long term (current) drug therapy: Secondary | ICD-10-CM | POA: Diagnosis not present

## 2018-05-27 DIAGNOSIS — I82409 Acute embolism and thrombosis of unspecified deep veins of unspecified lower extremity: Secondary | ICD-10-CM | POA: Insufficient documentation

## 2018-05-27 DIAGNOSIS — Z5112 Encounter for antineoplastic immunotherapy: Secondary | ICD-10-CM | POA: Diagnosis not present

## 2018-05-27 DIAGNOSIS — Z5189 Encounter for other specified aftercare: Secondary | ICD-10-CM | POA: Diagnosis not present

## 2018-05-27 DIAGNOSIS — C342 Malignant neoplasm of middle lobe, bronchus or lung: Secondary | ICD-10-CM | POA: Diagnosis not present

## 2018-05-27 DIAGNOSIS — J910 Malignant pleural effusion: Secondary | ICD-10-CM | POA: Diagnosis not present

## 2018-05-27 DIAGNOSIS — I82402 Acute embolism and thrombosis of unspecified deep veins of left lower extremity: Secondary | ICD-10-CM | POA: Diagnosis not present

## 2018-05-27 DIAGNOSIS — I10 Essential (primary) hypertension: Secondary | ICD-10-CM | POA: Diagnosis not present

## 2018-05-27 DIAGNOSIS — R5382 Chronic fatigue, unspecified: Secondary | ICD-10-CM

## 2018-05-27 LAB — CMP (CANCER CENTER ONLY)
ALK PHOS: 118 U/L (ref 38–126)
ALT: 8 U/L (ref 0–44)
ANION GAP: 8 (ref 5–15)
AST: 10 U/L — ABNORMAL LOW (ref 15–41)
Albumin: 2.7 g/dL — ABNORMAL LOW (ref 3.5–5.0)
BUN: 23 mg/dL (ref 8–23)
CALCIUM: 9.3 mg/dL (ref 8.9–10.3)
CO2: 25 mmol/L (ref 22–32)
Chloride: 108 mmol/L (ref 98–111)
Creatinine: 1.78 mg/dL — ABNORMAL HIGH (ref 0.61–1.24)
GFR, EST AFRICAN AMERICAN: 41 mL/min — AB (ref >60)
GFR, EST NON AFRICAN AMERICAN: 35 mL/min — AB (ref >60)
Glucose, Bld: 102 mg/dL — ABNORMAL HIGH (ref 70–99)
Potassium: 4.5 mmol/L (ref 3.5–5.1)
SODIUM: 141 mmol/L (ref 135–145)
TOTAL PROTEIN: 7.1 g/dL (ref 6.5–8.1)
Total Bilirubin: 0.3 mg/dL (ref 0.3–1.2)

## 2018-05-27 LAB — CBC WITH DIFFERENTIAL (CANCER CENTER ONLY)
BASOS ABS: 0 10*3/uL (ref 0.0–0.1)
BASOS PCT: 0 %
Eosinophils Absolute: 0.1 10*3/uL (ref 0.0–0.5)
Eosinophils Relative: 1 %
HCT: 29.7 % — ABNORMAL LOW (ref 38.4–49.9)
HEMOGLOBIN: 9.3 g/dL — AB (ref 13.0–17.1)
Lymphocytes Relative: 7 %
Lymphs Abs: 1.1 10*3/uL (ref 0.9–3.3)
MCH: 29.8 pg (ref 27.2–33.4)
MCHC: 31.3 g/dL — AB (ref 32.0–36.0)
MCV: 95.2 fL (ref 79.3–98.0)
MONOS PCT: 6 %
Monocytes Absolute: 1 10*3/uL — ABNORMAL HIGH (ref 0.1–0.9)
NEUTROS PCT: 86 %
Neutro Abs: 13 10*3/uL — ABNORMAL HIGH (ref 1.5–6.5)
Platelet Count: 297 10*3/uL (ref 140–400)
RBC: 3.12 MIL/uL — AB (ref 4.20–5.82)
RDW: 17.8 % — ABNORMAL HIGH (ref 11.0–14.6)
WBC: 15.2 10*3/uL — AB (ref 4.0–10.3)

## 2018-05-27 LAB — TSH: TSH: 0.943 u[IU]/mL (ref 0.320–4.118)

## 2018-05-27 MED ORDER — SODIUM CHLORIDE 0.9 % IV SOLN
100.0000 mg/m2 | Freq: Once | INTRAVENOUS | Status: AC
  Administered 2018-05-27: 190 mg via INTRAVENOUS
  Filled 2018-05-27: qty 9.5

## 2018-05-27 MED ORDER — SODIUM CHLORIDE 0.9 % IV SOLN
Freq: Once | INTRAVENOUS | Status: AC
  Administered 2018-05-27: 11:00:00 via INTRAVENOUS
  Filled 2018-05-27: qty 250

## 2018-05-27 MED ORDER — PALONOSETRON HCL INJECTION 0.25 MG/5ML
0.2500 mg | Freq: Once | INTRAVENOUS | Status: AC
  Administered 2018-05-27: 0.25 mg via INTRAVENOUS

## 2018-05-27 MED ORDER — SODIUM CHLORIDE 0.9 % IV SOLN
1200.0000 mg | Freq: Once | INTRAVENOUS | Status: AC
  Administered 2018-05-27: 1200 mg via INTRAVENOUS
  Filled 2018-05-27: qty 20

## 2018-05-27 MED ORDER — RIVAROXABAN 20 MG PO TABS: 20.0000 mg | ORAL_TABLET | Freq: Every day | ORAL | 1 refills | Status: DC

## 2018-05-27 MED ORDER — SODIUM CHLORIDE 0.9 % IV SOLN
286.0000 mg | Freq: Once | INTRAVENOUS | Status: AC
  Administered 2018-05-27: 290 mg via INTRAVENOUS
  Filled 2018-05-27: qty 29

## 2018-05-27 MED ORDER — SODIUM CHLORIDE 0.9 % IV SOLN
Freq: Once | INTRAVENOUS | Status: AC
  Administered 2018-05-27: 12:00:00 via INTRAVENOUS
  Filled 2018-05-27: qty 5

## 2018-05-27 NOTE — Progress Notes (Signed)
Okay to treat with elevated Cr., per Adrienne Mocha., NP

## 2018-05-27 NOTE — Patient Instructions (Signed)
[facility] Discharge Instructions for Patients Receiving Chemotherapy  Today you received the following chemotherapy agents Etoposide, Atezolizumab, Carboplatin.  To help prevent nausea and vomiting after your treatment, we encourage you to take your nausea medication as directed If you develop nausea and vomiting that is not controlled by your nausea medication, call the clinic.   BELOW ARE SYMPTOMS THAT SHOULD BE REPORTED IMMEDIATELY:  *FEVER GREATER THAN 100.5 F  *CHILLS WITH OR WITHOUT FEVER  NAUSEA AND VOMITING THAT IS NOT CONTROLLED WITH YOUR NAUSEA MEDICATION  *UNUSUAL SHORTNESS OF BREATH  *UNUSUAL BRUISING OR BLEEDING  TENDERNESS IN MOUTH AND THROAT WITH OR WITHOUT PRESENCE OF ULCERS  *URINARY PROBLEMS  *BOWEL PROBLEMS  UNUSUAL RASH Items with * indicate a potential emergency and should be followed up as soon as possible.  Feel free to call the clinic should you have any questions or concerns. The clinic phone number is [phone].  Please show the [place] at check-in to the Emergency Department and triage nurse.

## 2018-05-27 NOTE — Telephone Encounter (Signed)
Scheduled apt 7/30 los - gave patient AVS and calender per los. Central radiology to contact patient with ct scan.

## 2018-05-27 NOTE — Progress Notes (Signed)
Nutrition Follow-up:  Patient with lung cancer.  Patient followed by Dr. Burr Medico currently receiving chemotherapy.    Met with patient during infusion today.  Patient reports appetite is good. Reports eating 3 meals per day. Currently is living with daughter who prepares meals for him. Reports that he has gotten tired of drinking the ensure/boost shakes and has not been drinking them recently.   No nutrition impact symptoms reported today    Medications: reviewed  Labs: reviewed  Anthropometrics:   Weight has increased to 154 lb noted today from 148 lb on 7/9.   NUTRITION DIAGNOSIS: Inadequate oral intake improving   MALNUTRITION DIAGNOSIS: severe malnutrition improving   INTERVENTION:  Encouraged patient to continue to eat 3 meals per day.  We discussed adding snacks between meals if not able to drink shakes.     MONITORING, EVALUATION, GOAL: weight trends, po intake   NEXT VISIT: September 10 during infusion  Jalisa Sacco B. Zenia Resides, [place], [place] Registered Dietitian [phone] (pager)

## 2018-05-27 NOTE — Assessment & Plan Note (Addendum)
This is a very pleasant 77 year old white male recently diagnosed with extensive stage (T4, N2, M1a) small cell lung cancer presented with large right perihilar mass with mediastinal invasion and loculated malignant right pleural effusion diagnosed in June 2019. He is currently on palliative systemic chemotherapy with carboplatin for AUC of 5 on day 1, etoposide 100 mg/M2 on days 1, 2 and 3 in addition to Tecentriq 1200 mg IV every 3 weeks with Neulasta support.  Status post 2 cycles which he is tolerating fairly well with no concerning complaints. Recommend for him to proceed with cycle 3 of his treatment today as scheduled.  Creatinine is 1.78 which is consistent with baseline.  Okay for him to proceed. He will have a restaging CT scan of the chest, abdomen, pelvis in approximately 3 weeks.  He will follow-up in 3 weeks for evaluation prior to cycle #4 of his treatment and to review his restaging CT scan results.  For the recurrent malignant pleural effusion, he has a Pleurx catheter in place.  It has not drained in several weeks.  We will consider having this removed by interventional radiology following the restaging CT scan.  For his left lower extremity DVT, he was advised to continue on Xarelto.  I have refilled this today.  He was advised not to hold this medication that calling us.  If he experiences any recurrent nosebleeds or any other bleeding, I have advised him to come in for evaluation.  He was advised to call immediately if he has any concerning symptoms in the interval.  The patient voices understanding of current disease status and treatment options and is in agreement with the current care plan.  All questions were answered. The patient knows to call the clinic with any problems, questions or concerns. We can certainly see the patient much sooner if necessary.

## 2018-05-28 ENCOUNTER — Inpatient Hospital Stay: Payer: Medicare HMO

## 2018-05-28 VITALS — BP 148/83 | HR 80 | Temp 97.9°F | Resp 16

## 2018-05-28 DIAGNOSIS — C342 Malignant neoplasm of middle lobe, bronchus or lung: Secondary | ICD-10-CM | POA: Diagnosis not present

## 2018-05-28 DIAGNOSIS — Z5112 Encounter for antineoplastic immunotherapy: Secondary | ICD-10-CM | POA: Diagnosis not present

## 2018-05-28 DIAGNOSIS — Z5111 Encounter for antineoplastic chemotherapy: Secondary | ICD-10-CM | POA: Diagnosis not present

## 2018-05-28 DIAGNOSIS — C3411 Malignant neoplasm of upper lobe, right bronchus or lung: Secondary | ICD-10-CM

## 2018-05-28 DIAGNOSIS — Z5189 Encounter for other specified aftercare: Secondary | ICD-10-CM | POA: Diagnosis not present

## 2018-05-28 DIAGNOSIS — Z79899 Other long term (current) drug therapy: Secondary | ICD-10-CM | POA: Diagnosis not present

## 2018-05-28 MED ORDER — ONDANSETRON HCL 4 MG/2ML IJ SOLN
INTRAMUSCULAR | Status: AC
  Filled 2018-05-28: qty 4

## 2018-05-28 MED ORDER — SODIUM CHLORIDE 0.9 % IV SOLN
Freq: Once | INTRAVENOUS | Status: AC
  Administered 2018-05-28: 14:00:00 via INTRAVENOUS
  Filled 2018-05-28: qty 250

## 2018-05-28 MED ORDER — DEXAMETHASONE SODIUM PHOSPHATE 10 MG/ML IJ SOLN
10.0000 mg | Freq: Once | INTRAMUSCULAR | Status: AC
  Administered 2018-05-28: 10 mg via INTRAVENOUS

## 2018-05-28 MED ORDER — DEXAMETHASONE SODIUM PHOSPHATE 10 MG/ML IJ SOLN
INTRAMUSCULAR | Status: AC
  Filled 2018-05-28: qty 1

## 2018-05-28 MED ORDER — SODIUM CHLORIDE 0.9 % IV SOLN
100.0000 mg/m2 | Freq: Once | INTRAVENOUS | Status: AC
  Administered 2018-05-28: 190 mg via INTRAVENOUS
  Filled 2018-05-28: qty 9.5

## 2018-05-28 MED ORDER — ONDANSETRON HCL 4 MG/2ML IJ SOLN
8.0000 mg | Freq: Once | INTRAMUSCULAR | Status: AC
  Administered 2018-05-28: 8 mg via INTRAVENOUS

## 2018-05-28 NOTE — Progress Notes (Signed)
Per progress note from 05/27/2018 okay to proceed with treatment today with creatinine levels.

## 2018-05-28 NOTE — Progress Notes (Signed)
Patient with nausea at end of etoposide infusion.  Verbal orders given from Mikey Bussing, NP for Zofran 8mg  IV one time.

## 2018-05-28 NOTE — Patient Instructions (Signed)
[place] Discharge Instructions for Patients Receiving Chemotherapy  Today you received the following chemotherapy agents Etoposide  To help prevent nausea and vomiting after your treatment, we encourage you to take your nausea medication as directed If you develop nausea and vomiting that is not controlled by your nausea medication, call the clinic.   BELOW ARE SYMPTOMS THAT SHOULD BE REPORTED IMMEDIATELY:  *FEVER GREATER THAN 100.5 F  *CHILLS WITH OR WITHOUT FEVER  NAUSEA AND VOMITING THAT IS NOT CONTROLLED WITH YOUR NAUSEA MEDICATION  *UNUSUAL SHORTNESS OF BREATH  *UNUSUAL BRUISING OR BLEEDING  TENDERNESS IN MOUTH AND THROAT WITH OR WITHOUT PRESENCE OF ULCERS  *URINARY PROBLEMS  *BOWEL PROBLEMS  UNUSUAL RASH Items with * indicate a potential emergency and should be followed up as soon as possible.  Feel free to call the clinic should you have any questions or concerns. The clinic phone number is [phone].  Please show the [place] at check-in to the Emergency Department and triage nurse.

## 2018-05-29 ENCOUNTER — Inpatient Hospital Stay: Payer: Medicare HMO | Attending: Internal Medicine

## 2018-05-29 VITALS — BP 159/87 | HR 67 | Temp 97.8°F | Resp 16

## 2018-05-29 DIAGNOSIS — Z5112 Encounter for antineoplastic immunotherapy: Secondary | ICD-10-CM | POA: Insufficient documentation

## 2018-05-29 DIAGNOSIS — C342 Malignant neoplasm of middle lobe, bronchus or lung: Secondary | ICD-10-CM | POA: Insufficient documentation

## 2018-05-29 DIAGNOSIS — Z79899 Other long term (current) drug therapy: Secondary | ICD-10-CM | POA: Diagnosis not present

## 2018-05-29 DIAGNOSIS — Z5111 Encounter for antineoplastic chemotherapy: Secondary | ICD-10-CM | POA: Diagnosis not present

## 2018-05-29 DIAGNOSIS — C3411 Malignant neoplasm of upper lobe, right bronchus or lung: Secondary | ICD-10-CM

## 2018-05-29 MED ORDER — SODIUM CHLORIDE 0.9 % IV SOLN
100.0000 mg/m2 | Freq: Once | INTRAVENOUS | Status: AC
  Administered 2018-05-29: 190 mg via INTRAVENOUS
  Filled 2018-05-29: qty 9.5

## 2018-05-29 MED ORDER — DEXAMETHASONE SODIUM PHOSPHATE 10 MG/ML IJ SOLN
INTRAMUSCULAR | Status: AC
  Filled 2018-05-29: qty 1

## 2018-05-29 MED ORDER — SODIUM CHLORIDE 0.9 % IV SOLN
Freq: Once | INTRAVENOUS | Status: AC
  Administered 2018-05-29: 11:00:00 via INTRAVENOUS
  Filled 2018-05-29: qty 250

## 2018-05-29 MED ORDER — DEXAMETHASONE SODIUM PHOSPHATE 10 MG/ML IJ SOLN
10.0000 mg | Freq: Once | INTRAMUSCULAR | Status: AC
Start: 2018-05-29 — End: 2018-05-29
  Administered 2018-05-29: 10 mg via INTRAVENOUS

## 2018-05-29 NOTE — Patient Instructions (Signed)
[place] Discharge Instructions for Patients Receiving Chemotherapy  Today you received the following chemotherapy agents Etoposide  To help prevent nausea and vomiting after your treatment, we encourage you to take your nausea medication as directed If you develop nausea and vomiting that is not controlled by your nausea medication, call the clinic.   BELOW ARE SYMPTOMS THAT SHOULD BE REPORTED IMMEDIATELY:  *FEVER GREATER THAN 100.5 F  *CHILLS WITH OR WITHOUT FEVER  NAUSEA AND VOMITING THAT IS NOT CONTROLLED WITH YOUR NAUSEA MEDICATION  *UNUSUAL SHORTNESS OF BREATH  *UNUSUAL BRUISING OR BLEEDING  TENDERNESS IN MOUTH AND THROAT WITH OR WITHOUT PRESENCE OF ULCERS  *URINARY PROBLEMS  *BOWEL PROBLEMS  UNUSUAL RASH Items with * indicate a potential emergency and should be followed up as soon as possible.  Feel free to call the clinic should you have any questions or concerns. The clinic phone number is [phone].  Please show the [place] at check-in to the Emergency Department and triage nurse.

## 2018-05-30 ENCOUNTER — Telehealth: Payer: Self-pay | Admitting: Cardiovascular Disease

## 2018-05-30 ENCOUNTER — Telehealth: Payer: Self-pay | Admitting: Medical Oncology

## 2018-05-30 ENCOUNTER — Ambulatory Visit: Payer: Medicare HMO

## 2018-05-30 MED ORDER — PEGFILGRASTIM-CBQV 6 MG/0.6ML ~~LOC~~ SOSY
PREFILLED_SYRINGE | SUBCUTANEOUS | Status: AC
  Filled 2018-05-30: qty 0.6

## 2018-05-30 NOTE — Telephone Encounter (Signed)
cannot make injection appt today ,please reschedule for tomorrow. Done and wife notified.

## 2018-05-30 NOTE — Telephone Encounter (Signed)
Called patient regarding his BP. Stated that he has just recently started having issues with his BP, he was taken off of a medication in the hospital and would like to discuss going back on it due to the BP increasing. Yesterday BP was 160/94, patient states he has no other symptoms, but would like to get this under control.  I scheduled patient with soonest appointment with Fabian Sharp, PA and advised patient to begin to check BP x2 daily and to record the BP and bring them into his visit. Also advised patient to watch his diet, and salt intake in the meantime if he began to have symptoms that worsened with increase BP to call back or go to ER.  Patient understood.

## 2018-05-30 NOTE — Telephone Encounter (Signed)
New message   Pt c/o BP issue:  1. What are your last 5 BP readings? 160/97 2. Are you having any other symptoms (ex. Dizziness, headache, blurred vision, passed out)? no 3. What is your medication issue? Patient wants to start taking BP medication

## 2018-05-31 ENCOUNTER — Ambulatory Visit: Payer: Medicare HMO

## 2018-05-31 ENCOUNTER — Inpatient Hospital Stay: Payer: Medicare HMO

## 2018-05-31 VITALS — BP 154/82 | HR 79 | Temp 97.4°F | Resp 18

## 2018-05-31 DIAGNOSIS — Z79899 Other long term (current) drug therapy: Secondary | ICD-10-CM | POA: Diagnosis not present

## 2018-05-31 DIAGNOSIS — C3411 Malignant neoplasm of upper lobe, right bronchus or lung: Secondary | ICD-10-CM

## 2018-05-31 DIAGNOSIS — Z5111 Encounter for antineoplastic chemotherapy: Secondary | ICD-10-CM | POA: Diagnosis not present

## 2018-05-31 DIAGNOSIS — Z5112 Encounter for antineoplastic immunotherapy: Secondary | ICD-10-CM | POA: Diagnosis not present

## 2018-05-31 DIAGNOSIS — C342 Malignant neoplasm of middle lobe, bronchus or lung: Secondary | ICD-10-CM | POA: Diagnosis not present

## 2018-05-31 MED ORDER — PEGFILGRASTIM-CBQV 6 MG/0.6ML ~~LOC~~ SOSY
6.0000 mg | PREFILLED_SYRINGE | Freq: Once | SUBCUTANEOUS | Status: AC
  Administered 2018-05-31: 6 mg via SUBCUTANEOUS

## 2018-05-31 MED ORDER — PEGFILGRASTIM-CBQV 6 MG/0.6ML ~~LOC~~ SOSY
PREFILLED_SYRINGE | SUBCUTANEOUS | Status: AC
  Filled 2018-05-31: qty 0.6

## 2018-05-31 NOTE — Patient Instructions (Signed)
Pegfilgrastim injection What is this medicine? PEGFILGRASTIM (PEG fil gra stim) is a long-acting granulocyte colony-stimulating factor that stimulates the growth of neutrophils, a type of white blood cell important in the body's fight against infection. It is used to reduce the incidence of fever and infection in patients with certain types of cancer who are receiving chemotherapy that affects the bone marrow, and to increase survival after being exposed to high doses of radiation. This medicine may be used for other purposes; ask your health care provider or pharmacist if you have questions. COMMON BRAND NAME(S): Neulasta What should I tell my health care provider before I take this medicine? They need to know if you have any of these conditions: -kidney disease -latex allergy -ongoing radiation therapy -sickle cell disease -skin reactions to acrylic adhesives (On-Body Injector only) -an unusual or allergic reaction to pegfilgrastim, filgrastim, other medicines, foods, dyes, or preservatives -pregnant or trying to get pregnant -breast-feeding How should I use this medicine? This medicine is for injection under the skin. If you get this medicine at home, you will be taught how to prepare and give the pre-filled syringe or how to use the On-body Injector. Refer to the patient Instructions for Use for detailed instructions. Use exactly as directed. Tell your healthcare provider immediately if you suspect that the On-body Injector may not have performed as intended or if you suspect the use of the On-body Injector resulted in a missed or partial dose. It is important that you put your used needles and syringes in a special sharps container. Do not put them in a trash can. If you do not have a sharps container, call your pharmacist or healthcare provider to get one. Talk to your pediatrician regarding the use of this medicine in children. While this drug may be prescribed for selected conditions,  precautions do apply. Overdosage: If you think you have taken too much of this medicine contact a poison control center or emergency room at once. NOTE: This medicine is only for you. Do not share this medicine with others. What if I miss a dose? It is important not to miss your dose. Call your doctor or health care professional if you miss your dose. If you miss a dose due to an On-body Injector failure or leakage, a new dose should be administered as soon as possible using a single prefilled syringe for manual use. What may interact with this medicine? Interactions have not been studied. Give your health care provider a list of all the medicines, herbs, non-prescription drugs, or dietary supplements you use. Also tell them if you smoke, drink alcohol, or use illegal drugs. Some items may interact with your medicine. This list may not describe all possible interactions. Give your health care provider a list of all the medicines, herbs, non-prescription drugs, or dietary supplements you use. Also tell them if you smoke, drink alcohol, or use illegal drugs. Some items may interact with your medicine. What should I watch for while using this medicine? You may need blood work done while you are taking this medicine. If you are going to need a MRI, CT scan, or other procedure, tell your doctor that you are using this medicine (On-Body Injector only). What side effects may I notice from receiving this medicine? Side effects that you should report to your doctor or health care professional as soon as possible: -allergic reactions like skin rash, itching or hives, swelling of the face, lips, or tongue -dizziness -fever -pain, redness, or irritation at site   where injected -pinpoint red spots on the skin -red or dark-brown urine -shortness of breath or breathing problems -stomach or side pain, or pain at the shoulder -swelling -tiredness -trouble passing urine or change in the amount of urine Side  effects that usually do not require medical attention (report to your doctor or health care professional if they continue or are bothersome): -bone pain -muscle pain This list may not describe all possible side effects. Call your doctor for medical advice about side effects. You may report side effects to FDA at 1-800-FDA-1088. Where should I keep my medicine? Keep out of the reach of children. Store pre-filled syringes in a refrigerator between 2 and 8 degrees C (36 and 46 degrees F). Do not freeze. Keep in carton to protect from light. Throw away this medicine if it is left out of the refrigerator for more than 48 hours. Throw away any unused medicine after the expiration date. NOTE: This sheet is a summary. It may not cover all possible information. If you have questions about this medicine, talk to your doctor, pharmacist, or health care provider.  2018 Elsevier/Gold Standard (2016-10-11 12:58:03)  

## 2018-06-03 ENCOUNTER — Inpatient Hospital Stay: Payer: Medicare HMO

## 2018-06-03 DIAGNOSIS — C342 Malignant neoplasm of middle lobe, bronchus or lung: Secondary | ICD-10-CM | POA: Diagnosis not present

## 2018-06-03 DIAGNOSIS — Z5111 Encounter for antineoplastic chemotherapy: Secondary | ICD-10-CM | POA: Diagnosis not present

## 2018-06-03 DIAGNOSIS — C3411 Malignant neoplasm of upper lobe, right bronchus or lung: Secondary | ICD-10-CM

## 2018-06-03 DIAGNOSIS — Z79899 Other long term (current) drug therapy: Secondary | ICD-10-CM | POA: Diagnosis not present

## 2018-06-03 DIAGNOSIS — Z5112 Encounter for antineoplastic immunotherapy: Secondary | ICD-10-CM | POA: Diagnosis not present

## 2018-06-03 LAB — CBC WITH DIFFERENTIAL (CANCER CENTER ONLY)
BASOS ABS: 0 10*3/uL (ref 0.0–0.1)
Basophils Relative: 0 %
EOS ABS: 0 10*3/uL (ref 0.0–0.5)
EOS PCT: 0 %
HCT: 25.1 % — ABNORMAL LOW (ref 38.4–49.9)
Hemoglobin: 8.2 g/dL — ABNORMAL LOW (ref 13.0–17.1)
Lymphocytes Relative: 5 %
Lymphs Abs: 0.7 10*3/uL — ABNORMAL LOW (ref 0.9–3.3)
MCH: 30 pg (ref 27.2–33.4)
MCHC: 32.8 g/dL (ref 32.0–36.0)
MCV: 91.4 fL (ref 79.3–98.0)
MONO ABS: 0.1 10*3/uL (ref 0.1–0.9)
Monocytes Relative: 1 %
Neutro Abs: 14.6 10*3/uL — ABNORMAL HIGH (ref 1.5–6.5)
Neutrophils Relative %: 94 %
PLATELETS: 201 10*3/uL (ref 140–400)
RBC: 2.74 MIL/uL — AB (ref 4.20–5.82)
RDW: 18.4 % — AB (ref 11.0–14.6)
WBC: 15.5 10*3/uL — AB (ref 4.0–10.3)

## 2018-06-03 LAB — CMP (CANCER CENTER ONLY)
ALT: 6 U/L (ref 0–44)
AST: 9 U/L — AB (ref 15–41)
Albumin: 2.8 g/dL — ABNORMAL LOW (ref 3.5–5.0)
Alkaline Phosphatase: 112 U/L (ref 38–126)
Anion gap: 6 (ref 5–15)
BUN: 36 mg/dL — ABNORMAL HIGH (ref 8–23)
CHLORIDE: 109 mmol/L (ref 98–111)
CO2: 24 mmol/L (ref 22–32)
CREATININE: 1.71 mg/dL — AB (ref 0.61–1.24)
Calcium: 8.7 mg/dL — ABNORMAL LOW (ref 8.9–10.3)
GFR, EST AFRICAN AMERICAN: 43 mL/min — AB (ref >60)
GFR, Estimated: 37 mL/min — ABNORMAL LOW (ref >60)
Glucose, Bld: 126 mg/dL — ABNORMAL HIGH (ref 70–99)
POTASSIUM: 4.7 mmol/L (ref 3.5–5.1)
SODIUM: 139 mmol/L (ref 135–145)
Total Bilirubin: 0.5 mg/dL (ref 0.3–1.2)
Total Protein: 6 g/dL — ABNORMAL LOW (ref 6.5–8.1)

## 2018-06-06 ENCOUNTER — Telehealth: Payer: Self-pay | Admitting: Internal Medicine

## 2018-06-06 NOTE — Telephone Encounter (Signed)
Spoke with patient regarding appointment added per 8/9 staff message

## 2018-06-10 ENCOUNTER — Inpatient Hospital Stay: Payer: Medicare HMO

## 2018-06-10 DIAGNOSIS — C3411 Malignant neoplasm of upper lobe, right bronchus or lung: Secondary | ICD-10-CM

## 2018-06-10 DIAGNOSIS — Z5112 Encounter for antineoplastic immunotherapy: Secondary | ICD-10-CM | POA: Diagnosis not present

## 2018-06-10 DIAGNOSIS — C342 Malignant neoplasm of middle lobe, bronchus or lung: Secondary | ICD-10-CM | POA: Diagnosis not present

## 2018-06-10 DIAGNOSIS — Z79899 Other long term (current) drug therapy: Secondary | ICD-10-CM | POA: Diagnosis not present

## 2018-06-10 DIAGNOSIS — Z5111 Encounter for antineoplastic chemotherapy: Secondary | ICD-10-CM | POA: Diagnosis not present

## 2018-06-10 LAB — CMP (CANCER CENTER ONLY)
ALT: 8 U/L (ref 0–44)
ANION GAP: 10 (ref 5–15)
AST: 10 U/L — ABNORMAL LOW (ref 15–41)
Albumin: 2.9 g/dL — ABNORMAL LOW (ref 3.5–5.0)
Alkaline Phosphatase: 129 U/L — ABNORMAL HIGH (ref 38–126)
BILIRUBIN TOTAL: 0.2 mg/dL — AB (ref 0.3–1.2)
BUN: 17 mg/dL (ref 8–23)
CO2: 23 mmol/L (ref 22–32)
Calcium: 9 mg/dL (ref 8.9–10.3)
Chloride: 108 mmol/L (ref 98–111)
Creatinine: 1.56 mg/dL — ABNORMAL HIGH (ref 0.61–1.24)
GFR, EST NON AFRICAN AMERICAN: 41 mL/min — AB (ref >60)
GFR, Est AFR Am: 48 mL/min — ABNORMAL LOW (ref >60)
Glucose, Bld: 98 mg/dL (ref 70–99)
Potassium: 4.5 mmol/L (ref 3.5–5.1)
Sodium: 141 mmol/L (ref 135–145)
TOTAL PROTEIN: 6.5 g/dL (ref 6.5–8.1)

## 2018-06-10 LAB — CBC WITH DIFFERENTIAL (CANCER CENTER ONLY)
BASOS ABS: 0.1 10*3/uL (ref 0.0–0.1)
Basophils Relative: 0 %
Eosinophils Absolute: 0.2 10*3/uL (ref 0.0–0.5)
Eosinophils Relative: 1 %
HEMATOCRIT: 26.5 % — AB (ref 38.4–49.9)
HEMOGLOBIN: 8.3 g/dL — AB (ref 13.0–17.1)
Lymphocytes Relative: 9 %
Lymphs Abs: 1.7 10*3/uL (ref 0.9–3.3)
MCH: 30 pg (ref 27.2–33.4)
MCHC: 31.3 g/dL — ABNORMAL LOW (ref 32.0–36.0)
MCV: 95.7 fL (ref 79.3–98.0)
Monocytes Absolute: 1.8 10*3/uL — ABNORMAL HIGH (ref 0.1–0.9)
Monocytes Relative: 10 %
NEUTROS ABS: 14.8 10*3/uL — AB (ref 1.5–6.5)
Neutrophils Relative %: 80 %
Platelet Count: 74 10*3/uL — ABNORMAL LOW (ref 140–400)
RBC: 2.77 MIL/uL — AB (ref 4.20–5.82)
RDW: 19.5 % — ABNORMAL HIGH (ref 11.0–14.6)
WBC: 18.6 10*3/uL — AB (ref 4.0–10.3)

## 2018-06-11 NOTE — Progress Notes (Signed)
Cardiology Office Note:    Date:  06/12/2018   ID:  Christopher Wilson, DOB 22-Feb-1941, MRN [id number]  PCP:  Patient, No Pcp Per  Cardiologist:  Shelva Majestic, MD   Referring MD: No ref. provider found   Chief Complaint  Patient presents with  . Hypertension    History of Present Illness:    Christopher Wilson is a 77 y.o. male with a hx of HTN, CAD s/p CABG (1989 at [facility]) and BMS to proximal RCA (1999). 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 Nils Pyle). In 2006 he had stenting of his left main and diagonal and in 2007 stenting of the SVG to Cx. Subsequent heart cath showed total occlusion of the vein graft.  Heart cath in 2010 showed an ejection fraction of 35 to 40%. In August 2013 he presented with unstable angina and ST changes prompting repeat heart catheterization.  He had significant native CAD with a patent left main stent and mild ostial 20% in-stent narrowing, a patent stent in the diagonal vessel of the LAD, occlusion of the native circumflex and total occlusion of the ostial RCA.  There was a widely patent vein graft supplying the RCA and widely patent LIMA graft he has a solitary kidney.  He continues to smoke and is smoking 1 pack/day.  He is status post carotid endarterectomy with a patent left carotid endarterectomy in October 2014.  He has a 50 to 69% right carotid stenosis.  He was last seen in clinic with Dr. Claiborne Billings on 03/21/2018.  At that time he reported an intentional weight loss.  He continued to smoke.  Aggressive medical therapy at that visit was noted to be: 80 mg Lipitor, 25 mg Lopressor twice daily Xarelto and Lasix 40 mg as needed.  In July 2019, the patient called reporting swelling in his feet.  Lower extremity Doppler revealed acute DVT in the left popliteal vein.  He was started on treatment dose Xarelto with transition to 20 mg Xarelto daily.  He was scheduled to have a repeat echocardiogram following his last clinic visit, this has not been  completed.  Plavix was discontinued in the setting of Xarelto.    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  He is being followed by medical oncology and is undergoing palliative systemic chemotherapy.  He also had a Pleurx catheter in place.  Earlier this month, the patient called stating his blood pressure had been elevated to 160/97.  He presents today for follow-up of his blood pressure.  He presents today and is here alone.  His main complaint is he still has lower extremity swelling.  He is requesting a refill on his Lasix.  He states that he had some minor nosebleeding after starting the 15 mg twice daily Xarelto.  He stopped taking the Xarelto for a couple of days, then resume 15 mg once daily.  He is now transition to the 20 mg daily Xarelto and has had no further bleeding problems.  He denies orthopnea, dyspnea on exertion, chest pain, palpitations, and syncope.  He is not having bleeding problems on Xarelto currently.  He states that at his oncology visits his pressure has been elevated at the cancer center.  Today his blood pressure is 136/78.  He denies changes in vision, stroke-like symptoms, limb weakness.  He continues to be fatigued, likely secondary from the cancer.  He is eating well while living with his daughter.   Past Medical History:  Diagnosis Date  . ACS (acute coronary syndrome), with ST elevation but stable coronary arteries on cath 06/27/12 06/27/2012  . CAD (coronary artery disease), with CABG 1989 and re-do Cabg 2002 X 3 vesssels.  progressive disease with Stent to Lt. Main, and 1st diag 2006, stent to VG to LCX 2007, with known occlusion history o  06/27/2012  . CKD (chronic kidney disease) stage 3, GFR 30-59 ml/min (HCC) 06/27/2012  . 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 Coronary artery disease   . GERD  (gastroesophageal reflux disease)   . Hyperlipidemia LDL goal < 70 06/27/2012  . Hypertension   . Myocardial infarction ([place])   . Peptic ulcer   . STEMI (ST elevation myocardial infarction) ([place]) 06/27/2012  . Tobacco abuse 06/27/2012    Past Surgical History:  Procedure Laterality Date  . coratid arterty surgery    . CORONARY ANGIOPLASTY WITH STENT PLACEMENT    . CORONARY ARTERY BYPASS GRAFT    . IR GUIDED DRAIN W CATHETER PLACEMENT  04/08/2018  . LCEA    . LEFT HEART CATH N/A 06/27/2012   Procedure: LEFT HEART CATH;  Surgeon: Troy Sine, MD;  Location: [facility] CATH LAB;  Service: Cardiovascular;  Laterality: N/A;  . triple bipass     Hx of CABG and RE do  . VASCULAR SURGERY    . VIDEO BRONCHOSCOPY Bilateral 04/07/2018   Procedure: VIDEO BRONCHOSCOPY WITHOUT FLUORO;  Surgeon: Rush Farmer, MD;  Location: [facility] ENDOSCOPY;  Service: Cardiopulmonary;  Laterality: Bilateral;    Current Medications: Current Meds  Medication Sig  . atorvastatin (LIPITOR) 80 MG tablet TAKE ONE TABLET BY MOUTH DAILY AT 6 P.M. NEEDS APPOINTMENT FOR FUTURE REFILLS.  Marland Kitchen metoprolol tartrate (LOPRESSOR) 25 MG tablet TAKE ONE TABLET BY MOUTH TWICE DAILY (PLEASE  CONTACT  OFFICE  FOR  ADDITIONAL  REFILLS  ) (Patient taking differently: TAKE ONE TABLET BY MOUTH DAILY (PLEASE  CONTACT  OFFICE  FOR  ADDITIONAL  REFILLS  ))  . nitroGLYCERIN (NITROSTAT) 0.4 MG SL tablet Place 1 tablet (0.4 mg total) under the tongue every 5 (five) minutes as needed for chest pain.  . rivaroxaban (XARELTO) 20 MG TABS tablet Take 1 tablet (20 mg total) by mouth daily with supper.     Allergies:   Patient has no known allergies.   Social History   Socioeconomic History  . Marital status: Divorced    Spouse name: Not on file  . Number of children: Not on file  . Years of education: Not on file  . Highest education level: Not on file  Occupational History  . Not on file  Social Needs  . Financial resource strain: Not on file  . Food  insecurity:    Worry: Not on file    Inability: Not on file  . Transportation needs:    Medical: Not on file    Non-medical: Not on file  Tobacco Use  . Smoking status: Current Every Day Smoker    Packs/day: 1.50  . Smokeless tobacco: Never Used  Substance and Sexual Activity  . Alcohol use: Yes    Alcohol/week: 10.0 standard drinks    Types: 10 Cans of beer per week  . Drug use: No  . Sexual activity: Not on file  Lifestyle  . Physical activity:    Days per week: Not on file  Minutes per session: Not on file  . Stress: Not on file  Relationships  . Social connections:    Talks on phone: Not on file    Gets together: Not on file    Attends religious service: Not on file    Active member of club or organization: Not on file    Attends meetings of clubs or organizations: Not on file    Relationship status: Not on file  Other Topics Concern  . Not on file  Social History Narrative  . Not on file     Family History: The patient's family history includes Heart attack in his father; Stroke in his maternal aunt and mother.  ROS:   Please see the history of present illness.     All other systems reviewed and are negative.  EKGs/Labs/Other Studies Reviewed:    The following studies were reviewed today:  Myoview 07/17/17  The left ventricular ejection fraction is mildly decreased (45-54%).  Nuclear stress EF: 53%. There is inferolateral severe hypokinesis and inferior akinesis.  Blood pressure demonstrated a normal response to exercise.  There was no ST segment deviation noted during stress.  There is a small defect of severe severity present in the basal inferior and mid inferior location. This is a fixed defect and consistent with prior infarct. No ischemia noted.  There is a medium defect of moderate severity present in the basal inferolateral, basal anterolateral, mid inferolateral and mid anterolateral location. This is consistent with prior infarct. No ischemia  noted.  This is an intermediate risk study.   EKG:  EKG is not ordered today.    Recent Labs: 03/31/2018: B Natriuretic Peptide 104.4 05/27/2018: TSH 0.943 06/10/2018: ALT 8; BUN 17; Creatinine 1.56; Hemoglobin 8.3; Platelet Count 74; Potassium 4.5; Sodium 141  Recent Lipid Panel    Component Value Date/Time   CHOL 116 04/01/2018 0256   CHOL 171 03/21/2018 1048   TRIG 84 03/21/2018 1048   HDL 41 03/21/2018 1048   CHOLHDL 4.2 03/21/2018 1048   CHOLHDL 3.6 09/06/2014 1033   VLDL 19 09/06/2014 1033   LDLCALC 113 (H) 03/21/2018 1048    Physical Exam:    VS:  BP 136/78   Pulse 95   Ht 5\' 10"  (1.778 m)   Wt 155 lb 3.2 oz (70.4 kg)   BMI 22.27 kg/m     Wt Readings from Last 3 Encounters:  06/12/18 155 lb 3.2 oz (70.4 kg)  05/27/18 154 lb 4.8 oz (70 kg)  05/10/18 148 lb (67.1 kg)     GEN: Well nourished, well developed in no acute distress HEENT: Normal NECK: No JVD CARDIAC: RRR, no murmurs, rubs, gallops RESPIRATORY:  Respirations unlabored, but prolonged expiratory phase, rhonchi in bases  ABDOMEN: Soft, non-tender, non-distended MUSCULOSKELETAL:  Trace to 1+ B LE edema; No deformity  SKIN: Warm and dry NEUROLOGIC:  Alert and oriented x 3 PSYCHIATRIC:  Normal affect   ASSESSMENT:    1. Essential hypertension   2. Coronary artery disease involving native coronary artery of native heart without angina pectoris   3. Acute deep vein thrombosis (DVT) of distal vein of left lower extremity (HCC)   4. CKD (chronic kidney disease) stage 3, GFR 30-59 ml/min (HCC)   5. Hyperlipidemia with target LDL less than 70    PLAN:    In order of problems listed above:  Essential hypertension He is not taking his blood pressure at home because he thinks that his blood pressure machine is not calibrated.  He states that blood pressure checks at the cancer center have been elevated, 867Y systolic.  He is reporting no symptoms of hypertensive urgency.  He has taken Imdur in the past and  was recently taken off of Imdur in the hospital.  I will restart Imdur at 30 mg daily.  He will call if his blood pressure continues to be elevated and we can make adjustments over the phone.  Coronary artery disease involving native coronary artery of native heart without angina pectoris Stable.  No chest pain.  Acute deep vein thrombosis (DVT) of distal vein of left lower extremity (HCC) He is now taking Xarelto 20 mg daily without significant bleeding problems.  CBC 2 days ago with a hemoglobin 8.3, which has been his baseline.  CKD (chronic kidney disease) stage 3, GFR 30-59 ml/min (HCC) Given his solitary kidney and renal disease stage III, I will hold off on ordering Lasix until I get the results from his echocardiogram.  He has taken 40 mg of Lasix in the past.  If his EF comes back reduced, he will need close follow-up of his renal function after starting a diuretic.  He reports no orthopnea or dyspnea on exertion.  I suspect this lower extremity swelling can be managed outpatient.  Renal function 2 days ago with serum creatinine of 1.56 and a potassium of 4.5.  If his echo shows reduced left ventricular function, would start conservatively with 20 mg of Lasix.  Hyperlipidemia 03/21/2018: HDL 41; LDL Calculated 113; Triglycerides 84 04/01/2018: Cholesterol 116 His LDL goal is less than 70.  He is currently on 80 mg of Lipitor.  He has a lot going on while undergoing cancer treatment at this time.  We will hold off on a lipid clinic referral for [place].   I would like to see him back 1 to 2 weeks after his echocardiogram is completed.   Medication Adjustments/Labs and Tests Ordered: Current medicines are reviewed at length with the patient today.  Concerns regarding medicines are outlined above.  No orders of the defined types were placed in this encounter.  Meds ordered this encounter  Medications  . isosorbide mononitrate (IMDUR) 30 MG 24 hr tablet    Sig: Take 1 tablet (30 mg  total) by mouth daily.    Dispense:  90 tablet    Refill:  3    Signed, Ledora Bottcher, [place]  06/12/2018 8:28 AM     [facility]

## 2018-06-12 ENCOUNTER — Ambulatory Visit: Payer: Medicare HMO | Admitting: Physician Assistant

## 2018-06-12 ENCOUNTER — Encounter: Payer: Self-pay | Admitting: Physician Assistant

## 2018-06-12 VITALS — BP 136/78 | HR 95 | Ht 70.0 in | Wt 155.2 lb

## 2018-06-12 DIAGNOSIS — I10 Essential (primary) hypertension: Secondary | ICD-10-CM

## 2018-06-12 DIAGNOSIS — E785 Hyperlipidemia, unspecified: Secondary | ICD-10-CM | POA: Diagnosis not present

## 2018-06-12 DIAGNOSIS — I2510 Atherosclerotic heart disease of native coronary artery without angina pectoris: Secondary | ICD-10-CM

## 2018-06-12 DIAGNOSIS — N183 Chronic kidney disease, stage 3 unspecified: Secondary | ICD-10-CM

## 2018-06-12 DIAGNOSIS — R0600 Dyspnea, unspecified: Secondary | ICD-10-CM | POA: Diagnosis not present

## 2018-06-12 DIAGNOSIS — I824Z2 Acute embolism and thrombosis of unspecified deep veins of left distal lower extremity: Secondary | ICD-10-CM

## 2018-06-12 MED ORDER — ISOSORBIDE MONONITRATE ER 30 MG PO TB24: 30.0000 mg | ORAL_TABLET | Freq: Every day | ORAL | 3 refills | Status: DC

## 2018-06-12 NOTE — Patient Instructions (Signed)
Medication Instructions:  Your physician has recommended you make the following change in your medication:  START Imdur 30mg  daily. An Rx has been sent to your pharmacy   Labwork: None today  Testing/Procedures: Your physician has requested that you have an echocardiogram. Echocardiography is a painless test that uses sound waves to create images of your heart. It provides your doctor with information about the size and shape of your heart and how well your heart's chambers and valves are working. This procedure takes approximately one hour. There are no restrictions for this procedure.    Follow-Up: Your physician recommends that you schedule a follow-up appointment in: 2 weeks after echo with Doreene Adas, PA   Any Other Special Instructions Will Be Listed Below (If Applicable).     If you need a refill on your cardiac medications before your next appointment, please call your pharmacy.

## 2018-06-12 NOTE — Addendum Note (Signed)
Addended by: Lamar Laundry on: 06/12/2018 08:35 AM   Modules accepted: Orders

## 2018-06-13 ENCOUNTER — Other Ambulatory Visit: Payer: Self-pay | Admitting: Medical Oncology

## 2018-06-13 ENCOUNTER — Other Ambulatory Visit: Payer: Self-pay | Admitting: Cardiovascular Disease

## 2018-06-13 ENCOUNTER — Ambulatory Visit (HOSPITAL_COMMUNITY)
Admission: RE | Admit: 2018-06-13 | Discharge: 2018-06-13 | Disposition: A | Discharge status: Home / Self Care | Payer: Medicare HMO | Source: Ambulatory Visit | Attending: Oncology | Admitting: Oncology

## 2018-06-13 ENCOUNTER — Encounter (HOSPITAL_COMMUNITY): Payer: Self-pay

## 2018-06-13 DIAGNOSIS — I700 Atherosclerosis of aorta: Secondary | ICD-10-CM | POA: Insufficient documentation

## 2018-06-13 DIAGNOSIS — I714 Abdominal aortic aneurysm, without rupture: Secondary | ICD-10-CM | POA: Insufficient documentation

## 2018-06-13 DIAGNOSIS — C3411 Malignant neoplasm of upper lobe, right bronchus or lung: Secondary | ICD-10-CM

## 2018-06-13 DIAGNOSIS — N200 Calculus of kidney: Secondary | ICD-10-CM | POA: Diagnosis not present

## 2018-06-13 DIAGNOSIS — N400 Enlarged prostate without lower urinary tract symptoms: Secondary | ICD-10-CM | POA: Diagnosis not present

## 2018-06-13 DIAGNOSIS — I6523 Occlusion and stenosis of bilateral carotid arteries: Secondary | ICD-10-CM

## 2018-06-13 DIAGNOSIS — J439 Emphysema, unspecified: Secondary | ICD-10-CM | POA: Diagnosis not present

## 2018-06-13 DIAGNOSIS — N261 Atrophy of kidney (terminal): Secondary | ICD-10-CM | POA: Diagnosis not present

## 2018-06-13 DIAGNOSIS — K8020 Calculus of gallbladder without cholecystitis without obstruction: Secondary | ICD-10-CM | POA: Insufficient documentation

## 2018-06-13 DIAGNOSIS — K5730 Diverticulosis of large intestine without perforation or abscess without bleeding: Secondary | ICD-10-CM | POA: Diagnosis not present

## 2018-06-13 DIAGNOSIS — C3491 Malignant neoplasm of unspecified part of right bronchus or lung: Secondary | ICD-10-CM | POA: Diagnosis not present

## 2018-06-13 DIAGNOSIS — Z5111 Encounter for antineoplastic chemotherapy: Secondary | ICD-10-CM | POA: Diagnosis not present

## 2018-06-13 DIAGNOSIS — J90 Pleural effusion, not elsewhere classified: Secondary | ICD-10-CM | POA: Insufficient documentation

## 2018-06-13 MED ORDER — IOHEXOL 300 MG/ML  SOLN
100.0000 mL | Freq: Once | INTRAMUSCULAR | Status: AC | PRN
  Administered 2018-06-13: 80 mL via INTRAVENOUS

## 2018-06-16 ENCOUNTER — Inpatient Hospital Stay (HOSPITAL_BASED_OUTPATIENT_CLINIC_OR_DEPARTMENT_OTHER): Payer: Medicare HMO | Admitting: Internal Medicine

## 2018-06-16 ENCOUNTER — Other Ambulatory Visit: Payer: Self-pay | Admitting: Medical Oncology

## 2018-06-16 ENCOUNTER — Encounter: Payer: Self-pay | Admitting: Internal Medicine

## 2018-06-16 ENCOUNTER — Inpatient Hospital Stay: Payer: Medicare HMO

## 2018-06-16 ENCOUNTER — Ambulatory Visit: Payer: Medicare HMO | Admitting: Internal Medicine

## 2018-06-16 ENCOUNTER — Telehealth: Payer: Self-pay | Admitting: Internal Medicine

## 2018-06-16 VITALS — BP 152/87 | HR 85 | Temp 98.1°F | Resp 16 | Ht 70.0 in | Wt 156.6 lb

## 2018-06-16 DIAGNOSIS — C342 Malignant neoplasm of middle lobe, bronchus or lung: Secondary | ICD-10-CM

## 2018-06-16 DIAGNOSIS — Z5111 Encounter for antineoplastic chemotherapy: Secondary | ICD-10-CM | POA: Diagnosis not present

## 2018-06-16 DIAGNOSIS — C3411 Malignant neoplasm of upper lobe, right bronchus or lung: Secondary | ICD-10-CM

## 2018-06-16 DIAGNOSIS — Z79899 Other long term (current) drug therapy: Secondary | ICD-10-CM | POA: Diagnosis not present

## 2018-06-16 DIAGNOSIS — Z5112 Encounter for antineoplastic immunotherapy: Secondary | ICD-10-CM | POA: Diagnosis not present

## 2018-06-16 DIAGNOSIS — I10 Essential (primary) hypertension: Secondary | ICD-10-CM | POA: Diagnosis not present

## 2018-06-16 DIAGNOSIS — R5382 Chronic fatigue, unspecified: Secondary | ICD-10-CM

## 2018-06-16 LAB — CMP (CANCER CENTER ONLY)
ALT: 8 U/L (ref 0–44)
AST: 10 U/L — AB (ref 15–41)
Albumin: 2.9 g/dL — ABNORMAL LOW (ref 3.5–5.0)
Alkaline Phosphatase: 114 U/L (ref 38–126)
Anion gap: 7 (ref 5–15)
BUN: 17 mg/dL (ref 8–23)
CO2: 25 mmol/L (ref 22–32)
Calcium: 9.4 mg/dL (ref 8.9–10.3)
Chloride: 108 mmol/L (ref 98–111)
Creatinine: 1.73 mg/dL — ABNORMAL HIGH (ref 0.61–1.24)
GFR, EST NON AFRICAN AMERICAN: 37 mL/min — AB (ref >60)
GFR, Est AFR Am: 42 mL/min — ABNORMAL LOW (ref >60)
Glucose, Bld: 100 mg/dL — ABNORMAL HIGH (ref 70–99)
POTASSIUM: 5.1 mmol/L (ref 3.5–5.1)
Sodium: 140 mmol/L (ref 135–145)
Total Bilirubin: 0.4 mg/dL (ref 0.3–1.2)
Total Protein: 7.2 g/dL (ref 6.5–8.1)

## 2018-06-16 LAB — CBC WITH DIFFERENTIAL (CANCER CENTER ONLY)
Basophils Absolute: 0 10*3/uL (ref 0.0–0.1)
Basophils Relative: 0 %
EOS ABS: 0.1 10*3/uL (ref 0.0–0.5)
EOS PCT: 1 %
HCT: 27.7 % — ABNORMAL LOW (ref 38.4–49.9)
Hemoglobin: 8.5 g/dL — ABNORMAL LOW (ref 13.0–17.1)
Lymphocytes Relative: 8 %
Lymphs Abs: 1.4 10*3/uL (ref 0.9–3.3)
MCH: 29.8 pg (ref 27.2–33.4)
MCHC: 30.7 g/dL — ABNORMAL LOW (ref 32.0–36.0)
MCV: 97.2 fL (ref 79.3–98.0)
Monocytes Absolute: 1.4 10*3/uL — ABNORMAL HIGH (ref 0.1–0.9)
Monocytes Relative: 8 %
Neutro Abs: 14.2 10*3/uL — ABNORMAL HIGH (ref 1.5–6.5)
Neutrophils Relative %: 83 %
PLATELETS: 292 10*3/uL (ref 140–400)
RBC: 2.85 MIL/uL — AB (ref 4.20–5.82)
RDW: 19.7 % — AB (ref 11.0–14.6)
WBC: 17.1 10*3/uL — AB (ref 4.0–10.3)

## 2018-06-16 LAB — TSH: TSH: 0.499 u[IU]/mL (ref 0.320–4.118)

## 2018-06-16 NOTE — Patient Instructions (Signed)
Steps to Quit Smoking Smoking tobacco can be bad for your health. It can also affect almost every organ in your body. Smoking puts you and people around you at risk for many serious long-lasting (chronic) diseases. Quitting smoking is hard, but it is one of the best things that you can do for your health. It is never too late to quit. What are the benefits of quitting smoking? When you quit smoking, you lower your risk for getting serious diseases and conditions. They can include:  Lung cancer or lung disease.  Heart disease.  Stroke.  Heart attack.  Not being able to have children (infertility).  Weak bones (osteoporosis) and broken bones (fractures).  If you have coughing, wheezing, and shortness of breath, those symptoms may get better when you quit. You may also get sick less often. If you are pregnant, quitting smoking can help to lower your chances of having a baby of low birth weight. What can I do to help me quit smoking? Talk with your doctor about what can help you quit smoking. Some things you can do (strategies) include:  Quitting smoking totally, instead of slowly cutting back how much you smoke over a period of time.  Going to in-person counseling. You are more likely to quit if you go to many counseling sessions.  Using resources and support systems, such as: ? Online chats with a counselor. ? Phone quitlines. ? Printed self-help materials. ? Support groups or group counseling. ? Text messaging programs. ? Mobile phone apps or applications.  Taking medicines. Some of these medicines may have nicotine in them. If you are pregnant or breastfeeding, do not take any medicines to quit smoking unless your doctor says it is okay. Talk with your doctor about counseling or other things that can help you.  Talk with your doctor about using more than one strategy at the same time, such as taking medicines while you are also going to in-person counseling. This can help make  quitting easier. What things can I do to make it easier to quit? Quitting smoking might feel very hard at first, but there is a lot that you can do to make it easier. Take these steps:  Talk to your family and friends. Ask them to support and encourage you.  Call phone quitlines, reach out to support groups, or work with a counselor.  Ask people who smoke to not smoke around you.  Avoid places that make you want (trigger) to smoke, such as: ? Bars. ? Parties. ? Smoke-break areas at work.  Spend time with people who do not smoke.  Lower the stress in your life. Stress can make you want to smoke. Try these things to help your stress: ? Getting regular exercise. ? Deep-breathing exercises. ? Yoga. ? Meditating. ? Doing a body scan. To do this, close your eyes, focus on one area of your body at a time from head to toe, and notice which parts of your body are tense. Try to relax the muscles in those areas.  Download or buy apps on your mobile phone or tablet that can help you stick to your quit plan. There are many free apps, such as QuitGuide from the CDC (Centers for Disease Control and Prevention). You can find more support from smokefree.gov and other websites.  This information is not intended to replace advice given to you by your health care provider. Make sure you discuss any questions you have with your health care provider. Document Released: 08/11/2009 Document   Revised: 06/12/2016 Document Reviewed: 03/01/2015 Elsevier Interactive Patient Education  2018 [employer]  

## 2018-06-16 NOTE — Progress Notes (Signed)
[place] [phone]   [phone]  OFFICE PROGRESS NOTE  Patient, No Pcp Per No address on file  DIAGNOSIS: Extensive stage (T4, N2, M1a) small cell lung cancer presented with large right perihilar mass with mediastinal invasion and loculated malignant right pleural effusion diagnosed in June 2019.  PRIOR THERAPY:None.  CURRENT THERAPY: Systemic chemotherapy with carboplatin for AUC of 5 on day 1, etoposide 100 mg/M2 on days 1, 2 and 3 in addition to Tecentriq 1200 mg IV every 3 weeks with Neulasta support.  Status post 3 cycles.  INTERVAL HISTORY: Christopher Wilson 77 y.o. male returns to the clinic today for follow-up visit accompanied by his daughter.  The patient is feeling fine today with no specific complaints except for fatigue.  He denied having any chest pain, shortness of breath, cough or hemoptysis.  He denied having any fever or chills.  He has no nausea, vomiting, diarrhea or constipation.  He has no headache or visual changes.  He denied having any significant weight loss or night sweats.  He continues to tolerate his systemic chemotherapy with carboplatin, etoposide and Tecentriq fairly well.  He had repeat CT scan of the chest, abdomen and pelvis performed recently and he is here for evaluation and discussion of his discuss results.  MEDICAL HISTORY: Past Medical History:  Diagnosis Date  . ACS (acute coronary syndrome), with ST elevation but stable coronary arteries on cath 06/27/12 06/27/2012  . CAD (coronary artery disease), with CABG 1989 and re-do Cabg 2002 X 3 vesssels.  progressive disease with Stent to Lt. Main, and 1st diag 2006, stent to VG to LCX 2007, with known occlusion history o  06/27/2012  . CKD (chronic kidney disease) stage 3, GFR 30-59 ml/min (HCC) 06/27/2012  . 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 Coronary artery disease   . GERD (gastroesophageal reflux disease)   . Hyperlipidemia LDL goal < 70 06/27/2012  .  Hypertension   . Myocardial infarction ([place])   . Peptic ulcer   . STEMI (ST elevation myocardial infarction) ([place]) 06/27/2012  . Tobacco abuse 06/27/2012    ALLERGIES:  has No Known Allergies.  MEDICATIONS:  Current Outpatient Medications  Medication Sig Dispense Refill  . acetaminophen (TYLENOL) 325 MG tablet Take 650 mg by mouth every 6 (six) hours as needed for moderate pain.    Marland Kitchen albuterol (PROVENTIL HFA;VENTOLIN HFA) 108 (90 Base) MCG/ACT inhaler Inhale 2 puffs into the lungs every 6 (six) hours as needed for wheezing or shortness of breath.    Marland Kitchen atorvastatin (LIPITOR) 80 MG tablet TAKE ONE TABLET BY MOUTH DAILY AT 6 P.M. NEEDS APPOINTMENT FOR FUTURE REFILLS. 90 tablet 3  . furosemide (LASIX) 40 MG tablet Take 1 tablet by mouth daily for 3 days then taking only as needed. (Patient not taking: Reported on 05/10/2018) 10 tablet 0  . isosorbide mononitrate (IMDUR) 30 MG 24 hr tablet Take 1 tablet (30 mg total) by mouth daily. 90 tablet 3  . metoprolol tartrate (LOPRESSOR) 25 MG tablet TAKE ONE TABLET BY MOUTH TWICE DAILY (PLEASE  CONTACT  OFFICE  FOR  ADDITIONAL  REFILLS  ) (Patient taking differently: TAKE ONE TABLET BY MOUTH DAILY (PLEASE  CONTACT  OFFICE  FOR  ADDITIONAL  REFILLS  )) 180 tablet 2  . nitroGLYCERIN (NITROSTAT) 0.4 MG SL tablet Place 1 tablet (0.4 mg total) under the tongue every 5 (five) minutes as needed for chest pain. 25 tablet 3  . prochlorperazine (COMPAZINE) 10  MG tablet Take 1 tablet (10 mg total) by mouth every 6 (six) hours as needed for nausea or vomiting. (Patient not taking: Reported on 05/27/2018) 30 tablet 0  . rivaroxaban (XARELTO) 20 MG TABS tablet Take 1 tablet (20 mg total) by mouth daily with supper. 30 tablet 1  . Rivaroxaban 15 & 20 MG TBPK Take as directed on package: Take one 15mg  tablet by mouth twice a day with food. On Day 22, switch to one 20mg  tablet once a day with food. (Patient not taking: Reported on 06/12/2018) 51 each 0   No current  facility-administered medications for this visit.     SURGICAL HISTORY:  Past Surgical History:  Procedure Laterality Date  . coratid arterty surgery    . CORONARY ANGIOPLASTY WITH STENT PLACEMENT    . CORONARY ARTERY BYPASS GRAFT    . IR GUIDED DRAIN W CATHETER PLACEMENT  04/08/2018  . LCEA    . LEFT HEART CATH N/A 06/27/2012   Procedure: LEFT HEART CATH;  Surgeon: Troy Sine, MD;  Location: [facility] CATH LAB;  Service: Cardiovascular;  Laterality: N/A;  . triple bipass     Hx of CABG and RE do  . VASCULAR SURGERY    . VIDEO BRONCHOSCOPY Bilateral 04/07/2018   Procedure: VIDEO BRONCHOSCOPY WITHOUT FLUORO;  Surgeon: Rush Farmer, MD;  Location[facility] ENDOSCOPY;  Service: Cardiopulmonary;  Laterality: Bilateral;    REVIEW OF SYSTEMS:  Constitutional: positive for fatigue Eyes: negative Ears, nose, mouth, throat, and face: negative Respiratory: negative Cardiovascular: negative Gastrointestinal: negative Genitourinary:negative Integument/breast: negative Hematologic/lymphatic: negative Musculoskeletal:negative Neurological: negative Behavioral/Psych: negative Endocrine: negative Allergic/Immunologic: negative   PHYSICAL EXAMINATION: General appearance: alert, cooperative, fatigued and no distress Head: Normocephalic, without obvious abnormality, atraumatic Neck: no adenopathy, no JVD, supple, symmetrical, trachea midline and thyroid not enlarged, symmetric, no tenderness/mass/nodules Lymph nodes: Cervical, supraclavicular, and axillary nodes normal. Resp: clear to auscultation bilaterally Back: symmetric, no curvature. ROM normal. No CVA tenderness. Cardio: regular rate and rhythm, S1, S2 normal, no murmur, click, rub or gallop GI: soft, non-tender; bowel sounds normal; no masses,  no organomegaly Extremities: extremities normal, atraumatic, no cyanosis or edema Neurologic: Alert and oriented X 3, normal strength and tone. Normal symmetric reflexes. Normal coordination and  gait  ECOG PERFORMANCE STATUS: 1 - Symptomatic but completely ambulatory  Blood pressure (!) 152/87, pulse 85, temperature 98.1 F (36.7 C), temperature source Oral, resp. rate 16, height 5\' 10"  (1.778 m), weight 156 lb 9.6 oz (71 kg), SpO2 96 %.  LABORATORY DATA: Lab Results  Component Value Date   WBC 18.6 (H) 06/10/2018   HGB 8.3 (L) 06/10/2018   HCT 26.5 (L) 06/10/2018   MCV 95.7 06/10/2018   PLT 74 (L) 06/10/2018      Chemistry      Component Value Date/Time   NA 141 06/10/2018 1426   NA 140 03/21/2018 1048   K 4.5 06/10/2018 1426   CL 108 06/10/2018 1426   CO2 23 06/10/2018 1426   BUN 17 06/10/2018 1426   BUN 45 (H) 03/21/2018 1048   CREATININE 1.56 (H) 06/10/2018 1426   CREATININE 1.80 (H) 09/06/2014 1033      Component Value Date/Time   CALCIUM 9.0 06/10/2018 1426   ALKPHOS 129 (H) 06/10/2018 1426   AST 10 (L) 06/10/2018 1426   ALT 8 06/10/2018 1426   BILITOT 0.2 (L) 06/10/2018 1426       RADIOGRAPHIC STUDIES: Ct Chest W Contrast  Result Date: 06/13/2018 CLINICAL DATA:  Small cell  right lung cancer. Ongoing chemotherapy. Restaging. EXAM: CT CHEST, ABDOMEN, AND PELVIS WITH CONTRAST TECHNIQUE: Multidetector CT imaging of the chest, abdomen and pelvis was performed following the standard protocol during bolus administration of intravenous contrast. CONTRAST:  70mL OMNIPAQUE IOHEXOL 300 MG/ML  SOLN COMPARISON:  04/21/2018 PET-CT.  03/24/2018 chest CT. FINDINGS: CT CHEST FINDINGS Cardiovascular: Top-normal heart size. No significant pericardial effusion/thickening. Left main and 3 vessel coronary atherosclerosis status post CABG. Atherosclerotic nonaneurysmal thoracic aorta. Normal caliber pulmonary arteries. No central pulmonary emboli. Mediastinum/Nodes: No discrete thyroid nodules. Unremarkable esophagus. No axillary adenopathy. Infiltrative right hilar/mediastinal component of the right lung mass, located between the SVC, right pulmonary artery and left atrium and  measuring 4.8 x 2.6 cm (series 2/image 34) is substantially decreased from 8.8 x 5.6 cm on 03/24/2018 chest CT using similar measurement technique. Otherwise no mediastinal or left hilar adenopathy. Lungs/Pleura: No pneumothorax. Trace dependent right pleural effusion with indwelling right pleural catheter. No left pleural effusion. Moderate centrilobular and paraseptal emphysema with diffuse bronchial wall thickening. Right perihilar lung mass involving the right upper and right middle lobes measures 5.0 x 3.7 cm (series 2/image 32), significantly decreased from 9.2 x 8.5 cm using similar measurement technique. Irregular subpleural 1.5 cm apical right upper lobe nodular opacity (series 7/image 27) is stable. Thick parenchymal band in the dependent right lower lobe is compatible with scarring or atelectasis. No acute consolidative airspace disease or new significant pulmonary nodules. Musculoskeletal: No aggressive appearing focal osseous lesions. Mild thoracic spondylosis. CT ABDOMEN PELVIS FINDINGS Hepatobiliary: Normal liver size. There are several (at least 5) hypodense subcentimeter lesions scattered in the liver, too small to characterize. Comparison is limited to the 03/24/2018 CT by the absence of IV contrast on the prior scan. These lesions are not appreciably changed. No definite new liver lesions. Cholelithiasis. No biliary ductal dilatation. Pancreas: Normal, with no mass or duct dilation. Spleen: Normal size. No mass. Adrenals/Urinary Tract: Normal adrenals. Stable severe asymmetric right renal atrophy. Stable coarsely calcified 1.5 cm lower right renal cyst. Small simple scattered left renal cysts, largest 1.9 cm in the interpolar left kidney. Hypodense left renal cortical lesions are too small to characterize and require no follow-up. Compensatory hypertrophy of the left kidney. Nonobstructing stones throughout the left kidney, largest 8 mm in the upper left kidney. No left hydronephrosis. Normal  bladder. Stomach/Bowel: Normal non-distended stomach. Normal caliber small bowel with no small bowel wall thickening. Normal appendix. Right upper quadrant cecum. Marked sigmoid diverticulosis, with no large bowel wall thickening or significant pericolonic fat stranding. Vascular/Lymphatic: Atherosclerotic abdominal aorta with stable 4.1 cm infrarenal abdominal aortic aneurysm. Patent portal, splenic and renal veins. No pathologically enlarged lymph nodes in the abdomen or pelvis. Reproductive: Stable mildly enlarged prostate with coarse nonspecific internal prostatic calcifications. Other: No pneumoperitoneum, ascites or focal fluid collection. Musculoskeletal: No aggressive appearing focal osseous lesions. Moderate lumbar spondylosis. IMPRESSION: 1. Significant partial treatment response. Perihilar right lung mass involving the right upper and middle lobes is significantly decreased in size. Infiltrative right hilar/mediastinal component of this mass is significantly decreased in size. No new or progressive metastatic disease. 2. Apical right upper lobe nodular opacity is stable and more likely nodular pleuroparenchymal scarring. 3. Trace dependent right pleural effusion with indwelling right pleural catheter. 4. Infrarenal 4.1 cm abdominal Aortic Aneurysm (ICD10-I71.9). Recommend follow-up aortic ultrasound in 1 year. This recommendation follows ACR consensus guidelines: White Paper of the ACR Incidental Findings Committee II on Vascular Findings. J Am Coll Radiol 2013; 22:979-892. 5. Aortic Atherosclerosis (  ICD10-I70.0) and Emphysema (ICD10-J43.9). 6. Additional chronic findings include: Mild prostatomegaly. Marked sigmoid diverticulosis. Cholelithiasis. Nonobstructing left nephrolithiasis. Severe right renal atrophy. Electronically Signed   By: Ilona Sorrel M.D.   On: 06/13/2018 14:44   Ct Abdomen Pelvis W Contrast  Result Date: 06/13/2018 CLINICAL DATA:  Small cell right lung cancer. Ongoing chemotherapy.  Restaging. EXAM: CT CHEST, ABDOMEN, AND PELVIS WITH CONTRAST TECHNIQUE: Multidetector CT imaging of the chest, abdomen and pelvis was performed following the standard protocol during bolus administration of intravenous contrast. CONTRAST:  53mL OMNIPAQUE IOHEXOL 300 MG/ML  SOLN COMPARISON:  04/21/2018 PET-CT.  03/24/2018 chest CT. FINDINGS: CT CHEST FINDINGS Cardiovascular: Top-normal heart size. No significant pericardial effusion/thickening. Left main and 3 vessel coronary atherosclerosis status post CABG. Atherosclerotic nonaneurysmal thoracic aorta. Normal caliber pulmonary arteries. No central pulmonary emboli. Mediastinum/Nodes: No discrete thyroid nodules. Unremarkable esophagus. No axillary adenopathy. Infiltrative right hilar/mediastinal component of the right lung mass, located between the SVC, right pulmonary artery and left atrium and measuring 4.8 x 2.6 cm (series 2/image 34) is substantially decreased from 8.8 x 5.6 cm on 03/24/2018 chest CT using similar measurement technique. Otherwise no mediastinal or left hilar adenopathy. Lungs/Pleura: No pneumothorax. Trace dependent right pleural effusion with indwelling right pleural catheter. No left pleural effusion. Moderate centrilobular and paraseptal emphysema with diffuse bronchial wall thickening. Right perihilar lung mass involving the right upper and right middle lobes measures 5.0 x 3.7 cm (series 2/image 32), significantly decreased from 9.2 x 8.5 cm using similar measurement technique. Irregular subpleural 1.5 cm apical right upper lobe nodular opacity (series 7/image 27) is stable. Thick parenchymal band in the dependent right lower lobe is compatible with scarring or atelectasis. No acute consolidative airspace disease or new significant pulmonary nodules. Musculoskeletal: No aggressive appearing focal osseous lesions. Mild thoracic spondylosis. CT ABDOMEN PELVIS FINDINGS Hepatobiliary: Normal liver size. There are several (at least 5) hypodense  subcentimeter lesions scattered in the liver, too small to characterize. Comparison is limited to the 03/24/2018 CT by the absence of IV contrast on the prior scan. These lesions are not appreciably changed. No definite new liver lesions. Cholelithiasis. No biliary ductal dilatation. Pancreas: Normal, with no mass or duct dilation. Spleen: Normal size. No mass. Adrenals/Urinary Tract: Normal adrenals. Stable severe asymmetric right renal atrophy. Stable coarsely calcified 1.5 cm lower right renal cyst. Small simple scattered left renal cysts, largest 1.9 cm in the interpolar left kidney. Hypodense left renal cortical lesions are too small to characterize and require no follow-up. Compensatory hypertrophy of the left kidney. Nonobstructing stones throughout the left kidney, largest 8 mm in the upper left kidney. No left hydronephrosis. Normal bladder. Stomach/Bowel: Normal non-distended stomach. Normal caliber small bowel with no small bowel wall thickening. Normal appendix. Right upper quadrant cecum. Marked sigmoid diverticulosis, with no large bowel wall thickening or significant pericolonic fat stranding. Vascular/Lymphatic: Atherosclerotic abdominal aorta with stable 4.1 cm infrarenal abdominal aortic aneurysm. Patent portal, splenic and renal veins. No pathologically enlarged lymph nodes in the abdomen or pelvis. Reproductive: Stable mildly enlarged prostate with coarse nonspecific internal prostatic calcifications. Other: No pneumoperitoneum, ascites or focal fluid collection. Musculoskeletal: No aggressive appearing focal osseous lesions. Moderate lumbar spondylosis. IMPRESSION: 1. Significant partial treatment response. Perihilar right lung mass involving the right upper and middle lobes is significantly decreased in size. Infiltrative right hilar/mediastinal component of this mass is significantly decreased in size. No new or progressive metastatic disease. 2. Apical right upper lobe nodular opacity is  stable and more likely nodular pleuroparenchymal scarring.  3. Trace dependent right pleural effusion with indwelling right pleural catheter. 4. Infrarenal 4.1 cm abdominal Aortic Aneurysm (ICD10-I71.9). Recommend follow-up aortic ultrasound in 1 year. This recommendation follows ACR consensus guidelines: White Paper of the ACR Incidental Findings Committee II on Vascular Findings. J Am Coll Radiol 2013; 10:789-794. 5. Aortic Atherosclerosis (ICD10-I70.0) and Emphysema (ICD10-J43.9). 6. Additional chronic findings include: Mild prostatomegaly. Marked sigmoid diverticulosis. Cholelithiasis. Nonobstructing left nephrolithiasis. Severe right renal atrophy. Electronically Signed   By: Ilona Sorrel M.D.   On: 06/13/2018 14:44    ASSESSMENT AND PLAN: This is a very pleasant 77 years old white male recently diagnosed with extensive stage small cell lung cancer and currently undergoing systemic chemotherapy with carboplatin, etoposide and Tecentriq status post 3 cycles. He has been tolerating his treatment with chemotherapy fairly well. He had repeat CT scan of the chest, abdomen and pelvis performed recently.  I personally and independently reviewed the scan images and discussed the result and showed the images to the patient and his daughter.  His scan showed significant improvement of his disease. I recommended for the patient to proceed with cycle #4 of his chemotherapy tomorrow as scheduled. I will refer the patient to interventional radiology for consideration of removal of the right Pleurx catheter. The patient will come back for follow-up visit in 3 weeks for evaluation before starting cycle #5. He was advised to call immediately if he has any concerning symptoms in the interval. The patient voices understanding of current disease status and treatment options and is in agreement with the current care plan.  All questions were answered. The patient knows to call the clinic with any problems, questions or  concerns. We can certainly see the patient much sooner if necessary.  Disclaimer: This note was dictated with voice recognition software. Similar sounding words can inadvertently be transcribed and may not be corrected upon review.

## 2018-06-16 NOTE — Telephone Encounter (Signed)
Gave relative avs report and appointments for August thru October. [facility] IR will call re catheter removal.

## 2018-06-17 ENCOUNTER — Inpatient Hospital Stay: Payer: Medicare HMO

## 2018-06-17 ENCOUNTER — Ambulatory Visit: Payer: Medicare HMO | Admitting: Nurse Practitioner

## 2018-06-17 VITALS — BP 122/67 | HR 80 | Temp 98.1°F | Resp 16

## 2018-06-17 DIAGNOSIS — C3411 Malignant neoplasm of upper lobe, right bronchus or lung: Secondary | ICD-10-CM

## 2018-06-17 DIAGNOSIS — C342 Malignant neoplasm of middle lobe, bronchus or lung: Secondary | ICD-10-CM | POA: Diagnosis not present

## 2018-06-17 DIAGNOSIS — Z5111 Encounter for antineoplastic chemotherapy: Secondary | ICD-10-CM | POA: Diagnosis not present

## 2018-06-17 DIAGNOSIS — Z79899 Other long term (current) drug therapy: Secondary | ICD-10-CM | POA: Diagnosis not present

## 2018-06-17 DIAGNOSIS — Z5112 Encounter for antineoplastic immunotherapy: Secondary | ICD-10-CM | POA: Diagnosis not present

## 2018-06-17 MED ORDER — SODIUM CHLORIDE 0.9 % IV SOLN
1200.0000 mg | Freq: Once | INTRAVENOUS | Status: AC
  Administered 2018-06-17: 1200 mg via INTRAVENOUS
  Filled 2018-06-17: qty 20

## 2018-06-17 MED ORDER — PALONOSETRON HCL INJECTION 0.25 MG/5ML
0.2500 mg | Freq: Once | INTRAVENOUS | Status: AC
  Administered 2018-06-17: 0.25 mg via INTRAVENOUS

## 2018-06-17 MED ORDER — SODIUM CHLORIDE 0.9 % IV SOLN
Freq: Once | INTRAVENOUS | Status: AC
  Administered 2018-06-17: 14:00:00 via INTRAVENOUS
  Filled 2018-06-17: qty 5

## 2018-06-17 MED ORDER — PALONOSETRON HCL INJECTION 0.25 MG/5ML
INTRAVENOUS | Status: AC
  Filled 2018-06-17: qty 5

## 2018-06-17 MED ORDER — SODIUM CHLORIDE 0.9 % IV SOLN
Freq: Once | INTRAVENOUS | Status: AC
  Administered 2018-06-17: 13:00:00 via INTRAVENOUS
  Filled 2018-06-17: qty 250

## 2018-06-17 MED ORDER — SODIUM CHLORIDE 0.9 % IV SOLN
324.0000 mg | Freq: Once | INTRAVENOUS | Status: AC
  Administered 2018-06-17: 320 mg via INTRAVENOUS
  Filled 2018-06-17: qty 32

## 2018-06-17 MED ORDER — SODIUM CHLORIDE 0.9 % IV SOLN
100.0000 mg/m2 | Freq: Once | INTRAVENOUS | Status: AC
  Administered 2018-06-17: 190 mg via INTRAVENOUS
  Filled 2018-06-17: qty 9.5

## 2018-06-17 NOTE — Patient Instructions (Signed)
[facility] Discharge Instructions for Patients Receiving Chemotherapy  Today you received the following chemotherapy agents:  Tecentriq, Carboplatin, Etoposide  To help prevent nausea and vomiting after your treatment, we encourage you to take your nausea medication as prescribed.   If you develop nausea and vomiting that is not controlled by your nausea medication, call the clinic.   BELOW ARE SYMPTOMS THAT SHOULD BE REPORTED IMMEDIATELY:  *FEVER GREATER THAN 100.5 F  *CHILLS WITH OR WITHOUT FEVER  NAUSEA AND VOMITING THAT IS NOT CONTROLLED WITH YOUR NAUSEA MEDICATION  *UNUSUAL SHORTNESS OF BREATH  *UNUSUAL BRUISING OR BLEEDING  TENDERNESS IN MOUTH AND THROAT WITH OR WITHOUT PRESENCE OF ULCERS  *URINARY PROBLEMS  *BOWEL PROBLEMS  UNUSUAL RASH Items with * indicate a potential emergency and should be followed up as soon as possible.  Feel free to call the clinic should you have any questions or concerns. The clinic phone number is [phone].  Please show the [facility] at check-in to the Emergency Department and triage nurse.

## 2018-06-17 NOTE — Progress Notes (Unsigned)
06/17/19-per Dr Julien Nordmann it is okay to treat pt 06/17/18 with Tecentriq,Carboplatin and VP-16 and labs from 06/17/19.

## 2018-06-18 ENCOUNTER — Inpatient Hospital Stay: Payer: Medicare HMO

## 2018-06-18 ENCOUNTER — Other Ambulatory Visit: Payer: Self-pay | Admitting: Radiology

## 2018-06-18 VITALS — BP 133/78 | HR 80 | Temp 97.8°F | Resp 16

## 2018-06-18 DIAGNOSIS — Z5112 Encounter for antineoplastic immunotherapy: Secondary | ICD-10-CM | POA: Diagnosis not present

## 2018-06-18 DIAGNOSIS — C342 Malignant neoplasm of middle lobe, bronchus or lung: Secondary | ICD-10-CM | POA: Diagnosis not present

## 2018-06-18 DIAGNOSIS — Z79899 Other long term (current) drug therapy: Secondary | ICD-10-CM | POA: Diagnosis not present

## 2018-06-18 DIAGNOSIS — Z5111 Encounter for antineoplastic chemotherapy: Secondary | ICD-10-CM | POA: Diagnosis not present

## 2018-06-18 DIAGNOSIS — C3411 Malignant neoplasm of upper lobe, right bronchus or lung: Secondary | ICD-10-CM

## 2018-06-18 MED ORDER — SODIUM CHLORIDE 0.9 % IV SOLN
Freq: Once | INTRAVENOUS | Status: AC
  Administered 2018-06-18: 14:00:00 via INTRAVENOUS
  Filled 2018-06-18: qty 250

## 2018-06-18 MED ORDER — DEXAMETHASONE SODIUM PHOSPHATE 10 MG/ML IJ SOLN
INTRAMUSCULAR | Status: AC
  Filled 2018-06-18: qty 1

## 2018-06-18 MED ORDER — SODIUM CHLORIDE 0.9 % IV SOLN
100.0000 mg/m2 | Freq: Once | INTRAVENOUS | Status: AC
  Administered 2018-06-18: 190 mg via INTRAVENOUS
  Filled 2018-06-18: qty 9.5

## 2018-06-18 MED ORDER — DEXAMETHASONE SODIUM PHOSPHATE 10 MG/ML IJ SOLN
10.0000 mg | Freq: Once | INTRAMUSCULAR | Status: AC
  Administered 2018-06-18: 10 mg via INTRAVENOUS

## 2018-06-19 ENCOUNTER — Ambulatory Visit (HOSPITAL_COMMUNITY)
Admission: RE | Admit: 2018-06-19 | Discharge: 2018-06-19 | Disposition: A | Discharge status: Home / Self Care | Payer: Medicare HMO | Source: Ambulatory Visit | Attending: Diagnostic Radiology | Admitting: Diagnostic Radiology

## 2018-06-19 ENCOUNTER — Ambulatory Visit (HOSPITAL_COMMUNITY)
Admission: RE | Admit: 2018-06-19 | Discharge: 2018-06-19 | Disposition: A | Discharge status: Home / Self Care | Payer: Medicare HMO | Source: Ambulatory Visit | Attending: Internal Medicine | Admitting: Internal Medicine

## 2018-06-19 ENCOUNTER — Other Ambulatory Visit (HOSPITAL_COMMUNITY): Payer: Self-pay | Admitting: Diagnostic Radiology

## 2018-06-19 ENCOUNTER — Encounter (HOSPITAL_COMMUNITY): Payer: Self-pay | Admitting: Diagnostic Radiology

## 2018-06-19 ENCOUNTER — Inpatient Hospital Stay: Payer: Medicare HMO

## 2018-06-19 VITALS — BP 157/99 | HR 65 | Temp 97.9°F | Resp 18

## 2018-06-19 DIAGNOSIS — I517 Cardiomegaly: Secondary | ICD-10-CM | POA: Insufficient documentation

## 2018-06-19 DIAGNOSIS — J90 Pleural effusion, not elsewhere classified: Secondary | ICD-10-CM

## 2018-06-19 DIAGNOSIS — Z951 Presence of aortocoronary bypass graft: Secondary | ICD-10-CM | POA: Diagnosis not present

## 2018-06-19 DIAGNOSIS — Z79899 Other long term (current) drug therapy: Secondary | ICD-10-CM | POA: Diagnosis not present

## 2018-06-19 DIAGNOSIS — C3411 Malignant neoplasm of upper lobe, right bronchus or lung: Secondary | ICD-10-CM

## 2018-06-19 DIAGNOSIS — Z85118 Personal history of other malignant neoplasm of bronchus and lung: Secondary | ICD-10-CM | POA: Diagnosis not present

## 2018-06-19 DIAGNOSIS — C342 Malignant neoplasm of middle lobe, bronchus or lung: Secondary | ICD-10-CM | POA: Diagnosis not present

## 2018-06-19 DIAGNOSIS — Z4682 Encounter for fitting and adjustment of non-vascular catheter: Secondary | ICD-10-CM | POA: Insufficient documentation

## 2018-06-19 DIAGNOSIS — Z5112 Encounter for antineoplastic immunotherapy: Secondary | ICD-10-CM | POA: Diagnosis not present

## 2018-06-19 DIAGNOSIS — T85698A Other mechanical complication of other specified internal prosthetic devices, implants and grafts, initial encounter: Secondary | ICD-10-CM | POA: Diagnosis not present

## 2018-06-19 DIAGNOSIS — Z5111 Encounter for antineoplastic chemotherapy: Secondary | ICD-10-CM | POA: Diagnosis not present

## 2018-06-19 HISTORY — PX: IR REMOVAL OF PLURAL CATH W/CUFF: IMG5346

## 2018-06-19 MED ORDER — LIDOCAINE HCL (PF) 1 % IJ SOLN
INTRAMUSCULAR | Status: DC | PRN
  Administered 2018-06-19: 5 mL

## 2018-06-19 MED ORDER — DEXAMETHASONE SODIUM PHOSPHATE 10 MG/ML IJ SOLN
INTRAMUSCULAR | Status: AC
  Filled 2018-06-19: qty 1

## 2018-06-19 MED ORDER — LIDOCAINE HCL 1 % IJ SOLN
INTRAMUSCULAR | Status: AC
  Filled 2018-06-19: qty 20

## 2018-06-19 MED ORDER — CHLORHEXIDINE GLUCONATE 4 % EX LIQD
CUTANEOUS | Status: AC
  Filled 2018-06-19: qty 15

## 2018-06-19 MED ORDER — DEXAMETHASONE SODIUM PHOSPHATE 10 MG/ML IJ SOLN
10.0000 mg | Freq: Once | INTRAMUSCULAR | Status: AC
  Administered 2018-06-19: 10 mg via INTRAVENOUS

## 2018-06-19 MED ORDER — SODIUM CHLORIDE 0.9 % IV SOLN
Freq: Once | INTRAVENOUS | Status: AC
  Administered 2018-06-19: 10:00:00 via INTRAVENOUS
  Filled 2018-06-19: qty 250

## 2018-06-19 MED ORDER — SODIUM CHLORIDE 0.9 % IV SOLN
100.0000 mg/m2 | Freq: Once | INTRAVENOUS | Status: AC
  Administered 2018-06-19: 190 mg via INTRAVENOUS
  Filled 2018-06-19: qty 9.5

## 2018-06-19 NOTE — Patient Instructions (Signed)
[name] Discharge Instructions for Patients Receiving Chemotherapy  Today you received the following chemotherapy agents :  Etoposide.  To help prevent nausea and vomiting after your treatment, we encourage you to take your nausea medication as prescribed.   If you develop nausea and vomiting that is not controlled by your nausea medication, call the clinic.   BELOW ARE SYMPTOMS THAT SHOULD BE REPORTED IMMEDIATELY:  *FEVER GREATER THAN 100.5 F  *CHILLS WITH OR WITHOUT FEVER  NAUSEA AND VOMITING THAT IS NOT CONTROLLED WITH YOUR NAUSEA MEDICATION  *UNUSUAL SHORTNESS OF BREATH  *UNUSUAL BRUISING OR BLEEDING  TENDERNESS IN MOUTH AND THROAT WITH OR WITHOUT PRESENCE OF ULCERS  *URINARY PROBLEMS  *BOWEL PROBLEMS  UNUSUAL RASH Items with * indicate a potential emergency and should be followed up as soon as possible.  Feel free to call the clinic should you have any questions or concerns. The clinic phone number is [phone].  Please show the [facility] at check-in to the Emergency Department and triage nurse.

## 2018-06-19 NOTE — Procedures (Addendum)
Successful removal of right tunneled Pleur-x.   EBL: None No immediate complications. CXR ordered post procedure.  Candiss Norse, PA-C

## 2018-06-20 ENCOUNTER — Inpatient Hospital Stay: Payer: Medicare HMO

## 2018-06-20 VITALS — BP 138/86 | HR 73 | Temp 97.6°F | Resp 18

## 2018-06-20 DIAGNOSIS — Z79899 Other long term (current) drug therapy: Secondary | ICD-10-CM | POA: Diagnosis not present

## 2018-06-20 DIAGNOSIS — C342 Malignant neoplasm of middle lobe, bronchus or lung: Secondary | ICD-10-CM | POA: Diagnosis not present

## 2018-06-20 DIAGNOSIS — Z5111 Encounter for antineoplastic chemotherapy: Secondary | ICD-10-CM | POA: Diagnosis not present

## 2018-06-20 DIAGNOSIS — C3411 Malignant neoplasm of upper lobe, right bronchus or lung: Secondary | ICD-10-CM

## 2018-06-20 DIAGNOSIS — Z5112 Encounter for antineoplastic immunotherapy: Secondary | ICD-10-CM | POA: Diagnosis not present

## 2018-06-20 MED ORDER — PEGFILGRASTIM-CBQV 6 MG/0.6ML ~~LOC~~ SOSY
6.0000 mg | PREFILLED_SYRINGE | Freq: Once | SUBCUTANEOUS | Status: AC
  Administered 2018-06-20: 6 mg via SUBCUTANEOUS

## 2018-06-20 MED ORDER — PEGFILGRASTIM-CBQV 6 MG/0.6ML ~~LOC~~ SOSY
PREFILLED_SYRINGE | SUBCUTANEOUS | Status: AC
  Filled 2018-06-20: qty 0.6

## 2018-06-21 ENCOUNTER — Ambulatory Visit: Payer: Medicare HMO

## 2018-06-23 ENCOUNTER — Ambulatory Visit (HOSPITAL_COMMUNITY): Payer: Medicare HMO | Attending: Physician Assistant

## 2018-06-24 ENCOUNTER — Inpatient Hospital Stay: Payer: Medicare HMO

## 2018-06-24 DIAGNOSIS — C342 Malignant neoplasm of middle lobe, bronchus or lung: Secondary | ICD-10-CM | POA: Diagnosis not present

## 2018-06-24 DIAGNOSIS — Z79899 Other long term (current) drug therapy: Secondary | ICD-10-CM | POA: Diagnosis not present

## 2018-06-24 DIAGNOSIS — C3411 Malignant neoplasm of upper lobe, right bronchus or lung: Secondary | ICD-10-CM

## 2018-06-24 DIAGNOSIS — Z5111 Encounter for antineoplastic chemotherapy: Secondary | ICD-10-CM | POA: Diagnosis not present

## 2018-06-24 DIAGNOSIS — Z5112 Encounter for antineoplastic immunotherapy: Secondary | ICD-10-CM | POA: Diagnosis not present

## 2018-06-24 LAB — CMP (CANCER CENTER ONLY)
ALK PHOS: 125 U/L (ref 38–126)
ALT: 6 U/L (ref 0–44)
AST: 10 U/L — ABNORMAL LOW (ref 15–41)
Albumin: 3.1 g/dL — ABNORMAL LOW (ref 3.5–5.0)
Anion gap: 4 — ABNORMAL LOW (ref 5–15)
BUN: 34 mg/dL — ABNORMAL HIGH (ref 8–23)
CALCIUM: 9.3 mg/dL (ref 8.9–10.3)
CHLORIDE: 108 mmol/L (ref 98–111)
CO2: 26 mmol/L (ref 22–32)
CREATININE: 1.51 mg/dL — AB (ref 0.61–1.24)
GFR, EST AFRICAN AMERICAN: 50 mL/min — AB (ref >60)
GFR, EST NON AFRICAN AMERICAN: 43 mL/min — AB (ref >60)
Glucose, Bld: 101 mg/dL — ABNORMAL HIGH (ref 70–99)
Potassium: 5.6 mmol/L — ABNORMAL HIGH (ref 3.5–5.1)
Sodium: 138 mmol/L (ref 135–145)
TOTAL PROTEIN: 6.7 g/dL (ref 6.5–8.1)
Total Bilirubin: 0.5 mg/dL (ref 0.3–1.2)

## 2018-06-24 LAB — CBC WITH DIFFERENTIAL (CANCER CENTER ONLY)
Basophils Absolute: 0 10*3/uL (ref 0.0–0.1)
Basophils Relative: 0 %
EOS PCT: 0 %
Eosinophils Absolute: 0 10*3/uL (ref 0.0–0.5)
HCT: 26.2 % — ABNORMAL LOW (ref 38.4–49.9)
Hemoglobin: 8.1 g/dL — ABNORMAL LOW (ref 13.0–17.1)
LYMPHS ABS: 0.7 10*3/uL — AB (ref 0.9–3.3)
LYMPHS PCT: 9 %
MCH: 29.8 pg (ref 27.2–33.4)
MCHC: 30.9 g/dL — ABNORMAL LOW (ref 32.0–36.0)
MCV: 96.3 fL (ref 79.3–98.0)
Monocytes Absolute: 0.1 10*3/uL (ref 0.1–0.9)
Monocytes Relative: 1 %
Neutro Abs: 7.1 10*3/uL — ABNORMAL HIGH (ref 1.5–6.5)
Neutrophils Relative %: 90 %
PLATELETS: 232 10*3/uL (ref 140–400)
RBC: 2.72 MIL/uL — AB (ref 4.20–5.82)
RDW: 19.4 % — ABNORMAL HIGH (ref 11.0–14.6)
WBC: 8 10*3/uL (ref 4.0–10.3)

## 2018-07-01 ENCOUNTER — Inpatient Hospital Stay: Payer: Medicare HMO | Attending: Internal Medicine

## 2018-07-01 DIAGNOSIS — Z5111 Encounter for antineoplastic chemotherapy: Secondary | ICD-10-CM | POA: Diagnosis not present

## 2018-07-01 DIAGNOSIS — Z5189 Encounter for other specified aftercare: Secondary | ICD-10-CM | POA: Diagnosis not present

## 2018-07-01 DIAGNOSIS — Z79899 Other long term (current) drug therapy: Secondary | ICD-10-CM | POA: Diagnosis not present

## 2018-07-01 DIAGNOSIS — L03114 Cellulitis of left upper limb: Secondary | ICD-10-CM | POA: Diagnosis not present

## 2018-07-01 DIAGNOSIS — C3411 Malignant neoplasm of upper lobe, right bronchus or lung: Secondary | ICD-10-CM

## 2018-07-01 DIAGNOSIS — Z5112 Encounter for antineoplastic immunotherapy: Secondary | ICD-10-CM | POA: Insufficient documentation

## 2018-07-01 DIAGNOSIS — C342 Malignant neoplasm of middle lobe, bronchus or lung: Secondary | ICD-10-CM | POA: Insufficient documentation

## 2018-07-01 LAB — CBC WITH DIFFERENTIAL (CANCER CENTER ONLY)
Basophils Absolute: 0.1 10*3/uL (ref 0.0–0.1)
Basophils Relative: 1 %
Eosinophils Absolute: 0.1 10*3/uL (ref 0.0–0.5)
Eosinophils Relative: 0 %
HEMATOCRIT: 23 % — AB (ref 38.4–49.9)
HEMOGLOBIN: 7.5 g/dL — AB (ref 13.0–17.1)
LYMPHS ABS: 1.1 10*3/uL (ref 0.9–3.3)
LYMPHS PCT: 6 %
MCH: 30.4 pg (ref 27.2–33.4)
MCHC: 32.4 g/dL (ref 32.0–36.0)
MCV: 94 fL (ref 79.3–98.0)
MONOS PCT: 8 %
Monocytes Absolute: 1.3 10*3/uL — ABNORMAL HIGH (ref 0.1–0.9)
NEUTROS PCT: 85 %
Neutro Abs: 14.9 10*3/uL — ABNORMAL HIGH (ref 1.5–6.5)
Platelet Count: 50 10*3/uL — ABNORMAL LOW (ref 140–400)
RBC: 2.45 MIL/uL — AB (ref 4.20–5.82)
RDW: 21.1 % — ABNORMAL HIGH (ref 11.0–14.6)
WBC: 17.5 10*3/uL — AB (ref 4.0–10.3)

## 2018-07-01 LAB — CMP (CANCER CENTER ONLY)
ALT: 7 U/L (ref 0–44)
AST: 10 U/L — AB (ref 15–41)
Albumin: 3.1 g/dL — ABNORMAL LOW (ref 3.5–5.0)
Alkaline Phosphatase: 124 U/L (ref 38–126)
Anion gap: 9 (ref 5–15)
BUN: 23 mg/dL (ref 8–23)
CO2: 25 mmol/L (ref 22–32)
Calcium: 9.5 mg/dL (ref 8.9–10.3)
Chloride: 108 mmol/L (ref 98–111)
Creatinine: 1.84 mg/dL — ABNORMAL HIGH (ref 0.61–1.24)
GFR, EST AFRICAN AMERICAN: 39 mL/min — AB (ref >60)
GFR, EST NON AFRICAN AMERICAN: 34 mL/min — AB (ref >60)
Glucose, Bld: 95 mg/dL (ref 70–99)
POTASSIUM: 4.9 mmol/L (ref 3.5–5.1)
SODIUM: 142 mmol/L (ref 135–145)
Total Bilirubin: 0.2 mg/dL — ABNORMAL LOW (ref 0.3–1.2)
Total Protein: 6.6 g/dL (ref 6.5–8.1)

## 2018-07-02 ENCOUNTER — Other Ambulatory Visit: Payer: Self-pay | Admitting: Medical Oncology

## 2018-07-02 ENCOUNTER — Ambulatory Visit: Payer: Medicare HMO | Admitting: Physician Assistant

## 2018-07-02 DIAGNOSIS — D649 Anemia, unspecified: Secondary | ICD-10-CM

## 2018-07-04 ENCOUNTER — Other Ambulatory Visit: Payer: Medicare HMO

## 2018-07-04 ENCOUNTER — Inpatient Hospital Stay: Payer: Medicare HMO

## 2018-07-04 ENCOUNTER — Telehealth: Payer: Self-pay | Admitting: Medical Oncology

## 2018-07-04 DIAGNOSIS — L03114 Cellulitis of left upper limb: Secondary | ICD-10-CM | POA: Diagnosis not present

## 2018-07-04 DIAGNOSIS — D649 Anemia, unspecified: Secondary | ICD-10-CM

## 2018-07-04 DIAGNOSIS — Z5189 Encounter for other specified aftercare: Secondary | ICD-10-CM | POA: Diagnosis not present

## 2018-07-04 DIAGNOSIS — Z79899 Other long term (current) drug therapy: Secondary | ICD-10-CM | POA: Diagnosis not present

## 2018-07-04 DIAGNOSIS — Z5112 Encounter for antineoplastic immunotherapy: Secondary | ICD-10-CM | POA: Diagnosis not present

## 2018-07-04 DIAGNOSIS — C342 Malignant neoplasm of middle lobe, bronchus or lung: Secondary | ICD-10-CM | POA: Diagnosis not present

## 2018-07-04 DIAGNOSIS — Z5111 Encounter for antineoplastic chemotherapy: Secondary | ICD-10-CM | POA: Diagnosis not present

## 2018-07-04 LAB — PREPARE RBC (CROSSMATCH)

## 2018-07-04 NOTE — Telephone Encounter (Signed)
Pt did not show up for labs, I called him and could not understand pt . Dtr will bring pt today for labs and tomorrow for blood transfusion.

## 2018-07-05 ENCOUNTER — Ambulatory Visit: Payer: Medicare HMO

## 2018-07-05 ENCOUNTER — Telehealth: Payer: Self-pay | Admitting: Hematology

## 2018-07-05 DIAGNOSIS — D649 Anemia, unspecified: Secondary | ICD-10-CM

## 2018-07-05 DIAGNOSIS — Z5111 Encounter for antineoplastic chemotherapy: Secondary | ICD-10-CM | POA: Diagnosis not present

## 2018-07-05 DIAGNOSIS — Z79899 Other long term (current) drug therapy: Secondary | ICD-10-CM | POA: Diagnosis not present

## 2018-07-05 DIAGNOSIS — Z5112 Encounter for antineoplastic immunotherapy: Secondary | ICD-10-CM | POA: Diagnosis not present

## 2018-07-05 DIAGNOSIS — C342 Malignant neoplasm of middle lobe, bronchus or lung: Secondary | ICD-10-CM | POA: Diagnosis not present

## 2018-07-05 DIAGNOSIS — Z5189 Encounter for other specified aftercare: Secondary | ICD-10-CM | POA: Diagnosis not present

## 2018-07-05 DIAGNOSIS — L03114 Cellulitis of left upper limb: Secondary | ICD-10-CM | POA: Diagnosis not present

## 2018-07-05 LAB — ABO/RH: ABO/RH(D): A POS

## 2018-07-05 MED ORDER — ACETAMINOPHEN 325 MG PO TABS
ORAL_TABLET | ORAL | Status: AC
  Filled 2018-07-05: qty 2

## 2018-07-05 MED ORDER — DIPHENHYDRAMINE HCL 25 MG PO CAPS
ORAL_CAPSULE | ORAL | Status: AC
  Filled 2018-07-05: qty 1

## 2018-07-05 MED ORDER — SODIUM CHLORIDE 0.9% IV SOLUTION
250.0000 mL | Freq: Once | INTRAVENOUS | Status: AC
  Administered 2018-07-05: 250 mL via INTRAVENOUS
  Filled 2018-07-05: qty 250

## 2018-07-05 MED ORDER — DOXYCYCLINE HYCLATE 100 MG PO TABS: 100.0000 mg | ORAL_TABLET | Freq: Two times a day (BID) | ORAL | 0 refills | Status: DC

## 2018-07-05 MED ORDER — SODIUM CHLORIDE 0.9 % IV SOLN
750.0000 mL | Freq: Once | INTRAVENOUS | Status: AC
  Administered 2018-07-05: 250 mL via INTRAVENOUS
  Filled 2018-07-05: qty 750

## 2018-07-05 MED ORDER — DIPHENHYDRAMINE HCL 25 MG PO CAPS
25.0000 mg | ORAL_CAPSULE | Freq: Once | ORAL | Status: AC
  Administered 2018-07-05: 25 mg via ORAL

## 2018-07-05 MED ORDER — ACETAMINOPHEN 325 MG PO TABS
650.0000 mg | ORAL_TABLET | Freq: Once | ORAL | Status: AC
  Administered 2018-07-05: 650 mg via ORAL

## 2018-07-05 NOTE — Patient Instructions (Signed)

## 2018-07-05 NOTE — Telephone Encounter (Signed)
Patient came in for blood transfusion today.  I was called by his infusion nurse to evaluate his left forearm edema.  Patient reports a few skin rash in the area started several days ago, and he noticed left forearm swelling this morning, no significant pain, no fever.  On exam, he has several dime size skin rash in the forearm, with mild diffuse skin erythema and edema in the medial forearm, mild warmness, no edema in the hand, upper arm or lateral side of forearm.  I think this is likely mild cellulitis, not sure if caused by bug bite, less likely thrombosis.  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  I will call in doxycycline 100 mg twice daily to his pharmacy today, he has appointment and chemo infusion next Tuesday, I will inform his primary oncologist Dr. Julien Nordmann.  Truitt Merle  07/05/2018

## 2018-07-06 LAB — TYPE AND SCREEN
ABO/RH(D): A POS
Antibody Screen: NEGATIVE
UNIT DIVISION: 0
UNIT DIVISION: 0

## 2018-07-06 LAB — BPAM RBC
Blood Product Expiration Date: 201909242359
Blood Product Expiration Date: 201909282359
ISSUE DATE / TIME: 201909070916
ISSUE DATE / TIME: 201909070916
UNIT TYPE AND RH: 6200
Unit Type and Rh: 6200

## 2018-07-08 ENCOUNTER — Inpatient Hospital Stay: Payer: Medicare HMO

## 2018-07-08 ENCOUNTER — Telehealth: Payer: Self-pay | Admitting: Internal Medicine

## 2018-07-08 ENCOUNTER — Inpatient Hospital Stay (HOSPITAL_BASED_OUTPATIENT_CLINIC_OR_DEPARTMENT_OTHER): Payer: Medicare HMO | Admitting: Internal Medicine

## 2018-07-08 ENCOUNTER — Encounter: Payer: Self-pay | Admitting: Internal Medicine

## 2018-07-08 VITALS — BP 152/96 | HR 88 | Temp 97.7°F | Resp 20 | Ht 70.0 in | Wt 159.8 lb

## 2018-07-08 DIAGNOSIS — Z5189 Encounter for other specified aftercare: Secondary | ICD-10-CM | POA: Diagnosis not present

## 2018-07-08 DIAGNOSIS — C3411 Malignant neoplasm of upper lobe, right bronchus or lung: Secondary | ICD-10-CM

## 2018-07-08 DIAGNOSIS — C342 Malignant neoplasm of middle lobe, bronchus or lung: Secondary | ICD-10-CM | POA: Diagnosis not present

## 2018-07-08 DIAGNOSIS — Z5111 Encounter for antineoplastic chemotherapy: Secondary | ICD-10-CM

## 2018-07-08 DIAGNOSIS — I10 Essential (primary) hypertension: Secondary | ICD-10-CM

## 2018-07-08 DIAGNOSIS — R530 Neoplastic (malignant) related fatigue: Secondary | ICD-10-CM

## 2018-07-08 DIAGNOSIS — L03114 Cellulitis of left upper limb: Secondary | ICD-10-CM | POA: Diagnosis not present

## 2018-07-08 DIAGNOSIS — R7989 Other specified abnormal findings of blood chemistry: Secondary | ICD-10-CM

## 2018-07-08 DIAGNOSIS — R5382 Chronic fatigue, unspecified: Secondary | ICD-10-CM

## 2018-07-08 DIAGNOSIS — Z5112 Encounter for antineoplastic immunotherapy: Secondary | ICD-10-CM | POA: Diagnosis not present

## 2018-07-08 DIAGNOSIS — Z79899 Other long term (current) drug therapy: Secondary | ICD-10-CM | POA: Diagnosis not present

## 2018-07-08 LAB — CMP (CANCER CENTER ONLY)
ALK PHOS: 111 U/L (ref 38–126)
AST: 9 U/L — ABNORMAL LOW (ref 15–41)
Albumin: 3 g/dL — ABNORMAL LOW (ref 3.5–5.0)
Anion gap: 9 (ref 5–15)
BUN: 23 mg/dL (ref 8–23)
CALCIUM: 9.7 mg/dL (ref 8.9–10.3)
CO2: 23 mmol/L (ref 22–32)
CREATININE: 1.84 mg/dL — AB (ref 0.61–1.24)
Chloride: 108 mmol/L (ref 98–111)
GFR, EST AFRICAN AMERICAN: 39 mL/min — AB (ref >60)
GFR, Estimated: 34 mL/min — ABNORMAL LOW (ref >60)
GLUCOSE: 91 mg/dL (ref 70–99)
Potassium: 4.6 mmol/L (ref 3.5–5.1)
SODIUM: 140 mmol/L (ref 135–145)
Total Bilirubin: 0.6 mg/dL (ref 0.3–1.2)
Total Protein: 7.1 g/dL (ref 6.5–8.1)

## 2018-07-08 LAB — CBC WITH DIFFERENTIAL (CANCER CENTER ONLY)
Basophils Absolute: 0 10*3/uL (ref 0.0–0.1)
Basophils Relative: 0 %
EOS PCT: 0 %
Eosinophils Absolute: 0 10*3/uL (ref 0.0–0.5)
HEMATOCRIT: 31.6 % — AB (ref 38.4–49.9)
HEMOGLOBIN: 10.1 g/dL — AB (ref 13.0–17.1)
LYMPHS ABS: 1.1 10*3/uL (ref 0.9–3.3)
Lymphocytes Relative: 8 %
MCH: 31 pg (ref 27.2–33.4)
MCHC: 32 g/dL (ref 32.0–36.0)
MCV: 96.9 fL (ref 79.3–98.0)
Monocytes Absolute: 1.2 10*3/uL — ABNORMAL HIGH (ref 0.1–0.9)
Monocytes Relative: 9 %
NEUTROS ABS: 10.2 10*3/uL — AB (ref 1.5–6.5)
NEUTROS PCT: 83 %
PLATELETS: 226 10*3/uL (ref 140–400)
RBC: 3.26 MIL/uL — AB (ref 4.20–5.82)
RDW: 19 % — ABNORMAL HIGH (ref 11.0–14.6)
WBC Count: 12.4 10*3/uL — ABNORMAL HIGH (ref 4.0–10.3)

## 2018-07-08 LAB — TSH: TSH: 0.708 u[IU]/mL (ref 0.320–4.118)

## 2018-07-08 MED ORDER — SODIUM CHLORIDE 0.9 % IV SOLN
Freq: Once | INTRAVENOUS | Status: AC
  Administered 2018-07-08: 10:00:00 via INTRAVENOUS
  Filled 2018-07-08: qty 250

## 2018-07-08 MED ORDER — SODIUM CHLORIDE 0.9 % IV SOLN
294.0000 mg | Freq: Once | INTRAVENOUS | Status: AC
  Administered 2018-07-08: 290 mg via INTRAVENOUS
  Filled 2018-07-08: qty 29

## 2018-07-08 MED ORDER — SODIUM CHLORIDE 0.9 % IV SOLN
1200.0000 mg | Freq: Once | INTRAVENOUS | Status: AC
  Administered 2018-07-08: 1200 mg via INTRAVENOUS
  Filled 2018-07-08: qty 20

## 2018-07-08 MED ORDER — PALONOSETRON HCL INJECTION 0.25 MG/5ML
INTRAVENOUS | Status: AC
  Filled 2018-07-08: qty 5

## 2018-07-08 MED ORDER — PALONOSETRON HCL INJECTION 0.25 MG/5ML
0.2500 mg | Freq: Once | INTRAVENOUS | Status: AC
  Administered 2018-07-08: 0.25 mg via INTRAVENOUS

## 2018-07-08 MED ORDER — SODIUM CHLORIDE 0.9 % IV SOLN
Freq: Once | INTRAVENOUS | Status: AC
  Administered 2018-07-08: 11:00:00 via INTRAVENOUS
  Filled 2018-07-08: qty 5

## 2018-07-08 MED ORDER — SODIUM CHLORIDE 0.9 % IV SOLN
100.0000 mg/m2 | Freq: Once | INTRAVENOUS | Status: AC
  Administered 2018-07-08: 190 mg via INTRAVENOUS
  Filled 2018-07-08: qty 9.5

## 2018-07-08 NOTE — Progress Notes (Signed)
Nutrition Follow-up:  Patient with lung cancer.  Patient followed by Dr. Burr Medico currently receiving chemotherapy.    Met with patient during infusion today.  Patient eating tomato soup and potato chips during visit.  Reports appetite is good.  Denies any nutrition impact symptoms at this time.  Reports that he is still living with daughter and she prepares meals for him.  Mostly eats 3 meals per day sometimes 2.    Medications: reviewed  Labs: reviewed  Anthropometrics:   Weight increased to 159 lb 12.8 oz from 156 lb 9.6 oz on 8/19, 154 lb on 7/30.     NUTRITION DIAGNOSIS: Inadequate oral intake improving   MALNUTRITION DIAGNOSIS: severe malnutrition improving   INTERVENTION:  Encouraged patient to continue to eat high calorie, high protein foods.  Reviewed with patient.       MONITORING, EVALUATION, GOAL: weight trends, po intake   NEXT VISIT: October 1 during infusion  Yari Szeliga B. Zenia Resides, [place], [place] Registered Dietitian [phone] (pager)

## 2018-07-08 NOTE — Progress Notes (Signed)
Okay for treatment today with creatinine level of 1.84 per Dr. Julien Nordmann.

## 2018-07-08 NOTE — Progress Notes (Signed)
Christopher Wilson [phone]   [phone]  OFFICE PROGRESS NOTE  Patient, No Pcp Per No address on file  DIAGNOSIS: Extensive stage (T4, N2, M1a) small cell lung cancer presented with large right perihilar mass with mediastinal invasion and loculated malignant right pleural effusion diagnosed in June 2019.  PRIOR THERAPY:None.  CURRENT THERAPY: Systemic chemotherapy with carboplatin for AUC of 5 on day 1, etoposide 100 mg/M2 on days 1, 2 and 3 in addition to Tecentriq 1200 mg IV every 3 weeks with Neulasta support.  Status post 4 cycles.  INTERVAL HISTORY: Christopher Wilson 77 y.o. male returns to the clinic today for follow-up visit.  The patient is feeling fine today with no concerning complaints except for swelling of the left forearm after blood transfusion few days ago.  He was treated for questionable cellulitis with doxycycline.  He denied having any current chest pain, shortness breath, cough or hemoptysis.  He denied having any fever or chills.  He has no nausea, vomiting, diarrhea or constipation.  He continues to tolerate his treatment with chemotherapy fairly well.  He is here for evaluation before starting cycle #5.  MEDICAL HISTORY: Past Medical History:  Diagnosis Date  . ACS (acute coronary syndrome), with ST elevation but stable coronary arteries on cath 06/27/12 06/27/2012  . CAD (coronary artery disease), with CABG 1989 and re-do Cabg 2002 X 3 vesssels.  progressive disease with Stent to Lt. Main, and 1st diag 2006, stent to VG to LCX 2007, with known occlusion history o  06/27/2012  . CKD (chronic kidney disease) stage 3, GFR 30-59 ml/min (HCC) 06/27/2012  . 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 Coronary artery disease   . GERD (gastroesophageal reflux disease)   . Hyperlipidemia LDL goal < 70 06/27/2012  . Hypertension   . Myocardial infarction ([place])   . Peptic ulcer   . STEMI (ST elevation myocardial infarction) ([place]) 06/27/2012  .  Tobacco abuse 06/27/2012    ALLERGIES:  has No Known Allergies.  MEDICATIONS:  Current Outpatient Medications  Medication Sig Dispense Refill  . acetaminophen (TYLENOL) 325 MG tablet Take 650 mg by mouth every 6 (six) hours as needed for moderate pain.    Marland Kitchen albuterol (PROVENTIL HFA;VENTOLIN HFA) 108 (90 Base) MCG/ACT inhaler Inhale 2 puffs into the lungs every 6 (six) hours as needed for wheezing or shortness of breath.    Marland Kitchen atorvastatin (LIPITOR) 80 MG tablet TAKE ONE TABLET BY MOUTH DAILY AT 6 P.M. NEEDS APPOINTMENT FOR FUTURE REFILLS. 90 tablet 3  . doxycycline (VIBRA-TABS) 100 MG tablet Take 1 tablet (100 mg total) by mouth 2 (two) times daily. 14 tablet 0  . furosemide (LASIX) 40 MG tablet Take 1 tablet by mouth daily for 3 days then taking only as needed. (Patient not taking: Reported on 05/10/2018) 10 tablet 0  . isosorbide mononitrate (IMDUR) 30 MG 24 hr tablet Take 1 tablet (30 mg total) by mouth daily. 90 tablet 3  . metoprolol tartrate (LOPRESSOR) 25 MG tablet TAKE ONE TABLET BY MOUTH TWICE DAILY (PLEASE  CONTACT  OFFICE  FOR  ADDITIONAL  REFILLS  ) (Patient taking differently: TAKE ONE TABLET BY MOUTH DAILY (PLEASE  CONTACT  OFFICE  FOR  ADDITIONAL  REFILLS  )) 180 tablet 2  . nitroGLYCERIN (NITROSTAT) 0.4 MG SL tablet Place 1 tablet (0.4 mg total) under the tongue every 5 (five) minutes as needed for chest pain. (Patient not taking: Reported on 06/16/2018) 25 tablet 3  .  prochlorperazine (COMPAZINE) 10 MG tablet Take 1 tablet (10 mg total) by mouth every 6 (six) hours as needed for nausea or vomiting. (Patient not taking: Reported on 05/27/2018) 30 tablet 0  . rivaroxaban (XARELTO) 20 MG TABS tablet Take 1 tablet (20 mg total) by mouth daily with supper. 30 tablet 1  . Rivaroxaban 15 & 20 MG TBPK Take as directed on package: Take one 15mg  tablet by mouth twice a day with food. On Day 22, switch to one 20mg  tablet once a day with food. (Patient not taking: Reported on 06/12/2018) 51 each 0     No current facility-administered medications for this visit.     SURGICAL HISTORY:  Past Surgical History:  Procedure Laterality Date  . coratid arterty surgery    . CORONARY ANGIOPLASTY WITH STENT PLACEMENT    . CORONARY ARTERY BYPASS GRAFT    . IR GUIDED DRAIN W CATHETER PLACEMENT  04/08/2018  . IR REMOVAL OF PLURAL CATH W/CUFF  06/19/2018  . LCEA    . LEFT HEART CATH N/A 06/27/2012   Procedure: LEFT HEART CATH;  Surgeon: Troy Sine, MD;  Location: [facility] CATH LAB;  Service: Cardiovascular;  Laterality: N/A;  . triple bipass     Hx of CABG and RE do  . VASCULAR SURGERY    . VIDEO BRONCHOSCOPY Bilateral 04/07/2018   Procedure: VIDEO BRONCHOSCOPY WITHOUT FLUORO;  Surgeon: Rush Farmer, MD;  Location: [facility] ENDOSCOPY;  Service: Cardiopulmonary;  Laterality: Bilateral;    REVIEW OF SYSTEMS:  A comprehensive review of systems was negative except for: Constitutional: positive for fatigue   PHYSICAL EXAMINATION: General appearance: alert, cooperative, fatigued and no distress Head: Normocephalic, without obvious abnormality, atraumatic Neck: no adenopathy, no JVD, supple, symmetrical, trachea midline and thyroid not enlarged, symmetric, no tenderness/mass/nodules Lymph nodes: Cervical, supraclavicular, and axillary nodes normal. Resp: clear to auscultation bilaterally Back: symmetric, no curvature. ROM normal. No CVA tenderness. Cardio: regular rate and rhythm, S1, S2 normal, no murmur, click, rub or gallop GI: soft, non-tender; bowel sounds normal; no masses,  no organomegaly Extremities: extremities normal, atraumatic, no cyanosis or edema  ECOG PERFORMANCE STATUS: 1 - Symptomatic but completely ambulatory  Blood pressure (!) 152/96, pulse 88, temperature 97.7 F (36.5 C), temperature source Oral, resp. rate 20, height 5\' 10"  (1.778 m), weight 159 lb 12.8 oz (72.5 kg), SpO2 96 %.  LABORATORY DATA: Lab Results  Component Value Date   WBC 12.4 (H) 07/08/2018   HGB 10.1 (L)  07/08/2018   HCT 31.6 (L) 07/08/2018   MCV 96.9 07/08/2018   PLT 226 07/08/2018      Chemistry      Component Value Date/Time   NA 142 07/01/2018 1343   NA 140 03/21/2018 1048   K 4.9 07/01/2018 1343   CL 108 07/01/2018 1343   CO2 25 07/01/2018 1343   BUN 23 07/01/2018 1343   BUN 45 (H) 03/21/2018 1048   CREATININE 1.84 (H) 07/01/2018 1343   CREATININE 1.80 (H) 09/06/2014 1033      Component Value Date/Time   CALCIUM 9.5 07/01/2018 1343   ALKPHOS 124 07/01/2018 1343   AST 10 (L) 07/01/2018 1343   ALT 7 07/01/2018 1343   BILITOT 0.2 (L) 07/01/2018 1343       RADIOGRAPHIC STUDIES: Dg Chest 1 View  Result Date: 06/19/2018 CLINICAL DATA:  Removal PleurX catheter.  Pleural effusion. EXAM: CHEST  1 VIEW COMPARISON:  CT 06/13/2018.  Chest x-ray 04/07/2018. FINDINGS: Mediastinum hilar structures are stable. Prior CABG.  Stable cardiomegaly. Rounded mass again noted over the right mid lung. Similar finding noted on prior recent CT. Mild pleural-parenchymal thickening consistent with scarring. Mild atelectatic changes right lung base. No acute bony abnormality. IMPRESSION: 1. Rounded mass noted over the right mid lung, similar finding noted on prior recent CT. Mild pleural-parenchymal thickening consistent scarring. Mild atelectatic changes right lung base. No significant pleural effusion. No evidence of pneumothorax. No acute pulmonary abnormalities identified. 2.  Prior CABG.  Stable cardiomegaly. Electronically Signed   By: Marcello Moores  Register   On: 06/19/2018 14:46   Ct Chest W Contrast  Result Date: 06/13/2018 CLINICAL DATA:  Small cell right lung cancer. Ongoing chemotherapy. Restaging. EXAM: CT CHEST, ABDOMEN, AND PELVIS WITH CONTRAST TECHNIQUE: Multidetector CT imaging of the chest, abdomen and pelvis was performed following the standard protocol during bolus administration of intravenous contrast. CONTRAST:  49mL OMNIPAQUE IOHEXOL 300 MG/ML  SOLN COMPARISON:  04/21/2018 PET-CT.   03/24/2018 chest CT. FINDINGS: CT CHEST FINDINGS Cardiovascular: Top-normal heart size. No significant pericardial effusion/thickening. Left main and 3 vessel coronary atherosclerosis status post CABG. Atherosclerotic nonaneurysmal thoracic aorta. Normal caliber pulmonary arteries. No central pulmonary emboli. Mediastinum/Nodes: No discrete thyroid nodules. Unremarkable esophagus. No axillary adenopathy. Infiltrative right hilar/mediastinal component of the right lung mass, located between the SVC, right pulmonary artery and left atrium and measuring 4.8 x 2.6 cm (series 2/image 34) is substantially decreased from 8.8 x 5.6 cm on 03/24/2018 chest CT using similar measurement technique. Otherwise no mediastinal or left hilar adenopathy. Lungs/Pleura: No pneumothorax. Trace dependent right pleural effusion with indwelling right pleural catheter. No left pleural effusion. Moderate centrilobular and paraseptal emphysema with diffuse bronchial wall thickening. Right perihilar lung mass involving the right upper and right middle lobes measures 5.0 x 3.7 cm (series 2/image 32), significantly decreased from 9.2 x 8.5 cm using similar measurement technique. Irregular subpleural 1.5 cm apical right upper lobe nodular opacity (series 7/image 27) is stable. Thick parenchymal band in the dependent right lower lobe is compatible with scarring or atelectasis. No acute consolidative airspace disease or new significant pulmonary nodules. Musculoskeletal: No aggressive appearing focal osseous lesions. Mild thoracic spondylosis. CT ABDOMEN PELVIS FINDINGS Hepatobiliary: Normal liver size. There are several (at least 5) hypodense subcentimeter lesions scattered in the liver, too small to characterize. Comparison is limited to the 03/24/2018 CT by the absence of IV contrast on the prior scan. These lesions are not appreciably changed. No definite new liver lesions. Cholelithiasis. No biliary ductal dilatation. Pancreas: Normal, with no  mass or duct dilation. Spleen: Normal size. No mass. Adrenals/Urinary Tract: Normal adrenals. Stable severe asymmetric right renal atrophy. Stable coarsely calcified 1.5 cm lower right renal cyst. Small simple scattered left renal cysts, largest 1.9 cm in the interpolar left kidney. Hypodense left renal cortical lesions are too small to characterize and require no follow-up. Compensatory hypertrophy of the left kidney. Nonobstructing stones throughout the left kidney, largest 8 mm in the upper left kidney. No left hydronephrosis. Normal bladder. Stomach/Bowel: Normal non-distended stomach. Normal caliber small bowel with no small bowel wall thickening. Normal appendix. Right upper quadrant cecum. Marked sigmoid diverticulosis, with no large bowel wall thickening or significant pericolonic fat stranding. Vascular/Lymphatic: Atherosclerotic abdominal aorta with stable 4.1 cm infrarenal abdominal aortic aneurysm. Patent portal, splenic and renal veins. No pathologically enlarged lymph nodes in the abdomen or pelvis. Reproductive: Stable mildly enlarged prostate with coarse nonspecific internal prostatic calcifications. Other: No pneumoperitoneum, ascites or focal fluid collection. Musculoskeletal: No aggressive appearing focal osseous lesions. Moderate  lumbar spondylosis. IMPRESSION: 1. Significant partial treatment response. Perihilar right lung mass involving the right upper and middle lobes is significantly decreased in size. Infiltrative right hilar/mediastinal component of this mass is significantly decreased in size. No new or progressive metastatic disease. 2. Apical right upper lobe nodular opacity is stable and more likely nodular pleuroparenchymal scarring. 3. Trace dependent right pleural effusion with indwelling right pleural catheter. 4. Infrarenal 4.1 cm abdominal Aortic Aneurysm (ICD10-I71.9). Recommend follow-up aortic ultrasound in 1 year. This recommendation follows ACR consensus guidelines: White  Paper of the ACR Incidental Findings Committee II on Vascular Findings. J Am Coll Radiol 2013; 10:789-794. 5. Aortic Atherosclerosis (ICD10-I70.0) and Emphysema (ICD10-J43.9). 6. Additional chronic findings include: Mild prostatomegaly. Marked sigmoid diverticulosis. Cholelithiasis. Nonobstructing left nephrolithiasis. Severe right renal atrophy. Electronically Signed   By: Ilona Sorrel M.D.   On: 06/13/2018 14:44   Ct Abdomen Pelvis W Contrast  Result Date: 06/13/2018 CLINICAL DATA:  Small cell right lung cancer. Ongoing chemotherapy. Restaging. EXAM: CT CHEST, ABDOMEN, AND PELVIS WITH CONTRAST TECHNIQUE: Multidetector CT imaging of the chest, abdomen and pelvis was performed following the standard protocol during bolus administration of intravenous contrast. CONTRAST:  38mL OMNIPAQUE IOHEXOL 300 MG/ML  SOLN COMPARISON:  04/21/2018 PET-CT.  03/24/2018 chest CT. FINDINGS: CT CHEST FINDINGS Cardiovascular: Top-normal heart size. No significant pericardial effusion/thickening. Left main and 3 vessel coronary atherosclerosis status post CABG. Atherosclerotic nonaneurysmal thoracic aorta. Normal caliber pulmonary arteries. No central pulmonary emboli. Mediastinum/Nodes: No discrete thyroid nodules. Unremarkable esophagus. No axillary adenopathy. Infiltrative right hilar/mediastinal component of the right lung mass, located between the SVC, right pulmonary artery and left atrium and measuring 4.8 x 2.6 cm (series 2/image 34) is substantially decreased from 8.8 x 5.6 cm on 03/24/2018 chest CT using similar measurement technique. Otherwise no mediastinal or left hilar adenopathy. Lungs/Pleura: No pneumothorax. Trace dependent right pleural effusion with indwelling right pleural catheter. No left pleural effusion. Moderate centrilobular and paraseptal emphysema with diffuse bronchial wall thickening. Right perihilar lung mass involving the right upper and right middle lobes measures 5.0 x 3.7 cm (series 2/image 32),  significantly decreased from 9.2 x 8.5 cm using similar measurement technique. Irregular subpleural 1.5 cm apical right upper lobe nodular opacity (series 7/image 27) is stable. Thick parenchymal band in the dependent right lower lobe is compatible with scarring or atelectasis. No acute consolidative airspace disease or new significant pulmonary nodules. Musculoskeletal: No aggressive appearing focal osseous lesions. Mild thoracic spondylosis. CT ABDOMEN PELVIS FINDINGS Hepatobiliary: Normal liver size. There are several (at least 5) hypodense subcentimeter lesions scattered in the liver, too small to characterize. Comparison is limited to the 03/24/2018 CT by the absence of IV contrast on the prior scan. These lesions are not appreciably changed. No definite new liver lesions. Cholelithiasis. No biliary ductal dilatation. Pancreas: Normal, with no mass or duct dilation. Spleen: Normal size. No mass. Adrenals/Urinary Tract: Normal adrenals. Stable severe asymmetric right renal atrophy. Stable coarsely calcified 1.5 cm lower right renal cyst. Small simple scattered left renal cysts, largest 1.9 cm in the interpolar left kidney. Hypodense left renal cortical lesions are too small to characterize and require no follow-up. Compensatory hypertrophy of the left kidney. Nonobstructing stones throughout the left kidney, largest 8 mm in the upper left kidney. No left hydronephrosis. Normal bladder. Stomach/Bowel: Normal non-distended stomach. Normal caliber small bowel with no small bowel wall thickening. Normal appendix. Right upper quadrant cecum. Marked sigmoid diverticulosis, with no large bowel wall thickening or significant pericolonic fat stranding. Vascular/Lymphatic: Atherosclerotic abdominal  aorta with stable 4.1 cm infrarenal abdominal aortic aneurysm. Patent portal, splenic and renal veins. No pathologically enlarged lymph nodes in the abdomen or pelvis. Reproductive: Stable mildly enlarged prostate with coarse  nonspecific internal prostatic calcifications. Other: No pneumoperitoneum, ascites or focal fluid collection. Musculoskeletal: No aggressive appearing focal osseous lesions. Moderate lumbar spondylosis. IMPRESSION: 1. Significant partial treatment response. Perihilar right lung mass involving the right upper and middle lobes is significantly decreased in size. Infiltrative right hilar/mediastinal component of this mass is significantly decreased in size. No new or progressive metastatic disease. 2. Apical right upper lobe nodular opacity is stable and more likely nodular pleuroparenchymal scarring. 3. Trace dependent right pleural effusion with indwelling right pleural catheter. 4. Infrarenal 4.1 cm abdominal Aortic Aneurysm (ICD10-I71.9). Recommend follow-up aortic ultrasound in 1 year. This recommendation follows ACR consensus guidelines: White Paper of the ACR Incidental Findings Committee II on Vascular Findings. J Am Coll Radiol 2013; 10:789-794. 5. Aortic Atherosclerosis (ICD10-I70.0) and Emphysema (ICD10-J43.9). 6. Additional chronic findings include: Mild prostatomegaly. Marked sigmoid diverticulosis. Cholelithiasis. Nonobstructing left nephrolithiasis. Severe right renal atrophy. Electronically Signed   By: Ilona Sorrel M.D.   On: 06/13/2018 14:44   Ir Removal Of Plural Cath W/cuff  Result Date: 06/19/2018 INDICATION: History of small cell lung cancer with recurrent right sided pleural effusion. Request for removal of tunneled Pleur-x previously placed on 04/08/18 by Dr. Laurence Ferrari. Request for tube removal due to minimal output. EXAM: REMOVAL OF TUNNELED PLEURAL CATHETER MEDICATIONS: 5 mL 1% lidocaine. ANESTHESIA/SEDATION: None. COMPLICATIONS: None immediate. PROCEDURE: Informed written consent was obtained from the patient after a thorough discussion of the procedural risks, benefits and alternatives. All questions were addressed. Maximal Sterile Barrier Technique was utilized including caps, mask,  sterile gowns, sterile gloves, sterile drape, hand hygiene and skin antiseptic. A timeout was performed prior to the initiation of the procedure. The retention suture was removed. The skin around the tube was anesthetized with 1% lidocaine. The catheter cuff was exposed with blunt dissection. The entire catheter was removed without complication. Bandage placed over the tube exit site. Follow-up chest radiograph was negative for pneumothorax. IMPRESSION: Successful removal of right chest tunneled Pleur-x catheter. Read by Christopher Norse, PA-C Electronically Signed   By: Markus Daft M.D.   On: 06/19/2018 14:43    ASSESSMENT AND PLAN: This is a very pleasant 77 years old white male recently diagnosed with extensive stage small cell lung cancer and currently undergoing systemic chemotherapy with carboplatin, etoposide and Tecentriq status post 4 cycles. The patient has been tolerating this treatment well except for fatigue.  He also has a swelling of the left lower extremity secondary to extravasation.  He was treated for cellulitis with doxycycline.  He is feeling a little bit better. I recommended for the patient to proceed with cycle #5 today as scheduled I will see him back for follow-up visit in 3 weeks for evaluation before starting cycle #6 which could be only with single agent Tecentriq because of insurance covering issues. The patient was advised to call immediately if he has any concerning symptoms in the interval. The patient voices understanding of current disease status and treatment options and is in agreement with the current care plan.  All questions were answered. The patient knows to call the clinic with any problems, questions or concerns. We can certainly see the patient much sooner if necessary.  Disclaimer: This note was dictated with voice recognition software. Similar sounding words can inadvertently be transcribed and may not be corrected upon review.

## 2018-07-08 NOTE — Telephone Encounter (Signed)
Appts already scheduled per 9/10 los - no additional appts added.

## 2018-07-08 NOTE — Patient Instructions (Signed)
[facility] Discharge Instructions for Patients Receiving Chemotherapy  Today you received the following chemotherapy agents:  Tecentriq, Carboplatin, Etoposide  To help prevent nausea and vomiting after your treatment, we encourage you to take your nausea medication as prescribed.   If you develop nausea and vomiting that is not controlled by your nausea medication, call the clinic.   BELOW ARE SYMPTOMS THAT SHOULD BE REPORTED IMMEDIATELY:  *FEVER GREATER THAN 100.5 F  *CHILLS WITH OR WITHOUT FEVER  NAUSEA AND VOMITING THAT IS NOT CONTROLLED WITH YOUR NAUSEA MEDICATION  *UNUSUAL SHORTNESS OF BREATH  *UNUSUAL BRUISING OR BLEEDING  TENDERNESS IN MOUTH AND THROAT WITH OR WITHOUT PRESENCE OF ULCERS  *URINARY PROBLEMS  *BOWEL PROBLEMS  UNUSUAL RASH Items with * indicate a potential emergency and should be followed up as soon as possible.  Feel free to call the clinic should you have any questions or concerns. The clinic phone number is [phone].  Please show the [facility] at check-in to the Emergency Department and triage nurse.

## 2018-07-09 ENCOUNTER — Inpatient Hospital Stay: Payer: Medicare HMO

## 2018-07-09 VITALS — BP 149/92 | HR 82 | Temp 97.7°F | Resp 16

## 2018-07-09 DIAGNOSIS — Z5189 Encounter for other specified aftercare: Secondary | ICD-10-CM | POA: Diagnosis not present

## 2018-07-09 DIAGNOSIS — Z79899 Other long term (current) drug therapy: Secondary | ICD-10-CM | POA: Diagnosis not present

## 2018-07-09 DIAGNOSIS — L03114 Cellulitis of left upper limb: Secondary | ICD-10-CM | POA: Diagnosis not present

## 2018-07-09 DIAGNOSIS — C3411 Malignant neoplasm of upper lobe, right bronchus or lung: Secondary | ICD-10-CM

## 2018-07-09 DIAGNOSIS — Z5112 Encounter for antineoplastic immunotherapy: Secondary | ICD-10-CM | POA: Diagnosis not present

## 2018-07-09 DIAGNOSIS — Z5111 Encounter for antineoplastic chemotherapy: Secondary | ICD-10-CM | POA: Diagnosis not present

## 2018-07-09 DIAGNOSIS — C342 Malignant neoplasm of middle lobe, bronchus or lung: Secondary | ICD-10-CM | POA: Diagnosis not present

## 2018-07-09 MED ORDER — SODIUM CHLORIDE 0.9 % IV SOLN
Freq: Once | INTRAVENOUS | Status: AC
  Administered 2018-07-09: 14:00:00 via INTRAVENOUS
  Filled 2018-07-09: qty 250

## 2018-07-09 MED ORDER — DEXAMETHASONE SODIUM PHOSPHATE 10 MG/ML IJ SOLN
10.0000 mg | Freq: Once | INTRAMUSCULAR | Status: AC
  Administered 2018-07-09: 10 mg via INTRAVENOUS

## 2018-07-09 MED ORDER — SODIUM CHLORIDE 0.9 % IV SOLN
100.0000 mg/m2 | Freq: Once | INTRAVENOUS | Status: AC
  Administered 2018-07-09: 190 mg via INTRAVENOUS
  Filled 2018-07-09: qty 9.5

## 2018-07-09 MED ORDER — DEXAMETHASONE SODIUM PHOSPHATE 10 MG/ML IJ SOLN
INTRAMUSCULAR | Status: AC
  Filled 2018-07-09: qty 1

## 2018-07-09 NOTE — Patient Instructions (Signed)
[facility] Discharge Instructions for Patients Receiving Chemotherapy  Today you received the following chemotherapy agents: Etoposide  To help prevent nausea and vomiting after your treatment, we encourage you to take your nausea medication as prescribed.   If you develop nausea and vomiting that is not controlled by your nausea medication, call the clinic.   BELOW ARE SYMPTOMS THAT SHOULD BE REPORTED IMMEDIATELY:  *FEVER GREATER THAN 100.5 F  *CHILLS WITH OR WITHOUT FEVER  NAUSEA AND VOMITING THAT IS NOT CONTROLLED WITH YOUR NAUSEA MEDICATION  *UNUSUAL SHORTNESS OF BREATH  *UNUSUAL BRUISING OR BLEEDING  TENDERNESS IN MOUTH AND THROAT WITH OR WITHOUT PRESENCE OF ULCERS  *URINARY PROBLEMS  *BOWEL PROBLEMS  UNUSUAL RASH Items with * indicate a potential emergency and should be followed up as soon as possible.  Feel free to call the clinic should you have any questions or concerns. The clinic phone number is [phone].  Please show the [facility] at check-in to the Emergency Department and triage nurse.

## 2018-07-10 ENCOUNTER — Other Ambulatory Visit: Payer: Self-pay | Admitting: Cardiovascular Disease

## 2018-07-10 ENCOUNTER — Inpatient Hospital Stay: Payer: Medicare HMO

## 2018-07-10 VITALS — BP 158/93 | HR 79 | Temp 97.9°F | Resp 18

## 2018-07-10 DIAGNOSIS — Z5189 Encounter for other specified aftercare: Secondary | ICD-10-CM | POA: Diagnosis not present

## 2018-07-10 DIAGNOSIS — Z5111 Encounter for antineoplastic chemotherapy: Secondary | ICD-10-CM | POA: Diagnosis not present

## 2018-07-10 DIAGNOSIS — Z79899 Other long term (current) drug therapy: Secondary | ICD-10-CM | POA: Diagnosis not present

## 2018-07-10 DIAGNOSIS — L03114 Cellulitis of left upper limb: Secondary | ICD-10-CM | POA: Diagnosis not present

## 2018-07-10 DIAGNOSIS — Z5112 Encounter for antineoplastic immunotherapy: Secondary | ICD-10-CM | POA: Diagnosis not present

## 2018-07-10 DIAGNOSIS — C3411 Malignant neoplasm of upper lobe, right bronchus or lung: Secondary | ICD-10-CM

## 2018-07-10 DIAGNOSIS — C342 Malignant neoplasm of middle lobe, bronchus or lung: Secondary | ICD-10-CM | POA: Diagnosis not present

## 2018-07-10 MED ORDER — SODIUM CHLORIDE 0.9 % IV SOLN
100.0000 mg/m2 | Freq: Once | INTRAVENOUS | Status: AC
  Administered 2018-07-10: 190 mg via INTRAVENOUS
  Filled 2018-07-10: qty 9.5

## 2018-07-10 MED ORDER — SODIUM CHLORIDE 0.9 % IV SOLN
Freq: Once | INTRAVENOUS | Status: AC
  Administered 2018-07-10: 11:00:00 via INTRAVENOUS
  Filled 2018-07-10: qty 250

## 2018-07-10 MED ORDER — DEXAMETHASONE SODIUM PHOSPHATE 10 MG/ML IJ SOLN
10.0000 mg | Freq: Once | INTRAMUSCULAR | Status: AC
  Administered 2018-07-10: 10 mg via INTRAVENOUS

## 2018-07-10 MED ORDER — DEXAMETHASONE SODIUM PHOSPHATE 10 MG/ML IJ SOLN
INTRAMUSCULAR | Status: AC
  Filled 2018-07-10: qty 1

## 2018-07-10 NOTE — Telephone Encounter (Signed)
Rx sent to pharmacy   

## 2018-07-10 NOTE — Patient Instructions (Signed)
[facility] Discharge Instructions for Patients Receiving Chemotherapy  Today you received the following chemotherapy agents: Etoposide  To help prevent nausea and vomiting after your treatment, we encourage you to take your nausea medication as prescribed.   If you develop nausea and vomiting that is not controlled by your nausea medication, call the clinic.   BELOW ARE SYMPTOMS THAT SHOULD BE REPORTED IMMEDIATELY:  *FEVER GREATER THAN 100.5 F  *CHILLS WITH OR WITHOUT FEVER  NAUSEA AND VOMITING THAT IS NOT CONTROLLED WITH YOUR NAUSEA MEDICATION  *UNUSUAL SHORTNESS OF BREATH  *UNUSUAL BRUISING OR BLEEDING  TENDERNESS IN MOUTH AND THROAT WITH OR WITHOUT PRESENCE OF ULCERS  *URINARY PROBLEMS  *BOWEL PROBLEMS  UNUSUAL RASH Items with * indicate a potential emergency and should be followed up as soon as possible.  Feel free to call the clinic should you have any questions or concerns. The clinic phone number is [phone].  Please show the [facility] at check-in to the Emergency Department and triage nurse.

## 2018-07-11 ENCOUNTER — Inpatient Hospital Stay: Payer: Medicare HMO

## 2018-07-11 DIAGNOSIS — C3411 Malignant neoplasm of upper lobe, right bronchus or lung: Secondary | ICD-10-CM

## 2018-07-11 DIAGNOSIS — Z5112 Encounter for antineoplastic immunotherapy: Secondary | ICD-10-CM | POA: Diagnosis not present

## 2018-07-11 DIAGNOSIS — Z79899 Other long term (current) drug therapy: Secondary | ICD-10-CM | POA: Diagnosis not present

## 2018-07-11 DIAGNOSIS — Z5189 Encounter for other specified aftercare: Secondary | ICD-10-CM | POA: Diagnosis not present

## 2018-07-11 DIAGNOSIS — C342 Malignant neoplasm of middle lobe, bronchus or lung: Secondary | ICD-10-CM | POA: Diagnosis not present

## 2018-07-11 DIAGNOSIS — Z5111 Encounter for antineoplastic chemotherapy: Secondary | ICD-10-CM | POA: Diagnosis not present

## 2018-07-11 DIAGNOSIS — L03114 Cellulitis of left upper limb: Secondary | ICD-10-CM | POA: Diagnosis not present

## 2018-07-11 MED ORDER — PEGFILGRASTIM-CBQV 6 MG/0.6ML ~~LOC~~ SOSY
6.0000 mg | PREFILLED_SYRINGE | Freq: Once | SUBCUTANEOUS | Status: AC
  Administered 2018-07-11: 6 mg via SUBCUTANEOUS

## 2018-07-11 MED ORDER — PEGFILGRASTIM-CBQV 6 MG/0.6ML ~~LOC~~ SOSY
PREFILLED_SYRINGE | SUBCUTANEOUS | Status: AC
  Filled 2018-07-11: qty 0.6

## 2018-07-12 ENCOUNTER — Ambulatory Visit: Payer: Medicare HMO

## 2018-07-15 ENCOUNTER — Inpatient Hospital Stay: Payer: Medicare HMO

## 2018-07-15 DIAGNOSIS — Z5111 Encounter for antineoplastic chemotherapy: Secondary | ICD-10-CM | POA: Diagnosis not present

## 2018-07-15 DIAGNOSIS — Z5189 Encounter for other specified aftercare: Secondary | ICD-10-CM | POA: Diagnosis not present

## 2018-07-15 DIAGNOSIS — C3411 Malignant neoplasm of upper lobe, right bronchus or lung: Secondary | ICD-10-CM

## 2018-07-15 DIAGNOSIS — Z5112 Encounter for antineoplastic immunotherapy: Secondary | ICD-10-CM | POA: Diagnosis not present

## 2018-07-15 DIAGNOSIS — L03114 Cellulitis of left upper limb: Secondary | ICD-10-CM | POA: Diagnosis not present

## 2018-07-15 DIAGNOSIS — Z79899 Other long term (current) drug therapy: Secondary | ICD-10-CM | POA: Diagnosis not present

## 2018-07-15 DIAGNOSIS — C342 Malignant neoplasm of middle lobe, bronchus or lung: Secondary | ICD-10-CM | POA: Diagnosis not present

## 2018-07-15 LAB — CMP (CANCER CENTER ONLY)
ALBUMIN: 2.9 g/dL — AB (ref 3.5–5.0)
ALT: <6 U/L (ref 0–44)
ANION GAP: 6 (ref 5–15)
AST: 9 U/L — ABNORMAL LOW (ref 15–41)
Alkaline Phosphatase: 119 U/L (ref 38–126)
BILIRUBIN TOTAL: 0.7 mg/dL (ref 0.3–1.2)
BUN: 34 mg/dL — ABNORMAL HIGH (ref 8–23)
CHLORIDE: 110 mmol/L (ref 98–111)
CO2: 26 mmol/L (ref 22–32)
Calcium: 9.1 mg/dL (ref 8.9–10.3)
Creatinine: 1.58 mg/dL — ABNORMAL HIGH (ref 0.61–1.24)
GFR, Est AFR Am: 47 mL/min — ABNORMAL LOW (ref >60)
GFR, Estimated: 41 mL/min — ABNORMAL LOW (ref >60)
GLUCOSE: 90 mg/dL (ref 70–99)
POTASSIUM: 5.1 mmol/L (ref 3.5–5.1)
SODIUM: 142 mmol/L (ref 135–145)
TOTAL PROTEIN: 6.1 g/dL — AB (ref 6.5–8.1)

## 2018-07-15 LAB — CBC WITH DIFFERENTIAL (CANCER CENTER ONLY)
BASOS ABS: 0.1 10*3/uL (ref 0.0–0.1)
Basophils Relative: 1 %
Eosinophils Absolute: 0 10*3/uL (ref 0.0–0.5)
Eosinophils Relative: 0 %
HEMATOCRIT: 29.3 % — AB (ref 38.4–49.9)
Hemoglobin: 9.2 g/dL — ABNORMAL LOW (ref 13.0–17.1)
LYMPHS PCT: 7 %
Lymphs Abs: 0.8 10*3/uL — ABNORMAL LOW (ref 0.9–3.3)
MCH: 30.8 pg (ref 27.2–33.4)
MCHC: 31.4 g/dL — ABNORMAL LOW (ref 32.0–36.0)
MCV: 98 fL (ref 79.3–98.0)
MONO ABS: 0.1 10*3/uL (ref 0.1–0.9)
Monocytes Relative: 1 %
NEUTROS ABS: 9.8 10*3/uL — AB (ref 1.5–6.5)
Neutrophils Relative %: 91 %
Platelet Count: 177 10*3/uL (ref 140–400)
RBC: 2.99 MIL/uL — ABNORMAL LOW (ref 4.20–5.82)
RDW: 18.2 % — ABNORMAL HIGH (ref 11.0–14.6)
WBC Count: 10.7 10*3/uL — ABNORMAL HIGH (ref 4.0–10.3)

## 2018-07-17 ENCOUNTER — Inpatient Hospital Stay (HOSPITAL_COMMUNITY): Admission: RE | Admit: 2018-07-17 | Payer: Medicare HMO | Source: Ambulatory Visit

## 2018-07-18 ENCOUNTER — Telehealth: Payer: Self-pay

## 2018-07-18 NOTE — Telephone Encounter (Signed)
-----   Message from Roland Earl sent at 07/18/2018 10:08 AM EDT ----- Regarding: RE: needs echo and appt scheduled Patient did not want to schedule---is is taking chemo and will call back when he feels better ----- Message ----- From: Harold Hedge, CMA Sent: 07/16/2018   3:45 PM EDT To: [facility] Scheduling Subject: needs echo and appt scheduled                  Please contact patient and schedule a ECHO and 2 wk follow up appt with Angie Duke,PA-C. Patient was seen 06/12/2018.   Thanks,  [employer]

## 2018-07-22 ENCOUNTER — Inpatient Hospital Stay: Payer: Medicare HMO

## 2018-07-22 ENCOUNTER — Other Ambulatory Visit: Payer: Self-pay | Admitting: Medical

## 2018-07-22 ENCOUNTER — Inpatient Hospital Stay (HOSPITAL_BASED_OUTPATIENT_CLINIC_OR_DEPARTMENT_OTHER): Payer: Medicare HMO | Admitting: Medical

## 2018-07-22 DIAGNOSIS — Z5189 Encounter for other specified aftercare: Secondary | ICD-10-CM | POA: Diagnosis not present

## 2018-07-22 DIAGNOSIS — C3411 Malignant neoplasm of upper lobe, right bronchus or lung: Secondary | ICD-10-CM

## 2018-07-22 DIAGNOSIS — Z5111 Encounter for antineoplastic chemotherapy: Secondary | ICD-10-CM | POA: Diagnosis not present

## 2018-07-22 DIAGNOSIS — C342 Malignant neoplasm of middle lobe, bronchus or lung: Secondary | ICD-10-CM | POA: Diagnosis not present

## 2018-07-22 DIAGNOSIS — R5382 Chronic fatigue, unspecified: Secondary | ICD-10-CM

## 2018-07-22 DIAGNOSIS — Z5112 Encounter for antineoplastic immunotherapy: Secondary | ICD-10-CM | POA: Diagnosis not present

## 2018-07-22 DIAGNOSIS — L03114 Cellulitis of left upper limb: Secondary | ICD-10-CM | POA: Diagnosis not present

## 2018-07-22 DIAGNOSIS — M7989 Other specified soft tissue disorders: Secondary | ICD-10-CM

## 2018-07-22 DIAGNOSIS — Z79899 Other long term (current) drug therapy: Secondary | ICD-10-CM | POA: Diagnosis not present

## 2018-07-22 LAB — CBC WITH DIFFERENTIAL (CANCER CENTER ONLY)
BASOS PCT: 1 %
Basophils Absolute: 0.2 10*3/uL — ABNORMAL HIGH (ref 0.0–0.1)
Eosinophils Absolute: 0.1 10*3/uL (ref 0.0–0.5)
Eosinophils Relative: 0 %
HEMATOCRIT: 27 % — AB (ref 38.4–49.9)
Hemoglobin: 8.6 g/dL — ABNORMAL LOW (ref 13.0–17.1)
Lymphocytes Relative: 5 %
Lymphs Abs: 1.1 10*3/uL (ref 0.9–3.3)
MCH: 30.4 pg (ref 27.2–33.4)
MCHC: 31.8 g/dL — ABNORMAL LOW (ref 32.0–36.0)
MCV: 95.7 fL (ref 79.3–98.0)
MONO ABS: 0 10*3/uL — AB (ref 0.1–0.9)
MONOS PCT: 0 %
NEUTROS ABS: 21.3 10*3/uL — AB (ref 1.5–6.5)
Neutrophils Relative %: 94 %
Platelet Count: 38 10*3/uL — ABNORMAL LOW (ref 140–400)
RBC: 2.82 MIL/uL — ABNORMAL LOW (ref 4.20–5.82)
RDW: 18.4 % — ABNORMAL HIGH (ref 11.0–14.6)
WBC Count: 22.7 10*3/uL — ABNORMAL HIGH (ref 4.0–10.3)

## 2018-07-22 LAB — CMP (CANCER CENTER ONLY)
ALK PHOS: 143 U/L — AB (ref 38–126)
ALT: 7 U/L (ref 0–44)
ANION GAP: 6 (ref 5–15)
AST: 8 U/L — ABNORMAL LOW (ref 15–41)
Albumin: 3 g/dL — ABNORMAL LOW (ref 3.5–5.0)
BUN: 21 mg/dL (ref 8–23)
CO2: 28 mmol/L (ref 22–32)
Calcium: 9.3 mg/dL (ref 8.9–10.3)
Chloride: 108 mmol/L (ref 98–111)
Creatinine: 1.8 mg/dL — ABNORMAL HIGH (ref 0.61–1.24)
GFR, EST AFRICAN AMERICAN: 40 mL/min — AB (ref >60)
GFR, Estimated: 35 mL/min — ABNORMAL LOW (ref >60)
Glucose, Bld: 107 mg/dL — ABNORMAL HIGH (ref 70–99)
POTASSIUM: 4.2 mmol/L (ref 3.5–5.1)
SODIUM: 142 mmol/L (ref 135–145)
TOTAL PROTEIN: 6.4 g/dL — AB (ref 6.5–8.1)
Total Bilirubin: 0.2 mg/dL — ABNORMAL LOW (ref 0.3–1.2)

## 2018-07-22 LAB — TSH: TSH: 0.589 u[IU]/mL (ref 0.320–4.118)

## 2018-07-23 ENCOUNTER — Telehealth: Payer: Self-pay | Admitting: *Deleted

## 2018-07-23 NOTE — Telephone Encounter (Signed)
TC from pt's daughter stating that her father is experiencing swelling in his left hand after a possible chemo infiltration during last chemo (910/19 through 07/10/18) He was to be seen yesterday (07/23/18) but didn't wait to be seen. Daughter states his hand is getting bigger and red and warm to the touch. Denies fever/chills. Pt lives in [place] and says he cannot get here today. Offered [facility] appt for tomorrow and he accepted for 10:30  Instructed daughter to have pt keep his hand/arm elevated, take tylenol as needed for pain and cool compresses for comfort.  She voiced understanding.  High priority schedule message sent for [facility] appt tomorrow

## 2018-07-24 ENCOUNTER — Inpatient Hospital Stay: Payer: Medicare HMO

## 2018-07-24 ENCOUNTER — Ambulatory Visit (HOSPITAL_BASED_OUTPATIENT_CLINIC_OR_DEPARTMENT_OTHER)
Admission: RE | Admit: 2018-07-24 | Discharge: 2018-07-24 | Disposition: A | Discharge status: Home / Self Care | Payer: Medicare HMO | Source: Ambulatory Visit | Attending: Medical | Admitting: Medical

## 2018-07-24 ENCOUNTER — Inpatient Hospital Stay (HOSPITAL_BASED_OUTPATIENT_CLINIC_OR_DEPARTMENT_OTHER): Payer: Medicare HMO | Admitting: Medical

## 2018-07-24 VITALS — BP 135/73 | HR 93 | Temp 97.5°F | Resp 21 | Ht 70.0 in | Wt 159.9 lb

## 2018-07-24 DIAGNOSIS — R5383 Other fatigue: Secondary | ICD-10-CM | POA: Diagnosis not present

## 2018-07-24 DIAGNOSIS — Z79899 Other long term (current) drug therapy: Secondary | ICD-10-CM | POA: Diagnosis not present

## 2018-07-24 DIAGNOSIS — L03114 Cellulitis of left upper limb: Secondary | ICD-10-CM | POA: Diagnosis not present

## 2018-07-24 DIAGNOSIS — A419 Sepsis, unspecified organism: Secondary | ICD-10-CM | POA: Diagnosis not present

## 2018-07-24 DIAGNOSIS — M7989 Other specified soft tissue disorders: Secondary | ICD-10-CM

## 2018-07-24 DIAGNOSIS — C3411 Malignant neoplasm of upper lobe, right bronchus or lung: Secondary | ICD-10-CM

## 2018-07-24 DIAGNOSIS — R509 Fever, unspecified: Secondary | ICD-10-CM | POA: Diagnosis not present

## 2018-07-24 DIAGNOSIS — Z5111 Encounter for antineoplastic chemotherapy: Secondary | ICD-10-CM | POA: Diagnosis not present

## 2018-07-24 DIAGNOSIS — S6992XA Unspecified injury of left wrist, hand and finger(s), initial encounter: Secondary | ICD-10-CM | POA: Diagnosis not present

## 2018-07-24 DIAGNOSIS — C342 Malignant neoplasm of middle lobe, bronchus or lung: Secondary | ICD-10-CM

## 2018-07-24 DIAGNOSIS — I10 Essential (primary) hypertension: Secondary | ICD-10-CM

## 2018-07-24 DIAGNOSIS — Z5112 Encounter for antineoplastic immunotherapy: Secondary | ICD-10-CM | POA: Diagnosis not present

## 2018-07-24 DIAGNOSIS — Z86718 Personal history of other venous thrombosis and embolism: Secondary | ICD-10-CM

## 2018-07-24 DIAGNOSIS — Z5189 Encounter for other specified aftercare: Secondary | ICD-10-CM | POA: Diagnosis not present

## 2018-07-24 LAB — CMP (CANCER CENTER ONLY)
ALBUMIN: 3.2 g/dL — AB (ref 3.5–5.0)
ALK PHOS: 164 U/L — AB (ref 38–126)
ALT: 8 U/L (ref 0–44)
AST: 10 U/L — AB (ref 15–41)
Anion gap: 8 (ref 5–15)
BILIRUBIN TOTAL: 0.3 mg/dL (ref 0.3–1.2)
BUN: 23 mg/dL (ref 8–23)
CALCIUM: 9.5 mg/dL (ref 8.9–10.3)
CO2: 27 mmol/L (ref 22–32)
CREATININE: 2.13 mg/dL — AB (ref 0.61–1.24)
Chloride: 110 mmol/L (ref 98–111)
GFR, EST NON AFRICAN AMERICAN: 28 mL/min — AB (ref >60)
GFR, Est AFR Am: 33 mL/min — ABNORMAL LOW (ref >60)
GLUCOSE: 94 mg/dL (ref 70–99)
Potassium: 4.2 mmol/L (ref 3.5–5.1)
Sodium: 145 mmol/L (ref 135–145)
TOTAL PROTEIN: 7.1 g/dL (ref 6.5–8.1)

## 2018-07-24 LAB — CBC WITH DIFFERENTIAL (CANCER CENTER ONLY)
BASOS ABS: 0.1 10*3/uL (ref 0.0–0.1)
Basophils Relative: 0 %
Eosinophils Absolute: 0.1 10*3/uL (ref 0.0–0.5)
Eosinophils Relative: 1 %
HEMATOCRIT: 29.1 % — AB (ref 38.4–49.9)
HEMOGLOBIN: 9.2 g/dL — AB (ref 13.0–17.1)
LYMPHS PCT: 6 %
Lymphs Abs: 1.5 10*3/uL (ref 0.9–3.3)
MCH: 30.7 pg (ref 27.2–33.4)
MCHC: 31.8 g/dL — ABNORMAL LOW (ref 32.0–36.0)
MCV: 96.3 fL (ref 79.3–98.0)
Monocytes Absolute: 1.9 10*3/uL — ABNORMAL HIGH (ref 0.1–0.9)
Monocytes Relative: 7 %
NEUTROS ABS: 22.7 10*3/uL — AB (ref 1.5–6.5)
NEUTROS PCT: 86 %
Platelet Count: 67 10*3/uL — ABNORMAL LOW (ref 140–400)
RBC: 3.02 MIL/uL — AB (ref 4.20–5.82)
RDW: 18.4 % — ABNORMAL HIGH (ref 11.0–14.6)
WBC Count: 26.2 10*3/uL — ABNORMAL HIGH (ref 4.0–10.3)

## 2018-07-24 MED ORDER — SODIUM CHLORIDE 0.9 % IV SOLN
Freq: Once | INTRAVENOUS | Status: AC
  Administered 2018-07-24: 12:00:00 via INTRAVENOUS
  Filled 2018-07-24: qty 250

## 2018-07-24 MED ORDER — SODIUM CHLORIDE 0.9 % IV SOLN
2.0000 g | Freq: Once | INTRAVENOUS | Status: AC
  Administered 2018-07-24: 2 g via INTRAVENOUS
  Filled 2018-07-24: qty 20

## 2018-07-24 MED ORDER — AMOXICILLIN-POT CLAVULANATE 500-125 MG PO TABS: 1.0000 | ORAL_TABLET | Freq: Two times a day (BID) | ORAL | 0 refills | Status: DC

## 2018-07-24 NOTE — Patient Instructions (Signed)
Ceftriaxone injection What is this medicine? CEFTRIAXONE (sef try AX one) is a cephalosporin antibiotic. It is used to treat certain kinds of bacterial infections. It will not work for colds, flu, or other viral infections. This medicine may be used for other purposes; ask your health care provider or pharmacist if you have questions. COMMON BRAND NAME(S): Rocephin What should I tell my health care provider before I take this medicine? They need to know if you have any of these conditions: -any chronic illness -bowel disease, like colitis -both kidney and liver disease -high bilirubin level in newborn patients -an unusual or allergic reaction to ceftriaxone, other cephalosporin or penicillin antibiotics, foods, dyes, or preservatives -pregnant or trying to get pregnant -breast-feeding How should I use this medicine? This medicine is injected into a muscle or infused it into a vein. It is usually given in a medical office or clinic. If you are to give this medicine you will be taught how to inject it. Follow instructions carefully. Use your doses at regular intervals. Do not take your medicine more often than directed. Do not skip doses or stop your medicine early even if you feel better. Do not stop taking except on your doctor's advice. Talk to your pediatrician regarding the use of this medicine in children. Special care may be needed. Overdosage: If you think you have taken too much of this medicine contact a poison control center or emergency room at once. NOTE: This medicine is only for you. Do not share this medicine with others. What if I miss a dose? If you miss a dose, take it as soon as you can. If it is almost time for your next dose, take only that dose. Do not take double or extra doses. What may interact with this medicine? Do not take this medicine with any of the following medications: -intravenous calcium This medicine may also interact with the following medications: -birth  control pills This list may not describe all possible interactions. Give your health care provider a list of all the medicines, herbs, non-prescription drugs, or dietary supplements you use. Also tell them if you smoke, drink alcohol, or use illegal drugs. Some items may interact with your medicine. What should I watch for while using this medicine? Tell your doctor or health care professional if your symptoms do not improve or if they get worse. Do not treat diarrhea with over the counter products. Contact your doctor if you have diarrhea that lasts more than 2 days or if it is severe and watery. If you are being treated for a sexually transmitted disease, avoid sexual contact until you have finished your treatment. Having sex can infect your sexual partner. Calcium may bind to this medicine and cause lung or kidney problems. Avoid calcium products while taking this medicine and for 48 hours after taking the last dose of this medicine. What side effects may I notice from receiving this medicine? Side effects that you should report to your doctor or health care professional as soon as possible: -allergic reactions like skin rash, itching or hives, swelling of the face, lips, or tongue -breathing problems -fever, chills -irregular heartbeat -pain when passing urine -seizures -stomach pain, cramps -unusual bleeding, bruising -unusually weak or tired Side effects that usually do not require medical attention (report to your doctor or health care professional if they continue or are bothersome): -diarrhea -dizzy, drowsy -headache -nausea, vomiting -pain, swelling, irritation where injected -stomach upset -sweating This list may not describe all possible side effects.  Call your doctor for medical advice about side effects. You may report side effects to FDA at 1-800-FDA-1088. Where should I keep my medicine? Keep out of the reach of children. Store at room temperature below 25 degrees C (77  degrees F). Protect from light. Throw away any unused vials after the expiration date. NOTE: This sheet is a summary. It may not cover all possible information. If you have questions about this medicine, talk to your doctor, pharmacist, or health care provider.  2018 Elsevier/Gold Standard (2014-05-03 09:14:54)

## 2018-07-24 NOTE — Progress Notes (Signed)
LUE venous duplex prelim: negative for DVT and SVT. Landry Mellow, RDMS, RVT   Called results to Sandi Mealy

## 2018-07-25 ENCOUNTER — Emergency Department (HOSPITAL_COMMUNITY): Payer: Medicare HMO

## 2018-07-25 ENCOUNTER — Inpatient Hospital Stay (HOSPITAL_COMMUNITY)
Admission: EM | Admit: 2018-07-25 | Discharge: 2018-07-31 | DRG: 872 | Disposition: A | Discharge status: Home / Self Care | Payer: Medicare HMO | Attending: Family Medicine | Admitting: Family Medicine

## 2018-07-25 ENCOUNTER — Encounter (HOSPITAL_COMMUNITY): Payer: Self-pay | Admitting: Emergency Medicine

## 2018-07-25 ENCOUNTER — Other Ambulatory Visit: Payer: Self-pay

## 2018-07-25 DIAGNOSIS — N183 Chronic kidney disease, stage 3 unspecified: Secondary | ICD-10-CM | POA: Diagnosis present

## 2018-07-25 DIAGNOSIS — E785 Hyperlipidemia, unspecified: Secondary | ICD-10-CM

## 2018-07-25 DIAGNOSIS — A419 Sepsis, unspecified organism: Secondary | ICD-10-CM | POA: Diagnosis not present

## 2018-07-25 DIAGNOSIS — S6992XA Unspecified injury of left wrist, hand and finger(s), initial encounter: Secondary | ICD-10-CM | POA: Diagnosis not present

## 2018-07-25 DIAGNOSIS — C3411 Malignant neoplasm of upper lobe, right bronchus or lung: Secondary | ICD-10-CM | POA: Diagnosis not present

## 2018-07-25 DIAGNOSIS — I825Z2 Chronic embolism and thrombosis of unspecified deep veins of left distal lower extremity: Secondary | ICD-10-CM

## 2018-07-25 DIAGNOSIS — F17210 Nicotine dependence, cigarettes, uncomplicated: Secondary | ICD-10-CM | POA: Diagnosis present

## 2018-07-25 DIAGNOSIS — Z955 Presence of coronary angioplasty implant and graft: Secondary | ICD-10-CM

## 2018-07-25 DIAGNOSIS — Z720 Tobacco use: Secondary | ICD-10-CM

## 2018-07-25 DIAGNOSIS — I2510 Atherosclerotic heart disease of native coronary artery without angina pectoris: Secondary | ICD-10-CM | POA: Diagnosis not present

## 2018-07-25 DIAGNOSIS — R5383 Other fatigue: Secondary | ICD-10-CM | POA: Diagnosis not present

## 2018-07-25 DIAGNOSIS — I82409 Acute embolism and thrombosis of unspecified deep veins of unspecified lower extremity: Secondary | ICD-10-CM | POA: Diagnosis present

## 2018-07-25 DIAGNOSIS — I10 Essential (primary) hypertension: Secondary | ICD-10-CM

## 2018-07-25 DIAGNOSIS — M7989 Other specified soft tissue disorders: Secondary | ICD-10-CM | POA: Diagnosis not present

## 2018-07-25 DIAGNOSIS — I25810 Atherosclerosis of coronary artery bypass graft(s) without angina pectoris: Secondary | ICD-10-CM | POA: Diagnosis not present

## 2018-07-25 DIAGNOSIS — Z8249 Family history of ischemic heart disease and other diseases of the circulatory system: Secondary | ICD-10-CM

## 2018-07-25 DIAGNOSIS — D61818 Other pancytopenia: Secondary | ICD-10-CM | POA: Diagnosis present

## 2018-07-25 DIAGNOSIS — D6959 Other secondary thrombocytopenia: Secondary | ICD-10-CM | POA: Diagnosis present

## 2018-07-25 DIAGNOSIS — I129 Hypertensive chronic kidney disease with stage 1 through stage 4 chronic kidney disease, or unspecified chronic kidney disease: Secondary | ICD-10-CM | POA: Diagnosis present

## 2018-07-25 DIAGNOSIS — Z7901 Long term (current) use of anticoagulants: Secondary | ICD-10-CM

## 2018-07-25 DIAGNOSIS — R0603 Acute respiratory distress: Secondary | ICD-10-CM | POA: Diagnosis present

## 2018-07-25 DIAGNOSIS — L03114 Cellulitis of left upper limb: Secondary | ICD-10-CM | POA: Diagnosis not present

## 2018-07-25 DIAGNOSIS — Z8711 Personal history of peptic ulcer disease: Secondary | ICD-10-CM

## 2018-07-25 DIAGNOSIS — I252 Old myocardial infarction: Secondary | ICD-10-CM

## 2018-07-25 DIAGNOSIS — Z951 Presence of aortocoronary bypass graft: Secondary | ICD-10-CM

## 2018-07-25 DIAGNOSIS — Z823 Family history of stroke: Secondary | ICD-10-CM

## 2018-07-25 DIAGNOSIS — Q600 Renal agenesis, unilateral: Secondary | ICD-10-CM

## 2018-07-25 DIAGNOSIS — R509 Fever, unspecified: Secondary | ICD-10-CM | POA: Diagnosis not present

## 2018-07-25 DIAGNOSIS — R0602 Shortness of breath: Secondary | ICD-10-CM

## 2018-07-25 DIAGNOSIS — J90 Pleural effusion, not elsewhere classified: Secondary | ICD-10-CM | POA: Diagnosis present

## 2018-07-25 DIAGNOSIS — J811 Chronic pulmonary edema: Secondary | ICD-10-CM | POA: Diagnosis present

## 2018-07-25 DIAGNOSIS — K219 Gastro-esophageal reflux disease without esophagitis: Secondary | ICD-10-CM | POA: Diagnosis present

## 2018-07-25 DIAGNOSIS — I824Z9 Acute embolism and thrombosis of unspecified deep veins of unspecified distal lower extremity: Secondary | ICD-10-CM | POA: Diagnosis not present

## 2018-07-25 DIAGNOSIS — Z86718 Personal history of other venous thrombosis and embolism: Secondary | ICD-10-CM

## 2018-07-25 LAB — COMPREHENSIVE METABOLIC PANEL
ALBUMIN: 3 g/dL — AB (ref 3.5–5.0)
ALK PHOS: 120 U/L (ref 38–126)
ALT: 9 U/L (ref 0–44)
AST: 14 U/L — AB (ref 15–41)
Anion gap: 10 (ref 5–15)
BILIRUBIN TOTAL: 0.7 mg/dL (ref 0.3–1.2)
BUN: 21 mg/dL (ref 8–23)
CALCIUM: 9 mg/dL (ref 8.9–10.3)
CO2: 24 mmol/L (ref 22–32)
CREATININE: 1.85 mg/dL — AB (ref 0.61–1.24)
Chloride: 108 mmol/L (ref 98–111)
GFR calc Af Amer: 39 mL/min — ABNORMAL LOW (ref >60)
GFR, EST NON AFRICAN AMERICAN: 34 mL/min — AB (ref >60)
GLUCOSE: 125 mg/dL — AB (ref 70–99)
Potassium: 4 mmol/L (ref 3.5–5.1)
Sodium: 142 mmol/L (ref 135–145)
TOTAL PROTEIN: 6.2 g/dL — AB (ref 6.5–8.1)

## 2018-07-25 LAB — CBC WITH DIFFERENTIAL/PLATELET
BAND NEUTROPHILS: 0 %
BLASTS: 0 %
Basophils Absolute: 0 10*3/uL (ref 0.0–0.1)
Basophils Relative: 0 %
EOS ABS: 0 10*3/uL (ref 0.0–0.7)
Eosinophils Relative: 0 %
HEMATOCRIT: 26.4 % — AB (ref 39.0–52.0)
HEMOGLOBIN: 8.3 g/dL — AB (ref 13.0–17.0)
LYMPHS PCT: 14 %
Lymphs Abs: 4.7 10*3/uL — ABNORMAL HIGH (ref 0.7–4.0)
MCH: 31.1 pg (ref 26.0–34.0)
MCHC: 31.4 g/dL (ref 30.0–36.0)
MCV: 98.9 fL (ref 78.0–100.0)
Metamyelocytes Relative: 1 %
Monocytes Absolute: 1.3 10*3/uL — ABNORMAL HIGH (ref 0.1–1.0)
Monocytes Relative: 4 %
Myelocytes: 0 %
NEUTROS ABS: 27.7 10*3/uL — AB (ref 1.7–7.7)
Neutrophils Relative %: 81 %
Other: 0 %
PROMYELOCYTES RELATIVE: 0 %
Platelets: 75 10*3/uL — ABNORMAL LOW (ref 150–400)
RBC: 2.67 MIL/uL — AB (ref 4.22–5.81)
RDW: 18.8 % — ABNORMAL HIGH (ref 11.5–15.5)
WBC: 33.7 10*3/uL — AB (ref 4.0–10.5)
nRBC: 0 /100 WBC

## 2018-07-25 LAB — URINALYSIS, ROUTINE W REFLEX MICROSCOPIC
BACTERIA UA: NONE SEEN
BILIRUBIN URINE: NEGATIVE
Glucose, UA: NEGATIVE mg/dL
KETONES UR: NEGATIVE mg/dL
LEUKOCYTES UA: NEGATIVE
NITRITE: NEGATIVE
PROTEIN: 30 mg/dL — AB
SPECIFIC GRAVITY, URINE: 1.017 (ref 1.005–1.030)
pH: 5 (ref 5.0–8.0)

## 2018-07-25 LAB — I-STAT CG4 LACTIC ACID, ED: LACTIC ACID, VENOUS: 1.7 mmol/L (ref 0.5–1.9)

## 2018-07-25 MED ORDER — ONDANSETRON HCL 4 MG/2ML IJ SOLN: 4.0000 mg | Freq: Four times a day (QID) | INTRAMUSCULAR | Status: DC | PRN

## 2018-07-25 MED ORDER — RIVAROXABAN 20 MG PO TABS
20.0000 mg | ORAL_TABLET | Freq: Every day | ORAL | Status: DC
  Administered 2018-07-26: 20 mg via ORAL
  Filled 2018-07-25: qty 1

## 2018-07-25 MED ORDER — NITROGLYCERIN 0.4 MG SL SUBL: 0.4000 mg | SUBLINGUAL_TABLET | SUBLINGUAL | Status: DC | PRN

## 2018-07-25 MED ORDER — ACETAMINOPHEN 325 MG PO TABS
650.0000 mg | ORAL_TABLET | Freq: Four times a day (QID) | ORAL | Status: DC | PRN
  Administered 2018-07-26: 650 mg via ORAL
  Filled 2018-07-25: qty 2

## 2018-07-25 MED ORDER — HYDRALAZINE HCL 20 MG/ML IJ SOLN: 5.0000 mg | INTRAMUSCULAR | Status: DC | PRN

## 2018-07-25 MED ORDER — ACETAMINOPHEN 650 MG RE SUPP: 650.0000 mg | Freq: Four times a day (QID) | RECTAL | Status: DC | PRN

## 2018-07-25 MED ORDER — SODIUM CHLORIDE 0.9 % IV BOLUS
500.0000 mL | Freq: Once | INTRAVENOUS | Status: AC
  Administered 2018-07-25: 500 mL via INTRAVENOUS

## 2018-07-25 MED ORDER — CLINDAMYCIN PHOSPHATE 600 MG/50ML IV SOLN
600.0000 mg | Freq: Once | INTRAVENOUS | Status: AC
  Administered 2018-07-25: 600 mg via INTRAVENOUS
  Filled 2018-07-25: qty 50

## 2018-07-25 MED ORDER — SODIUM CHLORIDE 0.9 % IV BOLUS
1000.0000 mL | Freq: Once | INTRAVENOUS | Status: AC
  Administered 2018-07-25: 1000 mL via INTRAVENOUS

## 2018-07-25 MED ORDER — VANCOMYCIN HCL 10 G IV SOLR
1500.0000 mg | Freq: Once | INTRAVENOUS | Status: AC
  Administered 2018-07-25: 1500 mg via INTRAVENOUS
  Filled 2018-07-25: qty 1500

## 2018-07-25 MED ORDER — SENNOSIDES-DOCUSATE SODIUM 8.6-50 MG PO TABS: 1.0000 | ORAL_TABLET | Freq: Every evening | ORAL | Status: DC | PRN

## 2018-07-25 MED ORDER — ATORVASTATIN CALCIUM 40 MG PO TABS
80.0000 mg | ORAL_TABLET | Freq: Every day | ORAL | Status: DC
  Administered 2018-07-26 – 2018-07-30 (×5): 80 mg via ORAL
  Filled 2018-07-25 (×5): qty 2

## 2018-07-25 MED ORDER — SODIUM CHLORIDE 0.9 % IV SOLN
2.0000 g | Freq: Every day | INTRAVENOUS | Status: DC
  Administered 2018-07-25 – 2018-07-29 (×5): 2 g via INTRAVENOUS
  Filled 2018-07-25 (×2): qty 2
  Filled 2018-07-25 (×3): qty 20
  Filled 2018-07-25: qty 2

## 2018-07-25 MED ORDER — SODIUM CHLORIDE 0.9 % IV SOLN
INTRAVENOUS | Status: DC
  Administered 2018-07-25 – 2018-07-27 (×4): via INTRAVENOUS

## 2018-07-25 MED ORDER — ZOLPIDEM TARTRATE 5 MG PO TABS: 5.0000 mg | ORAL_TABLET | Freq: Every evening | ORAL | Status: DC | PRN

## 2018-07-25 MED ORDER — NICOTINE 21 MG/24HR TD PT24
21.0000 mg | MEDICATED_PATCH | Freq: Every day | TRANSDERMAL | Status: DC
  Filled 2018-07-25 (×4): qty 1

## 2018-07-25 MED ORDER — ONDANSETRON HCL 4 MG PO TABS: 4.0000 mg | ORAL_TABLET | Freq: Four times a day (QID) | ORAL | Status: DC | PRN

## 2018-07-25 MED ORDER — OXYCODONE-ACETAMINOPHEN 5-325 MG PO TABS
1.0000 | ORAL_TABLET | ORAL | Status: DC | PRN
  Administered 2018-07-30: 1 via ORAL
  Filled 2018-07-25: qty 1

## 2018-07-25 MED ORDER — ISOSORBIDE MONONITRATE ER 30 MG PO TB24
30.0000 mg | ORAL_TABLET | Freq: Every day | ORAL | Status: DC
  Administered 2018-07-26 – 2018-07-31 (×6): 30 mg via ORAL
  Filled 2018-07-25 (×6): qty 1

## 2018-07-25 NOTE — ED Triage Notes (Signed)
Pt reports that he had left hand and arm swelling for 3 days. Was started on antibiotics yesterday afternoon after round of IV antibiotics. Pt had erythema and swelling to left hand and forearm.   Today patient states that he had fevers and daughter gave him tylenol.

## 2018-07-25 NOTE — ED Provider Notes (Signed)
[facility] DEPT Provider Note   CSN: [id number] Arrival date & time: 07/25/18  1712     History   Chief Complaint Chief Complaint  Patient presents with  . Fever  . Cellulitis    HPI Christopher Wilson is a 77 y.o. male.  The history is provided by the patient, a relative and medical records. No language interpreter was used.  Fever   This is a new problem. The current episode started 12 to 24 hours ago. The problem occurs constantly. The problem has been gradually improving. The maximum temperature noted was 102 to 102.9 F. The temperature was taken using an oral thermometer. Pertinent negatives include no chest pain, no diarrhea, no vomiting, no congestion, no headaches, no sore throat and no cough. He has tried nothing for the symptoms. The treatment provided no relief.    Past Medical History:  Diagnosis Date  . ACS (acute coronary syndrome), with ST elevation but stable coronary arteries on cath 06/27/12 06/27/2012  . CAD (coronary artery disease), with CABG 1989 and re-do Cabg 2002 X 3 vesssels.  progressive disease with Stent to Lt. Main, and 1st diag 2006, stent to VG to LCX 2007, with known occlusion history o  06/27/2012  . CKD (chronic kidney disease) stage 3, GFR 30-59 ml/min (HCC) 06/27/2012  . 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 Coronary artery disease   . GERD (gastroesophageal reflux disease)   . Hyperlipidemia LDL goal < 70 06/27/2012  . Hypertension   . Myocardial infarction ([place])   . Peptic ulcer   . STEMI (ST elevation myocardial infarction) ([place]) 06/27/2012  . Tobacco abuse 06/27/2012    Patient Active Problem List   Diagnosis Date Noted  . Encounter for antineoplastic immunotherapy 05/27/2018  . DVT, lower extremity ([place]) 05/27/2018  . 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 Encounter for antineoplastic chemotherapy 04/10/2018  . Goals of care, counseling/discussion 04/10/2018  . 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 Lung mass   . Small cell lung cancer ([place])   . Hypoxemia   . Pleural effusion   . Muscular deconditioning   . Dyspnea on exertion   . Shortness of breath   . History of thoracentesis   . Mass of middle lobe of right lung   . History of coronary artery bypass graft   . Chronic renal insufficiency, stage 4 (severe) (HCC)   . Pleural effusion on right 03/31/2018  . Essential hypertension 07/08/2015  . Hypotension 01/24/2014  . ACS (acute coronary syndrome), with ST elevation but stable coronary arteries on cath 06/27/12 06/27/2012  . CAD (coronary artery disease) of artery bypass graft 06/27/2012  . CAD (coronary artery disease), with CABG 1989 and re-do Cabg 2002 X 3 vesssels.  progressive disease with Stent to Lt. Main, and 1st diag 2006, stent to VG to LCX 2007, with known occlusion history o  06/27/2012  . Unilateral congenital absence of kidney 06/27/2012  . CKD (chronic kidney disease) stage 3, GFR 30-59 ml/min (HCC) 06/27/2012  . Hyperlipidemia with target LDL less than 70 06/27/2012  . Tobacco abuse 06/27/2012    Past Surgical History:  Procedure Laterality Date  . coratid arterty surgery    . CORONARY ANGIOPLASTY WITH STENT PLACEMENT    . CORONARY ARTERY BYPASS GRAFT    . IR GUIDED DRAIN W CATHETER PLACEMENT  04/08/2018  . IR REMOVAL OF PLURAL CATH W/CUFF  06/19/2018  . LCEA    .  LEFT HEART CATH N/A 06/27/2012   Procedure: LEFT HEART CATH;  Surgeon: Troy Sine, MD;  Location[facility] CATH LAB;  Service: Cardiovascular;  Laterality: N/A;  . triple bipass     Hx of CABG and RE do  . VASCULAR SURGERY    . VIDEO BRONCHOSCOPY Bilateral 04/07/2018   Procedure: VIDEO BRONCHOSCOPY WITHOUT FLUORO;  Surgeon: Rush Farmer, MD;  Location: [facility] ENDOSCOPY;  Service: Cardiopulmonary;  Laterality: Bilateral;        Home Medications    Prior to Admission medications   Medication Sig Start Date End Date Taking? Authorizing Provider    acetaminophen (TYLENOL) 325 MG tablet Take 650 mg by mouth every 6 (six) hours as needed for moderate pain.    [provider]  albuterol (PROVENTIL HFA;VENTOLIN HFA) 108 (90 Base) MCG/ACT inhaler Inhale 2 puffs into the lungs every 6 (six) hours as needed for wheezing or shortness of breath.    [provider]  amoxicillin-clavulanate (AUGMENTIN) 500-125 MG tablet Take 1 tablet (500 mg total) by mouth 2 (two) times daily. 07/24/18   Tanner, Lyndon Code., PA-C  atorvastatin (LIPITOR) 80 MG tablet TAKE ONE TABLET BY MOUTH DAILY AT 6 P.M. NEEDS APPOINTMENT FOR FUTURE REFILLS. 05/21/18   Troy Sine, MD  doxycycline (VIBRA-TABS) 100 MG tablet Take 1 tablet (100 mg total) by mouth 2 (two) times daily. 07/05/18   Truitt Merle, MD  furosemide (LASIX) 40 MG tablet Take 1 tablet by mouth daily for 3 days then taking only as needed. Patient not taking: Reported on 05/10/2018 04/30/18   Leonie Man, MD  isosorbide mononitrate (IMDUR) 30 MG 24 hr tablet Take 1 tablet (30 mg total) by mouth daily. 06/12/18 09/10/18  Ledora Bottcher, PA  metoprolol tartrate (LOPRESSOR) 25 MG tablet Take 1 tablet (25 mg total) by mouth 2 (two) times daily. 07/10/18   Troy Sine, MD  nitroGLYCERIN (NITROSTAT) 0.4 MG SL tablet Place 1 tablet (0.4 mg total) under the tongue every 5 (five) minutes as needed for chest pain. Patient not taking: Reported on 06/16/2018 08/29/17   Troy Sine, MD  prochlorperazine (COMPAZINE) 10 MG tablet Take 1 tablet (10 mg total) by mouth every 6 (six) hours as needed for nausea or vomiting. Patient not taking: Reported on 05/27/2018 04/10/18   Curt Bears, MD  rivaroxaban (XARELTO) 20 MG TABS tablet Take 1 tablet (20 mg total) by mouth daily with supper. 05/27/18   Maryanna Shape, NP  Rivaroxaban 15 & 20 MG TBPK Take as directed on package: Take one 15mg  tablet by mouth twice a day with food. On Day 22, switch to one 20mg  tablet once a day with food. Patient not taking:  Reported on 06/12/2018 05/06/18   Alla Feeling, NP    Family History Family History  Problem Relation Age of Onset  . Stroke Mother   . Heart attack Father   . Stroke Maternal Aunt     Social History Social History   Tobacco Use  . Smoking status: Current Every Day Smoker    Packs/day: 1.50  . Smokeless tobacco: Never Used  Substance Use Topics  . Alcohol use: Yes    Alcohol/week: 10.0 standard drinks    Types: 10 Cans of beer per week  . Drug use: No     Allergies   Patient has no known allergies.   Review of Systems Review of Systems  Constitutional: Positive for chills, fatigue and fever. Negative for diaphoresis.  HENT: Negative  for congestion and sore throat.   Eyes: Negative for visual disturbance.  Respiratory: Negative for cough, chest tightness, shortness of breath and wheezing.   Cardiovascular: Negative for chest pain, palpitations and leg swelling.  Gastrointestinal: Negative for constipation, diarrhea, nausea and vomiting.  Genitourinary: Negative for flank pain.  Musculoskeletal: Negative for back pain, neck pain and neck stiffness.  Skin: Positive for color change and rash. Negative for wound.  Neurological: Negative for light-headedness, numbness and headaches.  Psychiatric/Behavioral: Negative for agitation and confusion.  All other systems reviewed and are negative.    Physical Exam Updated Vital Signs BP 114/69   Pulse (!) 109   Temp 98.8 F (37.1 C) (Oral)   Resp 18   SpO2 95%   Physical Exam  Constitutional: He is oriented to person, place, and time. He appears well-developed and well-nourished. No distress.  HENT:  Head: Normocephalic and atraumatic.  Mouth/Throat: Oropharynx is clear and moist.  Eyes: Pupils are equal, round, and reactive to light. Conjunctivae and EOM are normal.  Neck: Neck supple.  Cardiovascular: Normal rate and regular rhythm.  No murmur heard. Pulmonary/Chest: Effort normal and breath sounds normal. No  respiratory distress. He has no wheezes. He has no rales. He exhibits no tenderness.  Abdominal: Soft. There is no tenderness. There is no guarding.  Musculoskeletal: He exhibits edema and tenderness.       Right forearm: He exhibits tenderness, swelling and edema.       Arms:      Right hand: He exhibits tenderness and swelling. He exhibits normal range of motion, normal capillary refill and no deformity. Normal sensation noted. Normal strength noted.       Hands: Neurological: He is alert and oriented to person, place, and time. No sensory deficit. He exhibits normal muscle tone.  Skin: Skin is warm and dry. Capillary refill takes less than 2 seconds. He is not diaphoretic. There is erythema.  Psychiatric: He has a normal mood and affect.  Nursing note and vitals reviewed.         ED Treatments / Results  Labs (all labs ordered are listed, but only abnormal results are displayed) Labs Reviewed  CBC WITH DIFFERENTIAL/PLATELET - Abnormal; Notable for the following components:      Result Value   WBC 33.7 (*)    RBC 2.67 (*)    Hemoglobin 8.3 (*)    HCT 26.4 (*)    RDW 18.8 (*)    Platelets 75 (*)    Neutro Abs 27.7 (*)    Lymphs Abs 4.7 (*)    Monocytes Absolute 1.3 (*)    All other components within normal limits  COMPREHENSIVE METABOLIC PANEL - Abnormal; Notable for the following components:   Glucose, Bld 125 (*)    Creatinine, Ser 1.85 (*)    Total Protein 6.2 (*)    Albumin 3.0 (*)    AST 14 (*)    GFR calc non Af Amer 34 (*)    GFR calc Af Amer 39 (*)    All other components within normal limits  URINALYSIS, ROUTINE W REFLEX MICROSCOPIC - Abnormal; Notable for the following components:   Hgb urine dipstick SMALL (*)    Protein, ur 30 (*)    All other components within normal limits  CULTURE, BLOOD (ROUTINE X 2)  CULTURE, BLOOD (ROUTINE X 2)  URINE CULTURE  SEDIMENTATION RATE  C-REACTIVE PROTEIN  BRAIN NATRIURETIC PEPTIDE  BASIC METABOLIC PANEL  CBC    I-STAT CG4 LACTIC ACID,  ED    EKG None  Radiology Dg Chest 2 View  Result Date: 07/25/2018 CLINICAL DATA:  Fever. EXAM: CHEST - 2 VIEW COMPARISON:  Radiographs of June 19, 2018. FINDINGS: Stable cardiomediastinal silhouette. Status post coronary artery bypass graft. No pneumothorax is noted. Left lung is clear. Stable right midlung mass is noted consistent with malignancy. Stable right lower lobe scarring is noted. Bony thorax is unremarkable. IMPRESSION: No significant change compared to prior exam. Electronically Signed   By: Marijo Conception, M.D.   On: 07/25/2018 19:01   Dg Forearm Left  Result Date: 07/25/2018 CLINICAL DATA:  Left hand and arm swelling. EXAM: LEFT FOREARM - 2 VIEW COMPARISON:  None. FINDINGS: There is no evidence of fracture or other focal bone lesions. Soft tissues are unremarkable. IMPRESSION: Normal left forearm. Electronically Signed   By: Marijo Conception, M.D.   On: 07/25/2018 18:58   Dg Hand 2 View Left  Result Date: 07/25/2018 CLINICAL DATA:  Acute left hand pain and swelling. EXAM: LEFT HAND - 2 VIEW COMPARISON:  None. FINDINGS: There is no evidence of fracture or dislocation. Narrowing of radiocarpal joint is noted consistent with degenerative joint disease. No lytic destruction is seen to suggest osteomyelitis. Soft tissues are unremarkable. IMPRESSION: No acute abnormality seen in the left hand. Electronically Signed   By: Marijo Conception, M.D.   On: 07/25/2018 18:59    Procedures Procedures (including critical care time)  CRITICAL CARE Performed by: Gwenyth Allegra Kathie Posa Total critical care time: 35 minutes Critical care time was exclusive of separately billable procedures and treating other patients. Critical care was necessary to treat or prevent imminent or life-threatening deterioration. Critical care was time spent personally by me on the following activities: development of treatment plan with patient and/or surrogate as well as nursing,  discussions with consultants, evaluation of patient's response to treatment, examination of patient, obtaining history from patient or surrogate, ordering and performing treatments and interventions, ordering and review of laboratory studies, ordering and review of radiographic studies, pulse oximetry and re-evaluation of patient's condition.   Medications Ordered in ED Medications  atorvastatin (LIPITOR) tablet 80 mg (has no administration in time range)  isosorbide mononitrate (IMDUR) 24 hr tablet 30 mg (has no administration in time range)  nitroGLYCERIN (NITROSTAT) SL tablet 0.4 mg (has no administration in time range)  rivaroxaban (XARELTO) tablet 20 mg (has no administration in time range)  0.9 %  sodium chloride infusion ( Intravenous New Bag/Given 07/25/18 2241)  cefTRIAXone (ROCEPHIN) 2 g in sodium chloride 0.9 % 100 mL IVPB ( Intravenous Stopped 07/25/18 2309)  oxyCODONE-acetaminophen (PERCOCET/ROXICET) 5-325 MG per tablet 1 tablet (has no administration in time range)  nicotine (NICODERM CQ - dosed in mg/24 hours) patch 21 mg (has no administration in time range)  acetaminophen (TYLENOL) tablet 650 mg (has no administration in time range)    Or  acetaminophen (TYLENOL) suppository 650 mg (has no administration in time range)  senna-docusate (Senokot-S) tablet 1 tablet (has no administration in time range)  ondansetron (ZOFRAN) tablet 4 mg (has no administration in time range)    Or  ondansetron (ZOFRAN) injection 4 mg (has no administration in time range)  hydrALAZINE (APRESOLINE) injection 5 mg (has no administration in time range)  zolpidem (AMBIEN) tablet 5 mg (has no administration in time range)  vancomycin (VANCOCIN) 1,500 mg in sodium chloride 0.9 % 500 mL IVPB (1,500 mg Intravenous New Bag/Given 07/25/18 2356)  sodium chloride 0.9 % bolus 500 mL ( Intravenous  Stopped 07/25/18 2329)  clindamycin (CLEOCIN) IVPB 600 mg (0 mg Intravenous Stopped 07/25/18 2227)  sodium chloride 0.9  % bolus 1,000 mL (0 mLs Intravenous Stopped 07/25/18 2348)     Initial Impression / Assessment and Plan / ED Course  I have reviewed the triage vital signs and the nursing notes.  Pertinent labs & imaging results that were available during my care of the patient were reviewed by me and considered in my medical decision making (see chart for details).     Christopher Wilson is a 77 y.o. male with a past medical history significant for lung cancer on chemotherapy, CAD status post CABG, CKD, hypertension, hyperlipidemia, prior DVT on Xarelto, and ongoing left arm cellulitis who presents with worsening pain, swelling, erythema, of the left arm and new fevers.  Patient accompanied by family who reports that for the last 2 weeks, patient has had pain and swelling of the left arm.  He reports that he thinks his chemotherapy infiltrated several weeks ago during infusion in his left hand.  He initially had redness and was started on antibiotics.  He says he completed his antibiotics but his redness persisted.  He says that several days ago he was started again on antibiotics and received a dose of IV antibiotics.  He reports that he had a negative left upper extremity DVT study yesterday.  He says that today he developed fevers today that is up to 102.9 at home.  He reports she is having no cough congestion or urinary symptoms but is feeling worse.  He reports she is having pain and swelling worsening in his left hand and the redness is spreading up his forearm into his upper arm.  He reports his pain is moderate.  He is right-handed.  He denies any other complaints.  Next  On exam, patient has erythema in the left forearm and just proximal to the left medial elbow.  He also has redness on the dorsum of his hand.  He has symmetric grip strength but has pain with wrist movement.  He has erythema and warmth present.  Symmetric radial pulses.  Lungs clear and chest was nontender.  Abdomen was nontender.  No lower  extremity tenderness.  Patient otherwise appears well.  Clinically I am concerned about worsening cellulitis with associated fevers.  Patient will work-up to look for occult infection in the lungs or urine however I am most concerned about his arm given the worsening swelling and redness.  Patient with x-rays to look for some cutaneous gas.  Patient will screen laboratory testing.    As patient had a negative upper extremity DVT study yesterday, do not feel this needs to be repeated.  I anticipate admission for IV antibiotics giving worsening cellulitis of the left upper arm.  9:28 PM Patient's work-up shows worsening leukocytosis of 33.7.  X-rays do not show evidence of subcutaneous gas.  Patient's blood pressure began to decrease to 90/53.  Patient will be given fluids.  Blood pressure improved.  Has patient has been on antibiotics including chart review showing Augmentin being currently given, I do not feel patient is safe for discharge home.    Patient was given IV Clinda mycin and will be called for admission for IV antibiotics for failure of outpatient antibiotics for a some cellulitis spitting up his arm.   Final Clinical Impressions(s) / ED Diagnoses   Final diagnoses:  Cellulitis of left upper extremity    ED Discharge Orders    None  Clinical Impression: 1. Cellulitis of left upper extremity     Disposition: Admit  This note was prepared with assistance of Dragon voice recognition software. Occasional wrong-word or sound-a-like substitutions may have occurred due to the inherent limitations of voice recognition software.     Nemiah Bubar, Gwenyth Allegra, MD 07/26/18 [phone]

## 2018-07-25 NOTE — H&P (Addendum)
History and Physical    Bomani O [name] AGT:[id number] DOB: Aug 30, 1941 DOA: 07/25/2018  Referring MD/NP/PA:   PCP: Patient, No Pcp Per   Patient coming from:  The patient is coming from home.  At baseline, pt is independent for most of ADL.        Chief Complaint: left arm swelling and pain, fever  HPI: Wesson O [name] is a 77 y.o. 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  Patient states that he has been having swelling and pain in left arm for more than 3 weeks .He was treated with one course of doxycycline without complete recovery, then started Augmentin on 9/27, still no significant improvement.  His pain is constant, 6 out of 10 severity, sharp, nonradiating.  Left arm is also erythematous.  He states that the infection started after he had IV infiltration initially.  He developed fever of 102.7 yesterday with chills. Pt had UE venous doppler which was negative for DVT on 07/24/18.  Patient does not have chest pain, shortness breath, cough.  No nausea, vomiting, diarrhea or abdominal pain.  No symptoms of UTI or unilateral weakness.  ED Course: pt was found to have WBC 33.9, lactic acid of 1.70, stable renal function, temperature 97.5 currently, tachycardia, tachypnea, soft blood pressure, oxygen saturation 90 to 200% on room air.  Urinalysis with WBC 10-20 otherwise not impressive, chest x-ray negative.  Patient is placed on MedSurg [place] for observation.  Review of Systems:   General: has fevers, chills, no body weight gain, has fatigue HEENT: no blurry vision, hearing changes or sore throat Respiratory: no dyspnea, coughing, wheezing CV: no chest pain, no palpitations GI: no nausea, vomiting, abdominal pain, diarrhea, constipation GU: no dysuria,  burning on urination, increased urinary frequency, hematuria  Ext: no leg edema Neuro: no unilateral weakness, numbness, or tingling, no vision change or hearing loss Skin: has swelling, erythema, warmth and tenderness in left forearm MSK: No muscle spasm, no deformity, no limitation of range of movement in spin Heme: No easy bruising.  Travel history: No recent long distant travel.  Allergy: No Known Allergies  Past Medical History:  Diagnosis Date  . ACS (acute coronary syndrome), with ST elevation but stable coronary arteries on cath 06/27/12 06/27/2012  . CAD (coronary artery disease), with CABG 1989 and re-do Cabg 2002 X 3 vesssels.  progressive disease with Stent to Lt. Main, and 1st diag 2006, stent to VG to LCX 2007, with known occlusion history o  06/27/2012  . CKD (chronic kidney disease) stage 3, GFR 30-59 ml/min (HCC) 06/27/2012  . 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 Coronary artery disease   . GERD (gastroesophageal reflux disease)   . Hyperlipidemia LDL goal < 70 06/27/2012  . Hypertension   . Myocardial infarction ([place])   . Peptic ulcer   . STEMI (ST elevation myocardial infarction) ([place]) 06/27/2012  . Tobacco abuse 06/27/2012    Past Surgical History:  Procedure Laterality Date  . coratid arterty surgery    . CORONARY ANGIOPLASTY WITH STENT PLACEMENT    . CORONARY ARTERY BYPASS GRAFT    . IR GUIDED DRAIN W CATHETER PLACEMENT  04/08/2018  . IR REMOVAL OF PLURAL CATH W/CUFF  06/19/2018  . LCEA    . LEFT HEART CATH N/A 06/27/2012   Procedure: LEFT HEART CATH;  Surgeon: Troy Sine, MD;  Location: [facility] CATH LAB;  Service: Cardiovascular;  Laterality: N/A;  . triple bipass     Hx of CABG and RE do  . VASCULAR SURGERY    . VIDEO BRONCHOSCOPY Bilateral 04/07/2018   Procedure: VIDEO BRONCHOSCOPY WITHOUT FLUORO;  Surgeon: Rush Farmer, MD;  Location: [facility] ENDOSCOPY;  Service: Cardiopulmonary;  Laterality: Bilateral;    Social History:  reports that he has been smoking. He  has been smoking about 1.50 packs per day. He has never used smokeless tobacco. He reports that he drinks about 10.0 standard drinks of alcohol per week. He reports that he does not use drugs.  Family History:  Family History  Problem Relation Age of Onset  . Stroke Mother   . Heart attack Father   . Stroke Maternal Aunt      Prior to Admission medications   Medication Sig Start Date End Date Taking? Authorizing Provider  acetaminophen (TYLENOL) 325 MG tablet Take 650 mg by mouth every 6 (six) hours as needed for moderate pain.   Yes [provider]  albuterol (PROVENTIL HFA;VENTOLIN HFA) 108 (90 Base) MCG/ACT inhaler Inhale 2 puffs into the lungs every 6 (six) hours as needed for wheezing or shortness of breath.   Yes [provider]  amoxicillin-clavulanate (AUGMENTIN) 500-125 MG tablet Take 1 tablet (500 mg total) by mouth 2 (two) times daily. 07/24/18  Yes Tanner, Lyndon Code., PA-C  atorvastatin (LIPITOR) 80 MG tablet TAKE ONE TABLET BY MOUTH DAILY AT 6 P.M. NEEDS APPOINTMENT FOR FUTURE REFILLS. Patient taking differently: Take 80 mg by mouth daily at 6 PM.  05/21/18  Yes Troy Sine, MD  isosorbide mononitrate (IMDUR) 30 MG 24 hr tablet Take 1 tablet (30 mg total) by mouth daily. 06/12/18 09/10/18 Yes Duke, Tami Lin, PA  metoprolol tartrate (LOPRESSOR) 25 MG tablet Take 1 tablet (25 mg total) by mouth 2 (two) times daily. 07/10/18  Yes Troy Sine, MD  nitroGLYCERIN (NITROSTAT) 0.4 MG SL tablet Place 1 tablet (0.4 mg total) under the tongue every 5 (five) minutes as needed for chest pain. 08/29/17  Yes Troy Sine, MD  rivaroxaban (XARELTO) 20 MG TABS tablet Take 1 tablet (20 mg total) by mouth daily with supper. 05/27/18  Yes Curcio, Roselie Awkward, NP  doxycycline (VIBRA-TABS) 100 MG tablet Take 1 tablet (100 mg total) by mouth 2 (two) times daily. Patient not taking: Reported on 07/25/2018 07/05/18   Truitt Merle, MD  furosemide (LASIX) 40 MG tablet Take 1 tablet by mouth  daily for 3 days then taking only as needed. Patient not taking: Reported on 05/10/2018 04/30/18   Leonie Man, MD  prochlorperazine (COMPAZINE) 10 MG tablet Take 1 tablet (10 mg total) by mouth every 6 (six) hours as needed for nausea or vomiting. Patient not taking: Reported on 05/27/2018 04/10/18   Curt Bears, MD  Rivaroxaban 15 & 20 MG TBPK Take as directed on package: Take one 40m tablet by mouth twice a day with food. On Day 22, switch to one 231mtablet once a day with food. Patient not taking: Reported on 06/12/2018 05/06/18   BuAlla FeelingNP    Physical Exam: Vitals:   07/25/18 1826 07/25/18 1858 07/25/18 2000 07/25/18 2058  BP: 94/67 (!) 90/53 (!) 98/57 107/63  Pulse: (!) 108 (!) 108 (!) 101 100  Resp: '20 18  18  ' Temp:      TempSrc:      SpO2: 94% 95% 92%  100%   General: Not in acute distress HEENT:       Eyes: PERRL, EOMI, no scleral icterus.       ENT: No discharge from the ears and nose, no pharynx injection, no tonsillar enlargement.        Neck: No JVD, no bruit, no mass felt. Heme: No neck lymph node enlargement. Cardiac: S1/S2, RRR, No murmurs, No gallops or rubs. Respiratory: No rales, wheezing, rhonchi or rubs. GI: Soft, nondistended, nontender, no rebound pain, no organomegaly, BS present. GU: No hematuria Ext: No pitting leg edema bilaterally. 2+DP/PT pulse bilaterally. Musculoskeletal: No joint deformities, No joint redness or warmth, no limitation of ROM in spin. Skin: has swelling, erythema, warmth and tenderness in left forearm Neuro: Alert, oriented X3, cranial nerves II-XII grossly intact, moves all extremities normally. Psych: Patient is not psychotic, no suicidal or hemocidal ideation.  Labs on Admission: I have personally reviewed following labs and imaging studies  CBC: Recent Labs  Lab 07/22/18 1149 07/24/18 1123 07/25/18 1804  WBC 22.7* 26.2* 33.7*  NEUTROABS 21.3* 22.7* 27.7*  HGB 8.6* 9.2* 8.3*  HCT 27.0* 29.1* 26.4*  MCV 95.7  96.3 98.9  PLT 38* 67* 75*   Basic Metabolic Panel: Recent Labs  Lab 07/22/18 1149 07/24/18 1123 07/25/18 1804  NA 142 145 142  K 4.2 4.2 4.0  CL 108 110 108  CO2 '28 27 24  ' GLUCOSE 107* 94 125*  BUN '21 23 21  ' CREATININE 1.80* 2.13* 1.85*  CALCIUM 9.3 9.5 9.0   GFR: Estimated Creatinine Clearance: 34.8 mL/min (A) (by C-G formula based on SCr of 1.85 mg/dL (H)). Liver Function Tests: Recent Labs  Lab 07/22/18 1149 07/24/18 1123 07/25/18 1804  AST 8* 10* 14*  ALT '7 8 9  ' ALKPHOS 143* 164* 120  BILITOT 0.2* 0.3 0.7  PROT 6.4* 7.1 6.2*  ALBUMIN 3.0* 3.2* 3.0*   No results for input(s): LIPASE, AMYLASE in the last 168 hours. No results for input(s): AMMONIA in the last 168 hours. Coagulation Profile: No results for input(s): INR, PROTIME in the last 168 hours. Cardiac Enzymes: No results for input(s): CKTOTAL, CKMB, CKMBINDEX, TROPONINI in the last 168 hours. BNP (last 3 results) No results for input(s): PROBNP in the last 8760 hours. HbA1C: No results for input(s): HGBA1C in the last 72 hours. CBG: No results for input(s): GLUCAP in the last 168 hours. Lipid Profile: No results for input(s): CHOL, HDL, LDLCALC, TRIG, CHOLHDL, LDLDIRECT in the last 72 hours. Thyroid Function Tests: No results for input(s): TSH, T4TOTAL, FREET4, T3FREE, THYROIDAB in the last 72 hours. Anemia Panel: No results for input(s): VITAMINB12, FOLATE, FERRITIN, TIBC, IRON, RETICCTPCT in the last 72 hours. Urine analysis:    Component Value Date/Time   COLORURINE YELLOW 07/25/2018 1940   APPEARANCEUR CLEAR 07/25/2018 1940   LABSPEC 1.017 07/25/2018 1940   PHURINE 5.0 07/25/2018 1940   GLUCOSEU NEGATIVE 07/25/2018 1940   HGBUR SMALL (A) 07/25/2018 1940   BILIRUBINUR NEGATIVE 07/25/2018 1940   KETONESUR NEGATIVE 07/25/2018 1940   PROTEINUR 30 (A) 07/25/2018 1940   UROBILINOGEN 0.2 08/08/2007 1103   NITRITE NEGATIVE 07/25/2018 1940   LEUKOCYTESUR NEGATIVE 07/25/2018 1940   Sepsis  Labs: '@LABRCNTIP' (procalcitonin:4,lacticidven:4) )No results found for this or any previous visit (from the past 240 hour(s)).   Radiological Exams on Admission: Dg Chest 2 View  Result Date: 07/25/2018 CLINICAL DATA:  Fever. EXAM: CHEST - 2 VIEW COMPARISON:  Radiographs of June 19, 2018. FINDINGS: Stable cardiomediastinal silhouette. Status post coronary artery  bypass graft. No pneumothorax is noted. Left lung is clear. Stable right midlung mass is noted consistent with malignancy. Stable right lower lobe scarring is noted. Bony thorax is unremarkable. IMPRESSION: No significant change compared to prior exam. Electronically Signed   By: Marijo Conception, M.D.   On: 07/25/2018 19:01   Dg Forearm Left  Result Date: 07/25/2018 CLINICAL DATA:  Left hand and arm swelling. EXAM: LEFT FOREARM - 2 VIEW COMPARISON:  None. FINDINGS: There is no evidence of fracture or other focal bone lesions. Soft tissues are unremarkable. IMPRESSION: Normal left forearm. Electronically Signed   By: Marijo Conception, M.D.   On: 07/25/2018 18:58   Dg Hand 2 View Left  Result Date: 07/25/2018 CLINICAL DATA:  Acute left hand pain and swelling. EXAM: LEFT HAND - 2 VIEW COMPARISON:  None. FINDINGS: There is no evidence of fracture or dislocation. Narrowing of radiocarpal joint is noted consistent with degenerative joint disease. No lytic destruction is seen to suggest osteomyelitis. Soft tissues are unremarkable. IMPRESSION: No acute abnormality seen in the left hand. Electronically Signed   By: Marijo Conception, M.D.   On: 07/25/2018 18:59     EKG: Not done in ED, will get one.   Assessment/Plan Principal Problem:   Cellulitis of left arm Active Problems:   CAD (coronary artery disease), with CABG 1989 and re-do Cabg 2002 X 3 vesssels.  progressive disease with Stent to Lt. 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  Blood pressure is soft, but currently hemodynamically stable.  Lactic acid is normal.  Patient failed outpatient oral antibiotics treatment.  Patient is immunosuppressed, will need antibiotics with broad coverage.  - will place on Med-surg bed for obs - Empiric antimicrobial treatment with vancomycin and Rocephin (patient received 1 dose of clindamycin in ED). - PRN Zofran for nausea, and Percocet for pain - Blood cultures x 2  - ESR and CRP - will get Procalcitonin and trend lactic acid levels per sepsis protocol. - IVF: 1.5 L of NS bolus in ED, followed by 100 cc/h  CAD (coronary artery disease), with CABG 1989 and re-do Cabg 2002 X 3 vesssels.  progressive disease with Stent to Lt. Main, and 1st diag 2006, stent to VG to LCX 2007, with known occlusion history: No CP. -Continue Lipitor, Imdur, PRN nitroglycerin  CKD (chronic kidney disease) stage 3, GFR 30-59 ml/min ([place]): stable.  Baseline creatinine 1.5-1.8.  His creatinine is 1.85, BUN 21. 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 Julien Nordmann -Follow-up with Dr. Julien Nordmann  DVT, lower extremity-left leg: -continue Xarelto  Pancytopenia ([place]): Most likely due to chemotherapy. -Follow-up with CBC   DVT ppx: on Xarelto Code Status: Full code  Family Communication: None at bed  side.    Disposition Plan:  Anticipate discharge back to previous home environment Consults called:  none Admission status:   medical floor/obs       Date of Service 07/25/2018    Ivor Costa [facility] Pager [phone]  If 7PM-7AM, please contact night-coverage [email or web address] Password [facility] 07/25/2018, 10:26 PM

## 2018-07-25 NOTE — Progress Notes (Signed)
[facility]pt[facility] Management Clinic Progress Note   Christopher Wilson [id number] June 19, 1941 77 y.o.  Christopher Wilson is managed by Dr. Fanny Bien. Christopher Wilson  Actively treated with chemotherapy/immunotherapy: yes  Current Therapy: Carboplatin, etoposide and Tecentriq q. 3 weeks with Neulasta support.  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  Patient was given Rocephin 2 g IV x1 and was given a prescription for Augmentin 500-125 p.o. twice daily x7 days.  He was given a decreased dose of Augmentin based on his decreased renal function.  Swelling of the left upper extremity: The patient was referred for an ultrasound of his left upper extremity which returned showing no evidence of a DVT.  Small cell carcinoma of the lung: The patient is status post cycle 5 of carboplatin, etoposide and Tecentriq  dosed on 07/08/2018.  This is given with Neulasta support.  Please see After Visit Summary for patient specific instructions.  Future Appointments  Date Time Provider [place]  07/29/2018  9:30 AM [facility] LAB ONLY [facility] None  07/29/2018 10:00 AM Curt Bears, MD [facility] None  07/29/2018 11:00 AM [facility] INFUSION [facility] None  07/29/2018  1:45 PM Jennet Maduro, RD [facility] None  07/30/2018  1:15 PM [facility] INFUSION [facility] None  07/31/2018  1:15 PM [facility] INFUSION [facility] None  08/02/2018 12:00 PM [facility] [place] FLUSH [facility] None  08/05/2018 11:15 AM [facility] LAB 1  [facility] None    Orders Placed This Encounter  Procedures  . CBC with Differential ([place] Only)  . CMP ([place] only)       Subjective:   Patient ID:  Christopher Wilson is a 77 y.o. (DOB 07/10/1941) male.  Chief Complaint: No chief complaint on file.   HPI Christopher Wilson is a 77 year old male with a history of an extensive stage (T4, N2, M1a) small cell lung cancer was diagnosed in June 2019 when he presented with a large right perihilar mass with mediastinal invasion and a loculated malignant pleural effusion.  He is managed by Dr. Fanny Bien. Christopher Wilson.  He is treated with carboplatin, etoposide and Tecentriq q. 3 weeks with Neulasta support and was last Treated: 07/08/2018 with cycle 5 of therapy.  He contacted our office earlier this week with a report that he was having swelling in his left upper extremity.  He was to be seen on 07/22/2018 but left before being seen due to his weight time.  He reports that he had "leaking of chemotherapy" with cycle 4 of his treatment.  He had no issues of concern with his last treatment.  Despite this he reports he has had swelling in his arm with redness.  He was treated by his primary care provider with a course of doxycycline.  Despite this he continues to have swelling in his left upper extremity with redness and increased warmth in his left dorsal hand.  He denies fevers, chills, or sweats.  His labs today returned showing a WBC of 22.7, hemoglobin of 8.6, hematocrit of 27, and platelet count of 38.  He received Neulasta with his last cycle of chemotherapy.  Medications: I have reviewed the patient's current medications.  Allergies: No Known Allergies  Past Medical History:  Diagnosis Date  . ACS (acute coronary syndrome), with ST elevation but stable coronary arteries on cath 06/27/12 06/27/2012  . CAD (coronary artery disease), with CABG 1989 and re-do Cabg 2002 X 3 vesssels.  progressive disease with Stent to Lt. Main, and 1st diag  2006, stent to VG to LCX 2007, with known occlusion history o  06/27/2012  . CKD (chronic kidney disease) stage 3, GFR 30-59 ml/min (HCC) 06/27/2012  . 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 Coronary artery disease   . GERD (gastroesophageal reflux disease)   . Hyperlipidemia LDL goal < 70 06/27/2012  . Hypertension   . Myocardial infarction ([place])   . Peptic ulcer   . STEMI (ST elevation myocardial infarction) ([place]) 06/27/2012  . Tobacco abuse 06/27/2012    Past Surgical History:  Procedure Laterality Date  . coratid arterty surgery    . CORONARY ANGIOPLASTY WITH STENT PLACEMENT    . CORONARY ARTERY BYPASS GRAFT    . IR GUIDED DRAIN W CATHETER PLACEMENT  04/08/2018  . IR REMOVAL OF PLURAL CATH W/CUFF  06/19/2018  . LCEA    . LEFT HEART CATH N/A 06/27/2012   Procedure: LEFT HEART CATH;  Surgeon: Troy Sine, MD;  Location: [facility] CATH LAB;  Service: Cardiovascular;  Laterality: N/A;  . triple bipass     Hx of CABG and RE do  . VASCULAR SURGERY    . VIDEO BRONCHOSCOPY Bilateral 04/07/2018   Procedure: VIDEO BRONCHOSCOPY WITHOUT FLUORO;  Surgeon: Rush Farmer, MD;  Location: [facility] ENDOSCOPY;  Service: Cardiopulmonary;  Laterality: Bilateral;    Family History  Problem Relation Age of Onset  . Stroke Mother   . Heart attack Father   . Stroke Maternal Aunt     Social History   Socioeconomic History  . Marital status: Divorced    Spouse name: Not on file  . Number of children: Not on file  . Years of education: Not on file  . Highest education level: Not on file  Occupational History  . Not on file  Social Needs  . Financial resource strain: Not on file  . Food insecurity:    Worry: Not on file    Inability: Not on file  . Transportation needs:    Medical: Not on file    Non-medical: Not on file  Tobacco Use  . Smoking status: Current Every Day Smoker    Packs/day: 1.50  . Smokeless tobacco: Never Used  Substance and Sexual Activity  . Alcohol use: Yes    Alcohol/week:  10.0 standard drinks    Types: 10 Cans of beer per week  . Drug use: No  . Sexual activity: Not on file  Lifestyle  . Physical activity:    Days per week: Not on file    Minutes per session: Not on file  . Stress: Not on file  Relationships  . Social connections:    Talks on phone: Not on file    Gets together: Not on file    Attends religious service: Not on file    Active member of club or organization: Not on file    Attends meetings of clubs or organizations: Not on file    Relationship status: Not on file  . Intimate partner violence:    Fear of current or ex partner: Not on file  Emotionally abused: Not on file    Physically abused: Not on file    Forced sexual activity: Not on file  Other Topics Concern  . Not on file  Social History Narrative  . Not on file    Past Medical History, Surgical history, Social history, and Family history were reviewed and updated as appropriate.   Please see review of systems for further details on the patient's review from today.   Review of Systems:  Review of Systems  Constitutional: Negative for chills, diaphoresis and fever.  HENT: Negative for trouble swallowing and voice change.   Respiratory: Negative for cough, chest tightness, shortness of breath and wheezing.   Cardiovascular: Negative for chest pain and palpitations.  Gastrointestinal: Negative for abdominal pain, constipation, diarrhea, nausea and vomiting.  Musculoskeletal: Negative for back pain and myalgias.  Skin: Positive for color change.       Swelling of the left upper extremity with erythema, warmth, and swelling of the dorsal surface of the left hand.  Neurological: Negative for dizziness, light-headedness and headaches.    Objective:   Physical Exam:  BP 135/73 (BP Location: Right Arm, Patient Position: Sitting)   Pulse 93   Temp (!) 97.5 F (36.4 C) (Oral)   Resp (!) 21   Ht 5\' 10"  (1.778 m)   Wt 159 lb 14.4 oz (72.5 kg)   SpO2 96%   BMI 22.94  kg/m  ECOG: 1  Physical Exam  Constitutional: No distress.  HENT:  Head: Normocephalic and atraumatic.  Cardiovascular: Normal rate, regular rhythm and normal heart sounds. Exam reveals no gallop and no friction rub.  No murmur heard. Pulmonary/Chest: Effort normal and breath sounds normal. No respiratory distress. He has no wheezes. He has no rales.  Neurological: He is alert.  Skin: No rash noted. He is not diaphoretic. There is erythema.  Swelling of the left upper extremity with increased warmth of the dorsal surface of the left hand with increased erythema.  Capillary beds are pale.     Lab Review:     Component Value Date/Time   NA 145 07/24/2018 1123   NA 140 03/21/2018 1048   K 4.2 07/24/2018 1123   CL 110 07/24/2018 1123   CO2 27 07/24/2018 1123   GLUCOSE 94 07/24/2018 1123   BUN 23 07/24/2018 1123   BUN 45 (H) 03/21/2018 1048   CREATININE 2.13 (H) 07/24/2018 1123   CREATININE 1.80 (H) 09/06/2014 1033   CALCIUM 9.5 07/24/2018 1123   PROT 7.1 07/24/2018 1123   PROT 6.9 03/21/2018 1048   ALBUMIN 3.2 (L) 07/24/2018 1123   ALBUMIN 3.8 03/21/2018 1048   AST 10 (L) 07/24/2018 1123   ALT 8 07/24/2018 1123   ALKPHOS 164 (H) 07/24/2018 1123   BILITOT 0.3 07/24/2018 1123   GFRNONAA 28 (L) 07/24/2018 1123   GFRAA 33 (L) 07/24/2018 1123       Component Value Date/Time   WBC 26.2 (H) 07/24/2018 1123   WBC 6.1 04/03/2018 0549   RBC 3.02 (L) 07/24/2018 1123   HGB 9.2 (L) 07/24/2018 1123   HGB 12.0 (L) 03/21/2018 1048   HCT 29.1 (L) 07/24/2018 1123   HCT 36.8 (L) 03/21/2018 1048   PLT 67 (L) 07/24/2018 1123   PLT 270 03/21/2018 1048   MCV 96.3 07/24/2018 1123   MCV 91 03/21/2018 1048   MCH 30.7 07/24/2018 1123   MCHC 31.8 (L) 07/24/2018 1123   RDW 18.4 (H) 07/24/2018 1123   RDW 13.9 03/21/2018 1048  LYMPHSABS 1.5 07/24/2018 1123   MONOABS 1.9 (H) 07/24/2018 1123   EOSABS 0.1 07/24/2018 1123   BASOSABS 0.1 07/24/2018 1123    -------------------------------  Imaging from last 24 hours (if applicable):  Radiology interpretation: No results found.

## 2018-07-25 NOTE — Progress Notes (Signed)
Patient left without being seen.

## 2018-07-25 NOTE — ED Notes (Signed)
Spoke with provider verbal order for d/c 2nd lactic

## 2018-07-26 ENCOUNTER — Inpatient Hospital Stay (HOSPITAL_COMMUNITY): Payer: Medicare HMO

## 2018-07-26 DIAGNOSIS — Z86718 Personal history of other venous thrombosis and embolism: Secondary | ICD-10-CM | POA: Diagnosis not present

## 2018-07-26 DIAGNOSIS — I252 Old myocardial infarction: Secondary | ICD-10-CM | POA: Diagnosis not present

## 2018-07-26 DIAGNOSIS — Z8711 Personal history of peptic ulcer disease: Secondary | ICD-10-CM | POA: Diagnosis not present

## 2018-07-26 DIAGNOSIS — E785 Hyperlipidemia, unspecified: Secondary | ICD-10-CM | POA: Diagnosis not present

## 2018-07-26 DIAGNOSIS — Z8249 Family history of ischemic heart disease and other diseases of the circulatory system: Secondary | ICD-10-CM | POA: Diagnosis not present

## 2018-07-26 DIAGNOSIS — Z823 Family history of stroke: Secondary | ICD-10-CM | POA: Diagnosis not present

## 2018-07-26 DIAGNOSIS — F17210 Nicotine dependence, cigarettes, uncomplicated: Secondary | ICD-10-CM | POA: Diagnosis present

## 2018-07-26 DIAGNOSIS — L03114 Cellulitis of left upper limb: Secondary | ICD-10-CM | POA: Diagnosis not present

## 2018-07-26 DIAGNOSIS — R0603 Acute respiratory distress: Secondary | ICD-10-CM | POA: Diagnosis not present

## 2018-07-26 DIAGNOSIS — I2510 Atherosclerotic heart disease of native coronary artery without angina pectoris: Secondary | ICD-10-CM | POA: Diagnosis not present

## 2018-07-26 DIAGNOSIS — D61818 Other pancytopenia: Secondary | ICD-10-CM | POA: Diagnosis not present

## 2018-07-26 DIAGNOSIS — C3411 Malignant neoplasm of upper lobe, right bronchus or lung: Secondary | ICD-10-CM | POA: Diagnosis not present

## 2018-07-26 DIAGNOSIS — J90 Pleural effusion, not elsewhere classified: Secondary | ICD-10-CM | POA: Diagnosis not present

## 2018-07-26 DIAGNOSIS — D6959 Other secondary thrombocytopenia: Secondary | ICD-10-CM | POA: Diagnosis present

## 2018-07-26 DIAGNOSIS — R0602 Shortness of breath: Secondary | ICD-10-CM | POA: Diagnosis not present

## 2018-07-26 DIAGNOSIS — R600 Localized edema: Secondary | ICD-10-CM | POA: Diagnosis not present

## 2018-07-26 DIAGNOSIS — Z951 Presence of aortocoronary bypass graft: Secondary | ICD-10-CM | POA: Diagnosis not present

## 2018-07-26 DIAGNOSIS — I129 Hypertensive chronic kidney disease with stage 1 through stage 4 chronic kidney disease, or unspecified chronic kidney disease: Secondary | ICD-10-CM | POA: Diagnosis present

## 2018-07-26 DIAGNOSIS — Z955 Presence of coronary angioplasty implant and graft: Secondary | ICD-10-CM | POA: Diagnosis not present

## 2018-07-26 DIAGNOSIS — I10 Essential (primary) hypertension: Secondary | ICD-10-CM | POA: Diagnosis not present

## 2018-07-26 DIAGNOSIS — Z7901 Long term (current) use of anticoagulants: Secondary | ICD-10-CM | POA: Diagnosis not present

## 2018-07-26 DIAGNOSIS — I825Z2 Chronic embolism and thrombosis of unspecified deep veins of left distal lower extremity: Secondary | ICD-10-CM | POA: Diagnosis not present

## 2018-07-26 DIAGNOSIS — N183 Chronic kidney disease, stage 3 (moderate): Secondary | ICD-10-CM | POA: Diagnosis not present

## 2018-07-26 DIAGNOSIS — K219 Gastro-esophageal reflux disease without esophagitis: Secondary | ICD-10-CM | POA: Diagnosis present

## 2018-07-26 DIAGNOSIS — J811 Chronic pulmonary edema: Secondary | ICD-10-CM | POA: Diagnosis not present

## 2018-07-26 DIAGNOSIS — A419 Sepsis, unspecified organism: Secondary | ICD-10-CM | POA: Diagnosis not present

## 2018-07-26 DIAGNOSIS — Q600 Renal agenesis, unilateral: Secondary | ICD-10-CM | POA: Diagnosis not present

## 2018-07-26 LAB — C-REACTIVE PROTEIN: CRP: 12.9 mg/dL — ABNORMAL HIGH (ref <1.0)

## 2018-07-26 LAB — BASIC METABOLIC PANEL
Anion gap: 6 (ref 5–15)
BUN: 19 mg/dL (ref 8–23)
CHLORIDE: 112 mmol/L — AB (ref 98–111)
CO2: 23 mmol/L (ref 22–32)
CREATININE: 1.65 mg/dL — AB (ref 0.61–1.24)
Calcium: 8.4 mg/dL — ABNORMAL LOW (ref 8.9–10.3)
GFR calc Af Amer: 45 mL/min — ABNORMAL LOW (ref >60)
GFR calc non Af Amer: 39 mL/min — ABNORMAL LOW (ref >60)
GLUCOSE: 117 mg/dL — AB (ref 70–99)
POTASSIUM: 4 mmol/L (ref 3.5–5.1)
Sodium: 141 mmol/L (ref 135–145)

## 2018-07-26 LAB — CBC
HEMATOCRIT: 23.4 % — AB (ref 39.0–52.0)
HEMOGLOBIN: 7.5 g/dL — AB (ref 13.0–17.0)
MCH: 31.9 pg (ref 26.0–34.0)
MCHC: 32.1 g/dL (ref 30.0–36.0)
MCV: 99.6 fL (ref 78.0–100.0)
Platelets: 70 10*3/uL — ABNORMAL LOW (ref 150–400)
RBC: 2.35 MIL/uL — ABNORMAL LOW (ref 4.22–5.81)
RDW: 19.1 % — ABNORMAL HIGH (ref 11.5–15.5)
WBC: 26.6 10*3/uL — AB (ref 4.0–10.5)

## 2018-07-26 LAB — BRAIN NATRIURETIC PEPTIDE: B Natriuretic Peptide: 890.1 pg/mL — ABNORMAL HIGH (ref 0.0–100.0)

## 2018-07-26 LAB — SEDIMENTATION RATE: Sed Rate: 77 mm/hr — ABNORMAL HIGH (ref 0–16)

## 2018-07-26 MED ORDER — VANCOMYCIN HCL 10 G IV SOLR
1250.0000 mg | INTRAVENOUS | Status: DC
  Administered 2018-07-27 – 2018-07-28 (×2): 1250 mg via INTRAVENOUS
  Filled 2018-07-26 (×2): qty 1250

## 2018-07-26 MED ORDER — LEVALBUTEROL HCL 0.63 MG/3ML IN NEBU
0.6300 mg | INHALATION_SOLUTION | Freq: Four times a day (QID) | RESPIRATORY_TRACT | Status: DC
  Administered 2018-07-26 – 2018-07-27 (×5): 0.63 mg via RESPIRATORY_TRACT
  Filled 2018-07-26 (×4): qty 3

## 2018-07-26 MED ORDER — IPRATROPIUM BROMIDE 0.02 % IN SOLN
0.5000 mg | Freq: Four times a day (QID) | RESPIRATORY_TRACT | Status: DC
  Administered 2018-07-26 – 2018-07-27 (×5): 0.5 mg via RESPIRATORY_TRACT
  Filled 2018-07-26 (×4): qty 2.5

## 2018-07-26 NOTE — Progress Notes (Signed)
PROGRESS NOTE    Christopher Wilson  ZOX:[id number] DOB: Jul 08, 1941 DOA: 07/25/2018 PCP: Patient, No Pcp Per   Brief Narrative:  HPI per Dr. Ivor Costa on 07/25/18 Christopher Wilson is a 77 y.o. 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  Patient states that he has been having swelling and pain in left arm for more than 3 weeks .He was treated with one course of doxycycline without complete recovery, then started Augmentin on 9/27, still no significant improvement.  His pain is constant, 6 out of 10 severity, sharp, nonradiating.  Left arm is also erythematous.  He states that the infection started after he had IV infiltration initially. He developed fever of 102.7 yesterday with chills. Pt had UE venous doppler which was negative for DVT on 07/24/18.  Patient does not have chest pain, shortness breath, cough.  No nausea, vomiting, diarrhea or abdominal pain.  No symptoms of UTI or unilateral weakness.  **Feels ok. Was somewhat SOB today. Right Arm still swollen.   Assessment & Plan:   Principal Problem:   Cellulitis of left arm Active Problems:   CAD (coronary artery disease), with CABG 1989 and re-do Cabg 2002 X 3 vesssels.  progressive disease with Stent to Lt. 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  Blood pressure is  soft, but currently hemodynamically stable.  Lactic acid is normal. -Patient failed outpatient oral antibiotics treatment.  Patient is immunosuppressed, will need antibiotics with broad coverage. -Admitted to Med-Surge - Empiric antimicrobial treatment with vancomycin and Rocephin (patient received 1 dose of clindamycin in ED). -PRN Zofran for nausea, and Percocet for pain - Blood cultures x 2 showed NGTD <12 hours - ESR was 77 and CRP was 12.9 - will get Procalcitonin and trend lactic acid levels per sepsis protocol. -Given IVF: 1.5 L of NS bolus in ED, followed by 100 cc/hr but reduced to 75 mL now -Repeat CBC in AM; WBC is improving went from 33.7 is now 26.6  Wheezing and SOB -Reduce IVF Rate -CXR showed that th Right Midlung Mass is Stable. Pleural Thickening vs. Tiny Right Effusion which is unchanged -Start Nebs -Saturating well  CAD (coronary artery disease), with CABG 1989 and re-do Cabg 2002 X 3 vesssels.  progressive disease with Stent to Lt. Main, and 1st diag 2006, stent to VG to LCX 2007, with known occlusion history: No CP. -Continue Atorvastatin 80 mg po qHS, Isosorbide Mononitrate 30 mg po Daily, PRN Nitroglycerin 0.4 mg sL q71mn PRN  CKD (chronic kidney disease) stage 3, GFR 30-59 ml/min (HCC) -Stable.  Baseline creatinine 1.5-1.8.  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 Julien Nordmann -Follow-up with Dr. Julien Nordmann -Will notify  Dr. Julien Nordmann of Admission via Epic   DVT, Lower Extremity; Left Leg -Continue Home Xarelto  Hx of Pancytopenia but currently Anemic and Thrombocytopenic  -Most likely due to Chemotherapy but WBC Elevated -WBC was 26.6, Hb/Hct was 7.5/23.4, and Platelet Count was 70 this AM -Follow-up with CBC  DVT prophylaxis: Anticoagulated with Xarelto  Code Status: FULL CODE Family Communication: No family present at bedside  Disposition Plan: Remain Inpatient for continued Workup and Treatment   Consultants:   None  Procedures:  None   Antimicrobials:  Anti-infectives (From admission, onward)   Start     Dose/Rate Route Frequency Ordered Stop   07/27/18 1000  vancomycin (VANCOCIN) 1,250 mg in sodium chloride 0.9 % 250 mL IVPB     1,250 mg 166.7 mL/hr over 90 Minutes Intravenous Every 36 hours 07/26/18 0611     07/25/18 2230  vancomycin (VANCOCIN) 1,500 mg in sodium chloride 0.9 % 500 mL IVPB     1,500 mg 250 mL/hr over 120 Minutes Intravenous  Once 07/25/18 2220 07/26/18 0156   07/25/18 2215  cefTRIAXone (ROCEPHIN) 2 g in sodium chloride 0.9 % 100 mL IVPB     2 g 200 mL/hr over 30 Minutes Intravenous Daily at bedtime 07/25/18 2211     07/25/18 2115  clindamycin (CLEOCIN) IVPB 600 mg     600 mg 100 mL/hr over 30 Minutes Intravenous  Once 07/25/18 2103 07/25/18 2227     Subjective: Seen and examined at bedside and states his arm was still swollen felt slightly better.  No chest pain but did have some shortness of breath that was subjective as he was saturating 96% on room air.  No lightheadedness or dizziness.  Thinks arm is slightly better but still very swollen and tender.  No other concerns or complaints at this time  Objective: Vitals:   07/26/18 0206 07/26/18 0400 07/26/18 1341 07/26/18 1603  BP: (!) 155/88 110/65 (!) 145/83   Pulse: 100 62 100   Resp: _0 Temp: 98.2 F (36.8 C) 98.2 F (36.8 C) 98.7 F (37.1 C)   TempSrc: Oral Oral    SpO2: 97% 98% 95% 93%    Weight: 73.4 kg     Height: 5' 10" (1.778 m)       Intake/Output Summary (Last 24 hours) at 07/26/2018 1826 Last data filed at 07/26/2018 1706 Gross per 24 hour  Intake 3974.98 ml  Output 1400 ml  Net 2574.98 ml   Filed Weights   07/26/18 0206  Weight: 73.4 kg   Examination: Physical Exam:  Constitutional: WN/WD Caucasian male in NAD and appears calm and comfortable sitting in Chair bedside Eyes: Lids and conjunctivae normal, sclerae anicteric  ENMT: External Ears, Nose appear normal. Grossly normal hearing. Mucous membranes are moist. Neck: Appears normal, supple, no cervical masses, normal ROM, no appreciable thyromegaly; no JVD Respiratory: Diminished to auscultation bilaterally, no wheezing, rales, rhonchi or crackles. No accessory muscle use.  Cardiovascular: RRR, no murmurs / rubs / gallops. Abdomen: Soft, non-tender, Distended. No masses palpated. No appreciable hepatosplenomegaly. Bowel sounds positive x4.  GU: Deferred. Musculoskeletal: No  clubbing / cyanosis of digits/nails. Normal strength and muscle tone.  Skin: Left forearm swollen and erythematous and painful to palpate. No induration; Warm and dry.  Neurologic: CN 2-12 grossly intact with no focal deficits. Romberg sign and cerebellar reflexes not assessed.  Psychiatric: Normal judgment and insight. Alert and oriented x 3. Normal mood and appropriate affect.   Data Reviewed: I have personally reviewed following labs and imaging studies  CBC: Recent Labs  Lab 07/22/18 1149 07/24/18 1123 07/25/18 1804 07/26/18 0434  WBC 22.7* 26.2* 33.7* 26.6*  NEUTROABS 21.3* 22.7* 27.7*  --   HGB 8.6* 9.2* 8.3* 7.5*  HCT 27.0* 29.1* 26.4* 23.4*  MCV 95.7 96.3 98.9 99.6  PLT 38* 67* 75* 70*   Basic Metabolic Panel: Recent Labs  Lab 07/22/18 1149 07/24/18 1123 07/25/18 1804 07/26/18 0434  NA 142 145 142 141  K 4.2 4.2 4.0 4.0  CL 108 110 108 112*  CO2 _0 GLUCOSE 107* 94 125* 117*  BUN _1 CREATININE 1.80* 2.13* 1.85* 1.65*  CALCIUM 9.3 9.5 9.0 8.4*   GFR: Estimated Creatinine Clearance: 39.3 mL/min (A) (by C-G formula based on SCr of 1.65 mg/dL (H)). Liver Function Tests: Recent Labs  Lab 07/22/18 1149 07/24/18 1123 07/25/18 1804  AST 8* 10* 14*  ALT _2 ALKPHOS 143* 164* 120  BILITOT 0.2* 0.3 0.7  PROT 6.4* 7.1 6.2*  ALBUMIN 3.0* 3.2* 3.0*   No results for input(s): LIPASE, AMYLASE in the last 168 hours. No results for input(s): AMMONIA in the last 168 hours. Coagulation Profile: No results for input(s): INR, PROTIME in the last 168 hours. Cardiac Enzymes: No results for input(s): CKTOTAL, CKMB, CKMBINDEX, TROPONINI in the last 168 hours. BNP (last 3 results) No results for input(s): PROBNP in the last 8760 hours. HbA1C: No results for input(s): HGBA1C in the last 72 hours. CBG: No results for input(s): GLUCAP in the last 168 hours. Lipid Profile: No results for input(s): CHOL, HDL, LDLCALC, TRIG, CHOLHDL, LDLDIRECT in the last 72 hours. Thyroid Function Tests: No results for input(s): TSH, T4TOTAL, FREET4, T3FREE, THYROIDAB in the last 72 hours. Anemia Panel: No results for input(s): VITAMINB12, FOLATE, FERRITIN, TIBC, IRON, RETICCTPCT in the last 72 hours. Sepsis Labs: Recent Labs  Lab 07/25/18 1810  LATICACIDVEN 1.70    Recent Results (from the past 240 hour(s))  Blood culture (routine x 2)     Status: None (Preliminary result)   Collection Time: 07/25/18  6:05 PM  Result Value Ref Range Status   Specimen Description SITE NOT SPECIFIED  Final   Special Requests   Final    BOTTLES DRAWN AEROBIC AND ANAEROBIC Blood Culture results may not be optimal due to an excessive volume of blood received in culture bottles Performed at [facility], [place] [address], [place], [place] [address]    Culture   Final    NO GROWTH < 12 HOURS Performed at [facility] [address] [place], [place] [address]    Report Status  PENDING  Incomplete  Blood culture (routine x 2)     Status: None (Preliminary result)   Collection Time: 07/25/18  6:17 PM  Result Value Ref Range Status   Specimen Description   Final    BLOOD RIGHT HAND Performed at [facility] [address], [place], [place] [address]    Special Requests   Final    BOTTLES DRAWN AEROBIC AND  ANAEROBIC Blood Culture adequate volume Performed at [facility], [address] [place], [place] [address]    Culture   Final    NO GROWTH < 12 HOURS Performed at [facility], [address] [place], [place] [address]    Report Status PENDING  Incomplete    Radiology Studies: Dg Chest 2 View  Result Date: 07/25/2018 CLINICAL DATA:  Fever. EXAM: CHEST - 2 VIEW COMPARISON:  Radiographs of June 19, 2018. FINDINGS: Stable cardiomediastinal silhouette. Status post coronary artery bypass graft. No pneumothorax is noted. Left lung is clear. Stable right midlung mass is noted consistent with malignancy. Stable right lower lobe scarring is noted. Bony thorax is unremarkable. IMPRESSION: No significant change compared to prior exam. Electronically Signed   By: James  Green Jr, M.D.   On: 07/25/2018 19:01   Dg Forearm Left  Result Date: 07/25/2018 CLINICAL DATA:  Left hand and arm swelling. EXAM: LEFT FOREARM - 2 VIEW COMPARISON:  None. FINDINGS: There is no evidence of fracture or other focal bone lesions. Soft tissues are unremarkable. IMPRESSION: Normal left forearm. Electronically Signed   By: James  Green Jr, M.D.   On: 07/25/2018 18:58   Dg Hand 2 View Left  Result Date: 07/25/2018 CLINICAL DATA:  Acute left hand pain and swelling. EXAM: LEFT HAND - 2 VIEW COMPARISON:  None. FINDINGS: There is no evidence of fracture or dislocation. Narrowing of radiocarpal joint is noted consistent with degenerative joint disease. No lytic destruction is seen to suggest osteomyelitis. Soft tissues are unremarkable. IMPRESSION: No  acute abnormality seen in the left hand. Electronically Signed   By: James  Green Jr, M.D.   On: 07/25/2018 18:59   Dg Chest Port 1 View  Result Date: 07/26/2018 CLINICAL DATA:  Shortness of breath.  History of lung cancer. EXAM: PORTABLE CHEST 1 VIEW COMPARISON:  July 25, 2018 FINDINGS: No pneumothorax. The known mass in the right mid lung is stable. Pleural thickening versus a small right pleural effusion. No new infiltrate. The cardiomediastinal silhouette is stable. IMPRESSION: 1. The known right midlung mass is stable. Pleural thickening versus tiny right effusion, also unchanged. No interval changes. Electronically Signed   By: David  Williams III M.D   On: 07/26/2018 17:46   Scheduled Meds: . atorvastatin  80 mg Oral q1800  . ipratropium  0.5 mg Nebulization Q6H  . isosorbide mononitrate  30 mg Oral Daily  . levalbuterol  0.63 mg Nebulization Q6H  . nicotine  21 mg Transdermal Daily  . rivaroxaban  20 mg Oral Q supper   Continuous Infusions: . sodium chloride 75 mL/hr at 07/26/18 1706  . cefTRIAXone (ROCEPHIN)  IV Stopped (07/25/18 2309)  . [START ON 07/27/2018] vancomycin      LOS: 0 days   Omair Latif Sheikh, DO [facility] PAGER is on AMION  If 7PM-7AM, please contact night-coverage [email or web address] Password [email or web address] 07/26/2018, 6:26 PM  

## 2018-07-26 NOTE — ED Notes (Signed)
ED TO INPATIENT HANDOFF REPORT  Name/Age/Gender Christopher Wilson 77 y.o. male  Code Status    Code Status Orders  (From admission, onward)         Start     Ordered   07/25/18 2214  Full code  Continuous     07/25/18 2214        Code Status History    Date Active Date Inactive Code Status Order ID Comments User Context   03/31/2018 1235 04/09/2018 2149 Full Code [id number]  Valinda Party, DO ED   06/27/2012 0218 06/29/2012 1536 Full Code [id number]  Cecilie Kicks, NP Inpatient    Advance Directive Documentation     Most Recent Value  Type of Advance Directive  Healthcare Power of Attorney  Pre-existing out of facility DNR order (yellow form or pink MOST form)  -  "MOST" Form in Place?  -      Home/SNF/Other Home  Chief Complaint Cancer Patient/High Temp.  (Chemo Alert Card and Immunotherapy Card)  Level of Care/Admitting Diagnosis ED Disposition    ED Disposition Condition Comment   Admit  Hospital Area: [facility] [[id number]]  Level of Care: Med-Surg [16]  Diagnosis: Cellulitis of left arm [031594]  Admitting Physician: Ivor Costa [[id number]]  Attending Physician: Ivor Costa [[id number]]  PT Class (Do Not Modify): Observation [104]  PT Acc Code (Do Not Modify): Observation [10022]       Medical History Past Medical History:  Diagnosis Date  . ACS (acute coronary syndrome), with ST elevation but stable coronary arteries on cath 06/27/12 06/27/2012  . CAD (coronary artery disease), with CABG 1989 and re-do Cabg 2002 X 3 vesssels.  progressive disease with Stent to Lt. Main, and 1st diag 2006, stent to VG to LCX 2007, with known occlusion history o  06/27/2012  . CKD (chronic kidney disease) stage 3, GFR 30-59 ml/min (HCC) 06/27/2012  . 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 Coronary artery disease   . GERD (gastroesophageal reflux disease)   . Hyperlipidemia LDL goal < 70 06/27/2012  . Hypertension   . Myocardial infarction ([place])   . Peptic ulcer   .  STEMI (ST elevation myocardial infarction) ([place]) 06/27/2012  . Tobacco abuse 06/27/2012    Allergies No Known Allergies  IV Location/Drains/Wounds Patient Lines/Drains/Airways Status   Active Line/Drains/Airways    Name:   Placement date:   Placement time:   Site:   Days:   Peripheral IV 07/25/18 Right Hand   07/25/18    1821    Hand   1          Labs/Imaging Results for orders placed or performed during the hospital encounter of 07/25/18 (from the past 48 hour(s))  CBC with Differential     Status: Abnormal   Collection Time: 07/25/18  6:04 PM  Result Value Ref Range   WBC 33.7 (H) 4.0 - 10.5 K/uL    Comment: WHITE COUNT CONFIRMED ON SMEAR   RBC 2.67 (L) 4.22 - 5.81 MIL/uL   Hemoglobin 8.3 (L) 13.0 - 17.0 g/dL   HCT 26.4 (L) 39.0 - 52.0 %   MCV 98.9 78.0 - 100.0 fL   MCH 31.1 26.0 - 34.0 pg   MCHC 31.4 30.0 - 36.0 g/dL   RDW 18.8 (H) 11.5 - 15.5 %   Platelets 75 (L) 150 - 400 K/uL    Comment: SPECIMEN CHECKED FOR CLOTS REPEATED TO VERIFY PLATELET COUNT CONFIRMED BY SMEAR    Neutrophils Relative % 81 %  Lymphocytes Relative 14 %   Monocytes Relative 4 %   Eosinophils Relative 0 %   Basophils Relative 0 %   Band Neutrophils 0 %   Metamyelocytes Relative 1 %   Myelocytes 0 %   Promyelocytes Relative 0 %   Blasts 0 %   nRBC 0 0 /100 WBC   Other 0 %   Neutro Abs 27.7 (H) 1.7 - 7.7 K/uL   Lymphs Abs 4.7 (H) 0.7 - 4.0 K/uL   Monocytes Absolute 1.3 (H) 0.1 - 1.0 K/uL   Eosinophils Absolute 0.0 0.0 - 0.7 K/uL   Basophils Absolute 0.0 0.0 - 0.1 K/uL   WBC Morphology MILD LEFT SHIFT (1-5% METAS, OCC MYELO, OCC BANDS)     Comment: Performed at [facility], [place] [address], [place], [place] [address]  Comprehensive metabolic panel     Status: Abnormal   Collection Time: 07/25/18  6:04 PM  Result Value Ref Range   Sodium 142 135 - 145 mmol/L   Potassium 4.0 3.5 - 5.1 mmol/L   Chloride 108 98 - 111 mmol/L   CO2 24 22 - 32 mmol/L   Glucose, Bld 125 (H)  70 - 99 mg/dL   BUN 21 8 - 23 mg/dL   Creatinine, Ser 1.85 (H) 0.61 - 1.24 mg/dL   Calcium 9.0 8.9 - 10.3 mg/dL   Total Protein 6.2 (L) 6.5 - 8.1 g/dL   Albumin 3.0 (L) 3.5 - 5.0 g/dL   AST 14 (L) 15 - 41 U/L   ALT 9 0 - 44 U/L   Alkaline Phosphatase 120 38 - 126 U/L   Total Bilirubin 0.7 0.3 - 1.2 mg/dL   GFR calc non Af Amer 34 (L) >60 mL/min   GFR calc Af Amer 39 (L) >60 mL/min    Comment: (NOTE) The eGFR has been calculated using the CKD EPI equation. This calculation has not been validated in all clinical situations. eGFR's persistently <60 mL/min signify possible Chronic Kidney Disease.    Anion gap 10 5 - 15    Comment: Performed at [facility], [place] [address], [place], [place] [address]  Blood culture (routine x 2)     Status: None (Preliminary result)   Collection Time: 07/25/18  6:05 PM  Result Value Ref Range   Specimen Description SITE NOT SPECIFIED    Special Requests      BOTTLES DRAWN AEROBIC AND ANAEROBIC Blood Culture results may not be optimal due to an excessive volume of blood received in culture bottles Performed at [facility], [place] [address] [place], [place] [address]    Culture PENDING    Report Status PENDING   I-Stat CG4 Lactic Acid, ED     Status: None   Collection Time: 07/25/18  6:10 PM  Result Value Ref Range   Lactic Acid, Venous 1.70 0.5 - 1.9 mmol/L  Urinalysis, Routine w reflex microscopic     Status: Abnormal   Collection Time: 07/25/18  7:40 PM  Result Value Ref Range   Color, Urine YELLOW YELLOW   APPearance CLEAR CLEAR   Specific Gravity, Urine 1.017 1.005 - 1.030   pH 5.0 5.0 - 8.0   Glucose, UA NEGATIVE NEGATIVE mg/dL   Hgb urine dipstick SMALL (A) NEGATIVE   Bilirubin Urine NEGATIVE NEGATIVE   Ketones, ur NEGATIVE NEGATIVE mg/dL   Protein, ur 30 (A) NEGATIVE mg/dL   Nitrite NEGATIVE NEGATIVE   Leukocytes, UA NEGATIVE NEGATIVE   RBC / HPF 6-10  0 - 5 RBC/hpf   WBC, UA 11-20 0 - 5 WBC/hpf    Bacteria, UA NONE SEEN NONE SEEN   Squamous Epithelial / LPF 0-5 0 - 5   Mucus PRESENT     Comment: Performed at [facility], [place] [address], [place], [place] [address]   Dg Chest 2 View  Result Date: 07/25/2018 CLINICAL DATA:  Fever. EXAM: CHEST - 2 VIEW COMPARISON:  Radiographs of June 19, 2018. FINDINGS: Stable cardiomediastinal silhouette. Status post coronary artery bypass graft. No pneumothorax is noted. Left lung is clear. Stable right midlung mass is noted consistent with malignancy. Stable right lower lobe scarring is noted. Bony thorax is unremarkable. IMPRESSION: No significant change compared to prior exam. Electronically Signed   By: Marijo Conception, M.D.   On: 07/25/2018 19:01   Dg Forearm Left  Result Date: 07/25/2018 CLINICAL DATA:  Left hand and arm swelling. EXAM: LEFT FOREARM - 2 VIEW COMPARISON:  None. FINDINGS: There is no evidence of fracture or other focal bone lesions. Soft tissues are unremarkable. IMPRESSION: Normal left forearm. Electronically Signed   By: Marijo Conception, M.D.   On: 07/25/2018 18:58   Dg Hand 2 View Left  Result Date: 07/25/2018 CLINICAL DATA:  Acute left hand pain and swelling. EXAM: LEFT HAND - 2 VIEW COMPARISON:  None. FINDINGS: There is no evidence of fracture or dislocation. Narrowing of radiocarpal joint is noted consistent with degenerative joint disease. No lytic destruction is seen to suggest osteomyelitis. Soft tissues are unremarkable. IMPRESSION: No acute abnormality seen in the left hand. Electronically Signed   By: Marijo Conception, M.D.   On: 07/25/2018 18:59    Pending Labs Unresulted Labs (From admission, onward)    Start     Ordered   07/26/18 4373  Basic metabolic panel  Tomorrow morning,   R     07/25/18 2214   07/26/18 0500  CBC  Tomorrow morning,   R     07/25/18 2214   07/25/18 2212  Sedimentation rate  Once,   R     07/25/18 2212   07/25/18 2212  C-reactive protein  Once,   R     07/25/18 2212    07/25/18 2212  Brain natriuretic peptide  Once,   R     07/25/18 2212   07/25/18 1748  Urine culture  STAT,   STAT     07/25/18 1748   07/25/18 1747  Blood culture (routine x 2)  BLOOD CULTURE X 2,   STAT     07/25/18 1748          Vitals/Pain Today's Vitals   07/25/18 2300 07/25/18 2320 07/26/18 0000 07/26/18 0004  BP: 116/70 116/70 (!) 109/54   Pulse:  (!) 116    Resp:  18    Temp:      TempSrc:      SpO2:  97%  98%  PainSc:        Isolation Precautions No active isolations  Medications Medications  atorvastatin (LIPITOR) tablet 80 mg (has no administration in time range)  isosorbide mononitrate (IMDUR) 24 hr tablet 30 mg (has no administration in time range)  nitroGLYCERIN (NITROSTAT) SL tablet 0.4 mg (has no administration in time range)  rivaroxaban (XARELTO) tablet 20 mg (has no administration in time range)  0.9 %  sodium chloride infusion ( Intravenous New Bag/Given 07/25/18 2241)  cefTRIAXone (ROCEPHIN) 2 g in sodium chloride 0.9 % 100 mL IVPB ( Intravenous Stopped  07/25/18 2309)  oxyCODONE-acetaminophen (PERCOCET/ROXICET) 5-325 MG per tablet 1 tablet (has no administration in time range)  nicotine (NICODERM CQ - dosed in mg/24 hours) patch 21 mg (has no administration in time range)  acetaminophen (TYLENOL) tablet 650 mg (has no administration in time range)    Or  acetaminophen (TYLENOL) suppository 650 mg (has no administration in time range)  senna-docusate (Senokot-S) tablet 1 tablet (has no administration in time range)  ondansetron (ZOFRAN) tablet 4 mg (has no administration in time range)    Or  ondansetron (ZOFRAN) injection 4 mg (has no administration in time range)  hydrALAZINE (APRESOLINE) injection 5 mg (has no administration in time range)  zolpidem (AMBIEN) tablet 5 mg (has no administration in time range)  vancomycin (VANCOCIN) 1,500 mg in sodium chloride 0.9 % 500 mL IVPB (1,500 mg Intravenous New Bag/Given 07/25/18 2356)  sodium chloride 0.9 %  bolus 500 mL ( Intravenous Stopped 07/25/18 2329)  clindamycin (CLEOCIN) IVPB 600 mg (0 mg Intravenous Stopped 07/25/18 2227)  sodium chloride 0.9 % bolus 1,000 mL (0 mLs Intravenous Stopped 07/25/18 2348)    Mobility walks with device

## 2018-07-26 NOTE — Progress Notes (Signed)
Christopher Wilson is a 77 y.o. male patient admitted from ED awake, alert - oriented  X 4 - no acute distress noted.  VSS - Blood pressure (!) 155/88, pulse 100, temperature 98.2 F (36.8 C), temperature source Oral, resp. rate 18, height 5\' 10"  (1.778 m), weight 73.4 kg, SpO2 97 %.    IV in place, occlusive dsg intact without redness.  Orientation to room, and floor completed.   Admission INP armband ID verified with patient/family, and in place.   SR up x 2, fall assessment complete, with patient and family able to verbalize understanding of risk associated with falls, and verbalized understanding to call nsg before up out of bed.  Call light within reach, patient able to voice, and demonstrate understanding.  Skin, clean-dry- intact without evidence of bruising, or skin tears with the exception of cellulitis to left arm. No evidence of skin break down noted on exam.   Will cont to eval and treat per MD orders.  Darnelle Catalan, RN 07/26/2018 2:37 AM

## 2018-07-26 NOTE — Progress Notes (Signed)
Pharmacy Antibiotic Note  Christopher Wilson is a 77 y.o. male admitted on 07/25/2018 with cellulitis.  Pharmacy has been consulted for Vancomycin dosing.  Plan: Vancomycin 1500mg  iv x1, then 1250mg  iv q36hr  Goal AUC = 400 - 500 for all indications, except meningitis (goal AUC > 500 and Cmin 15-20 mcg/mL)   Height: 5\' 10"  (177.8 cm) Weight: 161 lb 13.1 oz (73.4 kg) IBW/kg (Calculated) : 73  Temp (24hrs), Avg:98.4 F (36.9 C), Min:98.2 F (36.8 C), Max:98.8 F (37.1 C)  Recent Labs  Lab 07/22/18 1149 07/24/18 1123 07/25/18 1804 07/25/18 1810 07/26/18 0434  WBC 22.7* 26.2* 33.7*  --  26.6*  CREATININE 1.80* 2.13* 1.85*  --  1.65*  LATICACIDVEN  --   --   --  1.70  --     Estimated Creatinine Clearance: 39.3 mL/min (A) (by C-G formula based on SCr of 1.65 mg/dL (H)).    No Known Allergies  Antimicrobials this admission: Vancomycin 07/25/2018 >> Ceftriaxone 07/25/2018 >>   Dose adjustments this admission: -  Microbiology results: -  Thank you for allowing pharmacy to be a part of this patient's care.  Nani Skillern Crowford 07/26/2018 6:11 AM

## 2018-07-27 LAB — CBC WITH DIFFERENTIAL/PLATELET
BASOS PCT: 0 %
Basophils Absolute: 0 10*3/uL (ref 0.0–0.1)
EOS ABS: 0.1 10*3/uL (ref 0.0–0.7)
EOS PCT: 0 %
HCT: 20.7 % — ABNORMAL LOW (ref 39.0–52.0)
Hemoglobin: 6.6 g/dL — CL (ref 13.0–17.0)
Lymphocytes Relative: 6 %
Lymphs Abs: 1.1 10*3/uL (ref 0.7–4.0)
MCH: 31.6 pg (ref 26.0–34.0)
MCHC: 31.9 g/dL (ref 30.0–36.0)
MCV: 99 fL (ref 78.0–100.0)
MONO ABS: 1.7 10*3/uL — AB (ref 0.1–1.0)
Monocytes Relative: 8 %
Neutro Abs: 16.7 10*3/uL — ABNORMAL HIGH (ref 1.7–7.7)
Neutrophils Relative %: 86 %
PLATELETS: 80 10*3/uL — AB (ref 150–400)
RBC: 2.09 MIL/uL — ABNORMAL LOW (ref 4.22–5.81)
RDW: 19.1 % — AB (ref 11.5–15.5)
WBC: 19.5 10*3/uL — ABNORMAL HIGH (ref 4.0–10.5)

## 2018-07-27 LAB — COMPREHENSIVE METABOLIC PANEL
ALBUMIN: 2.2 g/dL — AB (ref 3.5–5.0)
ALT: 7 U/L (ref 0–44)
ANION GAP: 5 (ref 5–15)
AST: 10 U/L — ABNORMAL LOW (ref 15–41)
Alkaline Phosphatase: 85 U/L (ref 38–126)
BUN: 18 mg/dL (ref 8–23)
CO2: 23 mmol/L (ref 22–32)
Calcium: 8.4 mg/dL — ABNORMAL LOW (ref 8.9–10.3)
Chloride: 113 mmol/L — ABNORMAL HIGH (ref 98–111)
Creatinine, Ser: 1.59 mg/dL — ABNORMAL HIGH (ref 0.61–1.24)
GFR calc Af Amer: 47 mL/min — ABNORMAL LOW (ref >60)
GFR calc non Af Amer: 41 mL/min — ABNORMAL LOW (ref >60)
GLUCOSE: 103 mg/dL — AB (ref 70–99)
Potassium: 3.8 mmol/L (ref 3.5–5.1)
SODIUM: 141 mmol/L (ref 135–145)
TOTAL PROTEIN: 5.1 g/dL — AB (ref 6.5–8.1)
Total Bilirubin: 0.5 mg/dL (ref 0.3–1.2)

## 2018-07-27 LAB — ABO/RH: ABO/RH(D): A POS

## 2018-07-27 LAB — MAGNESIUM: MAGNESIUM: 1.8 mg/dL (ref 1.7–2.4)

## 2018-07-27 LAB — PHOSPHORUS: PHOSPHORUS: 2.5 mg/dL (ref 2.5–4.6)

## 2018-07-27 LAB — URINE CULTURE: Culture: <10000 — AB

## 2018-07-27 MED ORDER — LEVALBUTEROL HCL 0.63 MG/3ML IN NEBU
0.6300 mg | INHALATION_SOLUTION | Freq: Three times a day (TID) | RESPIRATORY_TRACT | Status: DC
  Administered 2018-07-28 – 2018-07-31 (×9): 0.63 mg via RESPIRATORY_TRACT
  Filled 2018-07-27 (×10): qty 3

## 2018-07-27 MED ORDER — HEPARIN (PORCINE) IN NACL 100-0.45 UNIT/ML-% IJ SOLN
1000.0000 [IU]/h | INTRAMUSCULAR | Status: DC
  Administered 2018-07-27: 1000 [IU]/h via INTRAVENOUS
  Filled 2018-07-27 (×2): qty 250

## 2018-07-27 MED ORDER — SODIUM CHLORIDE 0.9% IV SOLUTION
Freq: Once | INTRAVENOUS | Status: AC
  Administered 2018-07-27: 11:00:00 via INTRAVENOUS

## 2018-07-27 MED ORDER — IPRATROPIUM BROMIDE 0.02 % IN SOLN
0.5000 mg | Freq: Three times a day (TID) | RESPIRATORY_TRACT | Status: DC
  Administered 2018-07-28 – 2018-07-31 (×9): 0.5 mg via RESPIRATORY_TRACT
  Filled 2018-07-27 (×10): qty 2.5

## 2018-07-27 NOTE — Progress Notes (Signed)
PROGRESS NOTE    Christopher Wilson  YPP:[id number] DOB: Sep 21, 1941 DOA: 07/25/2018 PCP: Patient, No Pcp Per   Brief Narrative:  HPI per Dr. Ivor Costa on 07/25/18 Christopher Wilson is a 77 y.o. 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  Patient states that he has been having swelling and pain in left arm for more than 3 weeks .He was treated with one course of doxycycline without complete recovery, then started Augmentin on 9/27, still no significant improvement.  His pain is constant, 6 out of 10 severity, sharp, nonradiating.  Left arm is also erythematous.  He states that the infection started after he had IV infiltration initially. He developed fever of 102.7 yesterday with chills. Pt had UE venous doppler which was negative for DVT on 07/24/18.  Patient does not have chest pain, shortness breath, cough.  No nausea, vomiting, diarrhea or abdominal pain.  No symptoms of UTI or unilateral weakness.  **Feels ok. Was somewhat SOB today. Right Arm still swollen thinks it is improving and is not as painful.  Overnight blood count dropped to 6.6 please transfuse 1 unit of PRBCs.  His Xarelto will be held and will be transitioned to a heparin drip given his renal function will watch for continued signs and symptoms of bleeding  Assessment & Plan:   Principal Problem:   Cellulitis of left arm Active Problems:   CAD (coronary artery disease), with CABG 1989 and re-do Cabg 2002 X 3 vesssels.  progressive disease with Stent to Lt. 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  Blood pressure is soft, but currently hemodynamically stable.  Lactic acid is normal. -Patient failed outpatient oral antibiotics treatment.  Patient is immunosuppressed, will need antibiotics with broad coverage. -This physiology is improved -Admitted to Med-Surge - Empiric antimicrobial treatment with vancomycin and Rocephin (patient received 1 dose of clindamycin in ED) and will continue -PRN Zofran for nausea, and Percocet for pain - Blood cultures x 2 showed NGTD 2 Days  - ESR was 77 and CRP was 12.9 - will get Procalcitonin and trend lactic acid levels per sepsis protocol. -Given IVF: 1.5 L of NS bolus in ED, followed by 100 cc/hr but reduced to 75 mL now -Repeat CBC in AM; WBC is improving went from 33.7 -> now 26.6 -> 19.5  Wheezing and SOB -Reduce IVF Rate -CXR showed that th Right Midlung Mass is Stable. Pleural Thickening vs. Tiny Right Effusion which is unchanged -Started Nebs with Levalbuterol 0.63 mg q6h and Ipratropium 0.5 mg Neb q6h -Saturating well -Continue to Monitor Respiratory Status   CAD (coronary artery disease), with CABG 1989 and re-do Cabg 2002 X 3 vesssels.  progressive disease with Stent to Lt. Main, and 1st diag 2006, stent to VG to LCX 2007, with known occlusion history: No CP. -Continue Atorvastatin  80 mg po qHS, Isosorbide Mononitrate 30 mg po Daily, PRN Nitroglycerin 0.4 mg sL q61mn PRN  CKD (chronic kidney disease) stage 3, GFR 30-59 ml/min (HCC) -Stable.  Baseline creatinine 1.5-1.8.  His creatinine is 1.85, BUN 21 on admission and repeat BUN/Cr was 18/1.59 this AM -Avoid Nephrotoxic Medications -Continue to Monitor and repeat CMP in AM   Hyperlipidemia with target LDL less than 70: -C/w Atorvastatin 80  mg po qHS  Tobacco Abuse: -Smoking Cessation Counseling given -Nicotine Patch 21 mg TD Patch   Essential Hypertension: -Held Lasix and Metoprolol due to sepsis and high risk of developing hypotension and will likley resume in AM -IV hydralazine 5  Mg IV q2hprn for SBP>180  Small cell carcinoma of upper lobe of right lung (HCC) -Patient is on chemotherapy every 3 weeks, follow-up with Dr. MJulien Nordmann-Follow-up with Dr. MJulien Nordmann-[place] Dr. [place]Jul[place] to [place] in AM   DVT, Lower Extremity; Left Leg -Continued Home Xarelto but Blood Count dropped so will hold and start Heparin gtt after Blood Transfusion   Hx of Pancytopenia but currently Anemic and Thrombocytopenic -Most likely due to Chemotherapy but WBC Elevated -WBC was 26.6, Hb/Hct was 7.5/23.4, and Platelet Count was 70 yesterday AM -Now WBC was 19.5, Hb/Hct was 6.6/20.7, and Platelet Count was 80 this AM -Type and screen and transfuse 1 unit PRBCs -Patient needs anticoagulation due to his recent leg DVT so will hold his home Xarelto and start heparin drip after blood transfusion and continue monitor for signs and symptoms of bleeding very carefully -Follow-up with CBC in a.m.  DVT prophylaxis: Anticoagulated with Xarelto is now held Code Status: FULL CODE Family Communication: No family present at bedside  Disposition Plan: Remain Inpatient for continued Workup and Treatment   Consultants:   None  Procedures:  None   Antimicrobials:  Anti-infectives (From admission, onward)   Start     Dose/Rate Route Frequency Ordered Stop   07/27/18 1000  vancomycin (VANCOCIN) 1,250 mg in sodium chloride 0.9 % 250 mL IVPB     1,250 mg 166.7 mL/hr over 90 Minutes Intravenous Every 36 hours 07/26/18 0611     07/25/18 2230  vancomycin (VANCOCIN) 1,500 mg in sodium chloride 0.9 % 500 mL IVPB     1,500 mg 250 mL/hr over 120 Minutes Intravenous  Once 07/25/18 2220 07/26/18 0156    07/25/18 2215  cefTRIAXone (ROCEPHIN) 2 g in sodium chloride 0.9 % 100 mL IVPB     2 g 200 mL/hr over 30 Minutes Intravenous Daily at bedtime 07/25/18 2211     07/25/18 2115  clindamycin (CLEOCIN) IVPB 600 mg     600 mg 100 mL/hr over 30 Minutes Intravenous  Once 07/25/18 2103 07/25/18 2227     Subjective: Seen and examined at bedside was doing better he thought.  States his arm was not as swollen and not as painful.  No chest pain, lightheadedness or dizziness.  Understands that his blood count dropped and he did blood and is agreeable.  No other concerns or quads at this time.  Objective: Vitals:   07/27/18 0749 07/27/18 1242 07/27/18 1313 07/27/18 1603  BP:  116/68 99/61 133/84  Pulse:  (!) 110 (!) 110 (!) 106  Resp:  _0 Temp:  97.9 F (36.6 C) 98.4 F (36.9 C) 98.2 F (36.8 C)  TempSrc:  Oral Oral Oral  SpO2: 92% 99% 96% 95%  Weight:      Height:  Intake/Output Summary (Last 24 hours) at 07/27/2018 1737 Last data filed at 07/27/2018 1646 Gross per 24 hour  Intake 2488.24 ml  Output 1350 ml  Net 1138.24 ml   Filed Weights   07/26/18 0206  Weight: 73.4 kg   Examination: Physical Exam:  Constitutional: Well-nourished, well-developed Caucasian male currently no acute distress.  Appears calm and comfortable sitting up at the edge of the bed Eyes: Sclera anicteric.  Lids and conjunctive are normal ENMT: External ears and nose appear normal.  Mucous members are moist Neck: Appears supple with no JVD Respiratory: Diminished to auscultation bilaterally no appreciable wheezing, rales, rhonchi.  Patient not using accessory muscles to breathe and had unlabored breathing Cardiovascular: Regular rate and rhythm.  No appreciable murmurs, rubs, gallops Abdomen: Soft, nontender, distended.  No masses appreciated.  Bowel sounds present in 4 quadrant GU: Deferred Musculoskeletal: No contractures or cyanosis. Skin: Forearm is swollen but not as much as yesterday.   Erythema is improving and is still slightly painful to palpate but not as bad yesterday.  No induration.  Rest of skin is warm and dry Neurologic: Cranial nerves II through XII grossly intact no appreciable focal deficits Psychiatric: Normal mood and affect.  Intact and insight.  Patient is awake, alert   Data Reviewed: I have personally reviewed following labs and imaging studies  CBC: Recent Labs  Lab 07/22/18 1149 07/24/18 1123 07/25/18 1804 07/26/18 0434 07/27/18 0502  WBC 22.7* 26.2* 33.7* 26.6* 19.5*  NEUTROABS 21.3* 22.7* 27.7*  --  16.7*  HGB 8.6* 9.2* 8.3* 7.5* 6.6*  HCT 27.0* 29.1* 26.4* 23.4* 20.7*  MCV 95.7 96.3 98.9 99.6 99.0  PLT 38* 67* 75* 70* 80*   Basic Metabolic Panel: Recent Labs  Lab 07/22/18 1149 07/24/18 1123 07/25/18 1804 07/26/18 0434 07/27/18 0502  NA 142 145 142 141 141  K 4.2 4.2 4.0 4.0 3.8  CL 108 110 108 112* 113*  CO2 _0 GLUCOSE 107* 94 125* 117* 103*  BUN _1 CREATININE 1.80* 2.13* 1.85* 1.65* 1.59*  CALCIUM 9.3 9.5 9.0 8.4* 8.4*  MG  --   --   --   --  1.8  PHOS  --   --   --   --  2.5   GFR: Estimated Creatinine Clearance: 40.8 mL/min (A) (by C-G formula based on SCr of 1.59 mg/dL (H)). Liver Function Tests: Recent Labs  Lab 07/22/18 1149 07/24/18 1123 07/25/18 1804 07/27/18 0502  AST 8* 10* 14* 10*  ALT _2 ALKPHOS 143* 164* 120 85  BILITOT 0.2* 0.3 0.7 0.5  PROT 6.4* 7.1 6.2* 5.1*  ALBUMIN 3.0* 3.2* 3.0* 2.2*   No results for input(s): LIPASE, AMYLASE in the last 168 hours. No results for input(s): AMMONIA in the last 168 hours. Coagulation Profile: No results for input(s): INR, PROTIME in the last 168 hours. Cardiac Enzymes: No results for input(s): CKTOTAL, CKMB, CKMBINDEX, TROPONINI in the last 168 hours. BNP (last 3 results) No results for input(s): PROBNP in the last 8760 hours. HbA1C: No results for input(s): HGBA1C in the last 72 hours. CBG: No results for input(s): GLUCAP in  the last 168 hours. Lipid Profile: No results for input(s): CHOL, HDL, LDLCALC, TRIG, CHOLHDL, LDLDIRECT in the last 72 hours. Thyroid Function Tests: No results for input(s): TSH, T4TOTAL, FREET4, T3FREE, THYROIDAB in the last 72 hours. Anemia Panel: No results for input(s): VITAMINB12, FOLATE, FERRITIN, TIBC, IRON, RETICCTPCT in the  last 72 hours. Sepsis Labs: Recent Labs  Lab 07/25/18 1810  LATICACIDVEN 1.70    Recent Results (from the past 240 hour(s))  Blood culture (routine x 2)     Status: None (Preliminary result)   Collection Time: 07/25/18  6:05 PM  Result Value Ref Range Status   Specimen Description SITE NOT SPECIFIED  Final   Special Requests   Final    BOTTLES DRAWN AEROBIC AND ANAEROBIC Blood Culture results may not be optimal due to an excessive volume of blood received in culture bottles Performed at [facility], [place] [address], [place], [place] [address]    Culture   Final    NO GROWTH 2 DAYS Performed at [facility] [address], [place], [place] [address]    Report Status PENDING  Incomplete  Blood culture (routine x 2)     Status: None (Preliminary result)   Collection Time: 07/25/18  6:17 PM  Result Value Ref Range Status   Specimen Description   Final    BLOOD RIGHT HAND Performed at [facility] [address], [place], [place] [address]    Special Requests   Final    BOTTLES DRAWN AEROBIC AND ANAEROBIC Blood Culture adequate volume Performed at [facility] [address], [place], [place] [address]    Culture   Final    NO GROWTH 2 DAYS Performed at [facility] [address], [place], [place] [address]    Report Status PENDING  Incomplete  Urine culture     Status: Abnormal   Collection Time: 07/25/18  7:40 PM  Result Value Ref Range Status   Specimen Description   Final    URINE, CLEAN CATCH Performed at [facility], [place] [address],  [place], [place] [address]    Special Requests   Final    NONE Performed at [facility], [place] [address], [place], [place] [address]    Culture (A)  Final    <10,000 COLONIES/mL INSIGNIFICANT GROWTH Performed at [facility] [address] [place],  [address]    Report Status 07/27/2018 FINAL  Final    Radiology Studies: Dg Chest 2 View  Result Date: 07/25/2018 CLINICAL DATA:  Fever. EXAM: CHEST - 2 VIEW COMPARISON:  Radiographs of June 19, 2018. FINDINGS: Stable cardiomediastinal silhouette. Status post coronary artery bypass graft. No pneumothorax is noted. Left lung is clear. Stable right midlung mass is noted consistent with malignancy. Stable right lower lobe scarring is noted. Bony thorax is unremarkable. IMPRESSION: No significant change compared to prior exam. Electronically Signed   By: Marijo Conception, M.D.   On: 07/25/2018 19:01   Dg Forearm Left  Result Date: 07/25/2018 CLINICAL DATA:  Left hand and arm swelling. EXAM: LEFT FOREARM - 2 VIEW COMPARISON:  None. FINDINGS: There is no evidence of fracture or other focal bone lesions. Soft tissues are unremarkable. IMPRESSION: Normal left forearm. Electronically Signed   By: Marijo Conception, M.D.   On: 07/25/2018 18:58   Dg Hand 2 View Left  Result Date: 07/25/2018 CLINICAL DATA:  Acute left hand pain and swelling. EXAM: LEFT HAND - 2 VIEW COMPARISON:  None. FINDINGS: There is no evidence of fracture or dislocation. Narrowing of radiocarpal joint is noted consistent with degenerative joint disease. No lytic destruction is seen to suggest osteomyelitis. Soft tissues are unremarkable. IMPRESSION: No acute abnormality seen in the left hand. Electronically Signed  By: Marijo Conception, M.D.   On: 07/25/2018 18:59   Dg Chest Port 1 View  Result Date: 07/26/2018 CLINICAL DATA:  Shortness of breath.  History of lung cancer. EXAM: PORTABLE CHEST 1 VIEW COMPARISON:  July 25, 2018 FINDINGS: No pneumothorax.  The known mass in the right mid lung is stable. Pleural thickening versus a small right pleural effusion. No new infiltrate. The cardiomediastinal silhouette is stable. IMPRESSION: 1. The known right midlung mass is stable. Pleural thickening versus tiny right effusion, also unchanged. No interval changes. Electronically Signed   By: Dorise Bullion III M.D   On: 07/26/2018 17:46   Scheduled Meds: . atorvastatin  80 mg Oral q1800  . ipratropium  0.5 mg Nebulization Q6H  . isosorbide mononitrate  30 mg Oral Daily  . levalbuterol  0.63 mg Nebulization Q6H  . nicotine  21 mg Transdermal Daily   Continuous Infusions: . sodium chloride 75 mL/hr at 07/27/18 1646  . cefTRIAXone (ROCEPHIN)  IV 2 g (07/26/18 2157)  . heparin    . vancomycin Stopped (07/27/18 1206)    LOS: 1 day   Kerney Elbe, DO [facility] PAGER is on [place]  If 7PM-7AM, please contact night-coverage [email or web address] Password [facility] 07/27/2018, 5:37 PM

## 2018-07-27 NOTE — Progress Notes (Addendum)
CRITICAL VALUE ALERT  Critical Value:  Hgb 6.6  Date & Time Notied:  07/27/18 at 6:10 am  Provider Notified: X. Blount, NP  Orders Received/Actions taken: Type & screen ordered

## 2018-07-27 NOTE — Progress Notes (Signed)
Christopher Wilson for Heparin Indication: Hx VTE on Xarelto  No Known Allergies  Patient Measurements: Height: 5\' 10"  (177.8 cm) Weight: 161 lb 13.1 oz (73.4 kg) IBW/kg (Calculated) : 73 Heparin Dosing Weight: 73.4 kg  Vital Signs: Temp: 98 F (36.7 C) (09/29 0427) Temp Source: Oral (09/29 0427) BP: 110/63 (09/29 0427) Pulse Rate: 100 (09/29 0427)  Labs: Recent Labs    07/25/18 1804 07/26/18 0434 07/27/18 0502  HGB 8.3* 7.5* 6.6*  HCT 26.4* 23.4* 20.7*  PLT 75* 70* 80*  CREATININE 1.85* 1.65* 1.59*   Estimated Creatinine Clearance: 40.8 mL/min (A) (by C-G formula based on SCr of 1.59 mg/dL (H)).  Medical History: Past Medical History:  Diagnosis Date  . ACS (acute coronary syndrome), with ST elevation but stable coronary arteries on cath 06/27/12 06/27/2012  . CAD (coronary artery disease), with CABG 1989 and re-do Cabg 2002 X 3 vesssels.  progressive disease with Stent to Lt. Main, and 1st diag 2006, stent to VG to LCX 2007, with known occlusion history o  06/27/2012  . CKD (chronic kidney disease) stage 3, GFR 30-59 ml/min (HCC) 06/27/2012  . 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 Coronary artery disease   . GERD (gastroesophageal reflux disease)   . Hyperlipidemia LDL goal < 70 06/27/2012  . Hypertension   . Myocardial infarction ([place])   . Peptic ulcer   . STEMI (ST elevation myocardial infarction) ([place]) 06/27/2012  . Tobacco abuse 06/27/2012   Medications:  Scheduled:  . atorvastatin  80 mg Oral q1800  . ipratropium  0.5 mg Nebulization Q6H  . isosorbide mononitrate  30 mg Oral Daily  . levalbuterol  0.63 mg Nebulization Q6H  . nicotine  21 mg Transdermal Daily   Infusions:  . sodium chloride 75 mL/hr at 07/27/18 0546  . cefTRIAXone (ROCEPHIN)  IV 2 g (07/26/18 2157)  . heparin    . vancomycin 1,250 mg (07/27/18 1036)   Assessment: 80 yoM with 3 wk hx LUE pain/swelling - outpt Doxycycline followed by Augmentin  PMH:  SCLC/chemo, HTN, HPL, GERD< CAD, CABG/stent, CKD3, solitary kidney (congenital), hx PUD Hx DVT of LLE on 7/9 treated with Xarelto  Today, 07/27/2018  Discontinue Xarelto, renal fx not improving, H/H & Plt decreasing  Switch to Heparin infusion, Xarelto will artificially increase Heparin levels   Goal of Therapy:  Heparin level 0.3-0.7 units/ml aPTT 66-102 seconds Monitor platelets by anticoagulation protocol: Yes   Plan:   Heparin infusion, begin at 1000 units/hr, no bolus, at 1800 today 9/29  Hep level and aPTT at 0200  Adjust Heparin infusion based on aPTTs until Heparin levels correlate with aPTT  Minda Ditto PharmD Pager [phone] 07/27/2018, 2:42 PM

## 2018-07-28 ENCOUNTER — Inpatient Hospital Stay (HOSPITAL_COMMUNITY): Payer: Medicare HMO

## 2018-07-28 ENCOUNTER — Encounter: Payer: Self-pay | Admitting: Cardiovascular Disease

## 2018-07-28 ENCOUNTER — Telehealth: Payer: Self-pay | Admitting: Medical Oncology

## 2018-07-28 LAB — CBC WITH DIFFERENTIAL/PLATELET
Basophils Absolute: 0.3 10*3/uL — ABNORMAL HIGH (ref 0.0–0.1)
Basophils Relative: 1 %
Eosinophils Absolute: 0.3 10*3/uL (ref 0.0–0.7)
Eosinophils Relative: 1 %
HCT: 11 % — ABNORMAL LOW (ref 39.0–52.0)
Hemoglobin: 3.5 g/dL — CL (ref 13.0–17.0)
LYMPHS ABS: 2.1 10*3/uL (ref 0.7–4.0)
Lymphocytes Relative: 8 %
MCH: 31 pg (ref 26.0–34.0)
MCHC: 31.8 g/dL (ref 30.0–36.0)
MCV: 97.3 fL (ref 78.0–100.0)
MONO ABS: 0.5 10*3/uL (ref 0.1–1.0)
Metamyelocytes Relative: 1 %
Monocytes Relative: 2 %
Myelocytes: 1 %
NEUTROS ABS: 23.5 10*3/uL — AB (ref 1.7–7.7)
Neutrophils Relative %: 86 %
PLATELETS: 138 10*3/uL — AB (ref 150–400)
RBC: 1.13 MIL/uL — AB (ref 4.22–5.81)
RDW: 19.3 % — AB (ref 11.5–15.5)
WBC: 26.7 10*3/uL — AB (ref 4.0–10.5)

## 2018-07-28 LAB — COMPREHENSIVE METABOLIC PANEL
ALT: 13 U/L (ref 0–44)
AST: 16 U/L (ref 15–41)
Albumin: 2.6 g/dL — ABNORMAL LOW (ref 3.5–5.0)
Alkaline Phosphatase: 93 U/L (ref 38–126)
Anion gap: 8 (ref 5–15)
BUN: 14 mg/dL (ref 8–23)
CHLORIDE: 111 mmol/L (ref 98–111)
CO2: 21 mmol/L — ABNORMAL LOW (ref 22–32)
Calcium: 8.6 mg/dL — ABNORMAL LOW (ref 8.9–10.3)
Creatinine, Ser: 1.44 mg/dL — ABNORMAL HIGH (ref 0.61–1.24)
GFR, EST AFRICAN AMERICAN: 53 mL/min — AB (ref >60)
GFR, EST NON AFRICAN AMERICAN: 46 mL/min — AB (ref >60)
Glucose, Bld: 126 mg/dL — ABNORMAL HIGH (ref 70–99)
Potassium: 4.1 mmol/L (ref 3.5–5.1)
Sodium: 140 mmol/L (ref 135–145)
TOTAL PROTEIN: 5.9 g/dL — AB (ref 6.5–8.1)
Total Bilirubin: 0.7 mg/dL (ref 0.3–1.2)

## 2018-07-28 LAB — APTT
APTT: 54 s — AB (ref 24–36)
APTT: 58 s — AB (ref 24–36)
aPTT: 55 seconds — ABNORMAL HIGH (ref 24–36)

## 2018-07-28 LAB — MAGNESIUM: MAGNESIUM: 1.8 mg/dL (ref 1.7–2.4)

## 2018-07-28 LAB — HEMOGLOBIN AND HEMATOCRIT, BLOOD
HEMATOCRIT: 22.2 % — AB (ref 39.0–52.0)
HEMOGLOBIN: 7.3 g/dL — AB (ref 13.0–17.0)

## 2018-07-28 LAB — PREPARE RBC (CROSSMATCH)

## 2018-07-28 LAB — HEPARIN LEVEL (UNFRACTIONATED)
Heparin Unfractionated: 1.32 IU/mL — ABNORMAL HIGH (ref 0.30–0.70)
Heparin Unfractionated: 0.47 IU/mL (ref 0.30–0.70)

## 2018-07-28 LAB — PHOSPHORUS: PHOSPHORUS: 2.2 mg/dL — AB (ref 2.5–4.6)

## 2018-07-28 MED ORDER — HEPARIN (PORCINE) IN NACL 100-0.45 UNIT/ML-% IJ SOLN
1100.0000 [IU]/h | INTRAMUSCULAR | Status: DC
  Administered 2018-07-28: 1100 [IU]/h via INTRAVENOUS
  Filled 2018-07-28 (×2): qty 250

## 2018-07-28 MED ORDER — GADOBUTROL 1 MMOL/ML IV SOLN: 7.5000 mL | Freq: Once | INTRAVENOUS | Status: DC | PRN

## 2018-07-28 MED ORDER — METOPROLOL TARTRATE 25 MG PO TABS
25.0000 mg | ORAL_TABLET | Freq: Two times a day (BID) | ORAL | Status: DC
  Administered 2018-07-28 – 2018-07-31 (×6): 25 mg via ORAL
  Filled 2018-07-28 (×6): qty 1

## 2018-07-28 MED ORDER — POTASSIUM PHOSPHATES 15 MMOLE/5ML IV SOLN
20.0000 mmol | Freq: Once | INTRAVENOUS | Status: AC
  Administered 2018-07-28: 20 mmol via INTRAVENOUS
  Filled 2018-07-28: qty 6.67

## 2018-07-28 MED ORDER — LORAZEPAM 2 MG/ML IJ SOLN
0.5000 mg | Freq: Once | INTRAMUSCULAR | Status: AC
  Administered 2018-07-28: 0.5 mg via INTRAVENOUS
  Filled 2018-07-28: qty 1

## 2018-07-28 MED ORDER — SODIUM CHLORIDE 0.9% IV SOLUTION
Freq: Once | INTRAVENOUS | Status: AC
  Administered 2018-07-28: 12:00:00 via INTRAVENOUS

## 2018-07-28 NOTE — Progress Notes (Addendum)
CRITICAL VALUE ALERT  Critical Value:  HGB 3.4  Date & Time Notied:07/28/18 2:55 AM   Provider Notified: X. Blount, NP  Orders Received/Actions taken:STAT  Hgb & Hct ordered

## 2018-07-28 NOTE — Progress Notes (Signed)
Initial Nutrition Assessment  INTERVENTION:   Encouraged patient to request Ensure supplements from unit if appetite is poor  NUTRITION DIAGNOSIS:   Increased nutrient needs related to cancer and cancer related treatments as evidenced by estimated needs.  GOAL:   Patient will meet greater than or equal to 90% of their needs  MONITOR:   PO intake, Labs, Weight trends, Skin, I & O's  REASON FOR ASSESSMENT:   Malnutrition Screening Tool    ASSESSMENT:    77 y.o. 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  Patient reports having a good appetite and was eating well prior to developing cellulitis in his arm. States this began following a chemo treatment ~ 3 weeks ago. Patient was drinking protein supplements but states he started to get tired of them. Pt does not want them ordered but RD did suggest that he request them from unit if his appetite decreases this admission. Pt ate 100% of his breakfast this morning.   Per patient UBW is 183 lb. Last weighed this 1 year ago. Pt has lost 22 lb since September 2018 (12% wt loss x 1 year, insignificant for time frame).  Medications: K-Phos Labs reviewed: Low Phos Mg WNL GFR: 46   NUTRITION - FOCUSED PHYSICAL EXAM:    Most Recent Value  Orbital Region  No depletion  Upper Arm Region  No depletion  Thoracic and Lumbar Region  Unable to assess  Buccal Region  No depletion  Temple Region  No depletion  Clavicle Bone Region  Mild depletion  Clavicle and Acromion Bone Region  Mild depletion  Scapular Bone Region  Unable to assess  Dorsal Hand  No depletion  Patellar Region  No depletion  Anterior Thigh Region  No depletion  Posterior Calf Region  No depletion  Edema (RD Assessment)  None        Diet Order:   Diet Order            Diet Heart Room service appropriate? Yes; Fluid consistency: Thin  Diet effective now              EDUCATION NEEDS:   Education needs have been addressed  Skin:  Skin Assessment: Reviewed RN Assessment  Last BM:  9/28  Height:   Ht Readings from Last 1 Encounters:  07/26/18 5\' 10"  (1.778 m)    Weight:   Wt Readings from Last 1 Encounters:  07/26/18 73.4 kg    Ideal Body Weight:  75.5 kg  BMI:  Body mass index is 23.22 kg/m.  Estimated Nutritional Needs:   Kcal:  2200-2400  Protein:  110-120g  Fluid:  2.2L/day   Clayton Bibles, MS, RD, LDN [place] Dietitian Pager: [phone] After Hours Pager: [phone]

## 2018-07-28 NOTE — Progress Notes (Signed)
Patient is refusing MRI, said he doesn't think it is necessary. Explained to the patient the reasoning for the MRI. Patient hesitant about procedure. Suggested Ativan for relaxation, patient agreed.

## 2018-07-28 NOTE — Progress Notes (Signed)
PT returned from MRI at this time. MRI tech states that pt was not able to complete MRI because he complained of pain from raising arm up.Also, was unable to receive the contrast due to same. MRI done without contrast. Results pending.

## 2018-07-28 NOTE — Progress Notes (Addendum)
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  No Known Allergies  Patient Measurements: Height: 5\' 10"  (177.8 cm) Weight: 161 lb 13.1 oz (73.4 kg) IBW/kg (Calculated) : 73 Heparin Dosing Weight: 73 kg  Vital Signs: Temp: 98 F (36.7 C) (09/30 1452) Temp Source: Oral (09/30 1452) BP: 140/93 (09/30 1452) Pulse Rate: 117 (09/30 1452)  Labs: Recent Labs    07/26/18 0434 07/27/18 0502 07/28/18 0219 07/28/18 0352 07/28/18 1409  HGB 7.5* 6.6* 3.5* 7.3*  --   HCT 23.4* 20.7* 11.0* 22.2*  --   PLT 70* 80* 138*  --   --   APTT  --   --  54*  --  58*  HEPARINUNFRC  --   --  1.32*  --   --   CREATININE 1.65* 1.59* 1.44*  --   --     Estimated Creatinine Clearance: 45.1 mL/min (A) (by C-G formula based on SCr of 1.44 mg/dL (H)).  Assessment: Monitoring/adjusting using PTT  Until it correlates with HL due to possible drug-lab interaction between oral anticoagulant (rivaroxaban, edoxaban, or apixaban) and anti-Xa level (aka heparin level)   Today, 07/28/18  Hgb 7.3 (initial lab draw of Hgb 3.5 this morning was suspected error. STAT redraw Hgb = 7.3)  Plt 138  Pt has history of small cell carcinoma of lung - on chemotherapy outpatient, last cycle finished 07/10/18. Dose of pegfilgrastim charted 07/11/18. Pt has history of pancytopenia per note.  S/p transfusion 1 unit PRBC on 9/29  Per discussion with MD, will target lower end of therapeutic range  APTT = 58 seconds is subtherapeutic. Per RN, no issues with infusion. No signs of bleeding.  Goal of Therapy:  Heparin level 0.3-0.5 units/ml APTT 66-84 seconds Monitor platelets by anticoagulation protocol: Yes   Plan:   No bolus given thrombocytopenia  Increase heparin infusion to 1100 units/hr  Check APTT in 8 hours. Once HL and APTT correlate, can monitor using HL.  Daily HL and CBC  Monitor for signs/syptoms of bleeding  Lenis Noon,  PharmD Clinical Pharmacist 07/28/2018,3:06 PM

## 2018-07-28 NOTE — Progress Notes (Signed)
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  No Known Allergies  Patient Measurements: Height: 5\' 10"  (177.8 cm) Weight: 161 lb 13.1 oz (73.4 kg) IBW/kg (Calculated) : 73 Heparin Dosing Weight: 73 kg  Vital Signs: Temp: 98.3 F (36.8 C) (09/30 1838) Temp Source: Oral (09/30 1838) BP: 138/82 (09/30 2111) Pulse Rate: 110 (09/30 2111)  Labs: Recent Labs    07/26/18 0434 07/27/18 0502 07/28/18 0219 07/28/18 0352 07/28/18 1409 07/28/18 2236  HGB 7.5* 6.6* 3.5* 7.3*  --   --   HCT 23.4* 20.7* 11.0* 22.2*  --   --   PLT 70* 80* 138*  --   --   --   APTT  --   --  54*  --  58* 55*  HEPARINUNFRC  --   --  1.32*  --  0.47  --   CREATININE 1.65* 1.59* 1.44*  --   --   --     Estimated Creatinine Clearance: 45.1 mL/min (A) (by C-G formula based on SCr of 1.44 mg/dL (H)).  Assessment: Monitoring/adjusting using PTT  Until it correlates with HL due to possible drug-lab interaction between oral anticoagulant (rivaroxaban, edoxaban, or apixaban) and anti-Xa level (aka heparin level)   Today, 07/28/18  Hgb 7.3 (initial lab draw of Hgb 3.5 this morning was suspected error. STAT redraw Hgb = 7.3)  Plt 138  Pt has history of small cell carcinoma of lung - on chemotherapy outpatient, last cycle finished 07/10/18. Dose of pegfilgrastim charted 07/11/18. Pt has history of pancytopenia per note.  S/p transfusion 1 unit PRBC on 9/29  Per discussion with MD, will target lower end of therapeutic range  2236 APTT = 55 seconds is subtherapeutic. Per RN, heparin was off from ~ 2010 to 2100 while patient was at MRI, no bleeding noted.   Goal of Therapy:  Heparin level 0.3-0.5 units/ml APTT 66-84 seconds Monitor platelets by anticoagulation protocol: Yes   Plan:   No bolus given thrombocytopenia  Will leave heparin drip at 1100 units/hr since drip was off for ~ 1 hour and HL was drawn only 1.5 hours after  restarted  Check APTT in 8 hours. Once HL and APTT correlate, can monitor using HL.  Daily HL and CBC  Monitor for signs/syptoms of bleeding  Dorrene German, PharmD Clinical Pharmacist 07/28/2018,11:28 PM

## 2018-07-28 NOTE — Progress Notes (Signed)
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  No Known Allergies  Patient Measurements: Height: 5\' 10"  (177.8 cm) Weight: 161 lb 13.1 oz (73.4 kg) IBW/kg (Calculated) : 73 Heparin Dosing Weight:   Vital Signs: Temp: 98.2 F (36.8 C) (09/30 0300) Temp Source: Oral (09/30 0300) BP: 132/79 (09/30 0300) Pulse Rate: 99 (09/30 0300)  Labs: Recent Labs    07/26/18 0434 07/27/18 0502 07/28/18 0219 07/28/18 0352  HGB 7.5* 6.6* 3.5* 7.3*  HCT 23.4* 20.7* 11.0* 22.2*  PLT 70* 80* 138*  --   APTT  --   --  54*  --   HEPARINUNFRC  --   --  1.32*  --   CREATININE 1.65* 1.59* 1.44*  --     Estimated Creatinine Clearance: 45.1 mL/min (A) (by C-G formula based on SCr of 1.44 mg/dL (H)).   Medications:  Medications Prior to Admission  Medication Sig Dispense Refill Last Dose  . acetaminophen (TYLENOL) 325 MG tablet Take 650 mg by mouth every 6 (six) hours as needed for moderate pain.   07/25/2018 at 1530  . albuterol (PROVENTIL HFA;VENTOLIN HFA) 108 (90 Base) MCG/ACT inhaler Inhale 2 puffs into the lungs every 6 (six) hours as needed for wheezing or shortness of breath.   unknown  . amoxicillin-clavulanate (AUGMENTIN) 500-125 MG tablet Take 1 tablet (500 mg total) by mouth 2 (two) times daily. 14 tablet 0 07/25/2018 at 1530  . atorvastatin (LIPITOR) 80 MG tablet TAKE ONE TABLET BY MOUTH DAILY AT 6 P.M. NEEDS APPOINTMENT FOR FUTURE REFILLS. (Patient taking differently: Take 80 mg by mouth daily at 6 PM. ) 90 tablet 3 07/24/2018 at Unknown time  . isosorbide mononitrate (IMDUR) 30 MG 24 hr tablet Take 1 tablet (30 mg total) by mouth daily. 90 tablet 3 07/24/2018 at pm  . metoprolol tartrate (LOPRESSOR) 25 MG tablet Take 1 tablet (25 mg total) by mouth 2 (two) times daily. 60 tablet 3 07/25/2018 at 0900  . nitroGLYCERIN (NITROSTAT) 0.4 MG SL tablet Place 1 tablet (0.4 mg total) under the tongue every 5 (five) minutes as  needed for chest pain. 25 tablet 3 unknown  . rivaroxaban (XARELTO) 20 MG TABS tablet Take 1 tablet (20 mg total) by mouth daily with supper. 30 tablet 1 07/24/2018 at 1800  . doxycycline (VIBRA-TABS) 100 MG tablet Take 1 tablet (100 mg total) by mouth 2 (two) times daily. (Patient not taking: Reported on 07/25/2018) 14 tablet 0 Completed Course at Unknown time  . furosemide (LASIX) 40 MG tablet Take 1 tablet by mouth daily for 3 days then taking only as needed. (Patient not taking: Reported on 05/10/2018) 10 tablet 0 Not Taking at Unknown time  . prochlorperazine (COMPAZINE) 10 MG tablet Take 1 tablet (10 mg total) by mouth every 6 (six) hours as needed for nausea or vomiting. (Patient not taking: Reported on 05/27/2018) 30 tablet 0 Not Taking at Unknown time  . Rivaroxaban 15 & 20 MG TBPK Take as directed on package: Take one 15mg  tablet by mouth twice a day with food. On Day 22, switch to one 20mg  tablet once a day with food. (Patient not taking: Reported on 06/12/2018) 51 each 0 Completed Course at Unknown time   Infusions:  . sodium chloride 75 mL/hr at 07/27/18 2226  . cefTRIAXone (ROCEPHIN)  IV 2 g (07/27/18 2229)  . heparin Stopped (07/28/18 0215)  . vancomycin Stopped (07/27/18 1206)    Assessment: Patient with high  heparin level but low PTT.  PTT ordered with Heparin level until both correlate due to possible drug-lab interaction between oral anticoagulant (rivaroxaban, edoxaban, or apixaban) and anti-Xa level (aka heparin level)   RN reports that when lab entered room to draw AM labs, noticed patient had lost IV site and was bleeding from site.  Unknown exactly how long IV was not running correctly.  Goal of Therapy:  Heparin level 0.3-0.7 units/ml Monitor platelets by anticoagulation protocol: Yes   Plan:  Will continue heparin at current rate, and draw PTT/heparin level 8hr after infusion restarts.  Asked RN to call pharmacy when heparin restarts  Nani Skillern  [place] 07/28/2018,4:50 AM

## 2018-07-28 NOTE — Telephone Encounter (Signed)
Pt admitted to hospital for "Infection". Please notify Mohamed. Appts for this week cancelled.

## 2018-07-28 NOTE — Progress Notes (Signed)
Hgb & Hct rechecked after critical lab value. Hgb is 7.3 & Hct is 22.2 X. Blount, NP notified.

## 2018-07-28 NOTE — Progress Notes (Signed)
PROGRESS NOTE    Christopher Wilson  SAY:[id number] DOB: 1941-08-29 DOA: 07/25/2018 PCP: Patient, No Pcp Per   Brief Narrative:  HPI per Dr. Ivor Costa on 07/25/18 Christopher Wilson is a 77 y.o. 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  Patient states that he has been having swelling and pain in left arm for more than 3 weeks .He was treated with one course of doxycycline without complete recovery, then started Augmentin on 9/27, still no significant improvement.  His pain is constant, 6 out of 10 severity, sharp, nonradiating.  Left arm is also erythematous.  He states that the infection started after he had IV infiltration initially. He developed fever of 102.7 yesterday with chills. Pt had UE venous doppler which was negative for DVT on 07/24/18.  Patient does not have chest pain, shortness breath, cough.  No nausea, vomiting, diarrhea or abdominal pain.  No symptoms of UTI or unilateral weakness.  **Feels ok. Was somewhat SOB yesterday. Right Arm still swollen thinks it is improving and is not as painful and improved.  Overnight on 9/28-9/29 blood count dropped to 6.6 so he transfused 1 unit of PRBCs and this AM his Hb was 3.5 (Not Accurate) and repeat was 7.3. Will Repeat blood transfuse 2 units of pRBC's. His Xarelto will be held and will be transitioned to a heparin drip given his renal function will watch for continued signs and symptoms of bleeding.  Assessment & Plan:   Principal Problem:   Cellulitis of left arm Active Problems:   CAD (coronary artery disease), with CABG 1989 and re-do Cabg 2002 X 3 vesssels.  progressive disease with Stent to Lt. 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  Blood pressure is soft, but currently hemodynamically stable.  Lactic acid is normal. -Patient failed outpatient oral antibiotics treatment.  Patient is immunosuppressed, will need antibiotics with broad coverage. -This physiology is improved -Admitted to Med-Surge -Empiric antimicrobial treatment with vancomycin and Rocephin (patient received 1 dose of clindamycin in ED) and will continue -PRN Zofran for nausea, and Percocet for pain - Blood cultures x 2 showed NGTD 3 Days  - ESR was 77 and CRP was 12.9 -will get Procalcitonin and trend lactic acid levels per sepsis protocol. -Given IVF: 1.5 L of NS bolus in ED, followed by 100 cc/hr but reduced to 75 mL now -Repeat CBC in AM; WBC wasimproving went from 33.7 -> now 26.6 -> 19.5 but now trended back up to 26.7 -Will get MRI of Wrist and Arm to evaluate for any deeper infection  -Will get Ortho to evaluate to make Further recommendations and discussed with Dr. Lorin Mercy  Wheezing and SOB -Reduce IVF Rate to 50 mL now and will stop.  -CXR showed that th Right Midlung Mass is Stable. Pleural Thickening vs. Tiny Right Effusion  which is unchanged -Started Nebs with Levalbuterol 0.63 mg q6h and Ipratropium 0.5 mg Neb q6h -Saturating well -Continue to Monitor Respiratory Status   CAD (coronary artery disease), with CABG 1989 and re-do Cabg 2002 X 3 vesssels.  progressive disease with Stent to Lt. Main, and 1st diag 2006, stent to VG to LCX 2007, with known occlusion history: No CP. -Continue Atorvastatin 80 mg po qHS, Isosorbide Mononitrate 30 mg po Daily, PRN  Nitroglycerin 0.4 mg sL q43mn PRN -Resumed Home Metoprolol 25 mg po BID   CKD (chronic kidney disease) stage 3, GFR 30-59 ml/min (HCC) -Stable.  Baseline creatinine 1.5-1.8.  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 MJulien Nordmann-Follow-up with Dr. MJulien Nordmann-Will notify Dr. MJulien [place] to [place] in AM   DVT, Lower Extremity; Left Leg -Continued Home Xarelto but Blood Count dropped so will hold and start Heparin gtt after Blood Transfusion   Hx of Pancytopenia but currently Anemic and Thrombocytopenic -Most likely due to Chemotherapy; Admission WBC Elevated as WBC was 26.6, Hb/Hct was 7.5/23.4, and Platelet Count was 70 yesterday AM -Now WBC was 26.7, Hb/Hct was 7.3/22.2, and Platelet Count was 138  this AM -Typed and screened and transfused 1 unit PRBCs but will transfuse 2 more Units  -Patient needs anticoagulation due to his recent leg DVT so will hold his home Xarelto and start heparin drip after blood transfusion and continue monitor for signs and symptoms of bleeding very carefully -Follow-up with CBC in a.m.  Hypophosphatemia -Patient's phosphorus level this morning was 2.2 -Replete with IV KPhos 20 mmol -Continue monitor and replete as necessary -Repeat Phos Level in AM  DVT prophylaxis: Anticoagulated with Xarelto is now held and on Heparin gtt  Code  Status: FULL CODE Family Communication: No family present at bedside  Disposition Plan: Remain Inpatient for continued Workup and Treatment   Consultants:   Orthopedics Dr. YLorin Mercy  Procedures:  None   Antimicrobials:  Anti-infectives (From admission, onward)   Start     Dose/Rate Route Frequency Ordered Stop   07/27/18 1000  vancomycin (VANCOCIN) 1,250 mg in sodium chloride 0.9 % 250 mL IVPB     1,250 mg 166.7 mL/hr over 90 Minutes Intravenous Every 36 hours 07/26/18 0611     07/25/18 2230  vancomycin (VANCOCIN) 1,500 mg in sodium chloride 0.9 % 500 mL IVPB     1,500 mg 250 mL/hr over 120 Minutes Intravenous  Once 07/25/18 2220 07/26/18 0156   07/25/18 2215  cefTRIAXone (ROCEPHIN) 2 g in sodium chloride 0.9 % 100 mL IVPB     2 g 200 mL/hr over 30 Minutes Intravenous Daily at bedtime 07/25/18 2211     07/25/18 2115  clindamycin (CLEOCIN) IVPB 600 mg     600 mg 100 mL/hr over 30 Minutes Intravenous  Once 07/25/18 2103 07/25/18 2227     Subjective: Seen and  examined at bedside and thinks wrist was doing better however was more painful today and still very red.  No chest pain, lightheadedness or dizziness.  Had no other concerns or complaints at this time but was worried about receiving his chemotherapy as he is scheduled for tomorrow but likely will be held as he is in the hospital. No other Concerns or complaints at this time  Objective: Vitals:   07/28/18 1409 07/28/18 1423 07/28/18 1452 07/28/18 1634  BP:  134/72 (!) 140/93 138/80  Pulse:  (!) 105 (!) 117 (!) 104  Resp:  _0 Temp:  98 F (36.7 C) 98 F (36.7 C) 97.7 F (36.5 C)  TempSrc:  Oral Oral Oral  SpO2: 95% 99% 96% 97%  Weight:      Height:        Intake/Output Summary (Last 24 hours) at 07/28/2018 1653 Last data filed at 07/28/2018 1634 Gross per 24 hour  Intake 1661.7 ml  Output 2550 ml  Net -888.3 ml   Filed Weights   07/26/18 0206  Weight: 73.4 kg   Examination: Physical  Exam:  Constitutional: Well-nourished, well-developed Caucasian male is currently no acute distress.  Appears calm and comfortable sitting up in bed watching television but does complain of some wrist pain today Eyes: Sclera anicteric.  Lids and conjunctive are normal ENMT: External ears and nose appear normal.  Mucous members are moist Neck: Appears supple with no JVD Respiratory: Diminished to auscultation bilaterally no appreciable wheezing, rales, rhonchi.  Patient was not tachypneic using accessory muscle breathe Cardiovascular: Regular rate and rhythm.  No appreciable murmurs, rubs, gallops. Abdomen: Soft, nontender, non-distended.  Bowel sounds present all 4 quadrants GU: Deferred Musculoskeletal: No contractures or cyanosis.  No joint deformities noted Skin: MR is not as swollen as yesterday and erythema is improving but still very erythematous on the wrist with painful to palpation.  Slightly indurated.  Rest of skin is warm and dry Neurologic: Cranial nerves II through XII grossly intact no appreciable focal deficits Psychiatric: Normal mood and affect.  Intact judgment insight.  Patient is awake, alert, and oriented x3  Data Reviewed: I have personally reviewed following labs and imaging studies  CBC: Recent Labs  Lab 07/22/18 1149 07/24/18 1123 07/25/18 1804 07/26/18 0434 07/27/18 0502 07/28/18 0219 07/28/18 0352  WBC 22.7* 26.2* 33.7* 26.6* 19.5* 26.7*  --   NEUTROABS 21.3* 22.7* 27.7*  --  16.7* 23.5*  --   HGB 8.6* 9.2* 8.3* 7.5* 6.6* 3.5* 7.3*  HCT 27.0* 29.1* 26.4* 23.4* 20.7* 11.0* 22.2*  MCV 95.7 96.3 98.9 99.6 99.0 97.3  --   PLT 38* 67* 75* 70* 80* 138*  --    Basic Metabolic Panel: Recent Labs  Lab 07/24/18 1123 07/25/18 1804 07/26/18 0434 07/27/18 0502 07/28/18 0219  NA 145 142 141 141 140  K 4.2 4.0 4.0 3.8 4.1  CL 110 108 112* 113* 111  CO2 _1 21*  GLUCOSE 94 125* 117* 103* 126*  BUN _2 CREATININE 2.13* 1.85* 1.65* 1.59*  1.44*  CALCIUM 9.5 9.0 8.4* 8.4* 8.6*  MG  --   --   --  1.8 1.8  PHOS  --   --   --  2.5 2.2*   GFR: Estimated Creatinine Clearance: 45.1 mL/min (A) (by C-G formula based on SCr of 1.44 mg/dL (H)). Liver Function Tests: Recent Labs  Lab 07/22/18 1149 07/24/18 1123 07/25/18 1804 07/27/18 0502 07/28/18 3474  AST 8* 10* 14* 10* 16  ALT _0 ALKPHOS 143* 164* 120 85 93  BILITOT 0.2* 0.3 0.7 0.5 0.7  PROT 6.4* 7.1 6.2* 5.1* 5.9*  ALBUMIN 3.0* 3.2* 3.0* 2.2* 2.6*   No results for input(s): LIPASE, AMYLASE in the last 168 hours. No results for input(s): AMMONIA in the last 168 hours. Coagulation Profile: No results for input(s): INR, PROTIME in the last 168 hours. Cardiac Enzymes: No results for input(s): CKTOTAL, CKMB, CKMBINDEX, TROPONINI in the last 168 hours. BNP (last 3 results) No results for input(s): PROBNP in the last 8760 hours. HbA1C: No results for input(s): HGBA1C in the last 72 hours. CBG: No results for input(s): GLUCAP in the last 168 hours. Lipid Profile: No results for input(s): CHOL, HDL, LDLCALC, TRIG, CHOLHDL, LDLDIRECT in the last 72 hours. Thyroid Function Tests: No results for input(s): TSH, T4TOTAL, FREET4, T3FREE, THYROIDAB in the last 72 hours. Anemia Panel: No results for input(s): VITAMINB12, FOLATE, FERRITIN, TIBC, IRON, RETICCTPCT in the last 72 hours. Sepsis Labs: Recent Labs  Lab 07/25/18 1810  LATICACIDVEN 1.70    Recent Results (from the past 240 hour(s))  Blood culture (routine x 2)     Status: None (Preliminary result)   Collection Time: 07/25/18  6:05 PM  Result Value Ref Range Status   Specimen Description SITE NOT SPECIFIED  Final   Special Requests   Final    BOTTLES DRAWN AEROBIC AND ANAEROBIC Blood Culture results may not be optimal due to an excessive volume of blood received in culture bottles Performed at [facility], [place] [address], [place], [place] [address]    Culture   Final    NO GROWTH 3  DAYS Performed at [facility], [place] [address], [place], [place] [address]    Report Status PENDING  Incomplete  Blood culture (routine x 2)     Status: None (Preliminary result)   Collection Time: 07/25/18  6:17 PM  Result Value Ref Range Status   Specimen Description   Final    BLOOD RIGHT HAND Performed at [place] [address] [place], [place] [address]    Special Requests   Final    BOTTLES DRAWN AEROBIC AND ANAEROBIC Blood Culture adequate volume Performed at [place] [address] [place], [place] [address]    Culture   Final    NO GROWTH 3 DAYS Performed at [facility], [place] [address], [place], [place] [address]    Report Status PENDING  Incomplete  Urine culture     Status: Abnormal   Collection Time: 07/25/18  7:40 PM  Result Value Ref Range Status   Specimen Description   Final    URINE, CLEAN CATCH Performed at [facility] : [facility], [place] [address] [place], [place] [address]    Special Requests   Final    NONE Performed at [facility], [place] [address], [place], [place] [address]    Culture (A)  Final    <10,000 COLONIES/mL INSIGNIFICANT GROWTH Performed at [place] [address] [place], [place] [address]    Report Status 07/27/2018 FINAL  Final    Radiology Studies: No results found. Scheduled Meds: . atorvastatin  80 mg Oral q1800  . ipratropium  0.5 mg Nebulization TID  . isosorbide mononitrate  30 mg Oral Daily  . levalbuterol  0.63 mg Nebulization TID  . nicotine  21 mg Transdermal  Daily   Continuous Infusions: . sodium chloride 75 mL/hr at 07/28/18 0543  . cefTRIAXone (ROCEPHIN)  IV Stopped (07/27/18 2259)  . heparin 1,100 Units/hr (07/28/18 1605)  . potassium PHOSPHATE IVPB (in mmol) 20 mmol (07/28/18 1214)  . vancomycin Stopped (07/27/18 1206)    LOS: 2 days   Kerney Elbe, DO [facility] PAGER is on [place]  If 7PM-7AM,  please contact night-coverage [email or web address] Password [email or web address] 07/28/2018, 4:53 PM

## 2018-07-29 ENCOUNTER — Inpatient Hospital Stay: Payer: Medicare HMO

## 2018-07-29 ENCOUNTER — Inpatient Hospital Stay: Payer: Medicare HMO | Admitting: Internal Medicine

## 2018-07-29 DIAGNOSIS — N179 Acute kidney failure, unspecified: Secondary | ICD-10-CM

## 2018-07-29 DIAGNOSIS — L03114 Cellulitis of left upper limb: Secondary | ICD-10-CM

## 2018-07-29 DIAGNOSIS — R6520 Severe sepsis without septic shock: Secondary | ICD-10-CM

## 2018-07-29 LAB — BPAM RBC
BLOOD PRODUCT EXPIRATION DATE: 201910232359
Blood Product Expiration Date: 201910242359
Blood Product Expiration Date: 201910252359
ISSUE DATE / TIME: 201909291247
ISSUE DATE / TIME: 201909301427
ISSUE DATE / TIME: 201909301639
UNIT TYPE AND RH: 6200
Unit Type and Rh: 6200
Unit Type and Rh: 6200

## 2018-07-29 LAB — TYPE AND SCREEN
ABO/RH(D): A POS
Antibody Screen: NEGATIVE
UNIT DIVISION: 0
Unit division: 0
Unit division: 0

## 2018-07-29 LAB — HEPARIN LEVEL (UNFRACTIONATED)
Heparin Unfractionated: 0.31 IU/mL (ref 0.30–0.70)
Heparin Unfractionated: 0.19 IU/mL — ABNORMAL LOW (ref 0.30–0.70)
Heparin Unfractionated: 0.14 IU/mL — ABNORMAL LOW (ref 0.30–0.70)

## 2018-07-29 LAB — CBC WITH DIFFERENTIAL/PLATELET
BASOS PCT: 0 %
Basophils Absolute: 0 10*3/uL (ref 0.0–0.1)
EOS ABS: 0.1 10*3/uL (ref 0.0–0.7)
Eosinophils Relative: 1 %
HCT: 29.1 % — ABNORMAL LOW (ref 39.0–52.0)
HEMOGLOBIN: 9.7 g/dL — AB (ref 13.0–17.0)
LYMPHS ABS: 1.1 10*3/uL (ref 0.7–4.0)
Lymphocytes Relative: 8 %
MCH: 31.2 pg (ref 26.0–34.0)
MCHC: 33.3 g/dL (ref 30.0–36.0)
MCV: 93.6 fL (ref 78.0–100.0)
Monocytes Absolute: 1.5 10*3/uL — ABNORMAL HIGH (ref 0.1–1.0)
Monocytes Relative: 11 %
NEUTROS PCT: 80 %
Neutro Abs: 11.3 10*3/uL — ABNORMAL HIGH (ref 1.7–7.7)
Platelets: 128 10*3/uL — ABNORMAL LOW (ref 150–400)
RBC: 3.11 MIL/uL — AB (ref 4.22–5.81)
RDW: 18 % — ABNORMAL HIGH (ref 11.5–15.5)
WBC: 13.9 10*3/uL — AB (ref 4.0–10.5)

## 2018-07-29 LAB — COMPREHENSIVE METABOLIC PANEL
ALBUMIN: 2.4 g/dL — AB (ref 3.5–5.0)
ALT: 16 U/L (ref 0–44)
ANION GAP: 6 (ref 5–15)
AST: 20 U/L (ref 15–41)
Alkaline Phosphatase: 96 U/L (ref 38–126)
BILIRUBIN TOTAL: 0.5 mg/dL (ref 0.3–1.2)
BUN: 11 mg/dL (ref 8–23)
CO2: 21 mmol/L — AB (ref 22–32)
CREATININE: 1.32 mg/dL — AB (ref 0.61–1.24)
Calcium: 8.6 mg/dL — ABNORMAL LOW (ref 8.9–10.3)
Chloride: 110 mmol/L (ref 98–111)
GFR calc Af Amer: 59 mL/min — ABNORMAL LOW (ref >60)
GFR, EST NON AFRICAN AMERICAN: 51 mL/min — AB (ref >60)
Glucose, Bld: 109 mg/dL — ABNORMAL HIGH (ref 70–99)
Potassium: 4.2 mmol/L (ref 3.5–5.1)
Sodium: 137 mmol/L (ref 135–145)
TOTAL PROTEIN: 5.9 g/dL — AB (ref 6.5–8.1)

## 2018-07-29 LAB — MAGNESIUM: MAGNESIUM: 1.8 mg/dL (ref 1.7–2.4)

## 2018-07-29 LAB — APTT
APTT: 44 s — AB (ref 24–36)
aPTT: 58 seconds — ABNORMAL HIGH (ref 24–36)

## 2018-07-29 LAB — PHOSPHORUS: PHOSPHORUS: 2.9 mg/dL (ref 2.5–4.6)

## 2018-07-29 MED ORDER — HEPARIN (PORCINE) IN NACL 100-0.45 UNIT/ML-% IJ SOLN
1200.0000 [IU]/h | INTRAMUSCULAR | Status: DC
  Filled 2018-07-29 (×2): qty 250

## 2018-07-29 MED ORDER — VANCOMYCIN HCL IN DEXTROSE 1-5 GM/200ML-% IV SOLN
1000.0000 mg | INTRAVENOUS | Status: DC
  Administered 2018-07-29: 1000 mg via INTRAVENOUS
  Filled 2018-07-29: qty 200

## 2018-07-29 MED ORDER — HEPARIN (PORCINE) IN NACL 100-0.45 UNIT/ML-% IJ SOLN
1350.0000 [IU]/h | INTRAMUSCULAR | Status: DC
  Administered 2018-07-29: 1350 [IU]/h via INTRAVENOUS
  Filled 2018-07-29: qty 250

## 2018-07-29 NOTE — Consult Note (Signed)
Reason for Consult:left wrist pain, cellulitis Referring Physician: Alfredia Ferguson MD  Christopher Wilson is an 77 y.o. male.  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  A couple days later started having swelling increased discomfort stiffness and some erythema.  He had 2 rounds of antibiotics 1 doxycycline and the other Augmentin  and states he is continued to have some stiffness and pain.  He developed fever to 102.7 on the day prior to admission.  Additional problems include hyperlipidemia, GERD, coronary artery disease, previous CABG, stent placement, stage III kidney disease, congenital solitary kidney, tobacco abuse, history of DVT on Xarelto, hypertension.  Patient currently on vancomycin.  Partial MRI performed due to patient being uncomfortable and unable to hold still despite sedation.  I have reviewed the MRI scan report pending.  WBC on 830/2019 was 26.7K.  A.m. labs shows WBC improved to 13.9K.  Past Medical History:  Diagnosis Date  . ACS (acute coronary syndrome), with ST elevation but stable coronary arteries on cath 06/27/12 06/27/2012  . CAD (coronary artery disease), with CABG 1989 and re-do Cabg 2002 X 3 vesssels.  progressive disease with Stent to Lt. Main, and 1st diag 2006, stent to VG to LCX 2007, with known occlusion history o  06/27/2012  . CKD (chronic kidney disease) stage 3, GFR 30-59 ml/min (HCC) 06/27/2012  . 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 Coronary artery disease   . GERD (gastroesophageal reflux disease)   . Hyperlipidemia LDL goal < 70 06/27/2012  . Hypertension   . Myocardial infarction ([place])   . Peptic ulcer   . STEMI (ST elevation myocardial infarction) ([place]) 06/27/2012  . Tobacco abuse 06/27/2012    Past Surgical History:  Procedure Laterality Date  . coratid arterty surgery    . CORONARY ANGIOPLASTY WITH STENT PLACEMENT    . CORONARY ARTERY BYPASS GRAFT    . IR GUIDED  DRAIN W CATHETER PLACEMENT  04/08/2018  . IR REMOVAL OF PLURAL CATH W/CUFF  06/19/2018  . LCEA    . LEFT HEART CATH N/A 06/27/2012   Procedure: LEFT HEART CATH;  Surgeon: Troy Sine, MD;  Location: [facility] CATH LAB;  Service: Cardiovascular;  Laterality: N/A;  . triple bipass     Hx of CABG and RE do  . VASCULAR SURGERY    . VIDEO BRONCHOSCOPY Bilateral 04/07/2018   Procedure: VIDEO BRONCHOSCOPY WITHOUT FLUORO;  Surgeon: Rush Farmer, MD;  Location[facility] ENDOSCOPY;  Service: Cardiopulmonary;  Laterality: Bilateral;    Family History  Problem Relation Age of Onset  . Stroke Mother   . Heart attack Father   . Stroke Maternal Aunt     Social History:  reports that he has been smoking. He has been smoking about 1.50 packs per day. He has never used smokeless tobacco. He reports that he drinks about 10.0 standard drinks of alcohol per week. He reports that he does not use drugs.  Allergies: No Known Allergies  Medications: I have reviewed the patient's current medications.  Results for orders placed or performed during the hospital encounter of 07/25/18 (from the past 48 hour(s))  Heparin level (unfractionated)     Status: Abnormal   Collection Time: 07/28/18  2:19 AM  Result Value Ref Range   Heparin Unfractionated 1.32 (H) 0.30 - 0.70 IU/mL    Comment: RESULTS CONFIRMED BY MANUAL DILUTION (NOTE) If heparin results are below expected values, and patient dosage has  been confirmed, suggest follow up testing of antithrombin III levels. Performed at [facility], [place] [address] [place], [place] [address]   APTT     Status: Abnormal   Collection Time: 07/28/18  2:19 AM  Result Value Ref Range   aPTT 54 (H) 24 - 36 seconds    Comment:        IF BASELINE aPTT IS ELEVATED, SUGGEST PATIENT RISK ASSESSMENT BE USED TO DETERMINE APPROPRIATE ANTICOAGULANT THERAPY. Performed at [facility], [place] [address] [place], [place] [address]   CBC with  Differential/Platelet     Status: Abnormal   Collection Time: 07/28/18  2:19 AM  Result Value Ref Range   WBC 26.7 (H) 4.0 - 10.5 K/uL   RBC 1.13 (L) 4.22 - 5.81 MIL/uL   Hemoglobin 3.5 (LL) 13.0 - 17.0 g/dL    Comment: REPEATED TO VERIFY CRITICAL RESULT CALLED TO, READ BACK BY AND VERIFIED WITH: B,INGRAM AT 0255 ON 07/28/18 BY A,MOHAMED    HCT 11.0 (L) 39.0 - 52.0 %   MCV 97.3 78.0 - 100.0 fL   MCH 31.0 26.0 - 34.0 pg   MCHC 31.8 30.0 - 36.0 g/dL   RDW 19.3 (H) 11.5 - 15.5 %   Platelets 138 (L) 150 - 400 K/uL    Comment: DELTA CHECK NOTED REPEATED TO VERIFY    Neutrophils Relative % 86 %   Lymphocytes Relative 8 %   Monocytes Relative 2 %   Eosinophils Relative 1 %   Basophils Relative 1 %   Metamyelocytes Relative 1 %   Myelocytes 1 %   Neutro Abs 23.5 (H) 1.7 - 7.7 K/uL   Lymphs Abs 2.1 0.7 - 4.0 K/uL   Monocytes Absolute 0.5 0.1 - 1.0 K/uL   Eosinophils Absolute 0.3 0.0 - 0.7 K/uL   Basophils Absolute 0.3 (H) 0.0 - 0.1 K/uL   RBC Morphology POLYCHROMASIA PRESENT    WBC Morphology MILD LEFT SHIFT (1-5% METAS, OCC MYELO, OCC BANDS)     Comment: Performed at [facility], [place] [address] [place], [place] [address]  Comprehensive metabolic panel     Status: Abnormal   Collection Time: 07/28/18  2:19 AM  Result Value Ref Range   Sodium 140 135 - 145 mmol/L   Potassium 4.1 3.5 - 5.1 mmol/L   Chloride 111 98 - 111 mmol/L   CO2 21 (L) 22 - 32 mmol/L   Glucose, Bld 126 (H) 70 - 99 mg/dL   BUN 14 8 - 23 mg/dL   Creatinine, Ser 1.44 (H) 0.61 - 1.24 mg/dL   Calcium 8.6 (L) 8.9 - 10.3 mg/dL   Total Protein 5.9 (L) 6.5 - 8.1 g/dL   Albumin 2.6 (L) 3.5 - 5.0 g/dL   AST 16 15 - 41 U/L   ALT 13 0 - 44 U/L   Alkaline Phosphatase 93 38 - 126 U/L   Total Bilirubin 0.7 0.3 - 1.2 mg/dL   GFR calc non Af Amer 46 (L) >60 mL/min   GFR calc Af Amer 53 (L) >60 mL/min    Comment: (NOTE) The eGFR has been calculated using the CKD EPI equation. This calculation has not  been validated in all clinical situations. eGFR's persistently <60 mL/min signify possible Chronic Kidney Disease.    Anion gap 8 5 - 15    Comment: Performed at [facility], [place] [address] [place], [place] [address]  Magnesium     Status: None   Collection Time:  07/28/18  2:19 AM  Result Value Ref Range   Magnesium 1.8 1.7 - 2.4 mg/dL    Comment: Performed at [facility], [place] [address] [place], [place] [address]  Phosphorus     Status: Abnormal   Collection Time: 07/28/18  2:19 AM  Result Value Ref Range   Phosphorus 2.2 (L) 2.5 - 4.6 mg/dL    Comment: Performed at [facility], [place] [address] [place], [place] [address]  Hemoglobin and hematocrit, blood     Status: Abnormal   Collection Time: 07/28/18  3:52 AM  Result Value Ref Range   Hemoglobin 7.3 (L) 13.0 - 17.0 g/dL    Comment: REPEATED TO VERIFY DELTA CHECK NOTED    HCT 22.2 (L) 39.0 - 52.0 %    Comment: Performed at [facility], [place] [address] [place], [place] [address]  Prepare RBC     Status: None   Collection Time: 07/28/18  9:04 AM  Result Value Ref Range   Order Confirmation      ORDER PROCESSED BY BLOOD BANK Performed at [facility], [place] [address], [place], [place] [address]   APTT     Status: Abnormal   Collection Time: 07/28/18  2:09 PM  Result Value Ref Range   aPTT 58 (H) 24 - 36 seconds    Comment:        IF BASELINE aPTT IS ELEVATED, SUGGEST PATIENT RISK ASSESSMENT BE USED TO DETERMINE APPROPRIATE ANTICOAGULANT THERAPY. Performed at [facility], [place] [address] [place], [place] [address]   Heparin level (unfractionated)     Status: None   Collection Time: 07/28/18  2:09 PM  Result Value Ref Range   Heparin Unfractionated 0.47 0.30 - 0.70 IU/mL    Comment: (NOTE) If heparin results are below expected values, and patient dosage has  been confirmed, suggest follow up testing of  antithrombin III levels. Performed at [facility], [place] [address], [place], [place] [address]   APTT     Status: Abnormal   Collection Time: 07/28/18 10:36 PM  Result Value Ref Range   aPTT 55 (H) 24 - 36 seconds    Comment:        IF BASELINE aPTT IS ELEVATED, SUGGEST PATIENT RISK ASSESSMENT BE USED TO DETERMINE APPROPRIATE ANTICOAGULANT THERAPY. Performed at [facility], [place] [address] [place], [place] [address]   Heparin level (unfractionated)     Status: None   Collection Time: 07/29/18  4:03 AM  Result Value Ref Range   Heparin Unfractionated 0.31 0.30 - 0.70 IU/mL    Comment: (NOTE) If heparin results are below expected values, and patient dosage has  been confirmed, suggest follow up testing of antithrombin III levels. Performed at [facility], [place] [address], [place], [place] [address]   CBC with Differential/Platelet     Status: Abnormal   Collection Time: 07/29/18  4:03 AM  Result Value Ref Range   WBC 13.9 (H) 4.0 - 10.5 K/uL   RBC 3.11 (L) 4.22 - 5.81 MIL/uL   Hemoglobin 9.7 (L) 13.0 - 17.0 g/dL    Comment: DELTA CHECK NOTED POST TRANSFUSION SPECIMEN    HCT 29.1 (L) 39.0 - 52.0 %   MCV 93.6 78.0 - 100.0 fL   MCH 31.2 26.0 - 34.0 pg   MCHC 33.3 30.0 - 36.0 g/dL   RDW 18.0 (H) 11.5 - 15.5 %   Platelets 128 (L) 150 - 400  K/uL   Neutrophils Relative % 80 %   Neutro Abs 11.3 (H) 1.7 - 7.7 K/uL   Lymphocytes Relative 8 %   Lymphs Abs 1.1 0.7 - 4.0 K/uL   Monocytes Relative 11 %   Monocytes Absolute 1.5 (H) 0.1 - 1.0 K/uL   Eosinophils Relative 1 %   Eosinophils Absolute 0.1 0.0 - 0.7 K/uL   Basophils Relative 0 %   Basophils Absolute 0.0 0.0 - 0.1 K/uL    Comment: Performed at [facility], [place] [address] [place], [place] [address]  Magnesium     Status: None   Collection Time: 07/29/18  4:03 AM  Result Value Ref Range   Magnesium 1.8 1.7 - 2.4 mg/dL    Comment: Performed at  [facility], [place] [address], [place], [place] [address]  Phosphorus     Status: None   Collection Time: 07/29/18  4:03 AM  Result Value Ref Range   Phosphorus 2.9 2.5 - 4.6 mg/dL    Comment: Performed at [facility], [place] [address], [place], [place] [address]  Comprehensive metabolic panel     Status: Abnormal   Collection Time: 07/29/18  4:03 AM  Result Value Ref Range   Sodium 137 135 - 145 mmol/L   Potassium 4.2 3.5 - 5.1 mmol/L   Chloride 110 98 - 111 mmol/L   CO2 21 (L) 22 - 32 mmol/L   Glucose, Bld 109 (H) 70 - 99 mg/dL   BUN 11 8 - 23 mg/dL   Creatinine, Ser 1.32 (H) 0.61 - 1.24 mg/dL   Calcium 8.6 (L) 8.9 - 10.3 mg/dL   Total Protein 5.9 (L) 6.5 - 8.1 g/dL   Albumin 2.4 (L) 3.5 - 5.0 g/dL   AST 20 15 - 41 U/L   ALT 16 0 - 44 U/L   Alkaline Phosphatase 96 38 - 126 U/L   Total Bilirubin 0.5 0.3 - 1.2 mg/dL   GFR calc non Af Amer 51 (L) >60 mL/min   GFR calc Af Amer 59 (L) >60 mL/min    Comment: (NOTE) The eGFR has been calculated using the CKD EPI equation. This calculation has not been validated in all clinical situations. eGFR's persistently <60 mL/min signify possible Chronic Kidney Disease.    Anion gap 6 5 - 15    Comment: Performed at [facility], [place] [address] [place], [place] [address]  APTT     Status: Abnormal   Collection Time: 07/29/18  4:03 AM  Result Value Ref Range   aPTT 58 (H) 24 - 36 seconds    Comment:        IF BASELINE aPTT IS ELEVATED, SUGGEST PATIENT RISK ASSESSMENT BE USED TO DETERMINE APPROPRIATE ANTICOAGULANT THERAPY. Performed at [facility], [place] [address], [place], [place] [address]     No results found.  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 Blood pressure 138/82, pulse 79, temperature 98.2 F (36.8 C), temperature source Oral, resp. rate 16, height _0  (1.778 m), weight 73.4 kg, SpO2 93 %. Physical Exam  Constitutional: He is oriented to person, place, and time. He appears well-developed and well-nourished.  HENT:  Head: Normocephalic.  Eyes: Pupils are equal, round, and reactive to light.  Neck: Normal range of motion.  Respiratory: Effort normal. He has no wheezes.  GI: Soft. He exhibits no distension.  Neurological: He is alert and oriented to person, place, and time.  Skin: There is erythema. No pallor.  Psychiatric: He has a normal mood and affect. His behavior is normal. Judgment and thought content normal.  Patient has mild erythema that extends from the dorsum of the hand MCP joints just proximal to the wrist.  Drawn lines over the volar forearm shows less erythema.  There is subcutaneous edema that extends to the dorsum in the olecranon area without olecranon bursal tenderness.  He has 50% wrist flexion extension with mild discomfort.  Extensors flexors are active.  No fluctuance.  Assessment/Plan: MRI report pending.  Partial imaging was completed.  His plain radiographs demonstrate some degenerative changes at the radial scaphoid joint negative for acute fracture.  Cutaneous prominence and edema noted an MRI scan.  I did not appreciate any areas consistant with abscess.  Will review later this morning MRI report.  My cell [id number].  Thank you for the opportunity see him in consultation.  Marybelle Killings 07/29/2018, 8:06 AM

## 2018-07-29 NOTE — Progress Notes (Signed)
PROGRESS NOTE    Christopher Wilson  IHW:[id number] DOB: 1941-06-17 DOA: 07/25/2018 PCP: Patient, No Pcp Per   Brief Narrative:  HPI per Dr. Ivor Wilson on 07/25/18 Christopher Wilson is a 77 y.o. 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  Patient states that he has been having swelling and pain in left arm for more than 3 weeks .He was treated with one course of doxycycline without complete recovery, then started Augmentin on 9/27, still no significant improvement.  His pain is constant, 6 out of 10 severity, sharp, nonradiating.  Left arm is also erythematous.  He states that the infection started after he had IV infiltration initially. He developed fever of 102.7 yesterday with chills. Pt had UE venous doppler which was negative for DVT on 07/24/18.  Patient does not have chest pain, shortness breath, cough.  No nausea, vomiting, diarrhea or abdominal pain.  No symptoms of UTI or unilateral weakness.  **Feels ok. Was somewhat SOB yesterday. Right Arm still swollen thinks it is improving and is not as painful and improved.  Overnight on 9/28-9/29 blood count dropped to 6.6 so he transfused 1 unit of PRBCs and this AM his Hb was 3.5 (Not Accurate) and repeat was 7.3. Repeat blood transfuse 2 units of pRBC's yesterday. His Xarelto will be held and will be transitioned to a heparin drip given his renal function will watch for continued signs and symptoms of bleeding.  Orthopedic surgery was consulted for further evaluation and patient underwent MRI of his wrist.  Assessment & Plan:   Principal Problem:   Cellulitis of left arm Active Problems:   CAD (coronary artery disease), with CABG 1989 and re-do Cabg 2002 X 3 vesssels.  progressive disease with Stent to Lt. 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  Blood pressure is soft, but currently hemodynamically stable.  Lactic acid is normal. -Patient failed outpatient oral antibiotics treatment.  Patient is immunosuppressed, will need antibiotics with broad coverage. -This physiology is improved -Admitted to Med-Surge -Empiric antimicrobial treatment with vancomycin and Rocephin (patient received 1 dose of clindamycin in ED) and will continue -PRN Zofran for nausea, and Percocet for pain - Blood cultures x 2 showed NGTD 4 Days  - ESR was 77 and CRP was 12.9 -will get Procalcitonin and trend lactic acid levels per sepsis protocol. -Given IVF: 1.5 L of NS bolus in ED, followed by 100 cc/hr but reduced to 75 mL now -Repeat CBC in AM; WBC improving is now 13.9 -Will get MRI of Wrist and Arm to evaluate for any deeper infection and did not show any abscess but did show as scapholunate ligament tear with scapholunate orientation appearing dorsal enterocolitis segmental instability and significant osteoarthritis and spurring with subcutaneous edema in the distal forearm and wrist -Will get Ortho to evaluate to make  Further recommendations and discussed with Dr. Lorin Wilson he saw the patient today.  He will review the MRI and make further recommendations  Wheezing and SOB improved -IV fluids stopped -CXR showed that th Right Midlung Mass is Stable. Pleural Thickening vs. Tiny Right Effusion which is unchanged -Started Nebs with Levalbuterol 0.63 mg q6h and Ipratropium 0.5 mg Neb  q6h -Saturating well -Continue to Monitor Respiratory Status  -Repeat chest x-ray in a.m.  CAD (coronary artery disease), with CABG 1989 and re-do Cabg 2002 X 3 vesssels.  progressive disease with Stent to Lt. Main, and 1st diag 2006, stent to VG to LCX 2007, with known occlusion history: No CP. -Continue Atorvastatin 80 mg po qHS, Isosorbide Mononitrate 30 mg po Daily, PRN Nitroglycerin 0.4 mg sL q63mn PRN -Resumed Home Metoprolol 25 mg po BID   CKD (chronic kidney disease) stage 3, GFR 30-59 ml/min (HCC) -Stable.  Baseline creatinine 1.5-1.8.  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 MJulien Wilson-Follow-up with Dr. MJulien Wilson-Will notify Dr. [place]Jul[place] to [place] in AM   DVT, Lower Extremity; Left Leg -Continued Home Xarelto but Blood Count dropped so will hold and start Heparin gtt after Blood Transfusion we will continue  Hx of Pancytopenia but currently Anemic and Thrombocytopenic -Most likely due to Chemotherapy; Admission WBC Elevated as WBC was 26.6, Hb/Hct was 7.5/23.4, and Platelet Count was 70 yesterday AM -Yesterday WBC was 26.7, Hb/Hct was 7.3/22.2, and Platelet Count was 138  this AM -Typed and screened and transfused 1 unit PRBCs but will transfuse 2 more Units; globin/hematocrit has appropriate response is now 9.7/29.1 and platelet count is  120,000 -Patient needs anticoagulation due to his recent leg DVT so will hold his home Xarelto and start heparin drip after blood transfusion and continue monitor for signs and symptoms of bleeding very carefully -Follow-up with CBC in a.m.  Hypophosphatemia -Patient's phosphorus level this morning was 2.2 and improved to 2.9 -Replete with IV KPhos 20 mmol yesterday -Continue monitor and replete as necessary -Repeat Phos Level in AM  DVT prophylaxis: Anticoagulated with Xarelto is now held and on Heparin gtt  Code Status: FULL CODE Family Communication: No family present at bedside  Disposition Plan: Remain Inpatient for continued Workup and Treatment   Consultants:   Orthopedics Dr. YLorin Wilson  Procedures:  None   Antimicrobials:  Anti-infectives (From admission, onward)   Start     Dose/Rate Route Frequency Ordered Stop   07/29/18 2200  vancomycin (VANCOCIN) IVPB 1000 mg/200 mL premix     1,000 mg 200 mL/hr over 60 Minutes Intravenous Every 24 hours 07/29/18 1150     07/27/18 1000  vancomycin (VANCOCIN) 1,250 mg in sodium chloride 0.9 % 250 mL IVPB  Status:  Discontinued     1,250 mg 166.7 mL/hr over 90 Minutes Intravenous Every 36 hours 07/26/18 0611 07/29/18 1150   07/25/18 2230  vancomycin (VANCOCIN) 1,500 mg in sodium chloride 0.9 % 500 mL IVPB     1,500 mg 250 mL/hr over 120 Minutes Intravenous  Once 07/25/18 2220 07/26/18 0156   07/25/18 2215  cefTRIAXone (ROCEPHIN) 2 g in sodium chloride 0.9 % 100 mL IVPB     2 g 200 mL/hr over 30 Minutes Intravenous Daily at bedtime 07/25/18 2211     07/25/18 2115  clindamycin (CLEOCIN) IVPB 600 mg     600 mg 100 mL/hr over 30 Minutes Intravenous  Once 07/25/18 2103 07/25/18 2227     Subjective: Seen and examined at bedside and his wrist was still swollen but not as erythematous.  States that there is no pain he has more motion.  No chest pain, lightheadedness or dizziness.  Still feels slightly fatigued.  No other concerns or  complaints at this time  Objective: Vitals:   07/29/18 0559 07/29/18 0834 07/29/18 1326 07/29/18 1349  BP: 138/82   135/79  Pulse: 79   82  Resp: 16   16  Temp: 98.2 F (36.8 C)   99.2 F (37.3 C)  TempSrc: Oral   Oral  SpO2: 93% 94% 95% 95%  Weight:      Height:        Intake/Output Summary (Last 24 hours) at 07/29/2018 1833 Last data filed at 07/29/2018 1459 Gross per 24 hour  Intake 2398.84 ml  Output 2300 ml  Net 98.84 ml   Filed Weights   07/26/18 0206  Weight: 73.4 kg   Examination: Physical Exam:  Constitutional: Well-nourished, well-developed Caucasian male is currently no acute distress Eyes: Sclera anicteric.  Lids and conjunctive are normal ENMT: External ears and nose appear normal.  Mucous members are moist Neck: Appears supple no JVD Respiratory: Diminished to auscultation bilaterally no appreciable wheezing, rales, rhonchi.  Patient was not secondary to excess muscle breathe Cardiovascular: Regular rate and rhythm.  No appreciable murmurs, rubs, gallops. Abdomen: Soft, nontender, nondistended.  Bowel sounds present all 4 quadrants GU: Deferred Musculoskeletal: No contractures or cyanosis.  No joint deformity noted; left leg slightly swollen in setting of DVT Skin: Skin is warm and dry with no appreciable rashes but left wrist is not swollen and left arm is slightly more swollen than yesterday but not erythematous.  Wrist is not as painful to palpation.  Still has mild induration. Neurologic: Cranial nerves II through XII grossly intact no appreciable focal deficits Psychiatric: Normal pleasant mood and affect.  Intact judgment insight.  Patient is awake, alert, oriented x3  Data Reviewed: I have personally reviewed following labs and imaging studies  CBC: Recent Labs  Lab 07/24/18 1123  07/25/18 1804 07/26/18 0434 07/27/18 0502 07/28/18 0219 07/28/18 0352 07/29/18 0403  WBC 26.2*  --  33.7* 26.6* 19.5* 26.7*  --  13.9*  NEUTROABS 22.7*  --  27.7*   --  16.7* 23.5*  --  11.3*  HGB 9.2*   < > 8.3* 7.5* 6.6* 3.5* 7.3* 9.7*  HCT 29.1*  --  26.4* 23.4* 20.7* 11.0* 22.2* 29.1*  MCV 96.3  --  98.9 99.6 99.0 97.3  --  93.6  PLT 67*  --  75* 70* 80* 138*  --  128*   < > = values in this interval not displayed.   Basic Metabolic Panel: Recent Labs  Lab 07/25/18 1804 07/26/18 0434 07/27/18 0502 07/28/18 0219 07/29/18 0403  NA 142 141 141 140 137  K 4.0 4.0 3.8 4.1 4.2  CL 108 112* 113* 111 110  CO2 '24 23 23 ' 21* 21*  GLUCOSE 125* 117* 103* 126* 109*  BUN '21 19 18 14 11  ' CREATININE 1.85* 1.65* 1.59* 1.44* 1.32*  CALCIUM 9.0 8.4* 8.4* 8.6* 8.6*  MG  --   --  1.8 1.8 1.8  PHOS  --   --  2.5 2.2* 2.9   GFR: Estimated Creatinine Clearance: 49.2 mL/min (A) (by C-G formula based on SCr of 1.32 mg/dL (H)). Liver Function Tests: Recent Labs  Lab 07/24/18 1123 07/25/18 1804 07/27/18 0502 07/28/18 0219 07/29/18 0403  AST 10* 14* 10* 16 20  ALT '8 9 7 13 16  ' ALKPHOS 164* 120 85 93 96  BILITOT 0.3 0.7 0.5 0.7 0.5  PROT 7.1 6.2* 5.1* 5.9* 5.9*  ALBUMIN 3.2* 3.0* 2.2* 2.6* 2.4*   No results for input(s): LIPASE, AMYLASE in the last 168 hours. No results for input(s): AMMONIA in the last 168 hours. Coagulation Profile: No results for input(s): INR, PROTIME in the last 168 hours. Cardiac Enzymes: No results for input(s): CKTOTAL, CKMB, CKMBINDEX, TROPONINI in the last 168 hours. BNP (last 3 results) No results for input(s): PROBNP in the last 8760 hours. HbA1C: No results for input(s): HGBA1C in the last 72 hours. CBG: No results for input(s): GLUCAP in the last 168 hours. Lipid Profile: No results for input(s): CHOL, HDL, LDLCALC, TRIG, CHOLHDL, LDLDIRECT in the last 72 hours. Thyroid Function Tests: No results for input(s): TSH, T4TOTAL, FREET4, T3FREE, THYROIDAB in the last 72 hours. Anemia Panel: No results for input(s): VITAMINB12, FOLATE, FERRITIN, TIBC, IRON, RETICCTPCT in the last 72 hours. Sepsis Labs: Recent Labs  Lab  07/25/18 1810  LATICACIDVEN 1.70    Recent Results (from the past 240 hour(s))  Blood culture (routine x 2)     Status: None (Preliminary result)   Collection Time: 07/25/18  6:05 PM  Result Value Ref Range Status   Specimen Description SITE NOT SPECIFIED  Final   Special Requests   Final    BOTTLES DRAWN AEROBIC AND ANAEROBIC Blood Culture results may not be optimal due to an excessive volume of blood received in culture bottles Performed at [facility], [place] [address], [place], [place] [address]    Culture   Final    NO GROWTH 4 DAYS Performed at [facility], [place] [address], [place], [place] [address]    Report Status PENDING  Incomplete  Blood culture (routine x 2)     Status: None (Preliminary result)   Collection Time: 07/25/18  6:17 PM  Result Value Ref Range Status   Specimen Description   Final    BLOOD RIGHT HAND Performed at [facility] [address], [place], [place] [address]    Special Requests   Final    BOTTLES DRAWN AEROBIC AND ANAEROBIC Blood Culture adequate volume Performed at [facility] [address] [place], [place] [address]    Culture   Final    NO GROWTH 4 DAYS Performed at [facility], [place] [address] [place], [place] [address]    Report Status PENDING  Incomplete  Urine culture     Status: Abnormal   Collection Time: 07/25/18  7:40 PM  Result Value Ref Range Status   Specimen Description   Final    URINE, CLEAN CATCH Performed at [facility], [place] [address], [place], [place] [address]    Special Requests   Final    NONE Performed at [facility], [place] [address] [place], [place] [address]    Culture (A)  Final    <10,000 COLONIES/mL INSIGNIFICANT GROWTH Performed at [place] [address] [place], [place] [address]    Report Status 07/27/2018 FINAL  Final    Radiology Studies: Mr Wrist Left Wo  Contrast  Result Date: 07/29/2018 CLINICAL DATA:  Ongoing wrist pain and swelling for 3 weeks. EXAM: MR OF THE LEFT WRIST WITHOUT CONTRAST TECHNIQUE: Multiplanar, multisequence MR imaging of the left wrist was performed. No intravenous contrast was administered. COMPARISON:  Hand radiographs from 07/25/2018 FINDINGS: Despite efforts by the technologist and patient, motion artifact is present on today's exam and could not be eliminated. This reduces exam sensitivity and specificity. Ligaments: Greater than expected fluid signal and separation of the scapholunate articulation. Triangular fibrocartilage: Unremarkable Tendons: Unremarkable Carpal tunnel/median nerve: Unremarkable Guyon's canal: Unremarkable Joint/cartilage: Considerable degenerative articular cartilage thinning and some degenerative spurring at the radiocarpal articulation. No overt joint effusion. There is notable spurring at the distal radioulnar joint without joint effusion. Bones/carpal alignment: Indistinct 6 mm focus of accentuated T2 signal in the volar capitate, probably a geode. Mild degenerative spurring of the first carpometacarpal articulation. Scapholunate orientation favoring dorsal intercalated segmental instability. Other: Subcutaneous edema in the wrist and distal forearm. IMPRESSION: 1. 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 2. Considerable radiocarpal osteoarthritis with associated spurring and loss of articular space. There is also osteoarthritis of the distal radioulnar joint. 3. Mild degenerative spurring of the first carpometacarpal articulation. 4. Subcutaneous edema in the distal forearm and wrist. Electronically Signed   By: Van Clines M.D.   On: 07/29/2018 10:24   Scheduled Meds: . atorvastatin  80 mg Oral q1800  . ipratropium  0.5 mg Nebulization TID  . isosorbide mononitrate  30 mg Oral Daily  . levalbuterol  0.63 mg Nebulization TID  .  metoprolol tartrate  25 mg Oral BID  . nicotine  21 mg Transdermal Daily   Continuous Infusions: . cefTRIAXone (ROCEPHIN)  IV Stopped (07/28/18 2310)  . heparin 1,350 Units/hr (07/29/18 1543)  . vancomycin      LOS: 3 days   Kerney Elbe, DO [facility] PAGER is on [place]  If 7PM-7AM, please contact night-coverage [email or web address] Password [facility] 07/29/2018, 6:33 PM

## 2018-07-29 NOTE — Progress Notes (Signed)
Pharmacy Antibiotic Note  Christopher Wilson is a 77 y.o. male admitted on 07/25/2018 with cellulitis.  Pharmacy has been consulted for Vancomycin dosing.  PMH significant for SCLC on chemotherapy outpatient, HTN, CAD, CABG s/p stent, CKD, DVT on rivaroxaban. Pt present with swelling/pain in left arm for > 3 weeks, was on doxycycline followed by Augmentin PTA without significant improvement.  Today, 07/29/18  WBC 13.9 remains elevated but significant decrease from yesterday  SCr 1.3 continues to trend down, CrCl ~ 50 mL/min  Afebrile  Day #5 IV antibiotics on vancomycin + ceftriaxone 2 g IV daily  Plan:  Given improvement in renal function, will need to adjust vancomycin dose to target AUC of 400-500. Order vancomycin 1000 mg IV q24h  Continue to closely monitor renal function  Goal AUC 400-500  Once at steady state, check vancomycin levels as indicated  Follow cultures and clinical course   Height: 5\' 10"  (177.8 cm) Weight: 161 lb 13.1 oz (73.4 kg) IBW/kg (Calculated) : 73  Temp (24hrs), Avg:98 F (36.7 C), Min:97.5 F (36.4 C), Max:98.5 F (36.9 C)  Recent Labs  Lab 07/25/18 1804 07/25/18 1810 07/26/18 0434 07/27/18 0502 07/28/18 0219 07/29/18 0403  WBC 33.7*  --  26.6* 19.5* 26.7* 13.9*  CREATININE 1.85*  --  1.65* 1.59* 1.44* 1.32*  LATICACIDVEN  --  1.70  --   --   --   --     Estimated Creatinine Clearance: 49.2 mL/min (A) (by C-G formula based on SCr of 1.32 mg/dL (H)).    No Known Allergies  Antimicrobials this admission: Vancomycin 07/25/2018 >> Ceftriaxone 07/25/2018 >>   Dose adjustments this admission: 10/1: vancomycin 1250 mg IV q36 --> 1000 mg IV q24h  Microbiology results: 9/27 Blood: no growth 4 days 9/27: Urine: <10K insignificant growth  Thank you for allowing pharmacy to be a part of this patient's care.  Lenis Noon, PharmD Clinical Pharmacist 07/29/2018 11:51 AM

## 2018-07-29 NOTE — Progress Notes (Signed)
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  No Known Allergies  Patient Measurements: Height: 5\' 10"  (177.8 cm) Weight: 161 lb 13.1 oz (73.4 kg) IBW/kg (Calculated) : 73 Heparin Dosing Weight: 73 kg  Vital Signs: Temp: 99.2 F (37.3 C) (10/01 1349) Temp Source: Oral (10/01 1349) BP: 135/79 (10/01 1349) Pulse Rate: 82 (10/01 1349)  Labs: Recent Labs    07/27/18 0502  07/28/18 0219 07/28/18 0352 07/28/18 1409 07/28/18 2236 07/29/18 0403 07/29/18 1444  HGB 6.6*  --  3.5* 7.3*  --   --  9.7*  --   HCT 20.7*  --  11.0* 22.2*  --   --  29.1*  --   PLT 80*  --  138*  --   --   --  128*  --   APTT  --    < > 54*  --  58* 55* 58* 44*  HEPARINUNFRC  --    < > 1.32*  --  0.47  --  0.31 0.19*  CREATININE 1.59*  --  1.44*  --   --   --  1.32*  --    < > = values in this interval not displayed.    Estimated Creatinine Clearance: 49.2 mL/min (A) (by C-G formula based on SCr of 1.32 mg/dL (H)).  Assessment: Patient was on rivaroxaban for hx DVT (started July 2019), last dose 9/28. Converted to heparin drip for anticoagulation on 9/29 due to renal function.  Significant Events:  Pt has history of small cell carcinoma of lung - on chemotherapy outpatient, last cycle finished 07/10/18. Dose of pegfilgrastim charted 07/11/18. Pt has history of pancytopenia per note.  Transfused on 9/29 and 9/30  SCr 1.3, CrCl ~48 mL/min - improving  Per discussion with MD, will target lower end of therapeutic range  Today, 07/29/18  Hgb 9.7 increased s/p transfusion on 9/30  Plt 128  APTT = 44 seconds, HL = 0.19 - both correlate in subtherapeutic range on heparin 1200 units/hr. Will begin monitoring using HL only  Spoke with patient and RN - no signs/symptoms of bleeding. Per RN, no interruptions in or issues with heparin infusion.  Goal of Therapy:  Heparin level 0.3-0.5 units/ml APTT 66-84 seconds Monitor platelets  by anticoagulation protocol: Yes   Plan:   No bolus given thrombocytopenia and recent transfusion  Increase heparin drip to 1350 units/hr  Check HL in 8 hours.   Daily HL and CBC  Monitor for signs/syptoms of bleeding  Lenis Noon, PharmD Clinical Pharmacist 07/29/2018,3:25 PM

## 2018-07-29 NOTE — Progress Notes (Signed)
MRI  Radiologist report  reviewed. No abscess. degen changes in wrist.

## 2018-07-29 NOTE — Progress Notes (Signed)
PT demonstrated hands on understanding of Flutter device. 

## 2018-07-29 NOTE — Care Management Important Message (Signed)
Important Message  Patient Details  Name: Lowry O [name] MRN: [id number] Date of Birth: [birth year]   Medicare Important Message Given:  Yes    Kerin Salen 07/29/2018, 11:54 AMImportant Message  Patient Details  Name: Tyreece O [name] MRN: [id number] Date of Birth: 1941-07-13   Medicare Important Message Given:  Yes    Kerin Salen 07/29/2018, 11:54 AM

## 2018-07-29 NOTE — Progress Notes (Signed)
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  No Known Allergies  Patient Measurements: Height: 5\' 10"  (177.8 cm) Weight: 161 lb 13.1 oz (73.4 kg) IBW/kg (Calculated) : 73 Heparin Dosing Weight: 73 kg  Vital Signs: Temp: 98.2 F (36.8 C) (10/01 0559) Temp Source: Oral (10/01 0559) BP: 138/82 (10/01 0559) Pulse Rate: 79 (10/01 0559)  Labs: Recent Labs    07/27/18 0502  07/28/18 0219 07/28/18 0352 07/28/18 1409 07/28/18 2236 07/29/18 0403  HGB 6.6*  --  3.5* 7.3*  --   --  9.7*  HCT 20.7*  --  11.0* 22.2*  --   --  29.1*  PLT 80*  --  138*  --   --   --  128*  APTT  --    < > 54*  --  58* 55* 58*  HEPARINUNFRC  --   --  1.32*  --  0.47  --  0.31  CREATININE 1.59*  --  1.44*  --   --   --  1.32*   < > = values in this interval not displayed.    Estimated Creatinine Clearance: 49.2 mL/min (A) (by C-G formula based on SCr of 1.32 mg/dL (H)).  Assessment: Monitoring/adjusting using PTT  Until it correlates with HL due to possible drug-lab interaction between oral anticoagulant (rivaroxaban, edoxaban, or apixaban) and anti-Xa level (aka heparin level)    9/30  Hgb 7.3 (initial lab draw of Hgb 3.5 this morning was suspected error. STAT redraw Hgb = 7.3)  Plt 138  Pt has history of small cell carcinoma of lung - on chemotherapy outpatient, last cycle finished 07/10/18. Dose of pegfilgrastim charted 07/11/18. Pt has history of pancytopenia per note.  S/p transfusion 1 unit PRBC on 9/29  Per discussion with MD, will target lower end of therapeutic range  2236 APTT = 55 seconds is subtherapeutic. Per RN, heparin was off from ~ 2010 to 2100 while patient was at MRI, no bleeding noted.   Today, 10/1  0403 aptt = 58 and HL=0.31, aptt below goal still, no bleeding or infusion issues per RN.  Goal of Therapy:  Heparin level 0.3-0.5 units/ml APTT 66-84 seconds Monitor platelets by anticoagulation  protocol: Yes   Plan:   No bolus given thrombocytopenia  Increase heparin drip to 1200 units/hr  Check APTT and HLin 8 hours. Once HL and APTT correlate, can monitor using HL.  Daily HL and CBC  Monitor for signs/syptoms of bleeding  Dorrene German, PharmD Clinical Pharmacist 07/29/2018,6:15 AM

## 2018-07-30 ENCOUNTER — Inpatient Hospital Stay: Payer: Medicare HMO

## 2018-07-30 ENCOUNTER — Inpatient Hospital Stay (HOSPITAL_COMMUNITY): Payer: Medicare HMO

## 2018-07-30 LAB — CBC WITH DIFFERENTIAL/PLATELET
BASOS ABS: 0 10*3/uL (ref 0.0–0.1)
Basophils Relative: 0 %
EOS ABS: 0.1 10*3/uL (ref 0.0–0.7)
EOS PCT: 0 %
HCT: 29.8 % — ABNORMAL LOW (ref 39.0–52.0)
Hemoglobin: 9.5 g/dL — ABNORMAL LOW (ref 13.0–17.0)
Lymphocytes Relative: 8 %
Lymphs Abs: 1.1 10*3/uL (ref 0.7–4.0)
MCH: 30.6 pg (ref 26.0–34.0)
MCHC: 31.9 g/dL (ref 30.0–36.0)
MCV: 96.1 fL (ref 78.0–100.0)
Monocytes Absolute: 1.7 10*3/uL — ABNORMAL HIGH (ref 0.1–1.0)
Monocytes Relative: 12 %
Neutro Abs: 11.1 10*3/uL — ABNORMAL HIGH (ref 1.7–7.7)
Neutrophils Relative %: 80 %
PLATELETS: 145 10*3/uL — AB (ref 150–400)
RBC: 3.1 MIL/uL — AB (ref 4.22–5.81)
RDW: 17.8 % — ABNORMAL HIGH (ref 11.5–15.5)
WBC: 13.9 10*3/uL — AB (ref 4.0–10.5)

## 2018-07-30 LAB — CULTURE, BLOOD (ROUTINE X 2)
CULTURE: NO GROWTH
Culture: NO GROWTH
Special Requests: ADEQUATE

## 2018-07-30 LAB — MAGNESIUM: Magnesium: 1.8 mg/dL (ref 1.7–2.4)

## 2018-07-30 LAB — HEPARIN LEVEL (UNFRACTIONATED): Heparin Unfractionated: 0.34 IU/mL (ref 0.30–0.70)

## 2018-07-30 LAB — COMPREHENSIVE METABOLIC PANEL
ALT: 18 U/L (ref 0–44)
AST: 19 U/L (ref 15–41)
Albumin: 2.3 g/dL — ABNORMAL LOW (ref 3.5–5.0)
Alkaline Phosphatase: 98 U/L (ref 38–126)
Anion gap: 7 (ref 5–15)
BUN: 13 mg/dL (ref 8–23)
CHLORIDE: 109 mmol/L (ref 98–111)
CO2: 21 mmol/L — AB (ref 22–32)
CREATININE: 1.43 mg/dL — AB (ref 0.61–1.24)
Calcium: 8.5 mg/dL — ABNORMAL LOW (ref 8.9–10.3)
GFR calc non Af Amer: 46 mL/min — ABNORMAL LOW (ref >60)
GFR, EST AFRICAN AMERICAN: 53 mL/min — AB (ref >60)
Glucose, Bld: 119 mg/dL — ABNORMAL HIGH (ref 70–99)
Potassium: 4.2 mmol/L (ref 3.5–5.1)
SODIUM: 137 mmol/L (ref 135–145)
Total Bilirubin: 0.5 mg/dL (ref 0.3–1.2)
Total Protein: 5.3 g/dL — ABNORMAL LOW (ref 6.5–8.1)

## 2018-07-30 LAB — PHOSPHORUS: Phosphorus: 2.8 mg/dL (ref 2.5–4.6)

## 2018-07-30 MED ORDER — RIVAROXABAN 20 MG PO TABS
20.0000 mg | ORAL_TABLET | Freq: Every day | ORAL | Status: DC
  Administered 2018-07-30: 20 mg via ORAL
  Filled 2018-07-30: qty 1

## 2018-07-30 MED ORDER — HEPARIN (PORCINE) IN NACL 100-0.45 UNIT/ML-% IJ SOLN
1500.0000 [IU]/h | INTRAMUSCULAR | Status: DC
  Administered 2018-07-30 (×2): 1500 [IU]/h via INTRAVENOUS
  Filled 2018-07-30: qty 250

## 2018-07-30 MED ORDER — DOXYCYCLINE HYCLATE 100 MG PO TABS
100.0000 mg | ORAL_TABLET | Freq: Two times a day (BID) | ORAL | Status: DC
  Administered 2018-07-30 – 2018-07-31 (×2): 100 mg via ORAL
  Filled 2018-07-30 (×2): qty 1

## 2018-07-30 NOTE — Progress Notes (Addendum)
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  No Known Allergies  Patient Measurements: Height: 5\' 10"  (177.8 cm) Weight: 161 lb 13.1 oz (73.4 kg) IBW/kg (Calculated) : 73 Heparin Dosing Weight: 73 kg  Vital Signs: Temp: 98 F (36.7 C) (10/02 0514) Temp Source: Oral (10/02 0514) BP: 128/79 (10/02 1000) Pulse Rate: 79 (10/02 1000)  Labs: Recent Labs    07/28/18 0219 07/28/18 0352  07/28/18 2236 07/29/18 0403 07/29/18 1444 07/29/18 2322 07/30/18 0516  HGB 3.5* 7.3*  --   --  9.7*  --   --  9.5*  HCT 11.0* 22.2*  --   --  29.1*  --   --  29.8*  PLT 138*  --   --   --  128*  --   --  145*  APTT 54*  --    < > 55* 58* 44*  --   --   HEPARINUNFRC 1.32*  --    < >  --  0.31 0.19* 0.14*  --   CREATININE 1.44*  --   --   --  1.32*  --   --  1.43*   < > = values in this interval not displayed.    Estimated Creatinine Clearance: 45.4 mL/min (A) (by C-G formula based on SCr of 1.43 mg/dL (H)).  Assessment: Patient was on rivaroxaban for hx DVT (started July 2019), last dose 9/28. Converted to heparin drip for anticoagulation on 9/29 due to renal function.  Significant Events:  Pt has history of small cell carcinoma of lung - on chemotherapy outpatient, last cycle finished 07/10/18. Dose of pegfilgrastim charted 07/11/18. Pt has history of pancytopenia per note.  Transfused on 9/29 and 9/30  Hgb 9.5, Plt 145 are low but stable  SCr 1.4 is stable (appears to be at baseline)  Goal of Therapy:  Heparin level 0.3-0.5 units/ml APTT 66-84 seconds Monitor platelets by anticoagulation protocol: Yes   Plan:   Pt being converted from heparin drip back to home dose of rivaroxaban per MD   Heparin discontinued by MD - will initiate rivaroxaban 20 mg PO daily at time heparin discontinued.   Pharmacy to sign off heparin dosing.  Lenis Noon, PharmD Clinical Pharmacist 07/30/2018,12:11 PM

## 2018-07-30 NOTE — Progress Notes (Addendum)
[facility]  PROGRESS NOTE  Christopher Wilson DOB: Apr 30, 1941 DOA: 07/25/2018 PCP: Patient, No Pcp Per   Brief HPI- 77 y.o.malewith medical history significant ofSCLC on chemotherapy,hypertension, hyperlipidemia, GERD, CAD, CABG, stent placement, CKD-3, congenital solitary kidney, tobacco abuse, peptic ulcer disease, DVT on Xarelto, who presents with left arm swelling and pain, fever and chills. Patient says that he has been swelling and pain in the left arm for more than 3 weeks also was given 1 course of doxycycline without complete recovery.   Subjective   This morning patient feels better, MRI of the left wrist showed only degenerative changes and no abscess to be drained.  It was evaluated by orthopedics Dr. Lorin Mercy.   Assessment/Plan:     1. Left arm cellulitis-patient initially met with criteria for sepsis with leukocytosis, fever tachycardia, tachypnea.  Started on empiric antibiotics vancomycin and Rocephin.  Blood cultures showed no growth till date.  ESR was 77, CRP 12.9.  MRI of the wrist showed degenerative changes only.  Orthopedics reviewed MRI and no recommendations for surgery.  Will discontinue IV antibiotics and start on doxycycline.  2. Pulmonary edema-patient developed mild respiratory distress from IV fluids which were stopped.  Chest x-ray this morning showed no significant change as compared to prior exam.  Showed small stable pleural effusion noted.  Stable right midlung mass noted.  3. Coronary artery disease with CABG in 1989-patient had CABG in 1989 with redo CABG 453[address] vessels.  Continue atorvastatin, nitroglycerin as needed, isosorbide, metoprolol  4. CKD stage III-stable, baseline creatinine 1.5-1.8.  Today creatinine is 1.43.  Will monitor renal function closely.  5. Hyperlipidemia- continue Atorvastatin 80 mg po qhs.  6. 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  7. DVT, lower extremity- continue home Xarelto, will discontinue IV heparin        CBG: No results for input(s): GLUCAP in the last 168 hours.  CBC: Recent Labs  Lab 07/25/18 1804 07/26/18 0434 07/27/18 0502 07/28/18 0219 07/28/18 0352 07/29/18 0403 07/30/18 0516  WBC 33.7* 26.6* 19.5* 26.7*  --  13.9* 13.9*  NEUTROABS 27.7*  --  16.7* 23.5*  --  11.3* 11.1*  HGB 8.3* 7.5* 6.6* 3.5* 7.3* 9.7* 9.5*  HCT 26.4* 23.4* 20.7* 11.0* 22.2* 29.1* 29.8*  MCV 98.9 99.6 99.0 97.3  --  93.6 96.1  PLT 75* 70* 80* 138*  --  128* 145*    Basic Metabolic Panel: Recent Labs  Lab 07/26/18 0434 07/27/18 0502 07/28/18 0219 07/29/18 0403 07/30/18 0516  NA 141 141 140 137 137  K 4.0 3.8 4.1 4.2 4.2  CL 112* 113* 111 110 109  CO2 23 23 21* 21* 21*  GLUCOSE 117* 103* 126* 109* 119*  BUN _0 CREATININE 1.65* 1.59* 1.44* 1.32* 1.43*  CALCIUM 8.4* 8.4* 8.6* 8.6* 8.5*  MG  --  1.8 1.8 1.8 1.8  PHOS  --  2.5 2.2* 2.9 2.8     DVT prophylaxis: Patient on Xarelto  Code Status: Full code  Family Communication: No family at bedside  Disposition Plan: likely home when medically ready for discharge   Consultants:  None  Procedures:  None   Antibiotics:   Anti-infectives (From admission, onward)   Start     Dose/Rate Route Frequency Ordered Stop   07/30/18 2200  doxycycline (VIBRA-TABS) tablet 100 mg     100 mg Oral Every 12  hours 07/30/18 1326     07/29/18 2200  vancomycin (VANCOCIN) IVPB 1000 mg/200 mL premix  Status:  Discontinued     1,000 mg 200 mL/hr over 60 Minutes Intravenous Every 24 hours 07/29/18 1150 07/30/18 1325   07/27/18 1000  vancomycin (VANCOCIN) 1,250 mg in sodium chloride 0.9 % 250 mL IVPB  Status:  Discontinued     1,250 mg 166.7 mL/hr over 90 Minutes Intravenous Every 36 hours 07/26/18 0611 07/29/18 1150   07/25/18 2230  vancomycin (VANCOCIN) 1,500 mg in sodium chloride 0.9 % 500 mL IVPB     1,500 mg 250 mL/hr over 120  Minutes Intravenous  Once 07/25/18 2220 07/26/18 0156   07/25/18 2215  cefTRIAXone (ROCEPHIN) 2 g in sodium chloride 0.9 % 100 mL IVPB  Status:  Discontinued     2 g 200 mL/hr over 30 Minutes Intravenous Daily at bedtime 07/25/18 2211 07/30/18 1325   07/25/18 2115  clindamycin (CLEOCIN) IVPB 600 mg     600 mg 100 mL/hr over 30 Minutes Intravenous  Once 07/25/18 2103 07/25/18 2227       Objective   Vitals:   07/30/18 0514 07/30/18 0914 07/30/18 1000 07/30/18 1441  BP: 109/62  128/79 137/86  Pulse: 76  79 81  Resp: 16   15  Temp: 98 F (36.7 C)   98.3 F (36.8 C)  TempSrc: Oral   Oral  SpO2: 94% 95%  99%  Weight:      Height:        Intake/Output Summary (Last 24 hours) at 07/30/2018 1831 Last data filed at 07/30/2018 1000 Gross per 24 hour  Intake 1210.14 ml  Output 750 ml  Net 460.14 ml   Filed Weights   07/26/18 0206  Weight: 73.4 kg     Physical Examination:    General:  Appears in no acute distress  Cardiovascular: S1, S2 regular  Respiratory: Clear to auscultation bilaterally  Abdomen: Soft, nontender, no organomegaly  Extremities: No edema of the lower extremities  Neurologic: Alert, oriented x3, no focal deficit noted.     Data Reviewed: I have personally reviewed following labs and imaging studies   Recent Results (from the past 240 hour(s))  Blood culture (routine x 2)     Status: None   Collection Time: 07/25/18  6:05 PM  Result Value Ref Range Status   Specimen Description SITE NOT SPECIFIED  Final   Special Requests   Final    BOTTLES DRAWN AEROBIC AND ANAEROBIC Blood Culture results may not be optimal due to an excessive volume of blood received in culture bottles Performed at [facility] [address], [place], [place] [address]    Culture   Final    NO GROWTH 5 DAYS Performed at [facility], [place] [address], [place], [place] [address]    Report Status 07/30/2018 FINAL  Final  Blood culture (routine x  2)     Status: None   Collection Time: 07/25/18  6:17 PM  Result Value Ref Range Status   Specimen Description   Final    BLOOD RIGHT HAND Performed at [facility] [address], [place], [place] [address]    Special Requests   Final    BOTTLES DRAWN AEROBIC AND ANAEROBIC Blood Culture adequate volume Performed at [facility] [address], [place], [place] [address]    Culture   Final    NO GROWTH 5 DAYS Performed at [facility],  [address], [place], [place] [address]    Report Status 07/30/2018 FINAL  Final  Urine culture     Status: Abnormal   Collection Time: 07/25/18  7:40 PM  Result Value Ref Range Status   Specimen Description   Final    URINE, CLEAN CATCH Performed at [facility], [place] [address] [place], [place] [address]    Special Requests   Final    NONE Performed at [facility], [place] [address], [place], [place] [address]    Culture (A)  Final    <10,000 COLONIES/mL INSIGNIFICANT GROWTH Performed at [facility] [address], [place], [place] [address]    Report Status 07/27/2018 FINAL  Final     Liver Function Tests: Recent Labs  Lab 07/25/18 1804 07/27/18 0502 07/28/18 0219 07/29/18 0403 07/30/18 0516  AST 14* 10* _0 ALT _1 ALKPHOS 120 85 93 96 98  BILITOT 0.7 0.5 0.7 0.5 0.5  PROT 6.2* 5.1* 5.9* 5.9* 5.3*  ALBUMIN 3.0* 2.2* 2.6* 2.4* 2.3*   No results for input(s): LIPASE, AMYLASE in the last 168 hours. No results for input(s): AMMONIA in the last 168 hours.  Cardiac Enzymes: No results for input(s): CKTOTAL, CKMB, CKMBINDEX, TROPONINI in the last 168 hours. BNP (last 3 results) Recent Labs    03/24/18 1558 03/31/18 0830 07/26/18 0434  BNP 147.3* 104.4* 890.1*    ProBNP (last 3 results) No results for input(s): PROBNP in the last 8760 hours.    Studies: Mr Wrist Left Wo Contrast  Result Date: 07/29/2018 CLINICAL  DATA:  Ongoing wrist pain and swelling for 3 weeks. EXAM: MR OF THE LEFT WRIST WITHOUT CONTRAST TECHNIQUE: Multiplanar, multisequence MR imaging of the left wrist was performed. No intravenous contrast was administered. COMPARISON:  Hand radiographs from 07/25/2018 FINDINGS: Despite efforts by the technologist and patient, motion artifact is present on today's exam and could not be eliminated. This reduces exam sensitivity and specificity. Ligaments: Greater than expected fluid signal and separation of the scapholunate articulation. Triangular fibrocartilage: Unremarkable Tendons: Unremarkable Carpal tunnel/median nerve: Unremarkable Guyon's canal: Unremarkable Joint/cartilage: Considerable degenerative articular cartilage thinning and some degenerative spurring at the radiocarpal articulation. No overt joint effusion. There is notable spurring at the distal radioulnar joint without joint effusion. Bones/carpal alignment: Indistinct 6 mm focus of accentuated T2 signal in the volar capitate, probably a geode. Mild degenerative spurring of the first carpometacarpal articulation. Scapholunate orientation favoring dorsal intercalated segmental instability. Other: Subcutaneous edema in the wrist and distal forearm. IMPRESSION: 1. 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 2. Considerable radiocarpal osteoarthritis with associated spurring and loss of articular space. There is also osteoarthritis of the distal radioulnar joint. 3. Mild degenerative spurring of the first carpometacarpal articulation. 4. Subcutaneous edema in the distal forearm and wrist. Electronically Signed   By: Van Clines M.D.   On: 07/29/2018 10:24   Dg Chest Port 1 View  Result Date: 07/30/2018 CLINICAL DATA:  Shortness of breath. EXAM: PORTABLE CHEST 1 VIEW COMPARISON:  Radiograph of July 26, 2018. FINDINGS: Stable cardiomediastinal silhouette. Status post coronary  artery bypass graft. No pneumothorax is noted. Left lung is clear. Stable right midlung mass is noted. Stable small pleural effusion or pleural thickening is noted. Bony thorax is unremarkable. IMPRESSION: No significant change compared to prior exam. Electronically Signed   By: Marijo Conception, M.D.   On: 07/30/2018 08:29  Scheduled Meds: . atorvastatin  80 mg Oral q1800  . doxycycline  100 mg Oral Q12H  . ipratropium  0.5 mg Nebulization TID  . isosorbide mononitrate  30 mg Oral Daily  . levalbuterol  0.63 mg Nebulization TID  . metoprolol tartrate  25 mg Oral BID  . nicotine  21 mg Transdermal Daily  . rivaroxaban  20 mg Oral Q lunch      Time spent: 25 min  [facility] Pager [phone]. If 7PM-7AM, please contact night-coverage at [email or web address], Office  [phone]  password [email or web address]  07/30/2018, 6:31 PM  LOS: 4 days

## 2018-07-30 NOTE — Progress Notes (Signed)
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  No Known Allergies  Patient Measurements: Height: 5\' 10"  (177.8 cm) Weight: 161 lb 13.1 oz (73.4 kg) IBW/kg (Calculated) : 73 Heparin Dosing Weight: 73 kg  Vital Signs: Temp: 99.8 F (37.7 C) (10/01 2149) Temp Source: Oral (10/01 2149) BP: 116/51 (10/01 2149) Pulse Rate: 63 (10/01 2149)  Labs: Recent Labs    07/27/18 0502 07/28/18 0219 07/28/18 0352  07/28/18 2236 07/29/18 0403 07/29/18 1444 07/29/18 2322  HGB 6.6* 3.5* 7.3*  --   --  9.7*  --   --   HCT 20.7* 11.0* 22.2*  --   --  29.1*  --   --   PLT 80* 138*  --   --   --  128*  --   --   APTT  --  54*  --    < > 55* 58* 44*  --   HEPARINUNFRC  --  1.32*  --    < >  --  0.31 0.19* 0.14*  CREATININE 1.59* 1.44*  --   --   --  1.32*  --   --    < > = values in this interval not displayed.    Estimated Creatinine Clearance: 49.2 mL/min (A) (by C-G formula based on SCr of 1.32 mg/dL (H)).  Assessment: Patient was on rivaroxaban for hx DVT (started July 2019), last dose 9/28. Converted to heparin drip for anticoagulation on 9/29 due to renal function.  Significant Events:  Pt has history of small cell carcinoma of lung - on chemotherapy outpatient, last cycle finished 07/10/18. Dose of pegfilgrastim charted 07/11/18. Pt has history of pancytopenia per note.  Transfused on 9/29 and 9/30  SCr 1.3, CrCl ~48 mL/min - improving  Per discussion with MD, will target lower end of therapeutic range   10/1  Hgb 9.7 increased s/p transfusion on 9/30  Plt 128  APTT = 44 seconds, HL = 0.19 - both correlate in subtherapeutic range on heparin 1200 units/hr. Will begin monitoring using HL only Today, 10/2  2322 HL=0.14 below goal, no bleeding or infusion issues per RN.   Goal of Therapy:  Heparin level 0.3-0.5 units/ml APTT 66-84 seconds Monitor platelets by anticoagulation protocol: Yes   Plan:    No bolus given thrombocytopenia and recent transfusion  Increase heparin drip to 1500 units/hr  Recheck HL in 8 hours  Daily HL and CBC  Monitor for signs/syptoms of bleeding  Dorrene German, PharmD Clinical Pharmacist 07/30/2018,12:17 AM

## 2018-07-31 ENCOUNTER — Inpatient Hospital Stay: Payer: Medicare HMO

## 2018-07-31 ENCOUNTER — Telehealth: Payer: Self-pay | Admitting: Internal Medicine

## 2018-07-31 LAB — CREATININE, SERUM
Creatinine, Ser: 1.33 mg/dL — ABNORMAL HIGH (ref 0.61–1.24)
GFR calc Af Amer: 58 mL/min — ABNORMAL LOW (ref >60)
GFR calc non Af Amer: 50 mL/min — ABNORMAL LOW (ref >60)

## 2018-07-31 LAB — CBC
HEMATOCRIT: 31.4 % — AB (ref 39.0–52.0)
Hemoglobin: 10 g/dL — ABNORMAL LOW (ref 13.0–17.0)
MCH: 30.4 pg (ref 26.0–34.0)
MCHC: 31.8 g/dL (ref 30.0–36.0)
MCV: 95.4 fL (ref 78.0–100.0)
Platelets: 197 10*3/uL (ref 150–400)
RBC: 3.29 MIL/uL — AB (ref 4.22–5.81)
RDW: 17 % — ABNORMAL HIGH (ref 11.5–15.5)
WBC: 11.4 10*3/uL — ABNORMAL HIGH (ref 4.0–10.5)

## 2018-07-31 MED ORDER — RIVAROXABAN 20 MG PO TABS: 20.0000 mg | ORAL_TABLET | Freq: Every day | ORAL | 1 refills | Status: DC

## 2018-07-31 MED ORDER — DOXYCYCLINE HYCLATE 100 MG PO TABS: 100.0000 mg | ORAL_TABLET | Freq: Two times a day (BID) | ORAL | 0 refills | Status: AC

## 2018-07-31 NOTE — Progress Notes (Signed)
Patient discharged to home with family, discharge instructions reviewed with patient who verbalized understanding. New RX sent electronically to pharmacy. 

## 2018-07-31 NOTE — Telephone Encounter (Signed)
Scheduled appt per 10/3 sch message - left message with appt date and time and sent reminder letter in the mail.

## 2018-07-31 NOTE — Discharge Summary (Signed)
Physician Discharge Summary  Bren O [name] EEF:[id number] DOB: 1941/01/03 DOA: 07/25/2018  PCP: Christopher Wilson, No Pcp Per  Admit date: 07/25/2018 Discharge date: 07/31/2018  Time spent: 35* minutes  Recommendations for Outpatient Follow-up:  1. Follow up PCP in 2 weeks   Discharge Diagnoses:  Principal Problem:   Cellulitis of left arm Active Problems:   CAD (coronary artery disease), with CABG 1989 and re-do Cabg 2002 X 3 vesssels.  progressive disease with Stent to Lt. 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 Christopher Wilson says that he has been having swelling and pain in the left arm for more than 3 weeks also was given 1 course of doxycycline without complete recovery.  Hospital Course:   1. Left arm cellulitis-Christopher Wilson initially met with criteria for sepsis with leukocytosis, fever tachycardia, tachypnea.  Started on empiric antibiotics vancomycin and Rocephin.  Blood cultures showed no growth till date.  ESR was 77, CRP 12.9.  MRI of the wrist showed degenerative changes only.  Orthopedics reviewed MRI and no recommendations for surgery.  IV antibiotics were discntinued and started on doxycycline.  2. Pulmonary edema-Christopher Wilson developed mild  respiratory distress from IV fluids which were stopped.  Chest x-ray this morning showed no significant change as compared to prior exam.  Showed small stable pleural effusion .  Stable right midlung mass noted.  3. Coronary artery disease with CABG in 1989-Christopher Wilson had CABG in 1989 with redo CABG 88325 vessels.  Continue atorvastatin, nitroglycerin as needed, isosorbide, metoprolol  4. CKD stage III-stable, baseline creatinine 1.5-1.8.  Today creatinine is 1.33.    5. Hyperlipidemia- continue Atorvastatin 80 mg po qhs.  6. 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  7. DVT, lower extremity- continue home Xarelto  Procedures:  None   Consultations:  None   Discharge Exam: Vitals:   07/31/18 0553 07/31/18 0736  BP: 128/82   Pulse: 74   Resp: 16   Temp: 98.3 F (36.8 C)   SpO2:  95%    General: Appears in no acute distress Cardiovascular: s1s2 RRR Respiratory: Clear bilaterally  Discharge Instructions   Discharge Instructions    Diet - low sodium heart healthy   Complete by:  As directed    Increase activity slowly   Complete by:  As directed      Allergies as of 07/31/2018   No Known Allergies     Medication List    STOP taking these medications   amoxicillin-clavulanate 500-125 MG tablet Commonly known as:  AUGMENTIN   furosemide 40 MG tablet Commonly known as:  LASIX     TAKE these medications   acetaminophen 325 MG tablet Commonly known  as:  TYLENOL Take 650 mg by mouth every 6 (six) hours as needed for moderate pain.   albuterol 108 (90 Base) MCG/ACT inhaler Commonly known as:  PROVENTIL HFA;VENTOLIN HFA Inhale 2 puffs into the lungs every 6 (six) hours as needed for wheezing or shortness of breath.   atorvastatin 80 MG tablet Commonly known as:  LIPITOR TAKE ONE TABLET BY MOUTH DAILY AT 6 P.M. NEEDS APPOINTMENT FOR FUTURE REFILLS. What changed:    how  much to take  how to take this  when to take this  additional instructions   doxycycline 100 MG tablet Commonly known as:  VIBRA-TABS Take 1 tablet (100 mg total) by mouth every 12 (twelve) hours for 5 days. What changed:  when to take this   isosorbide mononitrate 30 MG 24 hr tablet Commonly known as:  IMDUR Take 1 tablet (30 mg total) by mouth daily.   metoprolol tartrate 25 MG tablet Commonly known as:  LOPRESSOR Take 1 tablet (25 mg total) by mouth 2 (two) times daily.   nitroGLYCERIN 0.4 MG SL tablet Commonly known as:  NITROSTAT Place 1 tablet (0.4 mg total) under the tongue every 5 (five) minutes as needed for chest pain.   prochlorperazine 10 MG tablet Commonly known as:  COMPAZINE Take 1 tablet (10 mg total) by mouth every 6 (six) hours as needed for nausea or vomiting.   rivaroxaban 20 MG Tabs tablet Commonly known as:  XARELTO Take 1 tablet (20 mg total) by mouth daily with supper. What changed:  Another medication with the same name was removed. Continue taking this medication, and follow the directions you see here.      No Known Allergies Follow-up Information    Troy Sine, MD Follow up in 2 week(s).   Specialty:  Cardiology Contact information: [address] [place] [address] [phone]            The results of significant diagnostics from this hospitalization (including imaging, microbiology, ancillary and laboratory) are listed below for reference.    Significant Diagnostic Studies: Dg Chest 2 View  Result Date: 07/25/2018 CLINICAL DATA:  Fever. EXAM: CHEST - 2 VIEW COMPARISON:  Radiographs of June 19, 2018. FINDINGS: Stable cardiomediastinal silhouette. Status post coronary artery bypass graft. No pneumothorax is noted. Left lung is clear. Stable right midlung mass is noted consistent with malignancy. Stable right lower lobe scarring is noted. Bony thorax is unremarkable. IMPRESSION: No significant change compared  to prior exam. Electronically Signed   By: Marijo Conception, M.D.   On: 07/25/2018 19:01   Dg Forearm Left  Result Date: 07/25/2018 CLINICAL DATA:  Left hand and arm swelling. EXAM: LEFT FOREARM - 2 VIEW COMPARISON:  None. FINDINGS: There is no evidence of fracture or other focal bone lesions. Soft tissues are unremarkable. IMPRESSION: Normal left forearm. Electronically Signed   By: Marijo Conception, M.D.   On: 07/25/2018 18:58   Dg Hand 2 View Left  Result Date: 07/25/2018 CLINICAL DATA:  Acute left hand pain and swelling. EXAM: LEFT HAND - 2 VIEW COMPARISON:  None. FINDINGS: There is no evidence of fracture or dislocation. Narrowing of radiocarpal joint is noted consistent with degenerative joint disease. No lytic destruction is seen to suggest osteomyelitis. Soft tissues are unremarkable. IMPRESSION: No acute abnormality seen in the left hand. Electronically Signed   By: Marijo Conception, M.D.   On: 07/25/2018 18:59   Mr Wrist Left Wo Contrast  Result Date: 07/29/2018 CLINICAL  DATA:  Ongoing wrist pain and swelling for 3 weeks. EXAM: MR OF THE LEFT WRIST WITHOUT CONTRAST TECHNIQUE: Multiplanar, multisequence MR imaging of the left wrist was performed. No intravenous contrast was administered. COMPARISON:  Hand radiographs from 07/25/2018 FINDINGS: Despite efforts by the technologist and Christopher Wilson, motion artifact is present on today's exam and could not be eliminated. This reduces exam sensitivity and specificity. Ligaments: Greater than expected fluid signal and separation of the scapholunate articulation. Triangular fibrocartilage: Unremarkable Tendons: Unremarkable Carpal tunnel/median nerve: Unremarkable Guyon's canal: Unremarkable Joint/cartilage: Considerable degenerative articular cartilage thinning and some degenerative spurring at the radiocarpal articulation. No overt joint effusion. There is notable spurring at the distal radioulnar joint without joint effusion. Bones/carpal alignment:  Indistinct 6 mm focus of accentuated T2 signal in the volar capitate, probably a geode. Mild degenerative spurring of the first carpometacarpal articulation. Scapholunate orientation favoring dorsal intercalated segmental instability. Other: Subcutaneous edema in the wrist and distal forearm. IMPRESSION: 1. 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 2. Considerable radiocarpal osteoarthritis with associated spurring and loss of articular space. There is also osteoarthritis of the distal radioulnar joint. 3. Mild degenerative spurring of the first carpometacarpal articulation. 4. Subcutaneous edema in the distal forearm and wrist. Electronically Signed   By: Van Clines M.D.   On: 07/29/2018 10:24   Dg Chest Port 1 View  Result Date: 07/30/2018 CLINICAL DATA:  Shortness of breath. EXAM: PORTABLE CHEST 1 VIEW COMPARISON:  Radiograph of July 26, 2018. FINDINGS: Stable cardiomediastinal silhouette. Status post coronary artery bypass graft. No pneumothorax is noted. Left lung is clear. Stable right midlung mass is noted. Stable small pleural effusion or pleural thickening is noted. Bony thorax is unremarkable. IMPRESSION: No significant change compared to prior exam. Electronically Signed   By: Marijo Conception, M.D.   On: 07/30/2018 08:29   Dg Chest Port 1 View  Result Date: 07/26/2018 CLINICAL DATA:  Shortness of breath.  History of lung cancer. EXAM: PORTABLE CHEST 1 VIEW COMPARISON:  July 25, 2018 FINDINGS: No pneumothorax. The known mass in the right mid lung is stable. Pleural thickening versus a small right pleural effusion. No new infiltrate. The cardiomediastinal silhouette is stable. IMPRESSION: 1. The known right midlung mass is stable. Pleural thickening versus tiny right effusion, also unchanged. No interval changes. Electronically Signed   By: Dorise Bullion III M.D   On: 07/26/2018 17:46    Microbiology: Recent  Results (from the past 240 hour(s))  Blood culture (routine x 2)     Status: None   Collection Time: 07/25/18  6:05 PM  Result Value Ref Range Status   Specimen Description SITE NOT SPECIFIED  Final   Special Requests   Final    BOTTLES DRAWN AEROBIC AND ANAEROBIC Blood Culture results may not be optimal due to an excessive volume of blood received in culture bottles Performed at [facility], [place] [address] [place], [place] [address]    Culture   Final    NO GROWTH 5 DAYS Performed at [facility], [place] [address], [place], [place] [address]    Report Status 07/30/2018 FINAL  Final  Blood culture (routine x 2)     Status: None   Collection Time: 07/25/18  6:17 PM  Result Value Ref Range Status   Specimen Description   Final    BLOOD RIGHT HAND Performed at [facility] [address], [place], [place] [address]    Special Requests  Final    BOTTLES DRAWN AEROBIC AND ANAEROBIC Blood Culture adequate volume Performed at [facility] [address] [place], [place] [address]    Culture   Final    NO GROWTH 5 DAYS Performed at [facility], [place] [address], [place], [place] [address]    Report Status 07/30/2018 FINAL  Final  Urine culture     Status: Abnormal   Collection Time: 07/25/18  7:40 PM  Result Value Ref Range Status   Specimen Description   Final    URINE, CLEAN CATCH Performed at [facility], [place] [address] [place], [place] [address]    Special Requests   Final    NONE Performed at [facility], [place] [address], [place], [place] [address]    Culture (A)  Final    <10,000 COLONIES/mL INSIGNIFICANT GROWTH Performed at [facility] [address], [place], [place] [address]    Report Status 07/27/2018 FINAL  Final     Labs: Basic Metabolic Panel: Recent Labs  Lab 07/26/18 0434 07/27/18 0502 07/28/18 0219 07/29/18 0403 07/30/18 0516  07/31/18 0546  NA 141 141 140 137 137  --   K 4.0 3.8 4.1 4.2 4.2  --   CL 112* 113* 111 110 109  --   CO2 23 23 21* 21* 21*  --   GLUCOSE 117* 103* 126* 109* 119*  --   BUN _0 --   CREATININE 1.65* 1.59* 1.44* 1.32* 1.43* 1.33*  CALCIUM 8.4* 8.4* 8.6* 8.6* 8.5*  --   MG  --  1.8 1.8 1.8 1.8  --   PHOS  --  2.5 2.2* 2.9 2.8  --    Liver Function Tests: Recent Labs  Lab 07/25/18 1804 07/27/18 0502 07/28/18 0219 07/29/18 0403 07/30/18 0516  AST 14* 10* _1 ALT _2 ALKPHOS 120 85 93 96 98  BILITOT 0.7 0.5 0.7 0.5 0.5  PROT 6.2* 5.1* 5.9* 5.9* 5.3*  ALBUMIN 3.0* 2.2* 2.6* 2.4* 2.3*   No results for input(s): LIPASE, AMYLASE in the last 168 hours. No results for input(s): AMMONIA in the last 168 hours. CBC: Recent Labs  Lab 07/25/18 1804  07/27/18 0502 07/28/18 0219 07/28/18 0352 07/29/18 0403 07/30/18 0516 07/31/18 0546  WBC 33.7*   < > 19.5* 26.7*  --  13.9* 13.9* 11.4*  NEUTROABS 27.7*  --  16.7* 23.5*  --  11.3* 11.1*  --   HGB 8.3*   < > 6.6* 3.5* 7.3* 9.7* 9.5* 10.0*  HCT 26.4*   < > 20.7* 11.0* 22.2* 29.1* 29.8* 31.4*  MCV 98.9   < > 99.0 97.3  --  93.6 96.1 95.4  PLT 75*   < > 80* 138*  --  128* 145* 197   < > = values in this interval not displayed.   Cardiac Enzymes: No results for input(s): CKTOTAL, CKMB, CKMBINDEX, TROPONINI in the last 168 hours. BNP: BNP (last 3 results) Recent Labs    03/24/18 1558 03/31/18 0830 07/26/18 0434  BNP 147.3* 104.4* 890.1*    ProBNP (last 3 results) No results for input(s): PROBNP in the last 8760 hours.  CBG: No results for input(s): GLUCAP in the last 168 hours.     Signed:  Oswald Hillock MD.  [facility] 07/31/2018, 11:25 AM

## 2018-08-01 ENCOUNTER — Telehealth: Payer: Self-pay | Admitting: Internal Medicine

## 2018-08-01 ENCOUNTER — Telehealth: Payer: Self-pay

## 2018-08-01 ENCOUNTER — Ambulatory Visit: Payer: Medicare HMO

## 2018-08-01 NOTE — Telephone Encounter (Signed)
New message    Just an FYI. We have made several attempts to contact this patient including sending a letter to schedule or reschedule their echocardiogram. We will be removing the patient from the echo WQ.   Thank you 

## 2018-08-01 NOTE — Telephone Encounter (Signed)
Printed medical records for [employer], Release ID: [id number]

## 2018-08-02 ENCOUNTER — Ambulatory Visit: Payer: Medicare HMO

## 2018-08-05 ENCOUNTER — Other Ambulatory Visit: Payer: Self-pay

## 2018-08-05 ENCOUNTER — Inpatient Hospital Stay: Payer: Medicare HMO

## 2018-08-05 DIAGNOSIS — R0609 Other forms of dyspnea: Principal | ICD-10-CM

## 2018-08-05 DIAGNOSIS — I10 Essential (primary) hypertension: Secondary | ICD-10-CM

## 2018-08-05 NOTE — Progress Notes (Signed)
FMLA successfully faxed to [facility] at [phone]. Mailed copy to patient address on file.

## 2018-08-08 ENCOUNTER — Inpatient Hospital Stay: Payer: Medicare HMO

## 2018-08-08 ENCOUNTER — Telehealth: Payer: Self-pay

## 2018-08-08 ENCOUNTER — Inpatient Hospital Stay: Payer: Medicare HMO | Attending: Internal Medicine | Admitting: Internal Medicine

## 2018-08-08 ENCOUNTER — Encounter: Payer: Self-pay | Admitting: Internal Medicine

## 2018-08-08 VITALS — BP 145/90 | HR 89 | Temp 97.7°F | Resp 18 | Ht 70.0 in | Wt 155.2 lb

## 2018-08-08 DIAGNOSIS — R5382 Chronic fatigue, unspecified: Secondary | ICD-10-CM

## 2018-08-08 DIAGNOSIS — N189 Chronic kidney disease, unspecified: Secondary | ICD-10-CM

## 2018-08-08 DIAGNOSIS — I129 Hypertensive chronic kidney disease with stage 1 through stage 4 chronic kidney disease, or unspecified chronic kidney disease: Secondary | ICD-10-CM | POA: Diagnosis not present

## 2018-08-08 DIAGNOSIS — Z5112 Encounter for antineoplastic immunotherapy: Secondary | ICD-10-CM | POA: Diagnosis not present

## 2018-08-08 DIAGNOSIS — C3490 Malignant neoplasm of unspecified part of unspecified bronchus or lung: Secondary | ICD-10-CM

## 2018-08-08 DIAGNOSIS — J910 Malignant pleural effusion: Secondary | ICD-10-CM | POA: Diagnosis not present

## 2018-08-08 DIAGNOSIS — C342 Malignant neoplasm of middle lobe, bronchus or lung: Secondary | ICD-10-CM | POA: Diagnosis not present

## 2018-08-08 DIAGNOSIS — C3411 Malignant neoplasm of upper lobe, right bronchus or lung: Secondary | ICD-10-CM

## 2018-08-08 LAB — CBC WITH DIFFERENTIAL (CANCER CENTER ONLY)
Abs Immature Granulocytes: 0.06 10*3/uL (ref 0.00–0.07)
BASOS PCT: 2 %
Basophils Absolute: 0.1 10*3/uL (ref 0.0–0.1)
EOS ABS: 0.1 10*3/uL (ref 0.0–0.5)
EOS PCT: 2 %
HCT: 37.8 % — ABNORMAL LOW (ref 39.0–52.0)
Hemoglobin: 12 g/dL — ABNORMAL LOW (ref 13.0–17.0)
Immature Granulocytes: 1 %
Lymphocytes Relative: 15 %
Lymphs Abs: 1 10*3/uL (ref 0.7–4.0)
MCH: 30.9 pg (ref 26.0–34.0)
MCHC: 31.7 g/dL (ref 30.0–36.0)
MCV: 97.4 fL (ref 80.0–100.0)
MONO ABS: 0.9 10*3/uL (ref 0.1–1.0)
MONOS PCT: 12 %
NEUTROS PCT: 68 %
Neutro Abs: 4.9 10*3/uL (ref 1.7–7.7)
PLATELETS: 294 10*3/uL (ref 150–400)
RBC: 3.88 MIL/uL — ABNORMAL LOW (ref 4.22–5.81)
RDW: 16.1 % — ABNORMAL HIGH (ref 11.5–15.5)
WBC Count: 7 10*3/uL (ref 4.0–10.5)
nRBC: 0 % (ref 0.0–0.2)

## 2018-08-08 LAB — CMP (CANCER CENTER ONLY)
ALBUMIN: 2.9 g/dL — AB (ref 3.5–5.0)
ALK PHOS: 120 U/L (ref 38–126)
ALT: 10 U/L (ref 0–44)
ANION GAP: 8 (ref 5–15)
AST: 14 U/L — ABNORMAL LOW (ref 15–41)
BILIRUBIN TOTAL: 0.5 mg/dL (ref 0.3–1.2)
BUN: 23 mg/dL (ref 8–23)
CALCIUM: 10.3 mg/dL (ref 8.9–10.3)
CO2: 27 mmol/L (ref 22–32)
CREATININE: 1.73 mg/dL — AB (ref 0.61–1.24)
Chloride: 105 mmol/L (ref 98–111)
GFR, Est AFR Am: 42 mL/min — ABNORMAL LOW (ref >60)
GFR, Estimated: 37 mL/min — ABNORMAL LOW (ref >60)
Glucose, Bld: 75 mg/dL (ref 70–99)
Potassium: 4.6 mmol/L (ref 3.5–5.1)
Sodium: 140 mmol/L (ref 135–145)
TOTAL PROTEIN: 7.6 g/dL (ref 6.5–8.1)

## 2018-08-08 LAB — TSH: TSH: 0.589 u[IU]/mL (ref 0.320–4.118)

## 2018-08-08 NOTE — Progress Notes (Signed)
[place] [phone]   [phone]  OFFICE PROGRESS NOTE  Patient, No Pcp Per No address on file  DIAGNOSIS: Extensive stage (T4, N2, M1a) small cell lung cancer presented with large right perihilar mass with mediastinal invasion and loculated malignant right pleural effusion diagnosed in June 2019.  PRIOR THERAPY:None.  CURRENT THERAPY: Systemic chemotherapy with carboplatin for AUC of 5 on day 1, etoposide 100 mg/M2 on days 1, 2 and 3 in addition to Tecentriq 1200 mg IV every 3 weeks with Neulasta support.  Status post 5 cycles.  Starting from cycle #6 the patient will be treated with maintenance single agent Tecentriq every 3 weeks  INTERVAL HISTORY: Christopher Wilson 77 y.o. male returns to the clinic today for follow-up visit accompanied by his daughter.  The patient is feeling fine today with no concerning complaints except for mild fatigue.  He was recently admitted to [facility] with pancytopenia and cellulitis of the left arm.  He was treated with a course of antibiotics and felt much better.  He denied having any current chest pain but has shortness of breath with exertion with no cough or hemoptysis.  He denied having any fever or chills.  He has no nausea, vomiting, diarrhea or constipation.  He denied having any headache or visual changes.  The patient is here today for evaluation before resuming his treatment with immunotherapy.  MEDICAL HISTORY: Past Medical History:  Diagnosis Date  . ACS (acute coronary syndrome), with ST elevation but stable coronary arteries on cath 06/27/12 06/27/2012  . CAD (coronary artery disease), with CABG 1989 and re-do Cabg 2002 X 3 vesssels.  progressive disease with Stent to Lt. Main, and 1st diag 2006, stent to VG to LCX 2007, with known occlusion history o  06/27/2012  . CKD (chronic kidney disease) stage 3, GFR 30-59 ml/min (HCC) 06/27/2012  . 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 Coronary artery  disease   . GERD (gastroesophageal reflux disease)   . Hyperlipidemia LDL goal < 70 06/27/2012  . Hypertension   . Myocardial infarction ([place])   . Peptic ulcer   . STEMI (ST elevation myocardial infarction) ([place]) 06/27/2012  . Tobacco abuse 06/27/2012    ALLERGIES:  has No Known Allergies.  MEDICATIONS:  Current Outpatient Medications  Medication Sig Dispense Refill  . atorvastatin (LIPITOR) 80 MG tablet TAKE ONE TABLET BY MOUTH DAILY AT 6 P.M. NEEDS APPOINTMENT FOR FUTURE REFILLS. (Patient taking differently: Take 80 mg by mouth daily at 6 PM. ) 90 tablet 3  . isosorbide mononitrate (IMDUR) 30 MG 24 hr tablet Take 1 tablet (30 mg total) by mouth daily. 90 tablet 3  . metoprolol tartrate (LOPRESSOR) 25 MG tablet Take 1 tablet (25 mg total) by mouth 2 (two) times daily. 60 tablet 3  . rivaroxaban (XARELTO) 20 MG TABS tablet Take 1 tablet (20 mg total) by mouth daily with supper. 30 tablet 1  . acetaminophen (TYLENOL) 325 MG tablet Take 650 mg by mouth every 6 (six) hours as needed for moderate pain.    Marland Kitchen albuterol (PROVENTIL HFA;VENTOLIN HFA) 108 (90 Base) MCG/ACT inhaler Inhale 2 puffs into the lungs every 6 (six) hours as needed for wheezing or shortness of breath.    . nitroGLYCERIN (NITROSTAT) 0.4 MG SL tablet Place 1 tablet (0.4 mg total) under the tongue every 5 (five) minutes as needed for chest pain. (Patient not taking: Reported on 08/08/2018) 25 tablet 3  . prochlorperazine (COMPAZINE) 10  MG tablet Take 1 tablet (10 mg total) by mouth every 6 (six) hours as needed for nausea or vomiting. (Patient not taking: Reported on 05/27/2018) 30 tablet 0   No current facility-administered medications for this visit.     SURGICAL HISTORY:  Past Surgical History:  Procedure Laterality Date  . coratid arterty surgery    . CORONARY ANGIOPLASTY WITH STENT PLACEMENT    . CORONARY ARTERY BYPASS GRAFT    . IR GUIDED DRAIN W CATHETER PLACEMENT  04/08/2018  . IR REMOVAL OF PLURAL CATH W/CUFF   06/19/2018  . LCEA    . LEFT HEART CATH N/A 06/27/2012   Procedure: LEFT HEART CATH;  Surgeon: Troy Sine, MD;  Location: [facility] CATH LAB;  Service: Cardiovascular;  Laterality: N/A;  . triple bipass     Hx of CABG and RE do  . VASCULAR SURGERY    . VIDEO BRONCHOSCOPY Bilateral 04/07/2018   Procedure: VIDEO BRONCHOSCOPY WITHOUT FLUORO;  Surgeon: Rush Farmer, MD;  Location: [facility] ENDOSCOPY;  Service: Cardiopulmonary;  Laterality: Bilateral;    REVIEW OF SYSTEMS:  Constitutional: positive for fatigue Eyes: negative Ears, nose, mouth, throat, and face: negative Respiratory: positive for dyspnea on exertion Cardiovascular: negative Gastrointestinal: negative Genitourinary:negative Integument/breast: negative Hematologic/lymphatic: negative Musculoskeletal:negative Neurological: negative Behavioral/Psych: negative Endocrine: negative Allergic/Immunologic: negative   PHYSICAL EXAMINATION: General appearance: alert, cooperative, fatigued and no distress Head: Normocephalic, without obvious abnormality, atraumatic Neck: no adenopathy, no JVD, supple, symmetrical, trachea midline and thyroid not enlarged, symmetric, no tenderness/mass/nodules Lymph nodes: Cervical, supraclavicular, and axillary nodes normal. Resp: clear to auscultation bilaterally Back: symmetric, no curvature. ROM normal. No CVA tenderness. Cardio: regular rate and rhythm, S1, S2 normal, no murmur, click, rub or gallop GI: soft, non-tender; bowel sounds normal; no masses,  no organomegaly Extremities: extremities normal, atraumatic, no cyanosis or edema Neurologic: Alert and oriented X 3, normal strength and tone. Normal symmetric reflexes. Normal coordination and gait  ECOG PERFORMANCE STATUS: 1 - Symptomatic but completely ambulatory  Blood pressure (!) 145/90, pulse 89, temperature 97.7 F (36.5 C), temperature source Oral, resp. rate 18, height 5\' 10"  (1.778 m), weight 155 lb 3.2 oz (70.4 kg), SpO2 98  %.  LABORATORY DATA: Lab Results  Component Value Date   WBC 7.0 08/08/2018   HGB 12.0 (L) 08/08/2018   HCT 37.8 (L) 08/08/2018   MCV 97.4 08/08/2018   PLT 294 08/08/2018      Chemistry      Component Value Date/Time   NA 140 08/08/2018 0847   NA 140 03/21/2018 1048   K 4.6 08/08/2018 0847   CL 105 08/08/2018 0847   CO2 27 08/08/2018 0847   BUN 23 08/08/2018 0847   BUN 45 (H) 03/21/2018 1048   CREATININE 1.73 (H) 08/08/2018 0847   CREATININE 1.80 (H) 09/06/2014 1033      Component Value Date/Time   CALCIUM 10.3 08/08/2018 0847   ALKPHOS 120 08/08/2018 0847   AST 14 (L) 08/08/2018 0847   ALT 10 08/08/2018 0847   BILITOT 0.5 08/08/2018 0847       RADIOGRAPHIC STUDIES: Dg Chest 2 View  Result Date: 07/25/2018 CLINICAL DATA:  Fever. EXAM: CHEST - 2 VIEW COMPARISON:  Radiographs of June 19, 2018. FINDINGS: Stable cardiomediastinal silhouette. Status post coronary artery bypass graft. No pneumothorax is noted. Left lung is clear. Stable right midlung mass is noted consistent with malignancy. Stable right lower lobe scarring is noted. Bony thorax is unremarkable. IMPRESSION: No significant change compared to prior exam. Electronically  Signed   By: Marijo Conception, M.D.   On: 07/25/2018 19:01   Dg Forearm Left  Result Date: 07/25/2018 CLINICAL DATA:  Left hand and arm swelling. EXAM: LEFT FOREARM - 2 VIEW COMPARISON:  None. FINDINGS: There is no evidence of fracture or other focal bone lesions. Soft tissues are unremarkable. IMPRESSION: Normal left forearm. Electronically Signed   By: Marijo Conception, M.D.   On: 07/25/2018 18:58   Dg Hand 2 View Left  Result Date: 07/25/2018 CLINICAL DATA:  Acute left hand pain and swelling. EXAM: LEFT HAND - 2 VIEW COMPARISON:  None. FINDINGS: There is no evidence of fracture or dislocation. Narrowing of radiocarpal joint is noted consistent with degenerative joint disease. No lytic destruction is seen to suggest osteomyelitis. Soft tissues  are unremarkable. IMPRESSION: No acute abnormality seen in the left hand. Electronically Signed   By: Marijo Conception, M.D.   On: 07/25/2018 18:59   Mr Wrist Left Wo Contrast  Result Date: 07/29/2018 CLINICAL DATA:  Ongoing wrist pain and swelling for 3 weeks. EXAM: MR OF THE LEFT WRIST WITHOUT CONTRAST TECHNIQUE: Multiplanar, multisequence MR imaging of the left wrist was performed. No intravenous contrast was administered. COMPARISON:  Hand radiographs from 07/25/2018 FINDINGS: Despite efforts by the technologist and patient, motion artifact is present on today's exam and could not be eliminated. This reduces exam sensitivity and specificity. Ligaments: Greater than expected fluid signal and separation of the scapholunate articulation. Triangular fibrocartilage: Unremarkable Tendons: Unremarkable Carpal tunnel/median nerve: Unremarkable Guyon's canal: Unremarkable Joint/cartilage: Considerable degenerative articular cartilage thinning and some degenerative spurring at the radiocarpal articulation. No overt joint effusion. There is notable spurring at the distal radioulnar joint without joint effusion. Bones/carpal alignment: Indistinct 6 mm focus of accentuated T2 signal in the volar capitate, probably a geode. Mild degenerative spurring of the first carpometacarpal articulation. Scapholunate orientation favoring dorsal intercalated segmental instability. Other: Subcutaneous edema in the wrist and distal forearm. IMPRESSION: 1. 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 2. Considerable radiocarpal osteoarthritis with associated spurring and loss of articular space. There is also osteoarthritis of the distal radioulnar joint. 3. Mild degenerative spurring of the first carpometacarpal articulation. 4. Subcutaneous edema in the distal forearm and wrist. Electronically Signed   By: Van Clines M.D.   On: 07/29/2018 10:24   Dg Chest Port 1  View  Result Date: 07/30/2018 CLINICAL DATA:  Shortness of breath. EXAM: PORTABLE CHEST 1 VIEW COMPARISON:  Radiograph of July 26, 2018. FINDINGS: Stable cardiomediastinal silhouette. Status post coronary artery bypass graft. No pneumothorax is noted. Left lung is clear. Stable right midlung mass is noted. Stable small pleural effusion or pleural thickening is noted. Bony thorax is unremarkable. IMPRESSION: No significant change compared to prior exam. Electronically Signed   By: Marijo Conception, M.D.   On: 07/30/2018 08:29   Dg Chest Port 1 View  Result Date: 07/26/2018 CLINICAL DATA:  Shortness of breath.  History of lung cancer. EXAM: PORTABLE CHEST 1 VIEW COMPARISON:  July 25, 2018 FINDINGS: No pneumothorax. The known mass in the right mid lung is stable. Pleural thickening versus a small right pleural effusion. No new infiltrate. The cardiomediastinal silhouette is stable. IMPRESSION: 1. The known right midlung mass is stable. Pleural thickening versus tiny right effusion, also unchanged. No interval changes. Electronically Signed   By: Dorise Bullion III M.D   On: 07/26/2018 17:46    ASSESSMENT AND PLAN: This is a very pleasant 77 years old  white male recently diagnosed with extensive stage small cell lung cancer and currently undergoing systemic chemotherapy with carboplatin, etoposide and Tecentriq status post 5 cycles. The patient was tolerating his treatment well except for pancytopenia and recent treatment with cellulitis of the left arm. He is feeling much better today. I recommended for him to resume his treatment but with maintenance single agent Tecentriq every 3 weeks.  He is expected to start the first cycle of this treatment next week. I will see him back for follow-up visit in around 4 weeks for evaluation after repeating CT scan of the chest, abdomen and pelvis for restaging of his disease. For hypertension he was advised to take his blood pressure medication as  prescribed and to monitor it closely at home. For the renal insufficiency, I strongly encouraged the patient to increase his oral hydration. He was advised to call immediately if he has any concerning symptoms in the interval. The patient voices understanding of current disease status and treatment options and is in agreement with the current care plan.  All questions were answered. The patient knows to call the clinic with any problems, questions or concerns. We can certainly see the patient much sooner if necessary.  Disclaimer: This note was dictated with voice recognition software. Similar sounding words can inadvertently be transcribed and may not be corrected upon review.

## 2018-08-08 NOTE — Telephone Encounter (Signed)
Printed avs and calender of upcoming appointment. Per 10/11 los 

## 2018-08-12 ENCOUNTER — Inpatient Hospital Stay: Payer: Medicare HMO

## 2018-08-12 VITALS — BP 158/89 | HR 98 | Temp 97.9°F | Resp 18

## 2018-08-12 DIAGNOSIS — C3411 Malignant neoplasm of upper lobe, right bronchus or lung: Secondary | ICD-10-CM

## 2018-08-12 DIAGNOSIS — Z5112 Encounter for antineoplastic immunotherapy: Secondary | ICD-10-CM | POA: Diagnosis not present

## 2018-08-12 DIAGNOSIS — C342 Malignant neoplasm of middle lobe, bronchus or lung: Secondary | ICD-10-CM | POA: Diagnosis not present

## 2018-08-12 DIAGNOSIS — N189 Chronic kidney disease, unspecified: Secondary | ICD-10-CM | POA: Diagnosis not present

## 2018-08-12 DIAGNOSIS — J910 Malignant pleural effusion: Secondary | ICD-10-CM | POA: Diagnosis not present

## 2018-08-12 DIAGNOSIS — I129 Hypertensive chronic kidney disease with stage 1 through stage 4 chronic kidney disease, or unspecified chronic kidney disease: Secondary | ICD-10-CM | POA: Diagnosis not present

## 2018-08-12 LAB — CMP (CANCER CENTER ONLY)
ALBUMIN: 2.9 g/dL — AB (ref 3.5–5.0)
ALK PHOS: 118 U/L (ref 38–126)
ALT: 9 U/L (ref 0–44)
ANION GAP: 10 (ref 5–15)
AST: 12 U/L — ABNORMAL LOW (ref 15–41)
BILIRUBIN TOTAL: 0.5 mg/dL (ref 0.3–1.2)
BUN: 31 mg/dL — AB (ref 8–23)
CALCIUM: 10 mg/dL (ref 8.9–10.3)
CO2: 25 mmol/L (ref 22–32)
CREATININE: 1.87 mg/dL — AB (ref 0.61–1.24)
Chloride: 107 mmol/L (ref 98–111)
GFR, Est AFR Am: 39 mL/min — ABNORMAL LOW (ref >60)
GFR, Estimated: 33 mL/min — ABNORMAL LOW (ref >60)
GLUCOSE: 97 mg/dL (ref 70–99)
Potassium: 4.6 mmol/L (ref 3.5–5.1)
Sodium: 142 mmol/L (ref 135–145)
TOTAL PROTEIN: 7.2 g/dL (ref 6.5–8.1)

## 2018-08-12 LAB — CBC WITH DIFFERENTIAL (CANCER CENTER ONLY)
ABS IMMATURE GRANULOCYTES: 0.04 10*3/uL (ref 0.00–0.07)
BASOS ABS: 0.1 10*3/uL (ref 0.0–0.1)
BASOS PCT: 1 %
EOS PCT: 3 %
Eosinophils Absolute: 0.3 10*3/uL (ref 0.0–0.5)
HEMATOCRIT: 36.1 % — AB (ref 39.0–52.0)
HEMOGLOBIN: 11.2 g/dL — AB (ref 13.0–17.0)
Immature Granulocytes: 0 %
LYMPHS PCT: 9 %
Lymphs Abs: 0.9 10*3/uL (ref 0.7–4.0)
MCH: 30.5 pg (ref 26.0–34.0)
MCHC: 31 g/dL (ref 30.0–36.0)
MCV: 98.4 fL (ref 80.0–100.0)
Monocytes Absolute: 0.9 10*3/uL (ref 0.1–1.0)
Monocytes Relative: 8 %
NRBC: 0 % (ref 0.0–0.2)
Neutro Abs: 8.4 10*3/uL — ABNORMAL HIGH (ref 1.7–7.7)
Neutrophils Relative %: 79 %
Platelet Count: 243 10*3/uL (ref 150–400)
RBC: 3.67 MIL/uL — AB (ref 4.22–5.81)
RDW: 15.9 % — ABNORMAL HIGH (ref 11.5–15.5)
WBC: 10.6 10*3/uL — AB (ref 4.0–10.5)

## 2018-08-12 MED ORDER — SODIUM CHLORIDE 0.9 % IV SOLN
Freq: Once | INTRAVENOUS | Status: AC
  Administered 2018-08-12: 13:00:00 via INTRAVENOUS
  Filled 2018-08-12: qty 250

## 2018-08-12 MED ORDER — SODIUM CHLORIDE 0.9 % IV SOLN
1200.0000 mg | Freq: Once | INTRAVENOUS | Status: AC
  Administered 2018-08-12: 1200 mg via INTRAVENOUS
  Filled 2018-08-12: qty 20

## 2018-08-12 NOTE — Progress Notes (Signed)
Ok to treat with Scr 1.87 per Dr. Julien Nordmann

## 2018-08-12 NOTE — Patient Instructions (Signed)
[place] Discharge Instructions for Patients Receiving Chemotherapy  Today you received the following chemotherapy agents tecentriq  To help prevent nausea and vomiting after your treatment, we encourage you to take your nausea medication as directed  If you develop nausea and vomiting that is not controlled by your nausea medication, call the clinic.   BELOW ARE SYMPTOMS THAT SHOULD BE REPORTED IMMEDIATELY:  *FEVER GREATER THAN 100.5 F  *CHILLS WITH OR WITHOUT FEVER  NAUSEA AND VOMITING THAT IS NOT CONTROLLED WITH YOUR NAUSEA MEDICATION  *UNUSUAL SHORTNESS OF BREATH  *UNUSUAL BRUISING OR BLEEDING  TENDERNESS IN MOUTH AND THROAT WITH OR WITHOUT PRESENCE OF ULCERS  *URINARY PROBLEMS  *BOWEL PROBLEMS  UNUSUAL RASH Items with * indicate a potential emergency and should be followed up as soon as possible.  Feel free to call the clinic you have any questions or concerns. The clinic phone number is [phone].

## 2018-08-15 ENCOUNTER — Encounter (HOSPITAL_COMMUNITY): Payer: Self-pay | Admitting: Cardiovascular Disease

## 2018-08-21 ENCOUNTER — Encounter

## 2018-08-28 ENCOUNTER — Telehealth: Payer: Self-pay

## 2018-08-28 NOTE — Telephone Encounter (Signed)
Routed to [place] as [employer]

## 2018-08-28 NOTE — Telephone Encounter (Signed)
New message    Just an FYI. We have made several attempts to contact this patient including sending a letter to schedule or reschedule their echocardiogram. We will be removing the patient from the echo WQ.   Thank you 

## 2018-08-29 ENCOUNTER — Inpatient Hospital Stay: Payer: Medicare HMO

## 2018-08-29 ENCOUNTER — Ambulatory Visit (HOSPITAL_COMMUNITY)
Admission: RE | Admit: 2018-08-29 | Discharge: 2018-08-29 | Disposition: A | Discharge status: Home / Self Care | Payer: Medicare HMO | Source: Ambulatory Visit | Attending: Internal Medicine | Admitting: Internal Medicine

## 2018-08-29 DIAGNOSIS — R590 Localized enlarged lymph nodes: Secondary | ICD-10-CM | POA: Diagnosis not present

## 2018-08-29 DIAGNOSIS — I714 Abdominal aortic aneurysm, without rupture: Secondary | ICD-10-CM | POA: Insufficient documentation

## 2018-08-29 DIAGNOSIS — C3491 Malignant neoplasm of unspecified part of right bronchus or lung: Secondary | ICD-10-CM | POA: Diagnosis not present

## 2018-08-29 DIAGNOSIS — C3490 Malignant neoplasm of unspecified part of unspecified bronchus or lung: Secondary | ICD-10-CM | POA: Insufficient documentation

## 2018-08-29 DIAGNOSIS — J439 Emphysema, unspecified: Secondary | ICD-10-CM | POA: Insufficient documentation

## 2018-08-29 DIAGNOSIS — I700 Atherosclerosis of aorta: Secondary | ICD-10-CM | POA: Insufficient documentation

## 2018-09-01 ENCOUNTER — Inpatient Hospital Stay: Payer: Medicare HMO | Admitting: Nutrition

## 2018-09-01 ENCOUNTER — Inpatient Hospital Stay: Payer: Medicare HMO

## 2018-09-01 ENCOUNTER — Telehealth: Payer: Self-pay

## 2018-09-01 ENCOUNTER — Encounter: Payer: Self-pay | Admitting: Internal Medicine

## 2018-09-01 ENCOUNTER — Other Ambulatory Visit: Payer: Self-pay | Admitting: *Deleted

## 2018-09-01 ENCOUNTER — Inpatient Hospital Stay: Payer: Medicare HMO | Attending: Internal Medicine | Admitting: Internal Medicine

## 2018-09-01 VITALS — BP 121/70 | HR 84 | Temp 97.5°F | Resp 18 | Ht 70.0 in | Wt 157.1 lb

## 2018-09-01 DIAGNOSIS — C3411 Malignant neoplasm of upper lobe, right bronchus or lung: Secondary | ICD-10-CM

## 2018-09-01 DIAGNOSIS — Z87891 Personal history of nicotine dependence: Secondary | ICD-10-CM | POA: Diagnosis not present

## 2018-09-01 DIAGNOSIS — C3490 Malignant neoplasm of unspecified part of unspecified bronchus or lung: Secondary | ICD-10-CM

## 2018-09-01 DIAGNOSIS — N189 Chronic kidney disease, unspecified: Secondary | ICD-10-CM

## 2018-09-01 DIAGNOSIS — Z720 Tobacco use: Secondary | ICD-10-CM

## 2018-09-01 DIAGNOSIS — I10 Essential (primary) hypertension: Secondary | ICD-10-CM

## 2018-09-01 DIAGNOSIS — C342 Malignant neoplasm of middle lobe, bronchus or lung: Secondary | ICD-10-CM | POA: Insufficient documentation

## 2018-09-01 DIAGNOSIS — Z5112 Encounter for antineoplastic immunotherapy: Secondary | ICD-10-CM | POA: Insufficient documentation

## 2018-09-01 DIAGNOSIS — I129 Hypertensive chronic kidney disease with stage 1 through stage 4 chronic kidney disease, or unspecified chronic kidney disease: Secondary | ICD-10-CM | POA: Diagnosis not present

## 2018-09-01 DIAGNOSIS — Z79899 Other long term (current) drug therapy: Secondary | ICD-10-CM | POA: Diagnosis not present

## 2018-09-01 DIAGNOSIS — R5382 Chronic fatigue, unspecified: Secondary | ICD-10-CM

## 2018-09-01 LAB — CBC WITH DIFFERENTIAL (CANCER CENTER ONLY)
ABS IMMATURE GRANULOCYTES: 0.01 10*3/uL (ref 0.00–0.07)
BASOS PCT: 1 %
Basophils Absolute: 0.1 10*3/uL (ref 0.0–0.1)
Eosinophils Absolute: 0.3 10*3/uL (ref 0.0–0.5)
Eosinophils Relative: 5 %
HCT: 36.7 % — ABNORMAL LOW (ref 39.0–52.0)
HEMOGLOBIN: 11.4 g/dL — AB (ref 13.0–17.0)
IMMATURE GRANULOCYTES: 0 %
LYMPHS PCT: 12 %
Lymphs Abs: 0.7 10*3/uL (ref 0.7–4.0)
MCH: 30.8 pg (ref 26.0–34.0)
MCHC: 31.1 g/dL (ref 30.0–36.0)
MCV: 99.2 fL (ref 80.0–100.0)
MONO ABS: 0.4 10*3/uL (ref 0.1–1.0)
MONOS PCT: 6 %
NEUTROS ABS: 4.8 10*3/uL (ref 1.7–7.7)
NEUTROS PCT: 76 %
PLATELETS: 182 10*3/uL (ref 150–400)
RBC: 3.7 MIL/uL — ABNORMAL LOW (ref 4.22–5.81)
RDW: 15.5 % (ref 11.5–15.5)
WBC Count: 6.3 10*3/uL (ref 4.0–10.5)
nRBC: 0 % (ref 0.0–0.2)

## 2018-09-01 LAB — CMP (CANCER CENTER ONLY)
ALK PHOS: 94 U/L (ref 38–126)
ALT: 10 U/L (ref 0–44)
AST: 13 U/L — AB (ref 15–41)
Albumin: 2.9 g/dL — ABNORMAL LOW (ref 3.5–5.0)
Anion gap: 9 (ref 5–15)
BUN: 23 mg/dL (ref 8–23)
CALCIUM: 9.5 mg/dL (ref 8.9–10.3)
CO2: 24 mmol/L (ref 22–32)
CREATININE: 1.94 mg/dL — AB (ref 0.61–1.24)
Chloride: 110 mmol/L (ref 98–111)
GFR, EST AFRICAN AMERICAN: 37 mL/min — AB (ref >60)
GFR, Estimated: 32 mL/min — ABNORMAL LOW (ref >60)
Glucose, Bld: 101 mg/dL — ABNORMAL HIGH (ref 70–99)
Potassium: 4.5 mmol/L (ref 3.5–5.1)
Sodium: 143 mmol/L (ref 135–145)
Total Bilirubin: 0.5 mg/dL (ref 0.3–1.2)
Total Protein: 6.8 g/dL (ref 6.5–8.1)

## 2018-09-01 LAB — TSH: TSH: 0.822 u[IU]/mL (ref 0.320–4.118)

## 2018-09-01 MED ORDER — SODIUM CHLORIDE 0.9 % IV SOLN
Freq: Once | INTRAVENOUS | Status: AC
  Administered 2018-09-01: 13:00:00 via INTRAVENOUS
  Filled 2018-09-01: qty 250

## 2018-09-01 MED ORDER — SODIUM CHLORIDE 0.9 % IV SOLN
1200.0000 mg | Freq: Once | INTRAVENOUS | Status: AC
  Administered 2018-09-01: 1200 mg via INTRAVENOUS
  Filled 2018-09-01: qty 20

## 2018-09-01 NOTE — Patient Instructions (Signed)
[place] Discharge Instructions for Patients Receiving Chemotherapy  Today you received the following chemotherapy agents tecentriq  To help prevent nausea and vomiting after your treatment, we encourage you to take your nausea medication as directed  If you develop nausea and vomiting that is not controlled by your nausea medication, call the clinic.   BELOW ARE SYMPTOMS THAT SHOULD BE REPORTED IMMEDIATELY:  *FEVER GREATER THAN 100.5 F  *CHILLS WITH OR WITHOUT FEVER  NAUSEA AND VOMITING THAT IS NOT CONTROLLED WITH YOUR NAUSEA MEDICATION  *UNUSUAL SHORTNESS OF BREATH  *UNUSUAL BRUISING OR BLEEDING  TENDERNESS IN MOUTH AND THROAT WITH OR WITHOUT PRESENCE OF ULCERS  *URINARY PROBLEMS  *BOWEL PROBLEMS  UNUSUAL RASH Items with * indicate a potential emergency and should be followed up as soon as possible.  Feel free to call the clinic you have any questions or concerns. The clinic phone number is [phone].

## 2018-09-01 NOTE — Progress Notes (Signed)
[place] [phone]   [phone]  OFFICE PROGRESS NOTE  Patient, No Pcp Per No address on file  DIAGNOSIS: Extensive stage (T4, N2, M1a) small cell lung cancer presented with large right perihilar mass with mediastinal invasion and loculated malignant right pleural effusion diagnosed in June 2019.  PRIOR THERAPY:None.  CURRENT THERAPY: Systemic chemotherapy with carboplatin for AUC of 5 on day 1, etoposide 100 mg/M2 on days 1, 2 and 3 in addition to Tecentriq 1200 mg IV every 3 weeks with Neulasta support.  Status post 6 cycles.  Starting from cycle #6 the patient will be treated with maintenance single agent Tecentriq every 3 weeks  INTERVAL HISTORY: Christopher Wilson 77 y.o. male returns to the clinic today for follow-up visit accompanied by his daughter.  The patient is feeling fine today with no concerning complaints.  He tolerated the last cycle of his treatment with single agent Tecentriq much better.  He denied having any chest pain, shortness of breath, cough or hemoptysis.  He denied having any fever or chills.  He has no nausea, vomiting, diarrhea or constipation.  He has no significant weight loss or night sweats.  The patient had repeat CT scan of the chest, abdomen and pelvis performed recently and he is here for evaluation and discussion of his discuss results.  MEDICAL HISTORY: Past Medical History:  Diagnosis Date  . ACS (acute coronary syndrome), with ST elevation but stable coronary arteries on cath 06/27/12 06/27/2012  . CAD (coronary artery disease), with CABG 1989 and re-do Cabg 2002 X 3 vesssels.  progressive disease with Stent to Lt. Main, and 1st diag 2006, stent to VG to LCX 2007, with known occlusion history o  06/27/2012  . CKD (chronic kidney disease) stage 3, GFR 30-59 ml/min (HCC) 06/27/2012  . 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 Coronary artery disease   . GERD (gastroesophageal reflux disease)   . Hyperlipidemia LDL  goal < 70 06/27/2012  . Hypertension   . Myocardial infarction ([place])   . Peptic ulcer   . STEMI (ST elevation myocardial infarction) ([place]) 06/27/2012  . Tobacco abuse 06/27/2012    ALLERGIES:  has No Known Allergies.  MEDICATIONS:  Current Outpatient Medications  Medication Sig Dispense Refill  . acetaminophen (TYLENOL) 325 MG tablet Take 650 mg by mouth every 6 (six) hours as needed for moderate pain.    Marland Kitchen albuterol (PROVENTIL HFA;VENTOLIN HFA) 108 (90 Base) MCG/ACT inhaler Inhale 2 puffs into the lungs every 6 (six) hours as needed for wheezing or shortness of breath.    Marland Kitchen atorvastatin (LIPITOR) 80 MG tablet TAKE ONE TABLET BY MOUTH DAILY AT 6 P.M. NEEDS APPOINTMENT FOR FUTURE REFILLS. (Patient taking differently: Take 80 mg by mouth daily at 6 PM. ) 90 tablet 3  . isosorbide mononitrate (IMDUR) 30 MG 24 hr tablet Take 1 tablet (30 mg total) by mouth daily. 90 tablet 3  . metoprolol tartrate (LOPRESSOR) 25 MG tablet Take 1 tablet (25 mg total) by mouth 2 (two) times daily. 60 tablet 3  . nitroGLYCERIN (NITROSTAT) 0.4 MG SL tablet Place 1 tablet (0.4 mg total) under the tongue every 5 (five) minutes as needed for chest pain. 25 tablet 3  . prochlorperazine (COMPAZINE) 10 MG tablet Take 1 tablet (10 mg total) by mouth every 6 (six) hours as needed for nausea or vomiting. 30 tablet 0  . rivaroxaban (XARELTO) 20 MG TABS tablet Take 1 tablet (20 mg total) by mouth daily  with supper. 30 tablet 1   No current facility-administered medications for this visit.     SURGICAL HISTORY:  Past Surgical History:  Procedure Laterality Date  . coratid arterty surgery    . CORONARY ANGIOPLASTY WITH STENT PLACEMENT    . CORONARY ARTERY BYPASS GRAFT    . IR GUIDED DRAIN W CATHETER PLACEMENT  04/08/2018  . IR REMOVAL OF PLURAL CATH W/CUFF  06/19/2018  . LCEA    . LEFT HEART CATH N/A 06/27/2012   Procedure: LEFT HEART CATH;  Surgeon: Troy Sine, MD;  Location[facility] CATH LAB;  Service: Cardiovascular;   Laterality: N/A;  . triple bipass     Hx of CABG and RE do  . VASCULAR SURGERY    . VIDEO BRONCHOSCOPY Bilateral 04/07/2018   Procedure: VIDEO BRONCHOSCOPY WITHOUT FLUORO;  Surgeon: Rush Farmer, MD;  Location: [facility] ENDOSCOPY;  Service: Cardiopulmonary;  Laterality: Bilateral;    REVIEW OF SYSTEMS:  Constitutional: positive for fatigue Eyes: negative Ears, nose, mouth, throat, and face: negative Respiratory: negative Cardiovascular: negative Gastrointestinal: negative Genitourinary:negative Integument/breast: negative Hematologic/lymphatic: negative Musculoskeletal:negative Neurological: negative Behavioral/Psych: negative Endocrine: negative Allergic/Immunologic: negative   PHYSICAL EXAMINATION: General appearance: alert, cooperative, fatigued and no distress Head: Normocephalic, without obvious abnormality, atraumatic Neck: no adenopathy, no JVD, supple, symmetrical, trachea midline and thyroid not enlarged, symmetric, no tenderness/mass/nodules Lymph nodes: Cervical, supraclavicular, and axillary nodes normal. Resp: clear to auscultation bilaterally Back: symmetric, no curvature. ROM normal. No CVA tenderness. Cardio: regular rate and rhythm, S1, S2 normal, no murmur, click, rub or gallop GI: soft, non-tender; bowel sounds normal; no masses,  no organomegaly Extremities: extremities normal, atraumatic, no cyanosis or edema Neurologic: Alert and oriented X 3, normal strength and tone. Normal symmetric reflexes. Normal coordination and gait  ECOG PERFORMANCE STATUS: 1 - Symptomatic but completely ambulatory  Blood pressure 121/70, pulse 84, temperature (!) 97.5 F (36.4 C), temperature source Oral, resp. rate 18, height 5\' 10"  (1.778 m), weight 157 lb 1.6 oz (71.3 kg), SpO2 95 %.  LABORATORY DATA: Lab Results  Component Value Date   WBC 6.3 09/01/2018   HGB 11.4 (L) 09/01/2018   HCT 36.7 (L) 09/01/2018   MCV 99.2 09/01/2018   PLT 182 09/01/2018      Chemistry        Component Value Date/Time   NA 143 09/01/2018 1046   NA 140 03/21/2018 1048   K 4.5 09/01/2018 1046   CL 110 09/01/2018 1046   CO2 24 09/01/2018 1046   BUN 23 09/01/2018 1046   BUN 45 (H) 03/21/2018 1048   CREATININE 1.94 (H) 09/01/2018 1046   CREATININE 1.80 (H) 09/06/2014 1033      Component Value Date/Time   CALCIUM 9.5 09/01/2018 1046   ALKPHOS 94 09/01/2018 1046   AST 13 (L) 09/01/2018 1046   ALT 10 09/01/2018 1046   BILITOT 0.5 09/01/2018 1046       RADIOGRAPHIC STUDIES: Ct Abdomen Pelvis Wo Contrast  Result Date: 08/29/2018 CLINICAL DATA:  Extensive stage small cell right lung cancer diagnosed June 2019 status post chemotherapy with ongoing immunotherapy. Restaging. EXAM: CT CHEST, ABDOMEN AND PELVIS WITHOUT CONTRAST TECHNIQUE: Multidetector CT imaging of the chest, abdomen and pelvis was performed following the standard protocol without IV contrast. COMPARISON:  06/13/2018 CT chest, abdomen and pelvis. 04/21/2018 PET-CT. FINDINGS: CT CHEST FINDINGS Cardiovascular: Top-normal heart size. Stable subendocardial fat along the free wall of the left ventricle compatible with prior myocardial infarction. Three-vessel coronary atherosclerosis status post CABG. Atherosclerotic nonaneurysmal  thoracic aorta. Normal caliber pulmonary arteries. Mediastinum/Nodes: No discrete thyroid nodules. Unremarkable esophagus. No axillary adenopathy. No residual discrete pathologically enlarged mediastinal nodes on this noncontrast scan. Mildly enlarged 1.1 cm right hilar node (series 2/image 30), decreased from 1.5 cm using similar measurement technique. No discrete left hilar adenopathy. Lungs/Pleura: No pneumothorax. Interval removal of right chest tube. No pleural effusions. Mild right lateral pleural thickening (series 2/image 29) is decreased. Right middle lobe 3.8 x 2.9 cm lung mass extending into the basilar right upper lobe (series 2/image 31), decreased from 5.0 x 3.7 cm. Moderate centrilobular  and paraseptal emphysema with diffuse bronchial wall thickening. Irregular apical right upper lobe 1.5 cm solid pulmonary nodule (series 4/image 26), stable. Posterior right upper lobe 0.5 cm pulmonary nodule (series 4/image 66), stable. Curvilinear parenchymal band in dependent basilar right lower lobe is decreased in thickness, compatible with evolving scarring. No acute consolidative airspace disease. No new significant pulmonary nodules. Musculoskeletal: No aggressive appearing focal osseous lesions. Intact sternotomy wires. Moderate thoracic spondylosis. CT ABDOMEN PELVIS FINDINGS Hepatobiliary: Normal size liver. Simple 0.9 cm lateral segment left liver lobe cyst is stable. No new liver lesions. Normal gallbladder with no radiopaque cholelithiasis. No biliary ductal dilatation. Pancreas: Normal, with no mass or duct dilation. Spleen: Normal size. No mass. Adrenals/Urinary Tract: Normal adrenals. Stable severe atrophy of the right kidney with stable calcified 1.7 cm exophytic lateral lower right renal cortical lesion. Multiple nonobstructing stones throughout the left kidney, largest 7 mm in the upper left kidney. No hydronephrosis. Stable septated 2.5 cm lateral lower left renal cyst. Stable subcentimeter hyperdense renal cortical lesions in the upper and lower left kidney. Normal bladder. Stomach/Bowel: Normal non-distended stomach. Normal caliber small bowel with no small bowel wall thickening. Normal appendix. Marked colonic diverticulosis, most prominent in the sigmoid colon, with no large bowel wall thickening or acute pericolonic fat stranding. Vascular/Lymphatic: Atherosclerotic abdominal aorta with stable 4.1 cm infrarenal abdominal aortic aneurysm. No pathologically enlarged lymph nodes in the abdomen or pelvis. Reproductive: Normal size prostate with coarse internal prostatic calcifications. Other: No pneumoperitoneum, ascites or focal fluid collection. Musculoskeletal: No aggressive appearing focal  osseous lesions. Moderate lumbar spondylosis. IMPRESSION: 1. Continued positive partial treatment response. Right middle/upper lobe lung mass is decreased. Additional right upper lobe pulmonary nodules are stable. Mild lateral right pleural thickening is decreased, with no recurrent right pleural effusion. Right hilar and mediastinal adenopathy is decreased. No new or progressive metastatic disease. 2. Aortic Atherosclerosis (ICD10-I70.0) and Emphysema (ICD10-J43.9). 3. Stable 4.1 cm infrarenal Abdominal Aortic Aneurysm (ICD10-I71.9). Recommend follow-up aortic ultrasound in 1 year. This recommendation follows ACR consensus guidelines: White Paper of the ACR Incidental Findings Committee II on Vascular Findings. J Am Coll Radiol 2013; 10:789-794. Electronically Signed   By: Ilona Sorrel M.D.   On: 08/29/2018 16:46   Ct Chest Wo Contrast  Result Date: 08/29/2018 CLINICAL DATA:  Extensive stage small cell right lung cancer diagnosed June 2019 status post chemotherapy with ongoing immunotherapy. Restaging. EXAM: CT CHEST, ABDOMEN AND PELVIS WITHOUT CONTRAST TECHNIQUE: Multidetector CT imaging of the chest, abdomen and pelvis was performed following the standard protocol without IV contrast. COMPARISON:  06/13/2018 CT chest, abdomen and pelvis. 04/21/2018 PET-CT. FINDINGS: CT CHEST FINDINGS Cardiovascular: Top-normal heart size. Stable subendocardial fat along the free wall of the left ventricle compatible with prior myocardial infarction. Three-vessel coronary atherosclerosis status post CABG. Atherosclerotic nonaneurysmal thoracic aorta. Normal caliber pulmonary arteries. Mediastinum/Nodes: No discrete thyroid nodules. Unremarkable esophagus. No axillary adenopathy. No residual discrete pathologically enlarged mediastinal  nodes on this noncontrast scan. Mildly enlarged 1.1 cm right hilar node (series 2/image 30), decreased from 1.5 cm using similar measurement technique. No discrete left hilar adenopathy.  Lungs/Pleura: No pneumothorax. Interval removal of right chest tube. No pleural effusions. Mild right lateral pleural thickening (series 2/image 29) is decreased. Right middle lobe 3.8 x 2.9 cm lung mass extending into the basilar right upper lobe (series 2/image 31), decreased from 5.0 x 3.7 cm. Moderate centrilobular and paraseptal emphysema with diffuse bronchial wall thickening. Irregular apical right upper lobe 1.5 cm solid pulmonary nodule (series 4/image 26), stable. Posterior right upper lobe 0.5 cm pulmonary nodule (series 4/image 66), stable. Curvilinear parenchymal band in dependent basilar right lower lobe is decreased in thickness, compatible with evolving scarring. No acute consolidative airspace disease. No new significant pulmonary nodules. Musculoskeletal: No aggressive appearing focal osseous lesions. Intact sternotomy wires. Moderate thoracic spondylosis. CT ABDOMEN PELVIS FINDINGS Hepatobiliary: Normal size liver. Simple 0.9 cm lateral segment left liver lobe cyst is stable. No new liver lesions. Normal gallbladder with no radiopaque cholelithiasis. No biliary ductal dilatation. Pancreas: Normal, with no mass or duct dilation. Spleen: Normal size. No mass. Adrenals/Urinary Tract: Normal adrenals. Stable severe atrophy of the right kidney with stable calcified 1.7 cm exophytic lateral lower right renal cortical lesion. Multiple nonobstructing stones throughout the left kidney, largest 7 mm in the upper left kidney. No hydronephrosis. Stable septated 2.5 cm lateral lower left renal cyst. Stable subcentimeter hyperdense renal cortical lesions in the upper and lower left kidney. Normal bladder. Stomach/Bowel: Normal non-distended stomach. Normal caliber small bowel with no small bowel wall thickening. Normal appendix. Marked colonic diverticulosis, most prominent in the sigmoid colon, with no large bowel wall thickening or acute pericolonic fat stranding. Vascular/Lymphatic: Atherosclerotic abdominal  aorta with stable 4.1 cm infrarenal abdominal aortic aneurysm. No pathologically enlarged lymph nodes in the abdomen or pelvis. Reproductive: Normal size prostate with coarse internal prostatic calcifications. Other: No pneumoperitoneum, ascites or focal fluid collection. Musculoskeletal: No aggressive appearing focal osseous lesions. Moderate lumbar spondylosis. IMPRESSION: 1. Continued positive partial treatment response. Right middle/upper lobe lung mass is decreased. Additional right upper lobe pulmonary nodules are stable. Mild lateral right pleural thickening is decreased, with no recurrent right pleural effusion. Right hilar and mediastinal adenopathy is decreased. No new or progressive metastatic disease. 2. Aortic Atherosclerosis (ICD10-I70.0) and Emphysema (ICD10-J43.9). 3. Stable 4.1 cm infrarenal Abdominal Aortic Aneurysm (ICD10-I71.9). Recommend follow-up aortic ultrasound in 1 year. This recommendation follows ACR consensus guidelines: White Paper of the ACR Incidental Findings Committee II on Vascular Findings. J Am Coll Radiol 2013; 10:789-794. Electronically Signed   By: Ilona Sorrel M.D.   On: 08/29/2018 16:46    ASSESSMENT AND PLAN: This is a very pleasant 77 years old white male recently diagnosed with extensive stage small cell lung cancer and currently undergoing systemic chemotherapy with carboplatin, etoposide and Tecentriq status post 5 cycles. He is also status post 1 cycle of maintenance treatment with single agent Tecentriq.  The patient had repeat CT scan of the chest, abdomen and pelvis performed recently.  I personally and independently reviewed the scans and discussed the results with the patient his daughter today. His a scan showed continuous improvement of his disease with decrease in the size of the right middle/upper lobe lung mass. I recommended for the patient to continue his current treatment with maintenance Tecentriq and he will proceed with cycle #2 today. I will see  the patient back for follow-up visit in 3 weeks for evaluation  before the next cycle of his treatment. For hypertension he was advised to take his blood pressure medication as prescribed and to monitor it closely at home. For the renal insufficiency, I strongly encouraged the patient to increase his oral hydration. The patient was advised to call immediately if he has any concerning symptoms in the interval. The patient voices understanding of current disease status and treatment options and is in agreement with the current care plan.  All questions were answered. The patient knows to call the clinic with any problems, questions or concerns. We can certainly see the patient much sooner if necessary.  Disclaimer: This note was dictated with voice recognition software. Similar sounding words can inadvertently be transcribed and may not be corrected upon review.

## 2018-09-01 NOTE — Progress Notes (Signed)
Brief nutrition follow-up completed with patient during infusion for lung cancer. Weight has improved and was documented as 157.1 pounds up from 155.2 pounds October 11. Patient reports he feels he is eating well. He denies nutrition impact symptoms. He is no longer drinking oral nutrition supplements.  He is tired of them. He has no nutrition concerns or questions.  Nutrition diagnosis: Inadequate oral intake has improved.  Intervention:  Educated patient to continue strategies for increased calorie and protein intake.  Provided suggestions for using oral nutrition supplements to provide increased variety. Provided patient with coupons in case he wants to purchase oral nutrition supplements in the future. Recommended patient contact me with questions or concerns.  Monitoring, evaluation, goals: Patient will tolerate increased calories and protein to minimize weight loss.  No follow-up scheduled patient has contact information in case he develops questions or concerns.  **Disclaimer: This note was dictated with voice recognition software. Similar sounding words can inadvertently be transcribed and this note may contain transcription errors which may not have been corrected upon publication of note.**

## 2018-09-01 NOTE — Progress Notes (Signed)
Labs reviewed by MD," ok to treat" despite labs.Per MD.

## 2018-09-01 NOTE — Telephone Encounter (Signed)
Printed avs and calender of upcoming appointment. Per 11/4 los

## 2018-09-22 ENCOUNTER — Inpatient Hospital Stay: Payer: Medicare HMO

## 2018-09-22 ENCOUNTER — Encounter: Payer: Self-pay | Admitting: Internal Medicine

## 2018-09-22 ENCOUNTER — Inpatient Hospital Stay (HOSPITAL_BASED_OUTPATIENT_CLINIC_OR_DEPARTMENT_OTHER): Payer: Medicare HMO | Admitting: Internal Medicine

## 2018-09-22 ENCOUNTER — Other Ambulatory Visit: Payer: Self-pay | Admitting: Medical Oncology

## 2018-09-22 VITALS — BP 99/62 | HR 88 | Temp 97.7°F | Resp 18 | Ht 70.0 in | Wt 157.6 lb

## 2018-09-22 DIAGNOSIS — C3411 Malignant neoplasm of upper lobe, right bronchus or lung: Secondary | ICD-10-CM

## 2018-09-22 DIAGNOSIS — C3490 Malignant neoplasm of unspecified part of unspecified bronchus or lung: Secondary | ICD-10-CM

## 2018-09-22 DIAGNOSIS — Z5112 Encounter for antineoplastic immunotherapy: Secondary | ICD-10-CM | POA: Diagnosis not present

## 2018-09-22 DIAGNOSIS — I10 Essential (primary) hypertension: Secondary | ICD-10-CM

## 2018-09-22 DIAGNOSIS — C342 Malignant neoplasm of middle lobe, bronchus or lung: Secondary | ICD-10-CM | POA: Diagnosis not present

## 2018-09-22 DIAGNOSIS — R5382 Chronic fatigue, unspecified: Secondary | ICD-10-CM

## 2018-09-22 DIAGNOSIS — Z79899 Other long term (current) drug therapy: Secondary | ICD-10-CM | POA: Diagnosis not present

## 2018-09-22 LAB — CMP (CANCER CENTER ONLY)
ALK PHOS: 86 U/L (ref 38–126)
ALT: 8 U/L (ref 0–44)
ANION GAP: 10 (ref 5–15)
AST: 11 U/L — ABNORMAL LOW (ref 15–41)
Albumin: 3.2 g/dL — ABNORMAL LOW (ref 3.5–5.0)
BUN: 31 mg/dL — ABNORMAL HIGH (ref 8–23)
CALCIUM: 9.9 mg/dL (ref 8.9–10.3)
CHLORIDE: 109 mmol/L (ref 98–111)
CO2: 23 mmol/L (ref 22–32)
Creatinine: 1.91 mg/dL — ABNORMAL HIGH (ref 0.61–1.24)
GFR, EST AFRICAN AMERICAN: 38 mL/min — AB (ref >60)
GFR, Estimated: 32 mL/min — ABNORMAL LOW (ref >60)
Glucose, Bld: 110 mg/dL — ABNORMAL HIGH (ref 70–99)
POTASSIUM: 4.3 mmol/L (ref 3.5–5.1)
SODIUM: 142 mmol/L (ref 135–145)
Total Bilirubin: 1 mg/dL (ref 0.3–1.2)
Total Protein: 7.1 g/dL (ref 6.5–8.1)

## 2018-09-22 LAB — CBC WITH DIFFERENTIAL (CANCER CENTER ONLY)
ABS IMMATURE GRANULOCYTES: 0.03 10*3/uL (ref 0.00–0.07)
Basophils Absolute: 0 10*3/uL (ref 0.0–0.1)
Basophils Relative: 1 %
EOS ABS: 0.1 10*3/uL (ref 0.0–0.5)
Eosinophils Relative: 1 %
HEMATOCRIT: 38.9 % — AB (ref 39.0–52.0)
HEMOGLOBIN: 12.3 g/dL — AB (ref 13.0–17.0)
Immature Granulocytes: 0 %
LYMPHS ABS: 0.7 10*3/uL (ref 0.7–4.0)
Lymphocytes Relative: 9 %
MCH: 31.4 pg (ref 26.0–34.0)
MCHC: 31.6 g/dL (ref 30.0–36.0)
MCV: 99.2 fL (ref 80.0–100.0)
Monocytes Absolute: 0.8 10*3/uL (ref 0.1–1.0)
Monocytes Relative: 11 %
NEUTROS ABS: 5.6 10*3/uL (ref 1.7–7.7)
NEUTROS PCT: 78 %
NRBC: 0 % (ref 0.0–0.2)
Platelet Count: 174 10*3/uL (ref 150–400)
RBC: 3.92 MIL/uL — ABNORMAL LOW (ref 4.22–5.81)
RDW: 15.1 % (ref 11.5–15.5)
WBC Count: 7.2 10*3/uL (ref 4.0–10.5)

## 2018-09-22 LAB — TSH

## 2018-09-22 MED ORDER — SODIUM CHLORIDE 0.9 % IV SOLN
Freq: Once | INTRAVENOUS | Status: AC
  Administered 2018-09-22: 13:00:00 via INTRAVENOUS
  Filled 2018-09-22: qty 250

## 2018-09-22 MED ORDER — SODIUM CHLORIDE 0.9 % IV SOLN
1200.0000 mg | Freq: Once | INTRAVENOUS | Status: AC
  Administered 2018-09-22: 1200 mg via INTRAVENOUS
  Filled 2018-09-22: qty 20

## 2018-09-22 NOTE — Progress Notes (Signed)
[name] [phone]   [phone]  OFFICE PROGRESS NOTE  Patient, No Pcp Per No address on file  DIAGNOSIS: Extensive stage (T4, N2, M1a) small cell lung cancer presented with large right perihilar mass with mediastinal invasion and loculated malignant right pleural effusion diagnosed in June 2019.  PRIOR THERAPY:None.  CURRENT THERAPY: Systemic chemotherapy with carboplatin for AUC of 5 on day 1, etoposide 100 mg/M2 on days 1, 2 and 3 in addition to Tecentriq 1200 mg IV every 3 weeks with Neulasta support.  Status post 7 cycles.  Starting from cycle #6 the patient will be treated with maintenance single agent Tecentriq every 3 weeks  INTERVAL HISTORY: Christopher Wilson 77 y.o. male returns to the clinic today for follow-up visit.  The patient is feeling fine today with no concerning complaints except for fatigue and shortness of breath with exertion.  He is tolerating his current treatment fairly well with no concerning adverse effects.  He denied having any skin rash or diarrhea.  He has no nausea, vomiting or constipation.  He denied having any chest pain, cough or hemoptysis.  He has no significant weight loss.  He is here today for evaluation before starting cycle #8 of his treatment.  MEDICAL HISTORY: Past Medical History:  Diagnosis Date  . ACS (acute coronary syndrome), with ST elevation but stable coronary arteries on cath 06/27/12 06/27/2012  . CAD (coronary artery disease), with CABG 1989 and re-do Cabg 2002 X 3 vesssels.  progressive disease with Stent to Lt. Main, and 1st diag 2006, stent to VG to LCX 2007, with known occlusion history o  06/27/2012  . CKD (chronic kidney disease) stage 3, GFR 30-59 ml/min (HCC) 06/27/2012  . 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 Coronary artery disease   . GERD (gastroesophageal reflux disease)   . Hyperlipidemia LDL goal < 70 06/27/2012  . Hypertension   . Myocardial infarction ([place])   . Peptic ulcer     . STEMI (ST elevation myocardial infarction) ([place]) 06/27/2012  . Tobacco abuse 06/27/2012    ALLERGIES:  has No Known Allergies.  MEDICATIONS:  Current Outpatient Medications  Medication Sig Dispense Refill  . acetaminophen (TYLENOL) 325 MG tablet Take 650 mg by mouth every 6 (six) hours as needed for moderate pain.    Marland Kitchen albuterol (PROVENTIL HFA;VENTOLIN HFA) 108 (90 Base) MCG/ACT inhaler Inhale 2 puffs into the lungs every 6 (six) hours as needed for wheezing or shortness of breath.    Marland Kitchen atorvastatin (LIPITOR) 80 MG tablet TAKE ONE TABLET BY MOUTH DAILY AT 6 P.M. NEEDS APPOINTMENT FOR FUTURE REFILLS. (Patient taking differently: Take 80 mg by mouth daily at 6 PM. ) 90 tablet 3  . isosorbide mononitrate (IMDUR) 30 MG 24 hr tablet Take 1 tablet (30 mg total) by mouth daily. 90 tablet 3  . metoprolol tartrate (LOPRESSOR) 25 MG tablet Take 1 tablet (25 mg total) by mouth 2 (two) times daily. 60 tablet 3  . nitroGLYCERIN (NITROSTAT) 0.4 MG SL tablet Place 1 tablet (0.4 mg total) under the tongue every 5 (five) minutes as needed for chest pain. 25 tablet 3  . prochlorperazine (COMPAZINE) 10 MG tablet Take 1 tablet (10 mg total) by mouth every 6 (six) hours as needed for nausea or vomiting. 30 tablet 0  . rivaroxaban (XARELTO) 20 MG TABS tablet Take 1 tablet (20 mg total) by mouth daily with supper. 30 tablet 1   No current facility-administered medications for this visit.  SURGICAL HISTORY:  Past Surgical History:  Procedure Laterality Date  . coratid arterty surgery    . CORONARY ANGIOPLASTY WITH STENT PLACEMENT    . CORONARY ARTERY BYPASS GRAFT    . IR GUIDED DRAIN W CATHETER PLACEMENT  04/08/2018  . IR REMOVAL OF PLURAL CATH W/CUFF  06/19/2018  . LCEA    . LEFT HEART CATH N/A 06/27/2012   Procedure: LEFT HEART CATH;  Surgeon: Troy Sine, MD;  Location: [facility] CATH LAB;  Service: Cardiovascular;  Laterality: N/A;  . triple bipass     Hx of CABG and RE do  . VASCULAR SURGERY    . VIDEO  BRONCHOSCOPY Bilateral 04/07/2018   Procedure: VIDEO BRONCHOSCOPY WITHOUT FLUORO;  Surgeon: Rush Farmer, MD;  Location: [facility] ENDOSCOPY;  Service: Cardiopulmonary;  Laterality: Bilateral;    REVIEW OF SYSTEMS:  A comprehensive review of systems was negative except for: Constitutional: positive for fatigue Respiratory: positive for dyspnea on exertion Musculoskeletal: positive for arthralgias   PHYSICAL EXAMINATION: General appearance: alert, cooperative, fatigued and no distress Head: Normocephalic, without obvious abnormality, atraumatic Neck: no adenopathy, no JVD, supple, symmetrical, trachea midline and thyroid not enlarged, symmetric, no tenderness/mass/nodules Lymph nodes: Cervical, supraclavicular, and axillary nodes normal. Resp: clear to auscultation bilaterally Back: symmetric, no curvature. ROM normal. No CVA tenderness. Cardio: regular rate and rhythm, S1, S2 normal, no murmur, click, rub or gallop GI: soft, non-tender; bowel sounds normal; no masses,  no organomegaly Extremities: extremities normal, atraumatic, no cyanosis or edema  ECOG PERFORMANCE STATUS: 1 - Symptomatic but completely ambulatory  Blood pressure 99/62, pulse 88, temperature 97.7 F (36.5 C), temperature source Oral, resp. rate 18, height 5\' 10"  (1.778 m), weight 157 lb 9.6 oz (71.5 kg), SpO2 96 %.  LABORATORY DATA: Lab Results  Component Value Date   WBC 6.3 09/01/2018   HGB 11.4 (L) 09/01/2018   HCT 36.7 (L) 09/01/2018   MCV 99.2 09/01/2018   PLT 182 09/01/2018      Chemistry      Component Value Date/Time   NA 143 09/01/2018 1046   NA 140 03/21/2018 1048   K 4.5 09/01/2018 1046   CL 110 09/01/2018 1046   CO2 24 09/01/2018 1046   BUN 23 09/01/2018 1046   BUN 45 (H) 03/21/2018 1048   CREATININE 1.94 (H) 09/01/2018 1046   CREATININE 1.80 (H) 09/06/2014 1033      Component Value Date/Time   CALCIUM 9.5 09/01/2018 1046   ALKPHOS 94 09/01/2018 1046   AST 13 (L) 09/01/2018 1046   ALT 10  09/01/2018 1046   BILITOT 0.5 09/01/2018 1046       RADIOGRAPHIC STUDIES: Ct Abdomen Pelvis Wo Contrast  Result Date: 08/29/2018 CLINICAL DATA:  Extensive stage small cell right lung cancer diagnosed June 2019 status post chemotherapy with ongoing immunotherapy. Restaging. EXAM: CT CHEST, ABDOMEN AND PELVIS WITHOUT CONTRAST TECHNIQUE: Multidetector CT imaging of the chest, abdomen and pelvis was performed following the standard protocol without IV contrast. COMPARISON:  06/13/2018 CT chest, abdomen and pelvis. 04/21/2018 PET-CT. FINDINGS: CT CHEST FINDINGS Cardiovascular: Top-normal heart size. Stable subendocardial fat along the free wall of the left ventricle compatible with prior myocardial infarction. Three-vessel coronary atherosclerosis status post CABG. Atherosclerotic nonaneurysmal thoracic aorta. Normal caliber pulmonary arteries. Mediastinum/Nodes: No discrete thyroid nodules. Unremarkable esophagus. No axillary adenopathy. No residual discrete pathologically enlarged mediastinal nodes on this noncontrast scan. Mildly enlarged 1.1 cm right hilar node (series 2/image 30), decreased from 1.5 cm using similar measurement technique. No discrete  left hilar adenopathy. Lungs/Pleura: No pneumothorax. Interval removal of right chest tube. No pleural effusions. Mild right lateral pleural thickening (series 2/image 29) is decreased. Right middle lobe 3.8 x 2.9 cm lung mass extending into the basilar right upper lobe (series 2/image 31), decreased from 5.0 x 3.7 cm. Moderate centrilobular and paraseptal emphysema with diffuse bronchial wall thickening. Irregular apical right upper lobe 1.5 cm solid pulmonary nodule (series 4/image 26), stable. Posterior right upper lobe 0.5 cm pulmonary nodule (series 4/image 66), stable. Curvilinear parenchymal band in dependent basilar right lower lobe is decreased in thickness, compatible with evolving scarring. No acute consolidative airspace disease. No new significant  pulmonary nodules. Musculoskeletal: No aggressive appearing focal osseous lesions. Intact sternotomy wires. Moderate thoracic spondylosis. CT ABDOMEN PELVIS FINDINGS Hepatobiliary: Normal size liver. Simple 0.9 cm lateral segment left liver lobe cyst is stable. No new liver lesions. Normal gallbladder with no radiopaque cholelithiasis. No biliary ductal dilatation. Pancreas: Normal, with no mass or duct dilation. Spleen: Normal size. No mass. Adrenals/Urinary Tract: Normal adrenals. Stable severe atrophy of the right kidney with stable calcified 1.7 cm exophytic lateral lower right renal cortical lesion. Multiple nonobstructing stones throughout the left kidney, largest 7 mm in the upper left kidney. No hydronephrosis. Stable septated 2.5 cm lateral lower left renal cyst. Stable subcentimeter hyperdense renal cortical lesions in the upper and lower left kidney. Normal bladder. Stomach/Bowel: Normal non-distended stomach. Normal caliber small bowel with no small bowel wall thickening. Normal appendix. Marked colonic diverticulosis, most prominent in the sigmoid colon, with no large bowel wall thickening or acute pericolonic fat stranding. Vascular/Lymphatic: Atherosclerotic abdominal aorta with stable 4.1 cm infrarenal abdominal aortic aneurysm. No pathologically enlarged lymph nodes in the abdomen or pelvis. Reproductive: Normal size prostate with coarse internal prostatic calcifications. Other: No pneumoperitoneum, ascites or focal fluid collection. Musculoskeletal: No aggressive appearing focal osseous lesions. Moderate lumbar spondylosis. IMPRESSION: 1. Continued positive partial treatment response. Right middle/upper lobe lung mass is decreased. Additional right upper lobe pulmonary nodules are stable. Mild lateral right pleural thickening is decreased, with no recurrent right pleural effusion. Right hilar and mediastinal adenopathy is decreased. No new or progressive metastatic disease. 2. Aortic  Atherosclerosis (ICD10-I70.0) and Emphysema (ICD10-J43.9). 3. Stable 4.1 cm infrarenal Abdominal Aortic Aneurysm (ICD10-I71.9). Recommend follow-up aortic ultrasound in 1 year. This recommendation follows ACR consensus guidelines: White Paper of the ACR Incidental Findings Committee II on Vascular Findings. J Am Coll Radiol 2013; 10:789-794. Electronically Signed   By: Ilona Sorrel M.D.   On: 08/29/2018 16:46   Ct Chest Wo Contrast  Result Date: 08/29/2018 CLINICAL DATA:  Extensive stage small cell right lung cancer diagnosed June 2019 status post chemotherapy with ongoing immunotherapy. Restaging. EXAM: CT CHEST, ABDOMEN AND PELVIS WITHOUT CONTRAST TECHNIQUE: Multidetector CT imaging of the chest, abdomen and pelvis was performed following the standard protocol without IV contrast. COMPARISON:  06/13/2018 CT chest, abdomen and pelvis. 04/21/2018 PET-CT. FINDINGS: CT CHEST FINDINGS Cardiovascular: Top-normal heart size. Stable subendocardial fat along the free wall of the left ventricle compatible with prior myocardial infarction. Three-vessel coronary atherosclerosis status post CABG. Atherosclerotic nonaneurysmal thoracic aorta. Normal caliber pulmonary arteries. Mediastinum/Nodes: No discrete thyroid nodules. Unremarkable esophagus. No axillary adenopathy. No residual discrete pathologically enlarged mediastinal nodes on this noncontrast scan. Mildly enlarged 1.1 cm right hilar node (series 2/image 30), decreased from 1.5 cm using similar measurement technique. No discrete left hilar adenopathy. Lungs/Pleura: No pneumothorax. Interval removal of right chest tube. No pleural effusions. Mild right lateral pleural thickening (series 2/image  29) is decreased. Right middle lobe 3.8 x 2.9 cm lung mass extending into the basilar right upper lobe (series 2/image 31), decreased from 5.0 x 3.7 cm. Moderate centrilobular and paraseptal emphysema with diffuse bronchial wall thickening. Irregular apical right upper lobe  1.5 cm solid pulmonary nodule (series 4/image 26), stable. Posterior right upper lobe 0.5 cm pulmonary nodule (series 4/image 66), stable. Curvilinear parenchymal band in dependent basilar right lower lobe is decreased in thickness, compatible with evolving scarring. No acute consolidative airspace disease. No new significant pulmonary nodules. Musculoskeletal: No aggressive appearing focal osseous lesions. Intact sternotomy wires. Moderate thoracic spondylosis. CT ABDOMEN PELVIS FINDINGS Hepatobiliary: Normal size liver. Simple 0.9 cm lateral segment left liver lobe cyst is stable. No new liver lesions. Normal gallbladder with no radiopaque cholelithiasis. No biliary ductal dilatation. Pancreas: Normal, with no mass or duct dilation. Spleen: Normal size. No mass. Adrenals/Urinary Tract: Normal adrenals. Stable severe atrophy of the right kidney with stable calcified 1.7 cm exophytic lateral lower right renal cortical lesion. Multiple nonobstructing stones throughout the left kidney, largest 7 mm in the upper left kidney. No hydronephrosis. Stable septated 2.5 cm lateral lower left renal cyst. Stable subcentimeter hyperdense renal cortical lesions in the upper and lower left kidney. Normal bladder. Stomach/Bowel: Normal non-distended stomach. Normal caliber small bowel with no small bowel wall thickening. Normal appendix. Marked colonic diverticulosis, most prominent in the sigmoid colon, with no large bowel wall thickening or acute pericolonic fat stranding. Vascular/Lymphatic: Atherosclerotic abdominal aorta with stable 4.1 cm infrarenal abdominal aortic aneurysm. No pathologically enlarged lymph nodes in the abdomen or pelvis. Reproductive: Normal size prostate with coarse internal prostatic calcifications. Other: No pneumoperitoneum, ascites or focal fluid collection. Musculoskeletal: No aggressive appearing focal osseous lesions. Moderate lumbar spondylosis. IMPRESSION: 1. Continued positive partial treatment  response. Right middle/upper lobe lung mass is decreased. Additional right upper lobe pulmonary nodules are stable. Mild lateral right pleural thickening is decreased, with no recurrent right pleural effusion. Right hilar and mediastinal adenopathy is decreased. No new or progressive metastatic disease. 2. Aortic Atherosclerosis (ICD10-I70.0) and Emphysema (ICD10-J43.9). 3. Stable 4.1 cm infrarenal Abdominal Aortic Aneurysm (ICD10-I71.9). Recommend follow-up aortic ultrasound in 1 year. This recommendation follows ACR consensus guidelines: White Paper of the ACR Incidental Findings Committee II on Vascular Findings. J Am Coll Radiol 2013; 10:789-794. Electronically Signed   By: Ilona Sorrel M.D.   On: 08/29/2018 16:46    ASSESSMENT AND PLAN: This is a very pleasant 77 years old white male recently diagnosed with extensive stage small cell lung cancer and currently undergoing systemic chemotherapy with carboplatin, etoposide and Tecentriq status post 5 cycles. He is also status post 2 cycle of maintenance treatment with single agent Tecentriq.   He continues to tolerate this treatment well with no concerning adverse effects. I recommended for the patient to proceed with cycle #3 of the maintenance Tecentriq today as scheduled. I will see him back for follow-up visit in 3 weeks for evaluation before starting cycle #4. For hypertension he was advised to take his blood pressure medication as prescribed and to monitor it closely at home. He was advised to call immediately if he has any concerning symptoms in the interval. The patient voices understanding of current disease status and treatment options and is in agreement with the current care plan.  All questions were answered. The patient knows to call the clinic with any problems, questions or concerns. We can certainly see the patient much sooner if necessary.  Disclaimer: This note was dictated  with voice recognition software. Similar sounding words can  inadvertently be transcribed and may not be corrected upon review.

## 2018-09-22 NOTE — Patient Instructions (Signed)
[place] Discharge Instructions for Patients Receiving Chemotherapy  Today you received the following chemotherapy agents Tecentriq  To help prevent nausea and vomiting after your treatment, we encourage you to take your nausea medication as prescribed  If you develop nausea and vomiting that is not controlled by your nausea medication, call the clinic.   BELOW ARE SYMPTOMS THAT SHOULD BE REPORTED IMMEDIATELY:  *FEVER GREATER THAN 100.5 F  *CHILLS WITH OR WITHOUT FEVER  NAUSEA AND VOMITING THAT IS NOT CONTROLLED WITH YOUR NAUSEA MEDICATION  *UNUSUAL SHORTNESS OF BREATH  *UNUSUAL BRUISING OR BLEEDING  TENDERNESS IN MOUTH AND THROAT WITH OR WITHOUT PRESENCE OF ULCERS  *URINARY PROBLEMS  *BOWEL PROBLEMS  UNUSUAL RASH Items with * indicate a potential emergency and should be followed up as soon as possible.  Feel free to call the clinic should you have any questions or concerns. The clinic phone number is [phone].  Please show the [place] at check-in to the Emergency Department and triage nurse.

## 2018-09-22 NOTE — Addendum Note (Signed)
Addended by: Neysa Hotter on: 09/22/2018 03:55 PM   Modules accepted: Orders

## 2018-09-22 NOTE — Progress Notes (Signed)
Pt tolerated today's Tecentriq infusion without concern per RN. Will change the infusion time on future Tecentriq orders to infuse over 30 minutes.   Hardie Pulley, PharmD, BCPS, BCOP

## 2018-09-22 NOTE — Progress Notes (Signed)
Ok to treat with CMP today per Arnoldo Lenis, RN per MD [facility]

## 2018-10-10 ENCOUNTER — Other Ambulatory Visit: Payer: Self-pay | Admitting: *Deleted

## 2018-10-10 DIAGNOSIS — C3490 Malignant neoplasm of unspecified part of unspecified bronchus or lung: Secondary | ICD-10-CM

## 2018-10-13 ENCOUNTER — Inpatient Hospital Stay: Payer: Medicare HMO

## 2018-10-13 ENCOUNTER — Inpatient Hospital Stay (HOSPITAL_BASED_OUTPATIENT_CLINIC_OR_DEPARTMENT_OTHER): Payer: Medicare HMO | Admitting: Oncology

## 2018-10-13 ENCOUNTER — Telehealth: Payer: Self-pay | Admitting: Oncology

## 2018-10-13 ENCOUNTER — Inpatient Hospital Stay: Payer: Medicare HMO | Attending: Internal Medicine

## 2018-10-13 ENCOUNTER — Encounter: Payer: Self-pay | Admitting: Oncology

## 2018-10-13 VITALS — BP 119/75 | HR 83 | Temp 97.5°F | Resp 18 | Ht 70.0 in | Wt 159.4 lb

## 2018-10-13 DIAGNOSIS — C3411 Malignant neoplasm of upper lobe, right bronchus or lung: Secondary | ICD-10-CM

## 2018-10-13 DIAGNOSIS — C342 Malignant neoplasm of middle lobe, bronchus or lung: Secondary | ICD-10-CM

## 2018-10-13 DIAGNOSIS — Z5112 Encounter for antineoplastic immunotherapy: Secondary | ICD-10-CM | POA: Diagnosis not present

## 2018-10-13 DIAGNOSIS — Z79899 Other long term (current) drug therapy: Secondary | ICD-10-CM | POA: Insufficient documentation

## 2018-10-13 DIAGNOSIS — C3490 Malignant neoplasm of unspecified part of unspecified bronchus or lung: Secondary | ICD-10-CM

## 2018-10-13 DIAGNOSIS — R5382 Chronic fatigue, unspecified: Secondary | ICD-10-CM

## 2018-10-13 LAB — CBC WITH DIFFERENTIAL (CANCER CENTER ONLY)
ABS IMMATURE GRANULOCYTES: 0.01 10*3/uL (ref 0.00–0.07)
BASOS ABS: 0 10*3/uL (ref 0.0–0.1)
Basophils Relative: 1 %
EOS PCT: 2 %
Eosinophils Absolute: 0.1 10*3/uL (ref 0.0–0.5)
HEMATOCRIT: 35.9 % — AB (ref 39.0–52.0)
HEMOGLOBIN: 11.5 g/dL — AB (ref 13.0–17.0)
Immature Granulocytes: 0 %
LYMPHS ABS: 1 10*3/uL (ref 0.7–4.0)
Lymphocytes Relative: 17 %
MCH: 31.3 pg (ref 26.0–34.0)
MCHC: 32 g/dL (ref 30.0–36.0)
MCV: 97.6 fL (ref 80.0–100.0)
MONO ABS: 0.5 10*3/uL (ref 0.1–1.0)
Monocytes Relative: 9 %
NEUTROS ABS: 4 10*3/uL (ref 1.7–7.7)
NRBC: 0 % (ref 0.0–0.2)
Neutrophils Relative %: 71 %
Platelet Count: 212 10*3/uL (ref 150–400)
RBC: 3.68 MIL/uL — AB (ref 4.22–5.81)
RDW: 13.7 % (ref 11.5–15.5)
WBC: 5.6 10*3/uL (ref 4.0–10.5)

## 2018-10-13 LAB — CMP (CANCER CENTER ONLY)
ALBUMIN: 3 g/dL — AB (ref 3.5–5.0)
ALT: 10 U/L (ref 0–44)
AST: 11 U/L — AB (ref 15–41)
Alkaline Phosphatase: 83 U/L (ref 38–126)
Anion gap: 10 (ref 5–15)
BUN: 26 mg/dL — AB (ref 8–23)
CHLORIDE: 108 mmol/L (ref 98–111)
CO2: 24 mmol/L (ref 22–32)
CREATININE: 2.05 mg/dL — AB (ref 0.61–1.24)
Calcium: 9.9 mg/dL (ref 8.9–10.3)
GFR, EST NON AFRICAN AMERICAN: 31 mL/min — AB (ref >60)
GFR, Est AFR Am: 35 mL/min — ABNORMAL LOW (ref >60)
Glucose, Bld: 97 mg/dL (ref 70–99)
POTASSIUM: 4.4 mmol/L (ref 3.5–5.1)
SODIUM: 142 mmol/L (ref 135–145)
Total Bilirubin: 0.4 mg/dL (ref 0.3–1.2)
Total Protein: 6.8 g/dL (ref 6.5–8.1)

## 2018-10-13 LAB — TSH

## 2018-10-13 MED ORDER — SODIUM CHLORIDE 0.9 % IV SOLN
Freq: Once | INTRAVENOUS | Status: AC
  Administered 2018-10-13: 13:00:00 via INTRAVENOUS
  Filled 2018-10-13: qty 250

## 2018-10-13 MED ORDER — SODIUM CHLORIDE 0.9 % IV SOLN
1200.0000 mg | Freq: Once | INTRAVENOUS | Status: AC
  Administered 2018-10-13: 1200 mg via INTRAVENOUS
  Filled 2018-10-13: qty 20

## 2018-10-13 NOTE — Progress Notes (Signed)
Christopher Wilson  Patient, No Pcp Per No address on file  DIAGNOSIS: Extensive stage (T4, N2, M1a) small cell lung cancer presented with large right perihilar mass with mediastinal invasion and loculated malignant right pleural effusion diagnosed in June 2019.  PRIOR THERAPY:None.  CURRENT THERAPY: Systemic chemotherapy with carboplatin for AUC of 5 on day 1, etoposide 100 mg/M2 on days 1, 2 and 3 in addition to Tecentriq 1200 mg IV every 3 weeks with Neulasta support.  Status post 8 cycles.  Starting from cycle #6 the patient will be treated with maintenance single agent Tecentriq every 3 weeks  INTERVAL HISTORY: Christopher Wilson 77 y.o. male returns for routine follow-up visit by himself.  The patient is feeling fine today and has no specific complaints that for ongoing shortness of breath with exertion and cough which is unchanged from baseline.  He denies fevers and chills.  Denies chest pain and hemoptysis.  Denies nausea, vomiting, constipation, diarrhea.  Denies recent weight loss or night sweats.  He continues to tolerate treatment with Tecentriq fairly well.  The patient is here for evaluation prior to cycle #9 of his treatment.  MEDICAL HISTORY: Past Medical History:  Diagnosis Date  . ACS (acute coronary syndrome), with ST elevation but stable coronary arteries on cath 06/27/12 06/27/2012  . CAD (coronary artery disease), with CABG 1989 and re-do Cabg 2002 X 3 vesssels.  progressive disease with Stent to Lt. Main, and 1st diag 2006, stent to VG to LCX 2007, with known occlusion history o  06/27/2012  . CKD (chronic kidney disease) stage 3, GFR 30-59 ml/min (HCC) 06/27/2012  . 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 Coronary artery disease   . GERD (gastroesophageal reflux disease)   . Hyperlipidemia LDL goal < 70 06/27/2012  . Hypertension   . Myocardial infarction ([place])   . Peptic ulcer   . STEMI (ST elevation myocardial infarction) ([place]) 06/27/2012  .  Tobacco abuse 06/27/2012    ALLERGIES:  has No Known Allergies.  MEDICATIONS:  Current Outpatient Medications  Medication Sig Dispense Refill  . acetaminophen (TYLENOL) 325 MG tablet Take 650 mg by mouth every 6 (six) hours as needed for moderate pain.    Marland Kitchen albuterol (PROVENTIL HFA;VENTOLIN HFA) 108 (90 Base) MCG/ACT inhaler Inhale 2 puffs into the lungs every 6 (six) hours as needed for wheezing or shortness of breath.    Marland Kitchen atorvastatin (LIPITOR) 80 MG tablet TAKE ONE TABLET BY MOUTH DAILY AT 6 P.M. NEEDS APPOINTMENT FOR FUTURE REFILLS. (Patient taking differently: Take 80 mg by mouth daily at 6 PM. ) 90 tablet 3  . isosorbide mononitrate (IMDUR) 30 MG 24 hr tablet Take 1 tablet (30 mg total) by mouth daily. 90 tablet 3  . metoprolol tartrate (LOPRESSOR) 25 MG tablet Take 1 tablet (25 mg total) by mouth 2 (two) times daily. 60 tablet 3  . rivaroxaban (XARELTO) 20 MG TABS tablet Take 1 tablet (20 mg total) by mouth daily with supper. 30 tablet 1  . nitroGLYCERIN (NITROSTAT) 0.4 MG SL tablet Place 1 tablet (0.4 mg total) under the tongue every 5 (five) minutes as needed for chest pain. (Patient not taking: Reported on 10/13/2018) 25 tablet 3  . prochlorperazine (COMPAZINE) 10 MG tablet Take 1 tablet (10 mg total) by mouth every 6 (six) hours as needed for nausea or vomiting. (Patient not taking: Reported on 10/13/2018) 30 tablet 0   No current facility-administered medications for this visit.     SURGICAL HISTORY:  Past Surgical History:  Procedure Laterality Date  . coratid arterty surgery    . CORONARY ANGIOPLASTY WITH STENT PLACEMENT    . CORONARY ARTERY BYPASS GRAFT    . IR GUIDED DRAIN W CATHETER PLACEMENT  04/08/2018  . IR REMOVAL OF PLURAL CATH W/CUFF  06/19/2018  . LCEA    . LEFT HEART CATH N/A 06/27/2012   Procedure: LEFT HEART CATH;  Surgeon: Troy Sine, MD;  Location: [facility] CATH LAB;  Service: Cardiovascular;  Laterality: N/A;  . triple bipass     Hx of CABG and RE do  .  VASCULAR SURGERY    . VIDEO BRONCHOSCOPY Bilateral 04/07/2018   Procedure: VIDEO BRONCHOSCOPY WITHOUT FLUORO;  Surgeon: Rush Farmer, MD;  Location: [facility] ENDOSCOPY;  Service: Cardiopulmonary;  Laterality: Bilateral;    REVIEW OF SYSTEMS:   Review of Systems  Constitutional: Negative for appetite change, chills, fatigue, fever and unexpected weight change.  HENT:   Negative for mouth sores, nosebleeds, sore throat and trouble swallowing.   Eyes: Negative for eye problems and icterus.  Respiratory: Negative for hemoptysis and wheezing.  Positive for cough and shortness of breath with exertion. Cardiovascular: Negative for chest pain and leg swelling.  Gastrointestinal: Negative for abdominal pain, constipation, diarrhea, nausea and vomiting.  Genitourinary: Negative for bladder incontinence, difficulty urinating, dysuria, frequency and hematuria.   Musculoskeletal: Negative for back pain, gait problem, neck pain and neck stiffness.  Skin: Negative for itching and rash.  Neurological: Negative for dizziness, extremity weakness, gait problem, headaches, light-headedness and seizures.  Hematological: Negative for adenopathy. Does not bruise/bleed easily.  Psychiatric/Behavioral: Negative for confusion, depression and sleep disturbance. The patient is not nervous/anxious.     PHYSICAL EXAMINATION:  Blood pressure 119/75, pulse 83, temperature (!) 97.5 F (36.4 C), temperature source Oral, resp. rate 18, height 5\' 10"  (1.778 m), weight 159 lb 6.4 oz (72.3 kg), SpO2 97 %.  ECOG PERFORMANCE STATUS: 1 - Symptomatic but completely ambulatory  Physical Exam  Constitutional: Oriented to person, place, and time and well-developed, well-nourished, and in no distress. No distress.  HENT:  Head: Normocephalic and atraumatic.  Mouth/Throat: Oropharynx is clear and moist. No oropharyngeal exudate.  Eyes: Conjunctivae are normal. Right eye exhibits no discharge. Left eye exhibits no discharge. No scleral  icterus.  Neck: Normal range of motion. Neck supple.  Cardiovascular: Normal rate, regular rhythm, normal heart sounds and intact distal pulses.   Pulmonary/Chest: Effort normal. No respiratory distress.  Scattered rhonchi noted throughout his posterior lung fields. Abdominal: Soft. Bowel sounds are normal. Exhibits no distension and no mass. There is no tenderness.  Musculoskeletal: Normal range of motion. Exhibits no edema.  Lymphadenopathy:    No cervical adenopathy.  Neurological: Alert and oriented to person, place, and time. Exhibits normal muscle tone. Gait normal. Coordination normal.  Skin: Skin is warm and dry. No rash noted. Not diaphoretic. No erythema. No pallor.  Psychiatric: Mood, memory and judgment normal.  Vitals reviewed.  LABORATORY DATA: Lab Results  Component Value Date   WBC 5.6 10/13/2018   HGB 11.5 (L) 10/13/2018   HCT 35.9 (L) 10/13/2018   MCV 97.6 10/13/2018   PLT 212 10/13/2018      Chemistry      Component Value Date/Time   NA 142 10/13/2018 1119   NA 140 03/21/2018 1048   K 4.4 10/13/2018 1119   CL 108 10/13/2018 1119   CO2 24 10/13/2018 1119   BUN 26 (H) 10/13/2018 1119   BUN 45 (H)  03/21/2018 1048   CREATININE 2.05 (H) 10/13/2018 1119   CREATININE 1.80 (H) 09/06/2014 1033      Component Value Date/Time   CALCIUM 9.9 10/13/2018 1119   ALKPHOS 83 10/13/2018 1119   AST 11 (L) 10/13/2018 1119   ALT 10 10/13/2018 1119   BILITOT 0.4 10/13/2018 1119       RADIOGRAPHIC STUDIES:  No results found.   ASSESSMENT/PLAN:  Small cell carcinoma of upper lobe of right lung [facility] This is a very pleasant 77 year old white male recently diagnosed with extensive stage small cell lung cancer and currently undergoing systemic chemotherapy with carboplatin, etoposide and Tecentriq status post 5 cycles. He is also status post 3 cycles of maintenance treatment with single agent Tecentriq.   He continues to tolerate this treatment well with no concerning  adverse effects. I recommended for the patient to proceed with cycle #4 of the maintenance Tecentriq today as scheduled.  Creatinine is 2.05 which is consistent with his baseline and adequate to proceed with treatment as scheduled.  He will have a restaging CT scan of the chest, abdomen, pelvis prior to his next visit.  The patient will follow-up in 3 weeks for evaluation prior to his next cycle of maintenance Tecentriq and to review his restaging CT scan results.  He was advised to call immediately if he has any concerning symptoms in the interval. The patient voices understanding of current disease status and treatment options and is in agreement with the current care plan.  All questions were answered. The patient knows to call the clinic with any problems, questions or concerns. We can certainly see the patient much sooner if necessary.   Orders Placed This Encounter  Procedures  . CT ABDOMEN PELVIS W CONTRAST    Standing Status:   Future    Standing Expiration Date:   10/14/2019    Order Specific Question:   If indicated for the ordered procedure, I authorize the administration of contrast media per Radiology protocol    Answer:   Yes    Order Specific Question:   Preferred imaging location?    Answer:   [facility]    Order Specific Question:   Radiology Contrast Protocol - do NOT remove file path    Answer:   [email or web address]    Order Specific Question:   ** REASON FOR EXAM (FREE TEXT)    Answer:   Extensive stage smalle cell lung cancer. Restaging.  . CT CHEST W CONTRAST    Standing Status:   Future    Standing Expiration Date:   10/14/2019    Order Specific Question:   If indicated for the ordered procedure, I authorize the administration of contrast media per Radiology protocol    Answer:   Yes    Order Specific Question:   Preferred imaging location?    Answer:   [facility]    Order Specific Question:   Radiology Contrast  Protocol - do NOT remove file path    Answer:   [email or web address]    Order Specific Question:   ** REASON FOR EXAM (FREE TEXT)    Answer:   Extensive stage smalle cell lung cancer. Restaging.  Marland Kitchen CBC with Differential (Cancer Center Only)    Standing Status:   Standing    Number of Occurrences:   20    Standing Expiration Date:   10/14/2019  . CMP ([place] only)    Standing Status:   Standing    Number of Occurrences:  20    Standing Expiration Date:   10/14/2019     Mikey Bussing, DNP, AGPCNP-BC, AOCNP 10/13/18

## 2018-10-13 NOTE — Telephone Encounter (Signed)
R/s already scheduled for 3 cycles - per 12/16 los - central radiology to contact patient for scan appt

## 2018-10-13 NOTE — Assessment & Plan Note (Addendum)
This is a very pleasant 77 year old white male recently diagnosed with extensive stage small cell lung cancer and currently undergoing systemic chemotherapy with carboplatin, etoposide and Tecentriq status post 5 cycles. He is also status post 3 cycles of maintenance treatment with single agent Tecentriq.   He continues to tolerate this treatment well with no concerning adverse effects. I recommended for the patient to proceed with cycle #4 of the maintenance Tecentriq today as scheduled.  Creatinine is 2.05 which is consistent with his baseline and adequate to proceed with treatment as scheduled.  He will have a restaging CT scan of the chest, abdomen, pelvis prior to his next visit.  The patient will follow-up in 3 weeks for evaluation prior to his next cycle of maintenance Tecentriq and to review his restaging CT scan results.  He was advised to call immediately if he has any concerning symptoms in the interval. The patient voices understanding of current disease status and treatment options and is in agreement with the current care plan.  All questions were answered. The patient knows to call the clinic with any problems, questions or concerns. We can certainly see the patient much sooner if necessary.

## 2018-10-13 NOTE — Patient Instructions (Signed)
[facility] Discharge Instructions for Patients Receiving Chemotherapy  Today you received the following chemotherapy agents Atezolizumab [facility]).  To help prevent nausea and vomiting after your treatment, we encourage you to take your nausea medication as prescribed.  If you develop nausea and vomiting that is not controlled by your nausea medication, call the clinic.   BELOW ARE SYMPTOMS THAT SHOULD BE REPORTED IMMEDIATELY:  *FEVER GREATER THAN 100.5 F  *CHILLS WITH OR WITHOUT FEVER  NAUSEA AND VOMITING THAT IS NOT CONTROLLED WITH YOUR NAUSEA MEDICATION  *UNUSUAL SHORTNESS OF BREATH  *UNUSUAL BRUISING OR BLEEDING  TENDERNESS IN MOUTH AND THROAT WITH OR WITHOUT PRESENCE OF ULCERS  *URINARY PROBLEMS  *BOWEL PROBLEMS  UNUSUAL RASH Items with * indicate a potential emergency and should be followed up as soon as possible.  Feel free to call the clinic should you have any questions or concerns. The clinic phone number is [phone].  Please show the [place] at check-in to the Emergency Department and triage nurse.

## 2018-10-28 ENCOUNTER — Other Ambulatory Visit: Payer: Self-pay | Admitting: Medical Oncology

## 2018-10-28 DIAGNOSIS — C3411 Malignant neoplasm of upper lobe, right bronchus or lung: Secondary | ICD-10-CM

## 2018-10-30 ENCOUNTER — Ambulatory Visit (HOSPITAL_COMMUNITY)
Admission: RE | Admit: 2018-10-30 | Discharge: 2018-10-30 | Disposition: A | Discharge status: Home / Self Care | Payer: Medicare HMO | Source: Ambulatory Visit | Attending: Oncology | Admitting: Oncology

## 2018-10-30 DIAGNOSIS — C3412 Malignant neoplasm of upper lobe, left bronchus or lung: Secondary | ICD-10-CM | POA: Diagnosis not present

## 2018-10-30 DIAGNOSIS — N200 Calculus of kidney: Secondary | ICD-10-CM | POA: Diagnosis not present

## 2018-10-30 DIAGNOSIS — C3411 Malignant neoplasm of upper lobe, right bronchus or lung: Secondary | ICD-10-CM | POA: Diagnosis not present

## 2018-11-03 ENCOUNTER — Inpatient Hospital Stay (HOSPITAL_BASED_OUTPATIENT_CLINIC_OR_DEPARTMENT_OTHER): Payer: Medicare HMO | Admitting: Internal Medicine

## 2018-11-03 ENCOUNTER — Inpatient Hospital Stay: Payer: Medicare HMO

## 2018-11-03 ENCOUNTER — Encounter: Payer: Self-pay | Admitting: Internal Medicine

## 2018-11-03 ENCOUNTER — Inpatient Hospital Stay: Payer: Medicare HMO | Attending: Internal Medicine

## 2018-11-03 ENCOUNTER — Telehealth: Payer: Self-pay | Admitting: Internal Medicine

## 2018-11-03 VITALS — BP 97/63 | HR 73 | Temp 97.7°F | Resp 20 | Ht 70.0 in | Wt 158.3 lb

## 2018-11-03 DIAGNOSIS — C3411 Malignant neoplasm of upper lobe, right bronchus or lung: Secondary | ICD-10-CM

## 2018-11-03 DIAGNOSIS — Z79899 Other long term (current) drug therapy: Secondary | ICD-10-CM | POA: Insufficient documentation

## 2018-11-03 DIAGNOSIS — Z5112 Encounter for antineoplastic immunotherapy: Secondary | ICD-10-CM | POA: Diagnosis not present

## 2018-11-03 DIAGNOSIS — J910 Malignant pleural effusion: Secondary | ICD-10-CM | POA: Diagnosis not present

## 2018-11-03 DIAGNOSIS — C342 Malignant neoplasm of middle lobe, bronchus or lung: Secondary | ICD-10-CM

## 2018-11-03 DIAGNOSIS — Z720 Tobacco use: Secondary | ICD-10-CM

## 2018-11-03 DIAGNOSIS — R911 Solitary pulmonary nodule: Secondary | ICD-10-CM | POA: Diagnosis not present

## 2018-11-03 DIAGNOSIS — R5382 Chronic fatigue, unspecified: Secondary | ICD-10-CM

## 2018-11-03 LAB — CBC WITH DIFFERENTIAL (CANCER CENTER ONLY)
Abs Immature Granulocytes: 0.01 10*3/uL (ref 0.00–0.07)
Basophils Absolute: 0 10*3/uL (ref 0.0–0.1)
Basophils Relative: 1 %
Eosinophils Absolute: 0.3 10*3/uL (ref 0.0–0.5)
Eosinophils Relative: 5 %
HCT: 39 % (ref 39.0–52.0)
Hemoglobin: 12.4 g/dL — ABNORMAL LOW (ref 13.0–17.0)
Immature Granulocytes: 0 %
LYMPHS ABS: 0.9 10*3/uL (ref 0.7–4.0)
Lymphocytes Relative: 16 %
MCH: 30.3 pg (ref 26.0–34.0)
MCHC: 31.8 g/dL (ref 30.0–36.0)
MCV: 95.4 fL (ref 80.0–100.0)
Monocytes Absolute: 0.5 10*3/uL (ref 0.1–1.0)
Monocytes Relative: 8 %
NRBC: 0 % (ref 0.0–0.2)
Neutro Abs: 4 10*3/uL (ref 1.7–7.7)
Neutrophils Relative %: 70 %
Platelet Count: 182 10*3/uL (ref 150–400)
RBC: 4.09 MIL/uL — ABNORMAL LOW (ref 4.22–5.81)
RDW: 13.3 % (ref 11.5–15.5)
WBC Count: 5.8 10*3/uL (ref 4.0–10.5)

## 2018-11-03 LAB — CMP (CANCER CENTER ONLY)
ALT: 8 U/L (ref 0–44)
AST: 13 U/L — ABNORMAL LOW (ref 15–41)
Albumin: 3.1 g/dL — ABNORMAL LOW (ref 3.5–5.0)
Alkaline Phosphatase: 84 U/L (ref 38–126)
Anion gap: 8 (ref 5–15)
BUN: 24 mg/dL — ABNORMAL HIGH (ref 8–23)
CO2: 25 mmol/L (ref 22–32)
Calcium: 9.8 mg/dL (ref 8.9–10.3)
Chloride: 110 mmol/L (ref 98–111)
Creatinine: 2.1 mg/dL — ABNORMAL HIGH (ref 0.61–1.24)
GFR, EST AFRICAN AMERICAN: 34 mL/min — AB (ref >60)
GFR, Estimated: 29 mL/min — ABNORMAL LOW (ref >60)
Glucose, Bld: 91 mg/dL (ref 70–99)
Potassium: 4.6 mmol/L (ref 3.5–5.1)
Sodium: 143 mmol/L (ref 135–145)
Total Bilirubin: 0.4 mg/dL (ref 0.3–1.2)
Total Protein: 6.7 g/dL (ref 6.5–8.1)

## 2018-11-03 LAB — TSH: TSH: <0.08 u[IU]/mL — ABNORMAL LOW (ref 0.320–4.118)

## 2018-11-03 MED ORDER — SODIUM CHLORIDE 0.9 % IV SOLN
1200.0000 mg | Freq: Once | INTRAVENOUS | Status: AC
  Administered 2018-11-03: 1200 mg via INTRAVENOUS
  Filled 2018-11-03: qty 20

## 2018-11-03 MED ORDER — SODIUM CHLORIDE 0.9 % IV SOLN
Freq: Once | INTRAVENOUS | Status: AC
  Administered 2018-11-03: 12:00:00 via INTRAVENOUS
  Filled 2018-11-03: qty 250

## 2018-11-03 NOTE — Progress Notes (Signed)
[place] [phone]   [phone]  OFFICE PROGRESS NOTE  Patient, No Pcp Per No address on file  DIAGNOSIS: Extensive stage (T4, N2, M1a) small cell lung cancer presented with large right perihilar mass with mediastinal invasion and loculated malignant right pleural effusion diagnosed in June 2019.  PRIOR THERAPY:None.  CURRENT THERAPY: Systemic chemotherapy with carboplatin for AUC of 5 on day 1, etoposide 100 mg/M2 on days 1, 2 and 3 in addition to Tecentriq 1200 mg IV every 3 weeks with Neulasta support.  Status post 9 cycles.  Starting from cycle #6 the patient will be treated with maintenance single agent Tecentriq every 3 weeks  INTERVAL HISTORY: Christopher Wilson 78 y.o. male returns to the clinic today for follow-up visit.  The patient is feeling fine today with no concerning complaints.  He has been tolerating his treatment with Tecentriq fairly well.  He denied having any chest pain, shortness of breath, cough or hemoptysis.  He denied having any fever or chills.  He has no nausea, vomiting, diarrhea or constipation.  He has no concerning skin rash.  The patient had repeat CT scan of the chest, abdomen and pelvis performed recently and he is here for evaluation and discussion of his scan results.   MEDICAL HISTORY: Past Medical History:  Diagnosis Date  . ACS (acute coronary syndrome), with ST elevation but stable coronary arteries on cath 06/27/12 06/27/2012  . CAD (coronary artery disease), with CABG 1989 and re-do Cabg 2002 X 3 vesssels.  progressive disease with Stent to Lt. Main, and 1st diag 2006, stent to VG to LCX 2007, with known occlusion history o  06/27/2012  . CKD (chronic kidney disease) stage 3, GFR 30-59 ml/min (HCC) 06/27/2012  . 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 Coronary artery disease   . GERD (gastroesophageal reflux disease)   . Hyperlipidemia LDL goal < 70 06/27/2012  . Hypertension   . Myocardial infarction ([place])     . Peptic ulcer   . STEMI (ST elevation myocardial infarction) ([place]) 06/27/2012  . Tobacco abuse 06/27/2012    ALLERGIES:  has No Known Allergies.  MEDICATIONS:  Current Outpatient Medications  Medication Sig Dispense Refill  . acetaminophen (TYLENOL) 325 MG tablet Take 650 mg by mouth every 6 (six) hours as needed for moderate pain.    Marland Kitchen albuterol (PROVENTIL HFA;VENTOLIN HFA) 108 (90 Base) MCG/ACT inhaler Inhale 2 puffs into the lungs every 6 (six) hours as needed for wheezing or shortness of breath.    Marland Kitchen atorvastatin (LIPITOR) 80 MG tablet TAKE ONE TABLET BY MOUTH DAILY AT 6 P.M. NEEDS APPOINTMENT FOR FUTURE REFILLS. (Patient taking differently: Take 80 mg by mouth daily at 6 PM. ) 90 tablet 3  . isosorbide mononitrate (IMDUR) 30 MG 24 hr tablet Take 1 tablet (30 mg total) by mouth daily. 90 tablet 3  . metoprolol tartrate (LOPRESSOR) 25 MG tablet Take 1 tablet (25 mg total) by mouth 2 (two) times daily. 60 tablet 3  . nitroGLYCERIN (NITROSTAT) 0.4 MG SL tablet Place 1 tablet (0.4 mg total) under the tongue every 5 (five) minutes as needed for chest pain. (Patient not taking: Reported on 10/13/2018) 25 tablet 3  . prochlorperazine (COMPAZINE) 10 MG tablet Take 1 tablet (10 mg total) by mouth every 6 (six) hours as needed for nausea or vomiting. (Patient not taking: Reported on 10/13/2018) 30 tablet 0  . rivaroxaban (XARELTO) 20 MG TABS tablet Take 1 tablet (20 mg total)  by mouth daily with supper. 30 tablet 1   No current facility-administered medications for this visit.     SURGICAL HISTORY:  Past Surgical History:  Procedure Laterality Date  . coratid arterty surgery    . CORONARY ANGIOPLASTY WITH STENT PLACEMENT    . CORONARY ARTERY BYPASS GRAFT    . IR GUIDED DRAIN W CATHETER PLACEMENT  04/08/2018  . IR REMOVAL OF PLURAL CATH W/CUFF  06/19/2018  . LCEA    . LEFT HEART CATH N/A 06/27/2012   Procedure: LEFT HEART CATH;  Surgeon: Troy Sine, MD;  Location: [facility] CATH LAB;  Service:  Cardiovascular;  Laterality: N/A;  . triple bipass     Hx of CABG and RE do  . VASCULAR SURGERY    . VIDEO BRONCHOSCOPY Bilateral 04/07/2018   Procedure: VIDEO BRONCHOSCOPY WITHOUT FLUORO;  Surgeon: Rush Farmer, MD;  Location: [facility] ENDOSCOPY;  Service: Cardiopulmonary;  Laterality: Bilateral;    REVIEW OF SYSTEMS:  Constitutional: negative Eyes: negative Ears, nose, mouth, throat, and face: negative Respiratory: negative Cardiovascular: negative Gastrointestinal: negative Genitourinary:negative Integument/breast: negative Hematologic/lymphatic: negative Musculoskeletal:negative Neurological: negative Behavioral/Psych: negative Endocrine: negative Allergic/Immunologic: negative   PHYSICAL EXAMINATION: General appearance: alert, cooperative and no distress Head: Normocephalic, without obvious abnormality, atraumatic Neck: no adenopathy, no JVD, supple, symmetrical, trachea midline and thyroid not enlarged, symmetric, no tenderness/mass/nodules Lymph nodes: Cervical, supraclavicular, and axillary nodes normal. Resp: clear to auscultation bilaterally Back: symmetric, no curvature. ROM normal. No CVA tenderness. Cardio: regular rate and rhythm, S1, S2 normal, no murmur, click, rub or gallop GI: soft, non-tender; bowel sounds normal; no masses,  no organomegaly Extremities: extremities normal, atraumatic, no cyanosis or edema Neurologic: Alert and oriented X 3, normal strength and tone. Normal symmetric reflexes. Normal coordination and gait  ECOG PERFORMANCE STATUS: 1 - Symptomatic but completely ambulatory  Blood pressure 97/63, pulse 73, temperature 97.7 F (36.5 C), temperature source Oral, resp. rate 20, height 5\' 10"  (1.778 m), weight 158 lb 4.8 oz (71.8 kg), SpO2 97 %.  LABORATORY DATA: Lab Results  Component Value Date   WBC 5.8 11/03/2018   HGB 12.4 (L) 11/03/2018   HCT 39.0 11/03/2018   MCV 95.4 11/03/2018   PLT 182 11/03/2018      Chemistry      Component  Value Date/Time   NA 142 10/13/2018 1119   NA 140 03/21/2018 1048   K 4.4 10/13/2018 1119   CL 108 10/13/2018 1119   CO2 24 10/13/2018 1119   BUN 26 (H) 10/13/2018 1119   BUN 45 (H) 03/21/2018 1048   CREATININE 2.05 (H) 10/13/2018 1119   CREATININE 1.80 (H) 09/06/2014 1033      Component Value Date/Time   CALCIUM 9.9 10/13/2018 1119   ALKPHOS 83 10/13/2018 1119   AST 11 (L) 10/13/2018 1119   ALT 10 10/13/2018 1119   BILITOT 0.4 10/13/2018 1119       RADIOGRAPHIC STUDIES: Ct Abdomen Pelvis Wo Contrast  Result Date: 10/30/2018 CLINICAL DATA:  Extensive stage small cell lung cancer. Follow-up exam. EXAM: CT CHEST, ABDOMEN AND PELVIS WITHOUT CONTRAST TECHNIQUE: Multidetector CT imaging of the chest, abdomen and pelvis was performed following the standard protocol without IV contrast. COMPARISON:  08/29/2018 FINDINGS: CT CHEST FINDINGS Cardiovascular: Normal heart size. Aortic atherosclerosis. Previous median sternotomy and CABG procedure. No pericardial effusion. Mediastinum/Nodes: Normal appearance of the thyroid gland. The trachea appears patent and is midline. Right paratracheal lymph node is stable measuring 7 mm. No mediastinal or hilar adenopathy identified. No axillary  or supraclavicular adenopathy. Lungs/Pleura: No pleural effusion. Moderate changes of emphysema. The partially calcified spiculated nodule in the right lung apex measures 1.3 cm, image 29/4. Previously 1.5 cm. The large perifissural mass involving the right upper lobe and right middle lobe is again identified. On today's study this measures 2.7 by 3.4 cm, image 33/2. Previously 2.9 x 3.8 cm. Nodule within the posterior right upper lobe measures 4 mm, image 69/4. Previously 5 mm. New tiny nodule within the left upper lobe is noted measuring 3 mm. Musculoskeletal: Spondylosis identified within the thoracic spine. No aggressive lytic or sclerotic bone lesions. CT ABDOMEN PELVIS FINDINGS Hepatobiliary: Low-density structure in  left lobe of liver is unchanged measuring 9 mm. No suspicious liver abnormalities. Gallbladder normal. No biliary ductal dilatation. Pancreas: Unremarkable. No pancreatic ductal dilatation or surrounding inflammatory changes. Spleen: Normal in size without focal abnormality. Adrenals/Urinary Tract: Normal appearance of the adrenal glands. Right kidney atrophy. Multiple left renal calculi are again identified. No hydronephrosis. Multiple left kidney lesions are again noted of varying density. These are incompletely characterized without IV contrast. Urinary bladder appears normal. Stomach/Bowel: Stomach appears normal. The small bowel loops have a normal course and caliber without obstruction. The appendix is visualized and appears normal. Numerous distal colonic diverticula noted without acute inflammation. Vascular/Lymphatic: Aortic atherosclerosis. Infrarenal abdominal aortic aneurysm measures 3.6 cm, image 67/5. Stable. No abdominopelvic adenopathy identified. Reproductive: Prostate is unremarkable. Other: No abdominal wall hernia or abnormality. No abdominopelvic ascites. Musculoskeletal: No acute or significant osseous findings. IMPRESSION: 1. Continued decrease in size right middle/upper lobe lung mass. No specific findings identified to suggest new or progressive disease. 2. New tiny 3 mm nodule is identified in the left upper lobe, nonspecific. Attention to this nodule on follow-up imaging is recommended. 3. Aortic Atherosclerosis (ICD10-I70.0) and Emphysema (ICD10-J43.9). 4. Infrarenal abdominal aortic aneurysm. Recommend followup by ultrasound in 2 years. This recommendation follows ACR consensus guidelines: White Paper of the ACR Incidental Findings Committee II on Vascular Findings. J Am Coll Radiol 2013; 10:789-794. Aortic aneurysm NOS (ICD10-I71.9). Electronically Signed   By: Kerby Moors M.D.   On: 10/30/2018 14:58   Ct Chest Wo Contrast  Result Date: 10/30/2018 CLINICAL DATA:  Extensive stage  small cell lung cancer. Follow-up exam. EXAM: CT CHEST, ABDOMEN AND PELVIS WITHOUT CONTRAST TECHNIQUE: Multidetector CT imaging of the chest, abdomen and pelvis was performed following the standard protocol without IV contrast. COMPARISON:  08/29/2018 FINDINGS: CT CHEST FINDINGS Cardiovascular: Normal heart size. Aortic atherosclerosis. Previous median sternotomy and CABG procedure. No pericardial effusion. Mediastinum/Nodes: Normal appearance of the thyroid gland. The trachea appears patent and is midline. Right paratracheal lymph node is stable measuring 7 mm. No mediastinal or hilar adenopathy identified. No axillary or supraclavicular adenopathy. Lungs/Pleura: No pleural effusion. Moderate changes of emphysema. The partially calcified spiculated nodule in the right lung apex measures 1.3 cm, image 29/4. Previously 1.5 cm. The large perifissural mass involving the right upper lobe and right middle lobe is again identified. On today's study this measures 2.7 by 3.4 cm, image 33/2. Previously 2.9 x 3.8 cm. Nodule within the posterior right upper lobe measures 4 mm, image 69/4. Previously 5 mm. New tiny nodule within the left upper lobe is noted measuring 3 mm. Musculoskeletal: Spondylosis identified within the thoracic spine. No aggressive lytic or sclerotic bone lesions. CT ABDOMEN PELVIS FINDINGS Hepatobiliary: Low-density structure in left lobe of liver is unchanged measuring 9 mm. No suspicious liver abnormalities. Gallbladder normal. No biliary ductal dilatation. Pancreas: Unremarkable. No pancreatic ductal  dilatation or surrounding inflammatory changes. Spleen: Normal in size without focal abnormality. Adrenals/Urinary Tract: Normal appearance of the adrenal glands. Right kidney atrophy. Multiple left renal calculi are again identified. No hydronephrosis. Multiple left kidney lesions are again noted of varying density. These are incompletely characterized without IV contrast. Urinary bladder appears normal.  Stomach/Bowel: Stomach appears normal. The small bowel loops have a normal course and caliber without obstruction. The appendix is visualized and appears normal. Numerous distal colonic diverticula noted without acute inflammation. Vascular/Lymphatic: Aortic atherosclerosis. Infrarenal abdominal aortic aneurysm measures 3.6 cm, image 67/5. Stable. No abdominopelvic adenopathy identified. Reproductive: Prostate is unremarkable. Other: No abdominal wall hernia or abnormality. No abdominopelvic ascites. Musculoskeletal: No acute or significant osseous findings. IMPRESSION: 1. Continued decrease in size right middle/upper lobe lung mass. No specific findings identified to suggest new or progressive disease. 2. New tiny 3 mm nodule is identified in the left upper lobe, nonspecific. Attention to this nodule on follow-up imaging is recommended. 3. Aortic Atherosclerosis (ICD10-I70.0) and Emphysema (ICD10-J43.9). 4. Infrarenal abdominal aortic aneurysm. Recommend followup by ultrasound in 2 years. This recommendation follows ACR consensus guidelines: White Paper of the ACR Incidental Findings Committee II on Vascular Findings. J Am Coll Radiol 2013; 10:789-794. Aortic aneurysm NOS (ICD10-I71.9). Electronically Signed   By: Kerby Moors M.D.   On: 10/30/2018 14:58    ASSESSMENT AND PLAN: This is a very pleasant 78 years old white male recently diagnosed with extensive stage small cell lung cancer and currently undergoing systemic chemotherapy with carboplatin, etoposide and Tecentriq status post 5 cycles. He is also status post 4 cycle of maintenance treatment with single agent Tecentriq.   The patient is tolerating this treatment well with no concerning adverse effects. He had repeat CT scan of the chest, abdomen and pelvis performed recently.  I personally and independently reviewed the scans and discussed the results with the patient today. His scan showed no concerning findings for disease progression. I  recommended for the patient to continue his current treatment with maintenance Tecentriq 1200 mg IV every 3 weeks.  He will proceed with cycle #5 today. I will see him back for follow-up visit in 3 weeks for evaluation before the next cycle of his treatment. The patient was advised to call immediately if he has any concerning symptoms in the interval. The patient voices understanding of current disease status and treatment options and is in agreement with the current care plan.  All questions were answered. The patient knows to call the clinic with any problems, questions or concerns. We can certainly see the patient much sooner if necessary.  Disclaimer: This note was dictated with voice recognition software. Similar sounding words can inadvertently be transcribed and may not be corrected upon review.

## 2018-11-03 NOTE — Telephone Encounter (Signed)
Scheduled appt per 1/6 los - pt to get an updated schedule next visit .

## 2018-11-03 NOTE — Patient Instructions (Signed)
[facility] Discharge Instructions for Patients Receiving Chemotherapy  Today you received the following chemotherapy agents:  Tecentriq  To help prevent nausea and vomiting after your treatment, we encourage you to take your nausea medication as prescribed.   If you develop nausea and vomiting that is not controlled by your nausea medication, call the clinic.   BELOW ARE SYMPTOMS THAT SHOULD BE REPORTED IMMEDIATELY:  *FEVER GREATER THAN 100.5 F  *CHILLS WITH OR WITHOUT FEVER  NAUSEA AND VOMITING THAT IS NOT CONTROLLED WITH YOUR NAUSEA MEDICATION  *UNUSUAL SHORTNESS OF BREATH  *UNUSUAL BRUISING OR BLEEDING  TENDERNESS IN MOUTH AND THROAT WITH OR WITHOUT PRESENCE OF ULCERS  *URINARY PROBLEMS  *BOWEL PROBLEMS  UNUSUAL RASH Items with * indicate a potential emergency and should be followed up as soon as possible.  Feel free to call the clinic should you have any questions or concerns. The clinic phone number is [phone].  Please show the [place] at check-in to the Emergency Department and triage nurse.

## 2018-11-24 ENCOUNTER — Inpatient Hospital Stay (HOSPITAL_BASED_OUTPATIENT_CLINIC_OR_DEPARTMENT_OTHER): Payer: Medicare HMO | Admitting: Internal Medicine

## 2018-11-24 ENCOUNTER — Encounter: Payer: Self-pay | Admitting: Internal Medicine

## 2018-11-24 ENCOUNTER — Other Ambulatory Visit: Payer: Self-pay

## 2018-11-24 ENCOUNTER — Inpatient Hospital Stay: Payer: Medicare HMO

## 2018-11-24 VITALS — BP 109/68 | HR 68 | Temp 97.5°F | Resp 18 | Ht 70.0 in | Wt 153.9 lb

## 2018-11-24 DIAGNOSIS — R5382 Chronic fatigue, unspecified: Secondary | ICD-10-CM

## 2018-11-24 DIAGNOSIS — Z5112 Encounter for antineoplastic immunotherapy: Secondary | ICD-10-CM

## 2018-11-24 DIAGNOSIS — C3411 Malignant neoplasm of upper lobe, right bronchus or lung: Secondary | ICD-10-CM

## 2018-11-24 DIAGNOSIS — C342 Malignant neoplasm of middle lobe, bronchus or lung: Secondary | ICD-10-CM | POA: Diagnosis not present

## 2018-11-24 DIAGNOSIS — R911 Solitary pulmonary nodule: Secondary | ICD-10-CM | POA: Diagnosis not present

## 2018-11-24 DIAGNOSIS — C3490 Malignant neoplasm of unspecified part of unspecified bronchus or lung: Secondary | ICD-10-CM

## 2018-11-24 DIAGNOSIS — Z79899 Other long term (current) drug therapy: Secondary | ICD-10-CM | POA: Diagnosis not present

## 2018-11-24 LAB — CBC WITH DIFFERENTIAL (CANCER CENTER ONLY)
Abs Immature Granulocytes: 0.03 10*3/uL (ref 0.00–0.07)
Basophils Absolute: 0 10*3/uL (ref 0.0–0.1)
Basophils Relative: 1 %
Eosinophils Absolute: 0.2 10*3/uL (ref 0.0–0.5)
Eosinophils Relative: 2 %
HCT: 42 % (ref 39.0–52.0)
Hemoglobin: 12.8 g/dL — ABNORMAL LOW (ref 13.0–17.0)
Immature Granulocytes: 1 %
Lymphocytes Relative: 14 %
Lymphs Abs: 0.9 10*3/uL (ref 0.7–4.0)
MCH: 29.6 pg (ref 26.0–34.0)
MCHC: 30.5 g/dL (ref 30.0–36.0)
MCV: 97.2 fL (ref 80.0–100.0)
Monocytes Absolute: 0.4 10*3/uL (ref 0.1–1.0)
Monocytes Relative: 6 %
Neutro Abs: 5.1 10*3/uL (ref 1.7–7.7)
Neutrophils Relative %: 76 %
Platelet Count: 275 10*3/uL (ref 150–400)
RBC: 4.32 MIL/uL (ref 4.22–5.81)
RDW: 13.2 % (ref 11.5–15.5)
WBC Count: 6.6 10*3/uL (ref 4.0–10.5)
nRBC: 0 % (ref 0.0–0.2)

## 2018-11-24 LAB — CMP (CANCER CENTER ONLY)
ALT: 6 U/L (ref 0–44)
AST: 11 U/L — AB (ref 15–41)
Albumin: 3 g/dL — ABNORMAL LOW (ref 3.5–5.0)
Alkaline Phosphatase: 90 U/L (ref 38–126)
Anion gap: 8 (ref 5–15)
BUN: 28 mg/dL — AB (ref 8–23)
CO2: 24 mmol/L (ref 22–32)
Calcium: 10.3 mg/dL (ref 8.9–10.3)
Chloride: 105 mmol/L (ref 98–111)
Creatinine: 2.23 mg/dL — ABNORMAL HIGH (ref 0.61–1.24)
GFR, Est AFR Am: 32 mL/min — ABNORMAL LOW (ref >60)
GFR, Estimated: 27 mL/min — ABNORMAL LOW (ref >60)
Glucose, Bld: 101 mg/dL — ABNORMAL HIGH (ref 70–99)
POTASSIUM: 5.1 mmol/L (ref 3.5–5.1)
Sodium: 137 mmol/L (ref 135–145)
Total Bilirubin: 0.4 mg/dL (ref 0.3–1.2)
Total Protein: 7.4 g/dL (ref 6.5–8.1)

## 2018-11-24 LAB — TSH: TSH: 1.037 u[IU]/mL (ref 0.320–4.118)

## 2018-11-24 MED ORDER — SODIUM CHLORIDE 0.9 % IV SOLN
1200.0000 mg | Freq: Once | INTRAVENOUS | Status: AC
  Administered 2018-11-24: 1200 mg via INTRAVENOUS
  Filled 2018-11-24: qty 20

## 2018-11-24 MED ORDER — SODIUM CHLORIDE 0.9 % IV SOLN
Freq: Once | INTRAVENOUS | Status: AC
  Administered 2018-11-24: 11:00:00 via INTRAVENOUS
  Filled 2018-11-24: qty 250

## 2018-11-24 NOTE — Patient Instructions (Signed)
[facility] Discharge Instructions for Patients Receiving Chemotherapy  Today you received the following chemotherapy agents: Atezolizumab Gildardo Pounds)  To help prevent nausea and vomiting after your treatment, we encourage you to take your nausea medication as directed.    If you develop nausea and vomiting that is not controlled by your nausea medication, call the clinic.   BELOW ARE SYMPTOMS THAT SHOULD BE REPORTED IMMEDIATELY:  *FEVER GREATER THAN 100.5 F  *CHILLS WITH OR WITHOUT FEVER  NAUSEA AND VOMITING THAT IS NOT CONTROLLED WITH YOUR NAUSEA MEDICATION  *UNUSUAL SHORTNESS OF BREATH  *UNUSUAL BRUISING OR BLEEDING  TENDERNESS IN MOUTH AND THROAT WITH OR WITHOUT PRESENCE OF ULCERS  *URINARY PROBLEMS  *BOWEL PROBLEMS  UNUSUAL RASH Items with * indicate a potential emergency and should be followed up as soon as possible.  Feel free to call the clinic should you have any questions or concerns. The clinic phone number is [phone].  Please show the [place] at check-in to the Emergency Department and triage nurse.

## 2018-11-24 NOTE — Progress Notes (Signed)
[place] [phone]) [phone]   [phone]  OFFICE PROGRESS NOTE  Patient, No Pcp Per No address on file  DIAGNOSIS: Extensive stage (T4, N2, M1a) small cell lung cancer presented with large right perihilar mass with mediastinal invasion and loculated malignant right pleural effusion diagnosed in June 2019.  PRIOR THERAPY:None.  CURRENT THERAPY: Systemic chemotherapy with carboplatin for AUC of 5 on day 1, etoposide 100 mg/M2 on days 1, 2 and 3 in addition to Tecentriq 1200 mg IV every 3 weeks with Neulasta support.  Status post 10 cycles.  Starting from cycle #6 the patient will be treated with maintenance single agent Tecentriq every 3 weeks  INTERVAL HISTORY: Christopher Wilson 78 y.o. male returns to the clinic today for follow-up visit.  The patient is feeling fine today with no concerning complaints except for mild cough and postnasal drainage.  He denied having any chest pain, shortness of breath or hemoptysis.  He denied having any fever or chills.  He has no nausea, vomiting, diarrhea or constipation.  He denied having any headache or visual changes.  He is here today for evaluation before starting cycle #11.   MEDICAL HISTORY: Past Medical History:  Diagnosis Date  . ACS (acute coronary syndrome), with ST elevation but stable coronary arteries on cath 06/27/12 06/27/2012  . CAD (coronary artery disease), with CABG 1989 and re-do Cabg 2002 X 3 vesssels.  progressive disease with Stent to Lt. Main, and 1st diag 2006, stent to VG to LCX 2007, with known occlusion history o  06/27/2012  . CKD (chronic kidney disease) stage 3, GFR 30-59 ml/min (HCC) 06/27/2012  . 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 Coronary artery disease   . GERD (gastroesophageal reflux disease)   . Hyperlipidemia LDL goal < 70 06/27/2012  . Hypertension   . Myocardial infarction ([place])   . Peptic ulcer   . STEMI (ST elevation myocardial infarction) ([place]) 06/27/2012  . Tobacco abuse  06/27/2012    ALLERGIES:  has No Known Allergies.  MEDICATIONS:  Current Outpatient Medications  Medication Sig Dispense Refill  . acetaminophen (TYLENOL) 325 MG tablet Take 650 mg by mouth every 6 (six) hours as needed for moderate pain.    Marland Kitchen albuterol (PROVENTIL HFA;VENTOLIN HFA) 108 (90 Base) MCG/ACT inhaler Inhale 2 puffs into the lungs every 6 (six) hours as needed for wheezing or shortness of breath.    Marland Kitchen atorvastatin (LIPITOR) 80 MG tablet TAKE ONE TABLET BY MOUTH DAILY AT 6 P.M. NEEDS APPOINTMENT FOR FUTURE REFILLS. (Patient taking differently: Take 80 mg by mouth daily at 6 PM. ) 90 tablet 3  . metoprolol tartrate (LOPRESSOR) 25 MG tablet Take 1 tablet (25 mg total) by mouth 2 (two) times daily. 60 tablet 3  . nitroGLYCERIN (NITROSTAT) 0.4 MG SL tablet Place 1 tablet (0.4 mg total) under the tongue every 5 (five) minutes as needed for chest pain. 25 tablet 3  . prochlorperazine (COMPAZINE) 10 MG tablet Take 1 tablet (10 mg total) by mouth every 6 (six) hours as needed for nausea or vomiting. 30 tablet 0  . rivaroxaban (XARELTO) 20 MG TABS tablet Take 1 tablet (20 mg total) by mouth daily with supper. 30 tablet 1  . isosorbide mononitrate (IMDUR) 30 MG 24 hr tablet Take 1 tablet (30 mg total) by mouth daily. 90 tablet 3   No current facility-administered medications for this visit.     SURGICAL HISTORY:  Past Surgical History:  Procedure Laterality Date  .  coratid arterty surgery    . CORONARY ANGIOPLASTY WITH STENT PLACEMENT    . CORONARY ARTERY BYPASS GRAFT    . IR GUIDED DRAIN W CATHETER PLACEMENT  04/08/2018  . IR REMOVAL OF PLURAL CATH W/CUFF  06/19/2018  . LCEA    . LEFT HEART CATH N/A 06/27/2012   Procedure: LEFT HEART CATH;  Surgeon: Troy Sine, MD;  Location: [facility] CATH LAB;  Service: Cardiovascular;  Laterality: N/A;  . triple bipass     Hx of CABG and RE do  . VASCULAR SURGERY    . VIDEO BRONCHOSCOPY Bilateral 04/07/2018   Procedure: VIDEO BRONCHOSCOPY WITHOUT  FLUORO;  Surgeon: Rush Farmer, MD;  Location: [facility] ENDOSCOPY;  Service: Cardiopulmonary;  Laterality: Bilateral;    REVIEW OF SYSTEMS:  A comprehensive review of systems was negative except for: Respiratory: positive for cough   PHYSICAL EXAMINATION: General appearance: alert, cooperative and no distress Head: Normocephalic, without obvious abnormality, atraumatic Neck: no adenopathy, no JVD, supple, symmetrical, trachea midline and thyroid not enlarged, symmetric, no tenderness/mass/nodules Lymph nodes: Cervical, supraclavicular, and axillary nodes normal. Resp: clear to auscultation bilaterally Back: symmetric, no curvature. ROM normal. No CVA tenderness. Cardio: regular rate and rhythm, S1, S2 normal, no murmur, click, rub or gallop GI: soft, non-tender; bowel sounds normal; no masses,  no organomegaly Extremities: extremities normal, atraumatic, no cyanosis or edema  ECOG PERFORMANCE STATUS: 1 - Symptomatic but completely ambulatory  Blood pressure 109/68, pulse 68, temperature (!) 97.5 F (36.4 C), temperature source Oral, resp. rate 18, height 5\' 10"  (1.778 m), weight 153 lb 14.4 oz (69.8 kg), SpO2 95 %.  LABORATORY DATA: Lab Results  Component Value Date   WBC 6.6 11/24/2018   HGB 12.8 (L) 11/24/2018   HCT 42.0 11/24/2018   MCV 97.2 11/24/2018   PLT 275 11/24/2018      Chemistry      Component Value Date/Time   NA 143 11/03/2018 1057   NA 140 03/21/2018 1048   K 4.6 11/03/2018 1057   CL 110 11/03/2018 1057   CO2 25 11/03/2018 1057   BUN 24 (H) 11/03/2018 1057   BUN 45 (H) 03/21/2018 1048   CREATININE 2.10 (H) 11/03/2018 1057   CREATININE 1.80 (H) 09/06/2014 1033      Component Value Date/Time   CALCIUM 9.8 11/03/2018 1057   ALKPHOS 84 11/03/2018 1057   AST 13 (L) 11/03/2018 1057   ALT 8 11/03/2018 1057   BILITOT 0.4 11/03/2018 1057       RADIOGRAPHIC STUDIES: Ct Abdomen Pelvis Wo Contrast  Result Date: 10/30/2018 CLINICAL DATA:  Extensive stage small  cell lung cancer. Follow-up exam. EXAM: CT CHEST, ABDOMEN AND PELVIS WITHOUT CONTRAST TECHNIQUE: Multidetector CT imaging of the chest, abdomen and pelvis was performed following the standard protocol without IV contrast. COMPARISON:  08/29/2018 FINDINGS: CT CHEST FINDINGS Cardiovascular: Normal heart size. Aortic atherosclerosis. Previous median sternotomy and CABG procedure. No pericardial effusion. Mediastinum/Nodes: Normal appearance of the thyroid gland. The trachea appears patent and is midline. Right paratracheal lymph node is stable measuring 7 mm. No mediastinal or hilar adenopathy identified. No axillary or supraclavicular adenopathy. Lungs/Pleura: No pleural effusion. Moderate changes of emphysema. The partially calcified spiculated nodule in the right lung apex measures 1.3 cm, image 29/4. Previously 1.5 cm. The large perifissural mass involving the right upper lobe and right middle lobe is again identified. On today's study this measures 2.7 by 3.4 cm, image 33/2. Previously 2.9 x 3.8 cm. Nodule within the posterior right upper  lobe measures 4 mm, image 69/4. Previously 5 mm. New tiny nodule within the left upper lobe is noted measuring 3 mm. Musculoskeletal: Spondylosis identified within the thoracic spine. No aggressive lytic or sclerotic bone lesions. CT ABDOMEN PELVIS FINDINGS Hepatobiliary: Low-density structure in left lobe of liver is unchanged measuring 9 mm. No suspicious liver abnormalities. Gallbladder normal. No biliary ductal dilatation. Pancreas: Unremarkable. No pancreatic ductal dilatation or surrounding inflammatory changes. Spleen: Normal in size without focal abnormality. Adrenals/Urinary Tract: Normal appearance of the adrenal glands. Right kidney atrophy. Multiple left renal calculi are again identified. No hydronephrosis. Multiple left kidney lesions are again noted of varying density. These are incompletely characterized without IV contrast. Urinary bladder appears normal.  Stomach/Bowel: Stomach appears normal. The small bowel loops have a normal course and caliber without obstruction. The appendix is visualized and appears normal. Numerous distal colonic diverticula noted without acute inflammation. Vascular/Lymphatic: Aortic atherosclerosis. Infrarenal abdominal aortic aneurysm measures 3.6 cm, image 67/5. Stable. No abdominopelvic adenopathy identified. Reproductive: Prostate is unremarkable. Other: No abdominal wall hernia or abnormality. No abdominopelvic ascites. Musculoskeletal: No acute or significant osseous findings. IMPRESSION: 1. Continued decrease in size right middle/upper lobe lung mass. No specific findings identified to suggest new or progressive disease. 2. New tiny 3 mm nodule is identified in the left upper lobe, nonspecific. Attention to this nodule on follow-up imaging is recommended. 3. Aortic Atherosclerosis (ICD10-I70.0) and Emphysema (ICD10-J43.9). 4. Infrarenal abdominal aortic aneurysm. Recommend followup by ultrasound in 2 years. This recommendation follows ACR consensus guidelines: White Paper of the ACR Incidental Findings Committee II on Vascular Findings. J Am Coll Radiol 2013; 10:789-794. Aortic aneurysm NOS (ICD10-I71.9). Electronically Signed   By: Kerby Moors M.D.   On: 10/30/2018 14:58   Ct Chest Wo Contrast  Result Date: 10/30/2018 CLINICAL DATA:  Extensive stage small cell lung cancer. Follow-up exam. EXAM: CT CHEST, ABDOMEN AND PELVIS WITHOUT CONTRAST TECHNIQUE: Multidetector CT imaging of the chest, abdomen and pelvis was performed following the standard protocol without IV contrast. COMPARISON:  08/29/2018 FINDINGS: CT CHEST FINDINGS Cardiovascular: Normal heart size. Aortic atherosclerosis. Previous median sternotomy and CABG procedure. No pericardial effusion. Mediastinum/Nodes: Normal appearance of the thyroid gland. The trachea appears patent and is midline. Right paratracheal lymph node is stable measuring 7 mm. No mediastinal or  hilar adenopathy identified. No axillary or supraclavicular adenopathy. Lungs/Pleura: No pleural effusion. Moderate changes of emphysema. The partially calcified spiculated nodule in the right lung apex measures 1.3 cm, image 29/4. Previously 1.5 cm. The large perifissural mass involving the right upper lobe and right middle lobe is again identified. On today's study this measures 2.7 by 3.4 cm, image 33/2. Previously 2.9 x 3.8 cm. Nodule within the posterior right upper lobe measures 4 mm, image 69/4. Previously 5 mm. New tiny nodule within the left upper lobe is noted measuring 3 mm. Musculoskeletal: Spondylosis identified within the thoracic spine. No aggressive lytic or sclerotic bone lesions. CT ABDOMEN PELVIS FINDINGS Hepatobiliary: Low-density structure in left lobe of liver is unchanged measuring 9 mm. No suspicious liver abnormalities. Gallbladder normal. No biliary ductal dilatation. Pancreas: Unremarkable. No pancreatic ductal dilatation or surrounding inflammatory changes. Spleen: Normal in size without focal abnormality. Adrenals/Urinary Tract: Normal appearance of the adrenal glands. Right kidney atrophy. Multiple left renal calculi are again identified. No hydronephrosis. Multiple left kidney lesions are again noted of varying density. These are incompletely characterized without IV contrast. Urinary bladder appears normal. Stomach/Bowel: Stomach appears normal. The small bowel loops have a normal course and caliber  without obstruction. The appendix is visualized and appears normal. Numerous distal colonic diverticula noted without acute inflammation. Vascular/Lymphatic: Aortic atherosclerosis. Infrarenal abdominal aortic aneurysm measures 3.6 cm, image 67/5. Stable. No abdominopelvic adenopathy identified. Reproductive: Prostate is unremarkable. Other: No abdominal wall hernia or abnormality. No abdominopelvic ascites. Musculoskeletal: No acute or significant osseous findings. IMPRESSION: 1.  Continued decrease in size right middle/upper lobe lung mass. No specific findings identified to suggest new or progressive disease. 2. New tiny 3 mm nodule is identified in the left upper lobe, nonspecific. Attention to this nodule on follow-up imaging is recommended. 3. Aortic Atherosclerosis (ICD10-I70.0) and Emphysema (ICD10-J43.9). 4. Infrarenal abdominal aortic aneurysm. Recommend followup by ultrasound in 2 years. This recommendation follows ACR consensus guidelines: White Paper of the ACR Incidental Findings Committee II on Vascular Findings. J Am Coll Radiol 2013; 10:789-794. Aortic aneurysm NOS (ICD10-I71.9). Electronically Signed   By: Kerby Moors M.D.   On: 10/30/2018 14:58    ASSESSMENT AND PLAN: This is a very pleasant 78 years old white male recently diagnosed with extensive stage small cell lung cancer and currently undergoing systemic chemotherapy with carboplatin, etoposide and Tecentriq status post 5 cycles. He is also status post 5 cycle of maintenance treatment with single agent Tecentriq.   The patient has been tolerating this treatment well with no concerning adverse effects. I recommended for him to proceed with cycle #6 of his maintenance treatment with Tecentriq today. I will see the patient back for follow-up visit in 3 weeks for evaluation before starting cycle #7. The patient was advised to call immediately if he has any concerning symptoms in the interval. The patient voices understanding of current disease status and treatment options and is in agreement with the current care plan.  All questions were answered. The patient knows to call the clinic with any problems, questions or concerns. We can certainly see the patient much sooner if necessary.  Disclaimer: This note was dictated with voice recognition software. Similar sounding words can inadvertently be transcribed and may not be corrected upon review.

## 2018-11-24 NOTE — Progress Notes (Signed)
Per Dr. Julien Nordmann: OK to treat with Creatinine of 2.23

## 2018-12-15 ENCOUNTER — Inpatient Hospital Stay: Payer: Medicare HMO

## 2018-12-15 ENCOUNTER — Inpatient Hospital Stay: Payer: Medicare HMO | Attending: Internal Medicine | Admitting: Internal Medicine

## 2018-12-15 ENCOUNTER — Encounter: Payer: Self-pay | Admitting: Internal Medicine

## 2018-12-15 ENCOUNTER — Telehealth: Payer: Self-pay | Admitting: Internal Medicine

## 2018-12-15 VITALS — BP 126/77 | HR 75 | Temp 98.1°F | Resp 18 | Ht 70.0 in | Wt 158.2 lb

## 2018-12-15 DIAGNOSIS — C3411 Malignant neoplasm of upper lobe, right bronchus or lung: Secondary | ICD-10-CM

## 2018-12-15 DIAGNOSIS — C3491 Malignant neoplasm of unspecified part of right bronchus or lung: Secondary | ICD-10-CM | POA: Diagnosis not present

## 2018-12-15 DIAGNOSIS — Z7901 Long term (current) use of anticoagulants: Secondary | ICD-10-CM

## 2018-12-15 DIAGNOSIS — Z87891 Personal history of nicotine dependence: Secondary | ICD-10-CM

## 2018-12-15 DIAGNOSIS — I10 Essential (primary) hypertension: Secondary | ICD-10-CM | POA: Diagnosis not present

## 2018-12-15 DIAGNOSIS — Z79899 Other long term (current) drug therapy: Secondary | ICD-10-CM

## 2018-12-15 DIAGNOSIS — Z5112 Encounter for antineoplastic immunotherapy: Secondary | ICD-10-CM | POA: Diagnosis not present

## 2018-12-15 DIAGNOSIS — J910 Malignant pleural effusion: Secondary | ICD-10-CM

## 2018-12-15 DIAGNOSIS — I252 Old myocardial infarction: Secondary | ICD-10-CM | POA: Diagnosis not present

## 2018-12-15 LAB — CBC WITH DIFFERENTIAL (CANCER CENTER ONLY)
Abs Immature Granulocytes: 0.03 10*3/uL (ref 0.00–0.07)
Basophils Absolute: 0.1 10*3/uL (ref 0.0–0.1)
Basophils Relative: 1 %
Eosinophils Absolute: 0.2 10*3/uL (ref 0.0–0.5)
Eosinophils Relative: 4 %
HCT: 41.7 % (ref 39.0–52.0)
Hemoglobin: 12.7 g/dL — ABNORMAL LOW (ref 13.0–17.0)
Immature Granulocytes: 1 %
Lymphocytes Relative: 19 %
Lymphs Abs: 1 10*3/uL (ref 0.7–4.0)
MCH: 29.6 pg (ref 26.0–34.0)
MCHC: 30.5 g/dL (ref 30.0–36.0)
MCV: 97.2 fL (ref 80.0–100.0)
MONOS PCT: 9 %
Monocytes Absolute: 0.5 10*3/uL (ref 0.1–1.0)
NEUTROS PCT: 66 %
Neutro Abs: 3.5 10*3/uL (ref 1.7–7.7)
Platelet Count: 178 10*3/uL (ref 150–400)
RBC: 4.29 MIL/uL (ref 4.22–5.81)
RDW: 14.6 % (ref 11.5–15.5)
WBC Count: 5.3 10*3/uL (ref 4.0–10.5)
nRBC: 0 % (ref 0.0–0.2)

## 2018-12-15 LAB — CMP (CANCER CENTER ONLY)
ALBUMIN: 3.2 g/dL — AB (ref 3.5–5.0)
ALT: 7 U/L (ref 0–44)
AST: 17 U/L (ref 15–41)
Alkaline Phosphatase: 79 U/L (ref 38–126)
Anion gap: 9 (ref 5–15)
BUN: 24 mg/dL — ABNORMAL HIGH (ref 8–23)
CO2: 21 mmol/L — ABNORMAL LOW (ref 22–32)
Calcium: 9.7 mg/dL (ref 8.9–10.3)
Chloride: 108 mmol/L (ref 98–111)
Creatinine: 2.04 mg/dL — ABNORMAL HIGH (ref 0.61–1.24)
GFR, Est AFR Am: 35 mL/min — ABNORMAL LOW (ref >60)
GFR, Estimated: 31 mL/min — ABNORMAL LOW (ref >60)
Glucose, Bld: 85 mg/dL (ref 70–99)
POTASSIUM: 4.9 mmol/L (ref 3.5–5.1)
Sodium: 138 mmol/L (ref 135–145)
Total Bilirubin: 0.4 mg/dL (ref 0.3–1.2)
Total Protein: 7 g/dL (ref 6.5–8.1)

## 2018-12-15 MED ORDER — SODIUM CHLORIDE 0.9 % IV SOLN
1200.0000 mg | Freq: Once | INTRAVENOUS | Status: AC
  Administered 2018-12-15: 1200 mg via INTRAVENOUS
  Filled 2018-12-15: qty 20

## 2018-12-15 MED ORDER — SODIUM CHLORIDE 0.9 % IV SOLN
Freq: Once | INTRAVENOUS | Status: AC
  Administered 2018-12-15: 11:00:00 via INTRAVENOUS
  Filled 2018-12-15: qty 250

## 2018-12-15 NOTE — Progress Notes (Signed)
OK to treat with today's creatinine of 2.04

## 2018-12-15 NOTE — Progress Notes (Signed)
[place] [phone]) [phone]   Fax:(336) [phone]  OFFICE PROGRESS NOTE  Patient, No Pcp Per No address on file  DIAGNOSIS: Extensive stage (T4, N2, M1a) small cell lung cancer presented with large right perihilar mass with mediastinal invasion and loculated malignant right pleural effusion diagnosed in June 2019.  PRIOR THERAPY:None.  CURRENT THERAPY: Systemic chemotherapy with carboplatin for AUC of 5 on day 1, etoposide 100 mg/M2 on days 1, 2 and 3 in addition to Tecentriq 1200 mg IV every 3 weeks with Neulasta support.  Status post 11 cycles.  Starting from cycle #6 the patient will be treated with maintenance single agent Tecentriq every 3 weeks  INTERVAL HISTORY: Christopher Wilson 78 y.o. male returns to the clinic today for follow-up visit.  The patient is feeling fine today with no concerning complaints.  He denied having any chest pain, shortness of breath, cough or hemoptysis.  He denied having any fever or chills.  He has no nausea, vomiting, diarrhea or constipation.  He has no headache or visual changes.  He is here today for evaluation before starting cycle #12.   MEDICAL HISTORY: Past Medical History:  Diagnosis Date  . ACS (acute coronary syndrome), with ST elevation but stable coronary arteries on cath 06/27/12 06/27/2012  . CAD (coronary artery disease), with CABG 1989 and re-do Cabg 2002 X 3 vesssels.  progressive disease with Stent to Lt. Main, and 1st diag 2006, stent to VG to LCX 2007, with known occlusion history o  06/27/2012  . CKD (chronic kidney disease) stage 3, GFR 30-59 ml/min (HCC) 06/27/2012  . 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 Coronary artery disease   . GERD (gastroesophageal reflux disease)   . Hyperlipidemia LDL goal < 70 06/27/2012  . Hypertension   . Myocardial infarction ([place])   . Peptic ulcer   . STEMI (ST elevation myocardial infarction) ([place]) 06/27/2012  . Tobacco abuse 06/27/2012    ALLERGIES:  has No Known  Allergies.  MEDICATIONS:  Current Outpatient Medications  Medication Sig Dispense Refill  . acetaminophen (TYLENOL) 325 MG tablet Take 650 mg by mouth every 6 (six) hours as needed for moderate pain.    Marland Kitchen albuterol (PROVENTIL HFA;VENTOLIN HFA) 108 (90 Base) MCG/ACT inhaler Inhale 2 puffs into the lungs every 6 (six) hours as needed for wheezing or shortness of breath.    Marland Kitchen atorvastatin (LIPITOR) 80 MG tablet TAKE ONE TABLET BY MOUTH DAILY AT 6 P.M. NEEDS APPOINTMENT FOR FUTURE REFILLS. (Patient taking differently: Take 80 mg by mouth daily at 6 PM. ) 90 tablet 3  . metoprolol tartrate (LOPRESSOR) 25 MG tablet Take 1 tablet (25 mg total) by mouth 2 (two) times daily. 60 tablet 3  . rivaroxaban (XARELTO) 20 MG TABS tablet Take 1 tablet (20 mg total) by mouth daily with supper. 30 tablet 1  . isosorbide mononitrate (IMDUR) 30 MG 24 hr tablet Take 1 tablet (30 mg total) by mouth daily. 90 tablet 3  . nitroGLYCERIN (NITROSTAT) 0.4 MG SL tablet Place 1 tablet (0.4 mg total) under the tongue every 5 (five) minutes as needed for chest pain. (Patient not taking: Reported on 12/15/2018) 25 tablet 3  . prochlorperazine (COMPAZINE) 10 MG tablet Take 1 tablet (10 mg total) by mouth every 6 (six) hours as needed for nausea or vomiting. (Patient not taking: Reported on 12/15/2018) 30 tablet 0   No current facility-administered medications for this visit.     SURGICAL HISTORY:  Past Surgical History:  Procedure Laterality Date  . coratid arterty surgery    . CORONARY ANGIOPLASTY WITH STENT PLACEMENT    . CORONARY ARTERY BYPASS GRAFT    . IR GUIDED DRAIN W CATHETER PLACEMENT  04/08/2018  . IR REMOVAL OF PLURAL CATH W/CUFF  06/19/2018  . LCEA    . LEFT HEART CATH N/A 06/27/2012   Procedure: LEFT HEART CATH;  Surgeon: Troy Sine, MD;  Location: [facility] CATH LAB;  Service: Cardiovascular;  Laterality: N/A;  . triple bipass     Hx of CABG and RE do  . VASCULAR SURGERY    . VIDEO BRONCHOSCOPY Bilateral 04/07/2018    Procedure: VIDEO BRONCHOSCOPY WITHOUT FLUORO;  Surgeon: Rush Farmer, MD;  Location: [facility] ENDOSCOPY;  Service: Cardiopulmonary;  Laterality: Bilateral;    REVIEW OF SYSTEMS:  A comprehensive review of systems was negative.   PHYSICAL EXAMINATION: General appearance: alert, cooperative and no distress Head: Normocephalic, without obvious abnormality, atraumatic Neck: no adenopathy, no JVD, supple, symmetrical, trachea midline and thyroid not enlarged, symmetric, no tenderness/mass/nodules Lymph nodes: Cervical, supraclavicular, and axillary nodes normal. Resp: clear to auscultation bilaterally Back: symmetric, no curvature. ROM normal. No CVA tenderness. Cardio: regular rate and rhythm, S1, S2 normal, no murmur, click, rub or gallop GI: soft, non-tender; bowel sounds normal; no masses,  no organomegaly Extremities: extremities normal, atraumatic, no cyanosis or edema  ECOG PERFORMANCE STATUS: 1 - Symptomatic but completely ambulatory  Blood pressure 126/77, pulse 75, temperature 98.1 F (36.7 C), temperature source Oral, resp. rate 18, height 5\' 10"  (1.778 m), weight 158 lb 3.2 oz (71.8 kg), SpO2 100 %.  LABORATORY DATA: Lab Results  Component Value Date   WBC 5.3 12/15/2018   HGB 12.7 (L) 12/15/2018   HCT 41.7 12/15/2018   MCV 97.2 12/15/2018   PLT 178 12/15/2018      Chemistry      Component Value Date/Time   NA 138 12/15/2018 0900   NA 140 03/21/2018 1048   K 4.9 12/15/2018 0900   CL 108 12/15/2018 0900   CO2 21 (L) 12/15/2018 0900   BUN 24 (H) 12/15/2018 0900   BUN 45 (H) 03/21/2018 1048   CREATININE 2.04 (H) 12/15/2018 0900   CREATININE 1.80 (H) 09/06/2014 1033      Component Value Date/Time   CALCIUM 9.7 12/15/2018 0900   ALKPHOS 79 12/15/2018 0900   AST 17 12/15/2018 0900   ALT 7 12/15/2018 0900   BILITOT 0.4 12/15/2018 0900       RADIOGRAPHIC STUDIES: No results found.  ASSESSMENT AND PLAN: This is a very pleasant 78 years old white male recently  diagnosed with extensive stage small cell lung cancer and currently undergoing systemic chemotherapy with carboplatin, etoposide and Tecentriq status post 5 cycles. He is also status post 6 cycle of maintenance treatment with single agent Tecentriq.   The patient is tolerating his maintenance treatment well with no concerning adverse effects. I recommended for him to proceed with cycle #7 today as scheduled. I will see him back for follow-up visit in 3 weeks for evaluation after repeating CT scan of the chest, abdomen and pelvis for restaging of his disease. The patient was advised to call immediately if he has any concerning symptoms in the interval. The patient voices understanding of current disease status and treatment options and is in agreement with the current care plan.  All questions were answered. The patient knows to call the clinic with any problems, questions or concerns. We can certainly see the patient  much sooner if necessary.  Disclaimer: This note was dictated with voice recognition software. Similar sounding words can inadvertently be transcribed and may not be corrected upon review.

## 2018-12-15 NOTE — Telephone Encounter (Signed)
Scheduled appt per 2/17 los - pt to get an updated. schedule next visit.

## 2018-12-15 NOTE — Patient Instructions (Signed)
[facility] Discharge Instructions for Patients Receiving Chemotherapy  Today you received the following chemotherapy agents: Atezolizumab Gildardo Pounds)  To help prevent nausea and vomiting after your treatment, we encourage you to take your nausea medication as directed.    If you develop nausea and vomiting that is not controlled by your nausea medication, call the clinic.   BELOW ARE SYMPTOMS THAT SHOULD BE REPORTED IMMEDIATELY:  *FEVER GREATER THAN 100.5 F  *CHILLS WITH OR WITHOUT FEVER  NAUSEA AND VOMITING THAT IS NOT CONTROLLED WITH YOUR NAUSEA MEDICATION  *UNUSUAL SHORTNESS OF BREATH  *UNUSUAL BRUISING OR BLEEDING  TENDERNESS IN MOUTH AND THROAT WITH OR WITHOUT PRESENCE OF ULCERS  *URINARY PROBLEMS  *BOWEL PROBLEMS  UNUSUAL RASH Items with * indicate a potential emergency and should be followed up as soon as possible.  Feel free to call the clinic should you have any questions or concerns. The clinic phone number is [phone].  Please show the [place] at check-in to the Emergency Department and triage nurse.

## 2019-01-01 ENCOUNTER — Ambulatory Visit (HOSPITAL_COMMUNITY)
Admission: RE | Admit: 2019-01-01 | Discharge: 2019-01-01 | Disposition: A | Discharge status: Home / Self Care | Payer: Medicare HMO | Source: Ambulatory Visit | Attending: Internal Medicine | Admitting: Internal Medicine

## 2019-01-01 ENCOUNTER — Other Ambulatory Visit: Payer: Self-pay | Admitting: Medical Oncology

## 2019-01-01 ENCOUNTER — Encounter (HOSPITAL_COMMUNITY): Payer: Self-pay

## 2019-01-01 DIAGNOSIS — C3411 Malignant neoplasm of upper lobe, right bronchus or lung: Secondary | ICD-10-CM | POA: Insufficient documentation

## 2019-01-01 DIAGNOSIS — C3490 Malignant neoplasm of unspecified part of unspecified bronchus or lung: Secondary | ICD-10-CM | POA: Diagnosis not present

## 2019-01-01 DIAGNOSIS — Z5111 Encounter for antineoplastic chemotherapy: Secondary | ICD-10-CM | POA: Diagnosis not present

## 2019-01-05 ENCOUNTER — Other Ambulatory Visit: Payer: Self-pay

## 2019-01-05 ENCOUNTER — Inpatient Hospital Stay (HOSPITAL_BASED_OUTPATIENT_CLINIC_OR_DEPARTMENT_OTHER): Payer: Medicare HMO | Admitting: Internal Medicine

## 2019-01-05 ENCOUNTER — Inpatient Hospital Stay: Payer: Medicare HMO

## 2019-01-05 ENCOUNTER — Inpatient Hospital Stay: Payer: Medicare HMO | Attending: Internal Medicine

## 2019-01-05 ENCOUNTER — Encounter: Payer: Self-pay | Admitting: Internal Medicine

## 2019-01-05 VITALS — BP 117/73 | HR 69 | Temp 97.5°F | Resp 18 | Ht 70.0 in | Wt 158.2 lb

## 2019-01-05 DIAGNOSIS — C342 Malignant neoplasm of middle lobe, bronchus or lung: Secondary | ICD-10-CM | POA: Insufficient documentation

## 2019-01-05 DIAGNOSIS — I10 Essential (primary) hypertension: Secondary | ICD-10-CM

## 2019-01-05 DIAGNOSIS — Z5112 Encounter for antineoplastic immunotherapy: Secondary | ICD-10-CM | POA: Diagnosis not present

## 2019-01-05 DIAGNOSIS — N289 Disorder of kidney and ureter, unspecified: Secondary | ICD-10-CM | POA: Diagnosis not present

## 2019-01-05 DIAGNOSIS — J910 Malignant pleural effusion: Secondary | ICD-10-CM

## 2019-01-05 DIAGNOSIS — C3411 Malignant neoplasm of upper lobe, right bronchus or lung: Secondary | ICD-10-CM

## 2019-01-05 DIAGNOSIS — Z79899 Other long term (current) drug therapy: Secondary | ICD-10-CM | POA: Diagnosis not present

## 2019-01-05 DIAGNOSIS — Z720 Tobacco use: Secondary | ICD-10-CM

## 2019-01-05 LAB — CBC WITH DIFFERENTIAL (CANCER CENTER ONLY)
Abs Immature Granulocytes: 0.02 10*3/uL (ref 0.00–0.07)
Basophils Absolute: 0 10*3/uL (ref 0.0–0.1)
Basophils Relative: 1 %
Eosinophils Absolute: 0.2 10*3/uL (ref 0.0–0.5)
Eosinophils Relative: 4 %
HCT: 39.8 % (ref 39.0–52.0)
HEMOGLOBIN: 12.5 g/dL — AB (ref 13.0–17.0)
Immature Granulocytes: 0 %
LYMPHS PCT: 21 %
Lymphs Abs: 1 10*3/uL (ref 0.7–4.0)
MCH: 30 pg (ref 26.0–34.0)
MCHC: 31.4 g/dL (ref 30.0–36.0)
MCV: 95.7 fL (ref 80.0–100.0)
Monocytes Absolute: 0.5 10*3/uL (ref 0.1–1.0)
Monocytes Relative: 10 %
Neutro Abs: 2.8 10*3/uL (ref 1.7–7.7)
Neutrophils Relative %: 64 %
Platelet Count: 198 10*3/uL (ref 150–400)
RBC: 4.16 MIL/uL — ABNORMAL LOW (ref 4.22–5.81)
RDW: 15.8 % — ABNORMAL HIGH (ref 11.5–15.5)
WBC Count: 4.5 10*3/uL (ref 4.0–10.5)
nRBC: 0 % (ref 0.0–0.2)

## 2019-01-05 LAB — COMPREHENSIVE METABOLIC PANEL
ALK PHOS: 80 U/L (ref 38–126)
ALT: 11 U/L (ref 0–44)
AST: 17 U/L (ref 15–41)
Albumin: 3.8 g/dL (ref 3.5–5.0)
Anion gap: 8 (ref 5–15)
BUN: 27 mg/dL — ABNORMAL HIGH (ref 8–23)
CALCIUM: 9.5 mg/dL (ref 8.9–10.3)
CO2: 25 mmol/L (ref 22–32)
Chloride: 105 mmol/L (ref 98–111)
Creatinine, Ser: 2.23 mg/dL — ABNORMAL HIGH (ref 0.61–1.24)
GFR calc Af Amer: 32 mL/min — ABNORMAL LOW (ref >60)
GFR calc non Af Amer: 27 mL/min — ABNORMAL LOW (ref >60)
GLUCOSE: 95 mg/dL (ref 70–99)
Potassium: 5.5 mmol/L — ABNORMAL HIGH (ref 3.5–5.1)
Sodium: 138 mmol/L (ref 135–145)
Total Bilirubin: 0.8 mg/dL (ref 0.3–1.2)
Total Protein: 7.1 g/dL (ref 6.5–8.1)

## 2019-01-05 MED ORDER — SODIUM CHLORIDE 0.9 % IV SOLN
1200.0000 mg | Freq: Once | INTRAVENOUS | Status: AC
  Administered 2019-01-05: 1200 mg via INTRAVENOUS
  Filled 2019-01-05: qty 20

## 2019-01-05 MED ORDER — SODIUM CHLORIDE 0.9 % IV SOLN
Freq: Once | INTRAVENOUS | Status: AC
  Administered 2019-01-05: 11:00:00 via INTRAVENOUS
  Filled 2019-01-05: qty 250

## 2019-01-05 NOTE — Patient Instructions (Signed)
[facility] Discharge Instructions for Patients Receiving Chemotherapy  Today you received the following chemotherapy agents: Tecentriq  To help prevent nausea and vomiting after your treatment, we encourage you to take your nausea medication as directed.   If you develop nausea and vomiting that is not controlled by your nausea medication, call the clinic.   BELOW ARE SYMPTOMS THAT SHOULD BE REPORTED IMMEDIATELY:  *FEVER GREATER THAN 100.5 F  *CHILLS WITH OR WITHOUT FEVER  NAUSEA AND VOMITING THAT IS NOT CONTROLLED WITH YOUR NAUSEA MEDICATION  *UNUSUAL SHORTNESS OF BREATH  *UNUSUAL BRUISING OR BLEEDING  TENDERNESS IN MOUTH AND THROAT WITH OR WITHOUT PRESENCE OF ULCERS  *URINARY PROBLEMS  *BOWEL PROBLEMS  UNUSUAL RASH Items with * indicate a potential emergency and should be followed up as soon as possible.  Feel free to call the clinic should you have any questions or concerns. The clinic phone number is [phone].  Please show the CHEMO ALERT CARD at check-in to the Emergency Department and triage nurse.   

## 2019-01-05 NOTE — Progress Notes (Signed)
OK to treat- Per Dr Julien Nordmann it is okay to treat pt today with Tecentriq and todays Creatinine.

## 2019-01-05 NOTE — Progress Notes (Signed)
[place] [phone]   [phone]  OFFICE PROGRESS NOTE  Patient, No Pcp Per No address on file  DIAGNOSIS: Extensive stage (T4, N2, M1a) small cell lung cancer presented with large right perihilar mass with mediastinal invasion and loculated malignant right pleural effusion diagnosed in June 2019.  PRIOR THERAPY:None.  CURRENT THERAPY: Systemic chemotherapy with carboplatin for AUC of 5 on day 1, etoposide 100 mg/M2 on days 1, 2 and 3 in addition to Tecentriq 1200 mg IV every 3 weeks with Neulasta support.  Status post 12 cycles.  Starting from cycle #6 the patient will be treated with maintenance single agent Tecentriq every 3 weeks  INTERVAL HISTORY: Christopher Wilson 78 y.o. male returns to the clinic today for follow-up visit.  The patient is feeling fine today with no concerning complaints.  He continues to tolerate his treatment with Tecentriq fairly well.  He denied having any chest pain but has shortness of breath with exertion with no cough or hemoptysis.  He has no nausea, vomiting, diarrhea or constipation.  He denied having any skin rash or itching.  The patient had repeat CT scan of the chest, abdomen and pelvis performed recently and he is here for evaluation and discussion of his scan results.  MEDICAL HISTORY: Past Medical History:  Diagnosis Date  . ACS (acute coronary syndrome), with ST elevation but stable coronary arteries on cath 06/27/12 06/27/2012  . CAD (coronary artery disease), with CABG 1989 and re-do Cabg 2002 X 3 vesssels.  progressive disease with Stent to Lt. Main, and 1st diag 2006, stent to VG to LCX 2007, with known occlusion history o  06/27/2012  . CKD (chronic kidney disease) stage 3, GFR 30-59 ml/min (HCC) 06/27/2012  . 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 Coronary artery disease   . GERD (gastroesophageal reflux disease)   . Hyperlipidemia LDL goal < 70 06/27/2012  . Hypertension   . Myocardial infarction ([place])     . Peptic ulcer   . STEMI (ST elevation myocardial infarction) ([place]) 06/27/2012  . Tobacco abuse 06/27/2012    ALLERGIES:  has No Known Allergies.  MEDICATIONS:  Current Outpatient Medications  Medication Sig Dispense Refill  . acetaminophen (TYLENOL) 325 MG tablet Take 650 mg by mouth every 6 (six) hours as needed for moderate pain.    Marland Kitchen atorvastatin (LIPITOR) 80 MG tablet TAKE ONE TABLET BY MOUTH DAILY AT 6 P.M. NEEDS APPOINTMENT FOR FUTURE REFILLS. (Patient taking differently: Take 80 mg by mouth daily at 6 PM. ) 90 tablet 3  . metoprolol tartrate (LOPRESSOR) 25 MG tablet Take 1 tablet (25 mg total) by mouth 2 (two) times daily. 60 tablet 3  . rivaroxaban (XARELTO) 20 MG TABS tablet Take 1 tablet (20 mg total) by mouth daily with supper. 30 tablet 1  . albuterol (PROVENTIL HFA;VENTOLIN HFA) 108 (90 Base) MCG/ACT inhaler Inhale 2 puffs into the lungs every 6 (six) hours as needed for wheezing or shortness of breath.    . isosorbide mononitrate (IMDUR) 30 MG 24 hr tablet Take 1 tablet (30 mg total) by mouth daily. 90 tablet 3  . nitroGLYCERIN (NITROSTAT) 0.4 MG SL tablet Place 1 tablet (0.4 mg total) under the tongue every 5 (five) minutes as needed for chest pain. (Patient not taking: Reported on 12/15/2018) 25 tablet 3  . prochlorperazine (COMPAZINE) 10 MG tablet Take 1 tablet (10 mg total) by mouth every 6 (six) hours as needed for nausea or vomiting. (Patient not  taking: Reported on 12/15/2018) 30 tablet 0   No current facility-administered medications for this visit.     SURGICAL HISTORY:  Past Surgical History:  Procedure Laterality Date  . coratid arterty surgery    . CORONARY ANGIOPLASTY WITH STENT PLACEMENT    . CORONARY ARTERY BYPASS GRAFT    . IR GUIDED DRAIN W CATHETER PLACEMENT  04/08/2018  . IR REMOVAL OF PLURAL CATH W/CUFF  06/19/2018  . LCEA    . LEFT HEART CATH N/A 06/27/2012   Procedure: LEFT HEART CATH;  Surgeon: Troy Sine, MD;  Location: [facility] CATH LAB;  Service:  Cardiovascular;  Laterality: N/A;  . triple bipass     Hx of CABG and RE do  . VASCULAR SURGERY    . VIDEO BRONCHOSCOPY Bilateral 04/07/2018   Procedure: VIDEO BRONCHOSCOPY WITHOUT FLUORO;  Surgeon: Rush Farmer, MD;  Location: [facility] ENDOSCOPY;  Service: Cardiopulmonary;  Laterality: Bilateral;    REVIEW OF SYSTEMS:  Constitutional: positive for fatigue Eyes: negative Ears, nose, mouth, throat, and face: negative Respiratory: positive for dyspnea on exertion Cardiovascular: negative Gastrointestinal: negative Genitourinary:negative Integument/breast: negative Hematologic/lymphatic: negative Musculoskeletal:negative Neurological: negative Behavioral/Psych: negative Endocrine: negative Allergic/Immunologic: negative   PHYSICAL EXAMINATION: General appearance: alert, cooperative, fatigued and no distress Head: Normocephalic, without obvious abnormality, atraumatic Neck: no adenopathy, no JVD, supple, symmetrical, trachea midline and thyroid not enlarged, symmetric, no tenderness/mass/nodules Lymph nodes: Cervical, supraclavicular, and axillary nodes normal. Resp: clear to auscultation bilaterally Back: symmetric, no curvature. ROM normal. No CVA tenderness. Cardio: regular rate and rhythm, S1, S2 normal, no murmur, click, rub or gallop GI: soft, non-tender; bowel sounds normal; no masses,  no organomegaly Extremities: extremities normal, atraumatic, no cyanosis or edema Neurologic: Alert and oriented X 3, normal strength and tone. Normal symmetric reflexes. Normal coordination and gait  ECOG PERFORMANCE STATUS: 1 - Symptomatic but completely ambulatory  Blood pressure 117/73, pulse 69, temperature (!) 97.5 F (36.4 C), temperature source Oral, resp. rate 18, height 5\' 10"  (1.778 m), weight 158 lb 3.2 oz (71.8 kg), SpO2 99 %.  LABORATORY DATA: Lab Results  Component Value Date   WBC 4.5 01/05/2019   HGB 12.5 (L) 01/05/2019   HCT 39.8 01/05/2019   MCV 95.7 01/05/2019   PLT 198  01/05/2019      Chemistry      Component Value Date/Time   NA 138 01/05/2019 0943   NA 140 03/21/2018 1048   K 5.5 (H) 01/05/2019 0943   CL 105 01/05/2019 0943   CO2 25 01/05/2019 0943   BUN 27 (H) 01/05/2019 0943   BUN 45 (H) 03/21/2018 1048   CREATININE 2.23 (H) 01/05/2019 0943   CREATININE 2.04 (H) 12/15/2018 0900   CREATININE 1.80 (H) 09/06/2014 1033      Component Value Date/Time   CALCIUM 9.5 01/05/2019 0943   ALKPHOS 80 01/05/2019 0943   AST 17 01/05/2019 0943   AST 17 12/15/2018 0900   ALT 11 01/05/2019 0943   ALT 7 12/15/2018 0900   BILITOT 0.8 01/05/2019 0943   BILITOT 0.4 12/15/2018 0900       RADIOGRAPHIC STUDIES: Ct Abdomen Pelvis Wo Contrast  Result Date: 01/01/2019 CLINICAL DATA:  Lung cancer, chemotherapy in progress. EXAM: CT CHEST, ABDOMEN AND PELVIS WITHOUT CONTRAST TECHNIQUE: Multidetector CT imaging of the chest, abdomen and pelvis was performed following the standard protocol without IV contrast. COMPARISON:  10/30/2018. FINDINGS: CT CHEST FINDINGS Cardiovascular: Atherosclerotic calcification of the aorta. Heart size normal. No pericardial effusion. Mediastinum/Nodes: Mediastinal lymph nodes are  not enlarged by CT size criteria. Hilar regions are difficult to evaluate without IV contrast. No axillary adenopathy. Esophagus is grossly unremarkable. Lungs/Pleura: Spiculated nodule in the apical segment right upper lobe measures 13 mm (series 4, image 24), unchanged. Centrilobular and paraseptal emphysema. Treated mass in the inferior right upper lobe is stable in size, measuring approximately 3.2 x 3.8 cm (76). Peribronchovascular nodularity and consolidation in the posteromedial right lower lobe, with associated endobronchial debris, new from 10/30/2018. Previously seen 3 mm left upper lobe nodule has resolved. Vague areas of nodular ground-glass in the left upper lobe (31 and 38) are new from 10/30/2018. No pleural fluid. Debris is seen dependently in the left  mainstem bronchus. Airway is otherwise unremarkable. Musculoskeletal: Degenerative changes in the spine. No worrisome lytic or sclerotic lesions. CT ABDOMEN PELVIS FINDINGS Hepatobiliary: Low-attenuation lesions in the liver measure up to 9 mm in the dome, as before. Liver and gallbladder are otherwise unremarkable. No biliary ductal dilatation. Pancreas: Negative. Spleen: Negative. Adrenals/Urinary Tract: Adrenal glands are unremarkable. Right kidney is atrophic and contains a peripherally calcified 12 mm lesion in the lower pole, as before, too small to characterize. There are stones in the left kidney. Subcentimeter high density lesions in the left kidney are too small to characterize. 2.8 cm low-density lesion off the interpolar left kidney is likely a cyst. Ureters are decompressed. Bladder is grossly unremarkable. Stomach/Bowel: Stomach, small bowel, appendix and colon are unremarkable. Vascular/Lymphatic: Atherosclerotic calcification of the arterial vasculature. Infrarenal aorta measures up to 3.6 cm (coronal series 5, image 67). No pathologically enlarged lymph nodes. Reproductive: Prostate is visualized. Other: No free fluid.  Mesenteries and peritoneum are unremarkable. Musculoskeletal: Degenerative changes in the spine. No worrisome lytic or sclerotic lesions. L1 compression deformity is unchanged. IMPRESSION: 1. Treated right upper lobe mass and apical segment right upper lobe nodule are stable. No evidence of distant metastatic disease. 2. New peribronchovascular nodularity and consolidation in the right lower lobe, most likely due to an infectious bronchiolitis. 3. Previously seen 3 mm left upper lobe nodule has resolved. 4. Vague nodular densities in the left upper lobe are new and possibly infectious or inflammatory in etiology. Continued attention on follow-up exams is warranted. 5. Left renal stones. 6. 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 Recommend followup by ultrasound in 2 years. This  recommendation follows ACR consensus guidelines: White Paper of the ACR Incidental Findings Committee II on Vascular Findings. J Am Coll Radiol 2013; 10:789-794. 7.  Aortic atherosclerosis (ICD10-170.0). 8.  Emphysema (ICD10-J43.9). Electronically Signed   By: Lorin Picket M.D.   On: 01/01/2019 14:03   Ct Chest Wo Contrast  Result Date: 01/01/2019 CLINICAL DATA:  Lung cancer, chemotherapy in progress. EXAM: CT CHEST, ABDOMEN AND PELVIS WITHOUT CONTRAST TECHNIQUE: Multidetector CT imaging of the chest, abdomen and pelvis was performed following the standard protocol without IV contrast. COMPARISON:  10/30/2018. FINDINGS: CT CHEST FINDINGS Cardiovascular: Atherosclerotic calcification of the aorta. Heart size normal. No pericardial effusion. Mediastinum/Nodes: Mediastinal lymph nodes are not enlarged by CT size criteria. Hilar regions are difficult to evaluate without IV contrast. No axillary adenopathy. Esophagus is grossly unremarkable. Lungs/Pleura: Spiculated nodule in the apical segment right upper lobe measures 13 mm (series 4, image 24), unchanged. Centrilobular and paraseptal emphysema. Treated mass in the inferior right upper lobe is stable in size, measuring approximately 3.2 x 3.8 cm (76). Peribronchovascular nodularity and consolidation in the posteromedial right lower lobe, with associated endobronchial debris, new from 10/30/2018. Previously seen 3 mm left upper lobe nodule  has resolved. Vague areas of nodular ground-glass in the left upper lobe (31 and 38) are new from 10/30/2018. No pleural fluid. Debris is seen dependently in the left mainstem bronchus. Airway is otherwise unremarkable. Musculoskeletal: Degenerative changes in the spine. No worrisome lytic or sclerotic lesions. CT ABDOMEN PELVIS FINDINGS Hepatobiliary: Low-attenuation lesions in the liver measure up to 9 mm in the dome, as before. Liver and gallbladder are otherwise unremarkable. No biliary ductal dilatation. Pancreas: Negative.  Spleen: Negative. Adrenals/Urinary Tract: Adrenal glands are unremarkable. Right kidney is atrophic and contains a peripherally calcified 12 mm lesion in the lower pole, as before, too small to characterize. There are stones in the left kidney. Subcentimeter high density lesions in the left kidney are too small to characterize. 2.8 cm low-density lesion off the interpolar left kidney is likely a cyst. Ureters are decompressed. Bladder is grossly unremarkable. Stomach/Bowel: Stomach, small bowel, appendix and colon are unremarkable. Vascular/Lymphatic: Atherosclerotic calcification of the arterial vasculature. Infrarenal aorta measures up to 3.6 cm (coronal series 5, image 67). No pathologically enlarged lymph nodes. Reproductive: Prostate is visualized. Other: No free fluid.  Mesenteries and peritoneum are unremarkable. Musculoskeletal: Degenerative changes in the spine. No worrisome lytic or sclerotic lesions. L1 compression deformity is unchanged. IMPRESSION: 1. Treated right upper lobe mass and apical segment right upper lobe nodule are stable. No evidence of distant metastatic disease. 2. New peribronchovascular nodularity and consolidation in the right lower lobe, most likely due to an infectious bronchiolitis. 3. Previously seen 3 mm left upper lobe nodule has resolved. 4. Vague nodular densities in the left upper lobe are new and possibly infectious or inflammatory in etiology. Continued attention on follow-up exams is warranted. 5. Left renal stones. 6. 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 Recommend followup by ultrasound in 2 years. This recommendation follows ACR consensus guidelines: White Paper of the ACR Incidental Findings Committee II on Vascular Findings. J Am Coll Radiol 2013; 10:789-794. 7.  Aortic atherosclerosis (ICD10-170.0). 8.  Emphysema (ICD10-J43.9). Electronically Signed   By: Lorin Picket M.D.   On: 01/01/2019 14:03    ASSESSMENT AND PLAN: This is a very pleasant 78 years  old white male recently diagnosed with extensive stage small cell lung cancer and currently undergoing systemic chemotherapy with carboplatin, etoposide and Tecentriq status post 5 cycles. He is also status post 7 cycle of maintenance treatment with single agent Tecentriq.   The patient has been tolerating this treatment well with no concerning adverse effects. He had repeat CT scan of the chest, abdomen and pelvis performed recently.  I personally and independently reviewed the scans and discussed the results with the patient today. His scan showed no concerning findings for disease progression. I recommended for the patient to proceed with cycle #8 of his maintenance therapy today as scheduled. I will see him back for follow-up visit in 3 weeks for evaluation before the next cycle of his treatment. For the renal insufficiency, I am strongly encouraged the patient to increase his oral intake.  We will continue to monitor it closely. The patient was advised to call immediately if he has any concerning symptoms in the interval. The patient voices understanding of current disease status and treatment options and is in agreement with the current care plan.  All questions were answered. The patient knows to call the clinic with any problems, questions or concerns. We can certainly see the patient much sooner if necessary.  Disclaimer: This note was dictated with voice recognition software. Similar sounding words can inadvertently  be transcribed and may not be corrected upon review.

## 2019-01-23 ENCOUNTER — Other Ambulatory Visit: Payer: Self-pay | Admitting: Physician Assistant

## 2019-01-23 DIAGNOSIS — C3411 Malignant neoplasm of upper lobe, right bronchus or lung: Secondary | ICD-10-CM

## 2019-01-26 ENCOUNTER — Encounter: Payer: Self-pay | Admitting: Internal Medicine

## 2019-01-26 ENCOUNTER — Telehealth: Payer: Self-pay | Admitting: Physician Assistant

## 2019-01-26 ENCOUNTER — Inpatient Hospital Stay (HOSPITAL_BASED_OUTPATIENT_CLINIC_OR_DEPARTMENT_OTHER): Payer: Medicare HMO | Admitting: Internal Medicine

## 2019-01-26 ENCOUNTER — Inpatient Hospital Stay: Payer: Medicare HMO

## 2019-01-26 ENCOUNTER — Other Ambulatory Visit: Payer: Self-pay | Admitting: Physician Assistant

## 2019-01-26 ENCOUNTER — Other Ambulatory Visit: Payer: Self-pay

## 2019-01-26 VITALS — BP 136/88 | HR 61 | Temp 97.5°F | Resp 18 | Ht 70.0 in | Wt 159.9 lb

## 2019-01-26 DIAGNOSIS — Z5112 Encounter for antineoplastic immunotherapy: Secondary | ICD-10-CM | POA: Diagnosis not present

## 2019-01-26 DIAGNOSIS — C342 Malignant neoplasm of middle lobe, bronchus or lung: Secondary | ICD-10-CM

## 2019-01-26 DIAGNOSIS — J910 Malignant pleural effusion: Secondary | ICD-10-CM

## 2019-01-26 DIAGNOSIS — C3411 Malignant neoplasm of upper lobe, right bronchus or lung: Secondary | ICD-10-CM

## 2019-01-26 DIAGNOSIS — I10 Essential (primary) hypertension: Secondary | ICD-10-CM

## 2019-01-26 DIAGNOSIS — R7989 Other specified abnormal findings of blood chemistry: Secondary | ICD-10-CM

## 2019-01-26 DIAGNOSIS — Z79899 Other long term (current) drug therapy: Secondary | ICD-10-CM

## 2019-01-26 LAB — CBC WITH DIFFERENTIAL (CANCER CENTER ONLY)
Abs Immature Granulocytes: 0.01 10*3/uL (ref 0.00–0.07)
Basophils Absolute: 0.1 10*3/uL (ref 0.0–0.1)
Basophils Relative: 1 %
Eosinophils Absolute: 0.1 10*3/uL (ref 0.0–0.5)
Eosinophils Relative: 3 %
HCT: 41.8 % (ref 39.0–52.0)
Hemoglobin: 13.2 g/dL (ref 13.0–17.0)
Immature Granulocytes: 0 %
Lymphocytes Relative: 27 %
Lymphs Abs: 1.2 10*3/uL (ref 0.7–4.0)
MCH: 30.6 pg (ref 26.0–34.0)
MCHC: 31.6 g/dL (ref 30.0–36.0)
MCV: 97 fL (ref 80.0–100.0)
MONOS PCT: 11 %
Monocytes Absolute: 0.5 10*3/uL (ref 0.1–1.0)
Neutro Abs: 2.5 10*3/uL (ref 1.7–7.7)
Neutrophils Relative %: 58 %
Platelet Count: 173 10*3/uL (ref 150–400)
RBC: 4.31 MIL/uL (ref 4.22–5.81)
RDW: 16.3 % — ABNORMAL HIGH (ref 11.5–15.5)
WBC Count: 4.4 10*3/uL (ref 4.0–10.5)
nRBC: 0 % (ref 0.0–0.2)

## 2019-01-26 LAB — CMP (CANCER CENTER ONLY)
ALT: 10 U/L (ref 0–44)
AST: 17 U/L (ref 15–41)
Albumin: 3.3 g/dL — ABNORMAL LOW (ref 3.5–5.0)
Alkaline Phosphatase: 73 U/L (ref 38–126)
Anion gap: 10 (ref 5–15)
BUN: 29 mg/dL — AB (ref 8–23)
CO2: 20 mmol/L — ABNORMAL LOW (ref 22–32)
Calcium: 9.1 mg/dL (ref 8.9–10.3)
Chloride: 108 mmol/L (ref 98–111)
Creatinine: 2.38 mg/dL — ABNORMAL HIGH (ref 0.61–1.24)
GFR, EST NON AFRICAN AMERICAN: 25 mL/min — AB (ref >60)
GFR, Est AFR Am: 29 mL/min — ABNORMAL LOW (ref >60)
Glucose, Bld: 90 mg/dL (ref 70–99)
Potassium: 4.8 mmol/L (ref 3.5–5.1)
Sodium: 138 mmol/L (ref 135–145)
Total Bilirubin: 0.5 mg/dL (ref 0.3–1.2)
Total Protein: 6.8 g/dL (ref 6.5–8.1)

## 2019-01-26 LAB — TSH: TSH: 49.277 u[IU]/mL — ABNORMAL HIGH (ref 0.320–4.118)

## 2019-01-26 MED ORDER — SODIUM CHLORIDE 0.9 % IV SOLN
1200.0000 mg | Freq: Once | INTRAVENOUS | Status: AC
  Administered 2019-01-26: 1200 mg via INTRAVENOUS
  Filled 2019-01-26: qty 20

## 2019-01-26 MED ORDER — SODIUM CHLORIDE 0.9 % IV SOLN
Freq: Once | INTRAVENOUS | Status: AC
  Administered 2019-01-26: 13:00:00 via INTRAVENOUS
  Filled 2019-01-26: qty 250

## 2019-01-26 MED ORDER — LEVOTHYROXINE SODIUM 50 MCG PO TABS: 50.0000 ug | ORAL_TABLET | Freq: Every day | ORAL | 1 refills | Status: DC

## 2019-01-26 NOTE — Progress Notes (Signed)
[place] [phone]   [phone]  OFFICE PROGRESS NOTE  Patient, No Pcp Per No address on file  DIAGNOSIS: Extensive stage (T4, N2, M1a) small cell lung cancer presented with large right perihilar mass with mediastinal invasion and loculated malignant right pleural effusion diagnosed in June 2019.  PRIOR THERAPY:None.  CURRENT THERAPY: Systemic chemotherapy with carboplatin for AUC of 5 on day 1, etoposide 100 mg/M2 on days 1, 2 and 3 in addition to Tecentriq 1200 mg IV every 3 weeks with Neulasta support.  Status post 13 cycles.  Starting from cycle #6 the patient will be treated with maintenance single agent Tecentriq every 3 weeks  INTERVAL HISTORY: Christopher Wilson 78 y.o. male returns to the clinic today for follow-up visit.  The patient is feeling fine today with no concerning complaints.  He denied having any chest pain, shortness of breath, cough or hemoptysis.  He denied having any fever or chills.  He has no nausea, vomiting, diarrhea or constipation.  He denied having any headache or visual changes.  He is here today for evaluation before starting cycle #14 of his treatment.  MEDICAL HISTORY: Past Medical History:  Diagnosis Date   ACS (acute coronary syndrome), with ST elevation but stable coronary arteries on cath 06/27/12 06/27/2012   CAD (coronary artery disease), with CABG 1989 and re-do Cabg 2002 X 3 vesssels.  progressive disease with Stent to Lt. 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  MEDICATIONS:  Current Outpatient Medications  Medication Sig Dispense Refill   acetaminophen (TYLENOL) 325 MG tablet Take 650 mg by mouth every 6 (six) hours as needed for moderate pain.     albuterol (PROVENTIL HFA;VENTOLIN HFA) 108 (90 Base) MCG/ACT inhaler Inhale 2 puffs into the lungs every 6 (six) hours as needed for wheezing or shortness of breath.     atorvastatin (LIPITOR) 80 MG tablet TAKE ONE TABLET BY MOUTH DAILY AT 6 P.M. NEEDS APPOINTMENT FOR FUTURE REFILLS. (Patient taking differently: Take 80 mg by mouth daily at 6 PM. ) 90 tablet 3   isosorbide mononitrate (IMDUR) 30 MG 24 hr tablet Take 1 tablet (30 mg total) by mouth daily. 90 tablet 3   metoprolol tartrate (LOPRESSOR) 25 MG tablet Take 1 tablet (25 mg total) by mouth 2 (two) times daily. 60 tablet 3   nitroGLYCERIN (NITROSTAT) 0.4 MG SL tablet Place 1 tablet (0.4 mg total) under the tongue every 5 (five) minutes as needed for chest pain. (Patient not taking: Reported on 12/15/2018) 25 tablet 3   prochlorperazine (COMPAZINE) 10 MG tablet Take 1 tablet (10 mg total) by mouth every 6 (six) hours as needed for nausea or vomiting. (Patient not taking: Reported on 12/15/2018) 30 tablet 0   rivaroxaban (XARELTO) 20 MG TABS tablet Take 1 tablet (20 mg total) by mouth daily with supper. 30 tablet 1   No current facility-administered medications for this visit.     SURGICAL HISTORY:  Past Surgical History:  Procedure Laterality Date   coratid arterty surgery     CORONARY ANGIOPLASTY WITH STENT PLACEMENT     CORONARY ARTERY BYPASS GRAFT     IR GUIDED DRAIN W CATHETER PLACEMENT  04/08/2018   IR REMOVAL OF PLURAL CATH W/CUFF  06/19/2018   LCEA     LEFT HEART CATH N/A 06/27/2012   Procedure: LEFT HEART CATH;  Surgeon: Troy Sine, MD;  Location[facility] CATH LAB;  Service: Cardiovascular;  Laterality: N/A;   triple bipass     Hx of CABG and RE do   VASCULAR SURGERY     VIDEO  BRONCHOSCOPY Bilateral 04/07/2018   Procedure: VIDEO BRONCHOSCOPY WITHOUT FLUORO;  Surgeon: Rush Farmer, MD;  Location: [facility] ENDOSCOPY;  Service: Cardiopulmonary;  Laterality: Bilateral;    REVIEW OF SYSTEMS:  A comprehensive review of systems was negative except for: Constitutional: positive for fatigue   PHYSICAL EXAMINATION: General appearance: alert, cooperative, fatigued and no distress Head: Normocephalic, without obvious abnormality, atraumatic Neck: no adenopathy, no JVD, supple, symmetrical, trachea midline and thyroid not enlarged, symmetric, no tenderness/mass/nodules Lymph nodes: Cervical, supraclavicular, and axillary nodes normal. Resp: clear to auscultation bilaterally Back: symmetric, no curvature. ROM normal. No CVA tenderness. Cardio: regular rate and rhythm, S1, S2 normal, no murmur, click, rub or gallop GI: soft, non-tender; bowel sounds normal; no masses,  no organomegaly Extremities: extremities normal, atraumatic, no cyanosis or edema  ECOG PERFORMANCE STATUS: 1 - Symptomatic but completely ambulatory  Blood pressure 136/88, pulse 61, temperature (!) 97.5 F (36.4 C), temperature source Oral, resp. rate 18, height 5\' 10"  (1.778 m), weight 159 lb 14.4 oz (72.5 kg), SpO2 100 %.  LABORATORY DATA: Lab Results  Component Value Date   WBC 4.4 01/26/2019   HGB 13.2 01/26/2019   HCT 41.8 01/26/2019   MCV 97.0 01/26/2019   PLT 173 01/26/2019      Chemistry      Component Value Date/Time   NA 138 01/26/2019 1048   NA 140 03/21/2018 1048   K 4.8 01/26/2019 1048   CL 108 01/26/2019 1048   CO2 20 (L) 01/26/2019 1048   BUN 29 (H) 01/26/2019 1048   BUN 45 (H) 03/21/2018 1048   CREATININE 2.38 (H) 01/26/2019 1048   CREATININE 1.80 (H) 09/06/2014 1033      Component Value Date/Time   CALCIUM 9.1 01/26/2019 1048   ALKPHOS 73 01/26/2019 1048   AST 17 01/26/2019 1048   ALT 10 01/26/2019 1048   BILITOT 0.5 01/26/2019 1048       RADIOGRAPHIC STUDIES: Ct  Abdomen Pelvis Wo Contrast  Result Date: 01/01/2019 CLINICAL DATA:  Lung cancer, chemotherapy in progress. EXAM: CT CHEST, ABDOMEN AND PELVIS WITHOUT CONTRAST TECHNIQUE: Multidetector CT imaging of the chest, abdomen and pelvis was performed following the standard protocol without IV contrast. COMPARISON:  10/30/2018. FINDINGS: CT CHEST FINDINGS Cardiovascular: Atherosclerotic calcification of the aorta. Heart size normal. No pericardial effusion. Mediastinum/Nodes: Mediastinal lymph nodes are not enlarged by CT size criteria. Hilar regions are difficult to evaluate without IV contrast. No axillary adenopathy. Esophagus is grossly unremarkable. Lungs/Pleura: Spiculated nodule in the apical segment right upper lobe measures 13 mm (series 4, image 24), unchanged. Centrilobular and paraseptal emphysema. Treated mass in the inferior right upper lobe is stable in size, measuring approximately 3.2 x 3.8 cm (76). Peribronchovascular nodularity and consolidation in the posteromedial right lower lobe, with associated endobronchial debris, new from 10/30/2018. Previously seen 3 mm left upper lobe nodule has resolved.  Vague areas of nodular ground-glass in the left upper lobe (31 and 38) are new from 10/30/2018. No pleural fluid. Debris is seen dependently in the left mainstem bronchus. Airway is otherwise unremarkable. Musculoskeletal: Degenerative changes in the spine. No worrisome lytic or sclerotic lesions. CT ABDOMEN PELVIS FINDINGS Hepatobiliary: Low-attenuation lesions in the liver measure up to 9 mm in the dome, as before. Liver and gallbladder are otherwise unremarkable. No biliary ductal dilatation. Pancreas: Negative. Spleen: Negative. Adrenals/Urinary Tract: Adrenal glands are unremarkable. Right kidney is atrophic and contains a peripherally calcified 12 mm lesion in the lower pole, as before, too small to characterize. There are stones in the left kidney. Subcentimeter high density lesions in the left kidney are  too small to characterize. 2.8 cm low-density lesion off the interpolar left kidney is likely a cyst. Ureters are decompressed. Bladder is grossly unremarkable. Stomach/Bowel: Stomach, small bowel, appendix and colon are unremarkable. Vascular/Lymphatic: Atherosclerotic calcification of the arterial vasculature. Infrarenal aorta measures up to 3.6 cm (coronal series 5, image 67). No pathologically enlarged lymph nodes. Reproductive: Prostate is visualized. Other: No free fluid.  Mesenteries and peritoneum are unremarkable. Musculoskeletal: Degenerative changes in the spine. No worrisome lytic or sclerotic lesions. L1 compression deformity is unchanged. IMPRESSION: 1. Treated right upper lobe mass and apical segment right upper lobe nodule are stable. No evidence of distant metastatic disease. 2. New peribronchovascular nodularity and consolidation in the right lower lobe, most likely due to an infectious bronchiolitis. 3. Previously seen 3 mm left upper lobe nodule has resolved. 4. Vague nodular densities in the left upper lobe are new and possibly infectious or inflammatory in etiology. Continued attention on follow-up exams is warranted. 5. Left renal stones. 6. 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 Recommend followup by ultrasound in 2 years. This recommendation follows ACR consensus guidelines: White Paper of the ACR Incidental Findings Committee II on Vascular Findings. J Am Coll Radiol 2013; 10:789-794. 7.  Aortic atherosclerosis (ICD10-170.0). 8.  Emphysema (ICD10-J43.9). Electronically Signed   By: Lorin Picket M.D.   On: 01/01/2019 14:03   Ct Chest Wo Contrast  Result Date: 01/01/2019 CLINICAL DATA:  Lung cancer, chemotherapy in progress. EXAM: CT CHEST, ABDOMEN AND PELVIS WITHOUT CONTRAST TECHNIQUE: Multidetector CT imaging of the chest, abdomen and pelvis was performed following the standard protocol without IV contrast. COMPARISON:  10/30/2018. FINDINGS: CT CHEST FINDINGS Cardiovascular:  Atherosclerotic calcification of the aorta. Heart size normal. No pericardial effusion. Mediastinum/Nodes: Mediastinal lymph nodes are not enlarged by CT size criteria. Hilar regions are difficult to evaluate without IV contrast. No axillary adenopathy. Esophagus is grossly unremarkable. Lungs/Pleura: Spiculated nodule in the apical segment right upper lobe measures 13 mm (series 4, image 24), unchanged. Centrilobular and paraseptal emphysema. Treated mass in the inferior right upper lobe is stable in size, measuring approximately 3.2 x 3.8 cm (76). Peribronchovascular nodularity and consolidation in the posteromedial right lower lobe, with associated endobronchial debris, new from 10/30/2018. Previously seen 3 mm left upper lobe nodule has resolved. Vague areas of nodular ground-glass in the left upper lobe (31 and 38) are new from 10/30/2018. No pleural fluid. Debris is seen dependently in the left mainstem bronchus. Airway is otherwise unremarkable. Musculoskeletal: Degenerative changes in the spine. No worrisome lytic or sclerotic lesions. CT ABDOMEN PELVIS FINDINGS Hepatobiliary: Low-attenuation lesions in the liver measure up to 9 mm in the dome, as before. Liver and gallbladder are otherwise unremarkable. No biliary ductal dilatation. Pancreas: Negative. Spleen: Negative. Adrenals/Urinary Tract: Adrenal glands are unremarkable. Right kidney is  atrophic and contains a peripherally calcified 12 mm lesion in the lower pole, as before, too small to characterize. There are stones in the left kidney. Subcentimeter high density lesions in the left kidney are too small to characterize. 2.8 cm low-density lesion off the interpolar left kidney is likely a cyst. Ureters are decompressed. Bladder is grossly unremarkable. Stomach/Bowel: Stomach, small bowel, appendix and colon are unremarkable. Vascular/Lymphatic: Atherosclerotic calcification of the arterial vasculature. Infrarenal aorta measures up to 3.6 cm (coronal  series 5, image 67). No pathologically enlarged lymph nodes. Reproductive: Prostate is visualized. Other: No free fluid.  Mesenteries and peritoneum are unremarkable. Musculoskeletal: Degenerative changes in the spine. No worrisome lytic or sclerotic lesions. L1 compression deformity is unchanged. IMPRESSION: 1. Treated right upper lobe mass and apical segment right upper lobe nodule are stable. No evidence of distant metastatic disease. 2. New peribronchovascular nodularity and consolidation in the right lower lobe, most likely due to an infectious bronchiolitis. 3. Previously seen 3 mm left upper lobe nodule has resolved. 4. Vague nodular densities in the left upper lobe are new and possibly infectious or inflammatory in etiology. Continued attention on follow-up exams is warranted. 5. Left renal stones. 6. 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 Recommend followup by ultrasound in 2 years. This recommendation follows ACR consensus guidelines: White Paper of the ACR Incidental Findings Committee II on Vascular Findings. J Am Coll Radiol 2013; 10:789-794. 7.  Aortic atherosclerosis (ICD10-170.0). 8.  Emphysema (ICD10-J43.9). Electronically Signed   By: Lorin Picket M.D.   On: 01/01/2019 14:03    ASSESSMENT AND PLAN: This is a very pleasant 78 years old white male recently diagnosed with extensive stage small cell lung cancer and currently undergoing systemic chemotherapy with carboplatin, etoposide and Tecentriq status post 5 cycles. He is also status post 8 cycle of maintenance treatment with single agent Tecentriq.   The patient continues to tolerate his treatment well with no concerning adverse effects. I recommended for him to proceed with cycle #9 today as scheduled. We will see him back for follow-up visit in 3 weeks for evaluation before starting cycle #10 of the maintenance treatment. The patient was advised to call immediately if he has any concerning symptoms in the interval. The patient  voices understanding of current disease status and treatment options and is in agreement with the current care plan.  All questions were answered. The patient knows to call the clinic with any problems, questions or concerns. We can certainly see the patient much sooner if necessary.  Disclaimer: This note was dictated with voice recognition software. Similar sounding words can inadvertently be transcribed and may not be corrected upon review.

## 2019-01-26 NOTE — Progress Notes (Signed)
OK to treat with labs and creatinine today per MD Julien Nordmann

## 2019-01-26 NOTE — Patient Instructions (Signed)
Coronavirus (COVID-19) Are you at risk?  Are you at risk for the Coronavirus (COVID-19)?  To be considered HIGH RISK for Coronavirus (COVID-19), you have to meet the following criteria:  . Traveled to [place], [place], [place], [place] or [place]; or in the [place] to [place], [place], [place], or [place]; and have fever, cough, and shortness of breath within the last 2 weeks of travel OR . Been in close contact with a person diagnosed with COVID-19 within the last 2 weeks and have fever, cough, and shortness of breath . IF YOU DO NOT MEET THESE CRITERIA, YOU ARE CONSIDERED LOW RISK FOR COVID-19.  What to do if you are HIGH RISK for COVID-19?  . If you are having a medical emergency, call 911. . Seek medical care right away. Before you go to a doctor's office, urgent care or emergency department, call ahead and tell them about your recent travel, contact with someone diagnosed with COVID-19, and your symptoms. You should receive instructions from your physician's office regarding next steps of care.  . When you arrive at healthcare provider, tell the healthcare staff immediately you have returned from visiting [place], [place], [place], [place] or [place]; or traveled in the [place] to [place], [place], [place], or [place]; in the last two weeks or you have been in close contact with a person diagnosed with COVID-19 in the last 2 weeks.   . Tell the health care staff about your symptoms: fever, cough and shortness of breath. . After you have been seen by a medical provider, you will be either: o Tested for (COVID-19) and discharged home on quarantine except to seek medical care if symptoms worsen, and asked to  - Stay home and avoid contact with others until you get your results (4-5 days)  - Avoid travel on public transportation if possible (such as bus, train, or airplane) or o Sent to the Emergency Department by EMS for evaluation, COVID-19 testing, and possible  admission depending on your condition and test results.  What to do if you are LOW RISK for COVID-19?  Reduce your risk of any infection by using the same precautions used for avoiding the common cold or flu:  . Wash your hands often with soap and warm water for at least 20 seconds.  If soap and water are not readily available, use an alcohol-based hand sanitizer with at least 60% alcohol.  . If coughing or sneezing, cover your mouth and nose by coughing or sneezing into the elbow areas of your shirt or coat, into a tissue or into your sleeve (not your hands). . Avoid shaking hands with others and consider head nods or verbal greetings only. . Avoid touching your eyes, nose, or mouth with unwashed hands.  . Avoid close contact with people who are sick. . Avoid places or events with large numbers of people in one location, like concerts or sporting events. . Carefully consider travel plans you have or are making. . If you are planning any travel outside or inside the [place], visit the CDC's Travelers' Health webpage for the latest health notices. . If you have some symptoms but not all symptoms, continue to monitor at home and seek medical attention if your symptoms worsen. . If you are having a medical emergency, call 911.   ADDITIONAL HEALTHCARE OPTIONS FOR PATIENTS  [place] Tele[facility] / e-Visit: [email or web address]         [facility]: [phone]  [place]   Urgent Care: [facility]: [place] Discharge Instructions for Patients Receiving Chemotherapy  Today you received the following chemotherapy agents Tecentriq  To help prevent nausea and vomiting after your treatment, we encourage you to take your nausea medication as directed.    If you develop nausea and vomiting that is not controlled by your nausea medication, call the clinic.   BELOW  ARE SYMPTOMS THAT SHOULD BE REPORTED IMMEDIATELY:  *FEVER GREATER THAN 100.5 F  *CHILLS WITH OR WITHOUT FEVER  NAUSEA AND VOMITING THAT IS NOT CONTROLLED WITH YOUR NAUSEA MEDICATION  *UNUSUAL SHORTNESS OF BREATH  *UNUSUAL BRUISING OR BLEEDING  TENDERNESS IN MOUTH AND THROAT WITH OR WITHOUT PRESENCE OF ULCERS  *URINARY PROBLEMS  *BOWEL PROBLEMS  UNUSUAL RASH Items with * indicate a potential emergency and should be followed up as soon as possible.  Feel free to call the clinic should you have any questions or concerns. The clinic phone number is [phone].  Please show the [place] at check-in to the Emergency Department and triage nurse.

## 2019-01-26 NOTE — Telephone Encounter (Signed)
Spoke to patient regarding his elevated TSH today. We will be starting him on 50 mcg p.o. daily of synthroid. We will continue to monitor his thyroid with routine labs. He expressed understanding. All questions answered.

## 2019-01-27 ENCOUNTER — Telehealth: Payer: Self-pay | Admitting: Internal Medicine

## 2019-01-27 NOTE — Telephone Encounter (Signed)
Tried to reach regarding 4/20 and so on

## 2019-02-16 ENCOUNTER — Encounter: Payer: Self-pay | Admitting: Internal Medicine

## 2019-02-16 ENCOUNTER — Inpatient Hospital Stay (HOSPITAL_BASED_OUTPATIENT_CLINIC_OR_DEPARTMENT_OTHER): Payer: Medicare HMO | Admitting: Internal Medicine

## 2019-02-16 ENCOUNTER — Inpatient Hospital Stay: Payer: Medicare HMO

## 2019-02-16 ENCOUNTER — Other Ambulatory Visit: Payer: Self-pay

## 2019-02-16 ENCOUNTER — Inpatient Hospital Stay: Payer: Medicare HMO | Attending: Internal Medicine

## 2019-02-16 VITALS — BP 123/78 | HR 65 | Temp 97.8°F | Resp 18 | Ht 70.0 in | Wt 159.4 lb

## 2019-02-16 DIAGNOSIS — C3411 Malignant neoplasm of upper lobe, right bronchus or lung: Secondary | ICD-10-CM

## 2019-02-16 DIAGNOSIS — I10 Essential (primary) hypertension: Secondary | ICD-10-CM | POA: Insufficient documentation

## 2019-02-16 DIAGNOSIS — E785 Hyperlipidemia, unspecified: Secondary | ICD-10-CM | POA: Diagnosis not present

## 2019-02-16 DIAGNOSIS — Z7901 Long term (current) use of anticoagulants: Secondary | ICD-10-CM | POA: Diagnosis not present

## 2019-02-16 DIAGNOSIS — Z79899 Other long term (current) drug therapy: Secondary | ICD-10-CM | POA: Diagnosis not present

## 2019-02-16 DIAGNOSIS — N183 Chronic kidney disease, stage 3 (moderate): Secondary | ICD-10-CM | POA: Insufficient documentation

## 2019-02-16 DIAGNOSIS — I252 Old myocardial infarction: Secondary | ICD-10-CM

## 2019-02-16 DIAGNOSIS — C342 Malignant neoplasm of middle lobe, bronchus or lung: Secondary | ICD-10-CM | POA: Insufficient documentation

## 2019-02-16 DIAGNOSIS — Z5112 Encounter for antineoplastic immunotherapy: Secondary | ICD-10-CM

## 2019-02-16 LAB — CBC WITH DIFFERENTIAL (CANCER CENTER ONLY)
Abs Immature Granulocytes: 0.02 10*3/uL (ref 0.00–0.07)
Basophils Absolute: 0.1 10*3/uL (ref 0.0–0.1)
Basophils Relative: 1 %
Eosinophils Absolute: 0.1 10*3/uL (ref 0.0–0.5)
Eosinophils Relative: 3 %
HCT: 42.7 % (ref 39.0–52.0)
Hemoglobin: 13.7 g/dL (ref 13.0–17.0)
Immature Granulocytes: 0 %
Lymphocytes Relative: 19 %
Lymphs Abs: 1 10*3/uL (ref 0.7–4.0)
MCH: 31.4 pg (ref 26.0–34.0)
MCHC: 32.1 g/dL (ref 30.0–36.0)
MCV: 97.9 fL (ref 80.0–100.0)
Monocytes Absolute: 0.4 10*3/uL (ref 0.1–1.0)
Monocytes Relative: 8 %
Neutro Abs: 3.6 10*3/uL (ref 1.7–7.7)
Neutrophils Relative %: 69 %
Platelet Count: 170 10*3/uL (ref 150–400)
RBC: 4.36 MIL/uL (ref 4.22–5.81)
RDW: 16.5 % — ABNORMAL HIGH (ref 11.5–15.5)
WBC Count: 5.2 10*3/uL (ref 4.0–10.5)
nRBC: 0 % (ref 0.0–0.2)

## 2019-02-16 LAB — CMP (CANCER CENTER ONLY)
ALT: 6 U/L (ref 0–44)
AST: 15 U/L (ref 15–41)
Albumin: 3.5 g/dL (ref 3.5–5.0)
Alkaline Phosphatase: 73 U/L (ref 38–126)
Anion gap: 9 (ref 5–15)
BUN: 31 mg/dL — ABNORMAL HIGH (ref 8–23)
CO2: 20 mmol/L — ABNORMAL LOW (ref 22–32)
Calcium: 9.3 mg/dL (ref 8.9–10.3)
Chloride: 109 mmol/L (ref 98–111)
Creatinine: 2.29 mg/dL — ABNORMAL HIGH (ref 0.61–1.24)
GFR, Est AFR Am: 31 mL/min — ABNORMAL LOW (ref >60)
GFR, Estimated: 27 mL/min — ABNORMAL LOW (ref >60)
Glucose, Bld: 89 mg/dL (ref 70–99)
Potassium: 4.7 mmol/L (ref 3.5–5.1)
Sodium: 138 mmol/L (ref 135–145)
Total Bilirubin: 0.5 mg/dL (ref 0.3–1.2)
Total Protein: 7.4 g/dL (ref 6.5–8.1)

## 2019-02-16 LAB — TSH: TSH: 70.62 u[IU]/mL — ABNORMAL HIGH (ref 0.320–4.118)

## 2019-02-16 MED ORDER — SODIUM CHLORIDE 0.9 % IV SOLN
1200.0000 mg | Freq: Once | INTRAVENOUS | Status: AC
  Administered 2019-02-16: 1200 mg via INTRAVENOUS
  Filled 2019-02-16: qty 20

## 2019-02-16 MED ORDER — SODIUM CHLORIDE 0.9 % IV SOLN
Freq: Once | INTRAVENOUS | Status: AC
  Administered 2019-02-16: 12:00:00 via INTRAVENOUS
  Filled 2019-02-16: qty 250

## 2019-02-16 NOTE — Patient Instructions (Signed)
[place] Discharge Instructions for Patients Receiving Chemotherapy  Today you received the following chemotherapy agents: Tecentriq.  To help prevent nausea and vomiting after your treatment, we encourage you to take your nausea medication as directed   If you develop nausea and vomiting that is not controlled by your nausea medication, call the clinic.   BELOW ARE SYMPTOMS THAT SHOULD BE REPORTED IMMEDIATELY:  *FEVER GREATER THAN 100.5 F  *CHILLS WITH OR WITHOUT FEVER  NAUSEA AND VOMITING THAT IS NOT CONTROLLED WITH YOUR NAUSEA MEDICATION  *UNUSUAL SHORTNESS OF BREATH  *UNUSUAL BRUISING OR BLEEDING  TENDERNESS IN MOUTH AND THROAT WITH OR WITHOUT PRESENCE OF ULCERS  *URINARY PROBLEMS  *BOWEL PROBLEMS  UNUSUAL RASH Items with * indicate a potential emergency and should be followed up as soon as possible.  Feel free to call the clinic should you have any questions or concerns. The clinic phone number is [phone].  Please show the [place] at check-in to the Emergency Department and triage nurse.  This information is directly available on the CDC website: [email or web address].co.za.html    Source:CDC Reference to specific commercial products, manufacturers, companies, or trademarks does not constitute its endorsement or recommendation by the [place], [place], or Centers for [employer] and Prevention.

## 2019-02-16 NOTE — Progress Notes (Signed)
[place] [phone]   [phone]  OFFICE PROGRESS NOTE  Patient, No Pcp Per No address on file  DIAGNOSIS: Extensive stage (T4, N2, M1a) small cell lung cancer presented with large right perihilar mass with mediastinal invasion and loculated malignant right pleural effusion diagnosed in June 2019.  PRIOR THERAPY:None.  CURRENT THERAPY: Systemic chemotherapy with carboplatin for AUC of 5 on day 1, etoposide 100 mg/M2 on days 1, 2 and 3 in addition to Tecentriq 1200 mg IV every 3 weeks with Neulasta support.  Status post 14 cycles.  Starting from cycle #6 the patient will be treated with maintenance single agent Tecentriq every 3 weeks  INTERVAL HISTORY: Christopher Wilson 78 y.o. male returns to the clinic today for follow-up visit.  The patient is feeling fine today with no concerning complaints except for the persistent cough that he has for several months.  He denied having any shortness of breath, chest pain or hemoptysis.  He denied having any recent weight loss or night sweats.  He has no nausea, vomiting, diarrhea or constipation.  He has no headache or visual changes.  He is here today for evaluation before starting cycle #15.   MEDICAL HISTORY: Past Medical History:  Diagnosis Date  . ACS (acute coronary syndrome), with ST elevation but stable coronary arteries on cath 06/27/12 06/27/2012  . CAD (coronary artery disease), with CABG 1989 and re-do Cabg 2002 X 3 vesssels.  progressive disease with Stent to Lt. Main, and 1st diag 2006, stent to VG to LCX 2007, with known occlusion history o  06/27/2012  . CKD (chronic kidney disease) stage 3, GFR 30-59 ml/min (HCC) 06/27/2012  . 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 Coronary artery disease   . GERD (gastroesophageal reflux disease)   . Hyperlipidemia LDL goal < 70 06/27/2012  . Hypertension   . Myocardial infarction ([place])   . Peptic ulcer   . STEMI (ST elevation myocardial infarction) ([place])  06/27/2012  . Tobacco abuse 06/27/2012    ALLERGIES:  has No Known Allergies.  MEDICATIONS:  Current Outpatient Medications  Medication Sig Dispense Refill  . acetaminophen (TYLENOL) 325 MG tablet Take 650 mg by mouth every 6 (six) hours as needed for moderate pain.    Marland Kitchen albuterol (PROVENTIL HFA;VENTOLIN HFA) 108 (90 Base) MCG/ACT inhaler Inhale 2 puffs into the lungs every 6 (six) hours as needed for wheezing or shortness of breath.    Marland Kitchen atorvastatin (LIPITOR) 80 MG tablet TAKE ONE TABLET BY MOUTH DAILY AT 6 P.M. NEEDS APPOINTMENT FOR FUTURE REFILLS. (Patient taking differently: Take 80 mg by mouth daily at 6 PM. ) 90 tablet 3  . isosorbide mononitrate (IMDUR) 30 MG 24 hr tablet Take 1 tablet (30 mg total) by mouth daily. 90 tablet 3  . levothyroxine (SYNTHROID) 50 MCG tablet Take 1 tablet (50 mcg total) by mouth daily before breakfast. 30 tablet 1  . metoprolol tartrate (LOPRESSOR) 25 MG tablet Take 1 tablet (25 mg total) by mouth 2 (two) times daily. 60 tablet 3  . nitroGLYCERIN (NITROSTAT) 0.4 MG SL tablet Place 1 tablet (0.4 mg total) under the tongue every 5 (five) minutes as needed for chest pain. (Patient not taking: Reported on 12/15/2018) 25 tablet 3  . prochlorperazine (COMPAZINE) 10 MG tablet Take 1 tablet (10 mg total) by mouth every 6 (six) hours as needed for nausea or vomiting. (Patient not taking: Reported on 12/15/2018) 30 tablet 0  . rivaroxaban (XARELTO) 20 MG TABS  tablet Take 1 tablet (20 mg total) by mouth daily with supper. 30 tablet 1   No current facility-administered medications for this visit.     SURGICAL HISTORY:  Past Surgical History:  Procedure Laterality Date  . coratid arterty surgery    . CORONARY ANGIOPLASTY WITH STENT PLACEMENT    . CORONARY ARTERY BYPASS GRAFT    . IR GUIDED DRAIN W CATHETER PLACEMENT  04/08/2018  . IR REMOVAL OF PLURAL CATH W/CUFF  06/19/2018  . LCEA    . LEFT HEART CATH N/A 06/27/2012   Procedure: LEFT HEART CATH;  Surgeon: Troy Sine, MD;  Location: [facility] CATH LAB;  Service: Cardiovascular;  Laterality: N/A;  . triple bipass     Hx of CABG and RE do  . VASCULAR SURGERY    . VIDEO BRONCHOSCOPY Bilateral 04/07/2018   Procedure: VIDEO BRONCHOSCOPY WITHOUT FLUORO;  Surgeon: Rush Farmer, MD;  Location: [facility] ENDOSCOPY;  Service: Cardiopulmonary;  Laterality: Bilateral;    REVIEW OF SYSTEMS:  A comprehensive review of systems was negative except for: Constitutional: positive for fatigue Respiratory: positive for dyspnea on exertion   PHYSICAL EXAMINATION: General appearance: alert, cooperative, fatigued and no distress Head: Normocephalic, without obvious abnormality, atraumatic Neck: no adenopathy, no JVD, supple, symmetrical, trachea midline and thyroid not enlarged, symmetric, no tenderness/mass/nodules Lymph nodes: Cervical, supraclavicular, and axillary nodes normal. Resp: clear to auscultation bilaterally Back: symmetric, no curvature. ROM normal. No CVA tenderness. Cardio: regular rate and rhythm, S1, S2 normal, no murmur, click, rub or gallop GI: soft, non-tender; bowel sounds normal; no masses,  no organomegaly Extremities: extremities normal, atraumatic, no cyanosis or edema  ECOG PERFORMANCE STATUS: 1 - Symptomatic but completely ambulatory  Blood pressure 123/78, pulse 65, temperature 97.8 F (36.6 C), temperature source Oral, resp. rate 18, height 5\' 10"  (1.778 m), weight 159 lb 6.4 oz (72.3 kg), SpO2 99 %.  LABORATORY DATA: Lab Results  Component Value Date   WBC 5.2 02/16/2019   HGB 13.7 02/16/2019   HCT 42.7 02/16/2019   MCV 97.9 02/16/2019   PLT 170 02/16/2019      Chemistry      Component Value Date/Time   NA 138 02/16/2019 1030   NA 140 03/21/2018 1048   K 4.7 02/16/2019 1030   CL 109 02/16/2019 1030   CO2 20 (L) 02/16/2019 1030   BUN 31 (H) 02/16/2019 1030   BUN 45 (H) 03/21/2018 1048   CREATININE 2.29 (H) 02/16/2019 1030   CREATININE 1.80 (H) 09/06/2014 1033      Component Value  Date/Time   CALCIUM 9.3 02/16/2019 1030   ALKPHOS 73 02/16/2019 1030   AST 15 02/16/2019 1030   ALT 6 02/16/2019 1030   BILITOT 0.5 02/16/2019 1030       RADIOGRAPHIC STUDIES: No results found.  ASSESSMENT AND PLAN: This is a very pleasant 78 years old white male recently diagnosed with extensive stage small cell lung cancer and currently undergoing systemic chemotherapy with carboplatin, etoposide and Tecentriq status post 5 cycles. He is also status post 9 cycle of maintenance treatment with single agent Tecentriq.   He continues to tolerate this treatment well with no concerning adverse effects. Recommended for him to proceed with cycle #3 of his maintenance treatment today as scheduled. I will see him back for follow-up visit.  3 weeks for evaluation before the next cycle of his treatment with repeat CT scan of the chest, abdomen and pelvis for restaging of his disease. He was advised to  call immediately if he has any concerning symptoms in the interval. The patient voices understanding of current disease status and treatment options and is in agreement with the current care plan.  All questions were answered. The patient knows to call the clinic with any problems, questions or concerns. We can certainly see the patient much sooner if necessary.  Disclaimer: This note was dictated with voice recognition software. Similar sounding words can inadvertently be transcribed and may not be corrected upon review.

## 2019-02-16 NOTE — Progress Notes (Signed)
OK to treat with Creatinine of 2.29 per Dr. Julien Nordmann

## 2019-02-17 ENCOUNTER — Telehealth: Payer: Self-pay | Admitting: Physician Assistant

## 2019-02-17 ENCOUNTER — Other Ambulatory Visit: Payer: Self-pay | Admitting: Internal Medicine

## 2019-02-17 ENCOUNTER — Telehealth: Payer: Self-pay | Admitting: Internal Medicine

## 2019-02-17 DIAGNOSIS — R7989 Other specified abnormal findings of blood chemistry: Secondary | ICD-10-CM

## 2019-02-17 MED ORDER — LEVOTHYROXINE SODIUM 100 MCG PO TABS: 50.0000 ug | ORAL_TABLET | Freq: Every day | ORAL | 1 refills | Status: DC

## 2019-02-17 NOTE — Telephone Encounter (Signed)
Tried to reach regarding additions to schedule

## 2019-02-17 NOTE — Telephone Encounter (Signed)
Spoke to the patient regarding his elevated TSH and history of hypothyroidism. Recent lab work shows his TSH is 70.6. He is currently taking 45mcg of Synthroid. Dr. Julien Nordmann is going to send a refill to his pharmacy with an increased dose of 100 mcg p.o. daily. The patient was instructed to take 2 tablets of the 50 mcg prescription until he runs out. Once he runs out, he will start taking the 100 mcg tablet 1x a day by mouth. The patient expressed understanding. All questions answered.

## 2019-03-06 ENCOUNTER — Ambulatory Visit (HOSPITAL_COMMUNITY)
Admission: RE | Admit: 2019-03-06 | Discharge: 2019-03-06 | Disposition: A | Discharge status: Home / Self Care | Payer: Medicare HMO | Source: Ambulatory Visit | Attending: Internal Medicine | Admitting: Internal Medicine

## 2019-03-06 ENCOUNTER — Other Ambulatory Visit: Payer: Self-pay

## 2019-03-06 DIAGNOSIS — C3411 Malignant neoplasm of upper lobe, right bronchus or lung: Secondary | ICD-10-CM | POA: Diagnosis not present

## 2019-03-06 DIAGNOSIS — C3490 Malignant neoplasm of unspecified part of unspecified bronchus or lung: Secondary | ICD-10-CM | POA: Diagnosis not present

## 2019-03-09 ENCOUNTER — Inpatient Hospital Stay: Payer: Medicare HMO | Attending: Internal Medicine

## 2019-03-09 ENCOUNTER — Inpatient Hospital Stay: Payer: Medicare HMO

## 2019-03-09 ENCOUNTER — Other Ambulatory Visit: Payer: Self-pay

## 2019-03-09 ENCOUNTER — Inpatient Hospital Stay (HOSPITAL_BASED_OUTPATIENT_CLINIC_OR_DEPARTMENT_OTHER): Payer: Medicare HMO | Admitting: Internal Medicine

## 2019-03-09 ENCOUNTER — Encounter: Payer: Self-pay | Admitting: Internal Medicine

## 2019-03-09 VITALS — BP 123/74 | HR 72 | Temp 97.4°F | Resp 18 | Ht 70.0 in | Wt 157.7 lb

## 2019-03-09 DIAGNOSIS — C3411 Malignant neoplasm of upper lobe, right bronchus or lung: Secondary | ICD-10-CM

## 2019-03-09 DIAGNOSIS — Z5112 Encounter for antineoplastic immunotherapy: Secondary | ICD-10-CM

## 2019-03-09 DIAGNOSIS — J910 Malignant pleural effusion: Secondary | ICD-10-CM

## 2019-03-09 DIAGNOSIS — C342 Malignant neoplasm of middle lobe, bronchus or lung: Secondary | ICD-10-CM

## 2019-03-09 DIAGNOSIS — Z79899 Other long term (current) drug therapy: Secondary | ICD-10-CM | POA: Diagnosis not present

## 2019-03-09 DIAGNOSIS — N184 Chronic kidney disease, stage 4 (severe): Secondary | ICD-10-CM

## 2019-03-09 DIAGNOSIS — I10 Essential (primary) hypertension: Secondary | ICD-10-CM

## 2019-03-09 LAB — CBC WITH DIFFERENTIAL (CANCER CENTER ONLY)
Abs Immature Granulocytes: 0.02 10*3/uL (ref 0.00–0.07)
Basophils Absolute: 0.1 10*3/uL (ref 0.0–0.1)
Basophils Relative: 1 %
Eosinophils Absolute: 0.1 10*3/uL (ref 0.0–0.5)
Eosinophils Relative: 3 %
HCT: 42.4 % (ref 39.0–52.0)
Hemoglobin: 13.3 g/dL (ref 13.0–17.0)
Immature Granulocytes: 0 %
Lymphocytes Relative: 16 %
Lymphs Abs: 0.8 10*3/uL (ref 0.7–4.0)
MCH: 31.4 pg (ref 26.0–34.0)
MCHC: 31.4 g/dL (ref 30.0–36.0)
MCV: 100 fL (ref 80.0–100.0)
Monocytes Absolute: 0.5 10*3/uL (ref 0.1–1.0)
Monocytes Relative: 9 %
Neutro Abs: 3.9 10*3/uL (ref 1.7–7.7)
Neutrophils Relative %: 71 %
Platelet Count: 166 10*3/uL (ref 150–400)
RBC: 4.24 MIL/uL (ref 4.22–5.81)
RDW: 15.5 % (ref 11.5–15.5)
WBC Count: 5.4 10*3/uL (ref 4.0–10.5)
nRBC: 0 % (ref 0.0–0.2)

## 2019-03-09 LAB — CMP (CANCER CENTER ONLY)
ALT: 10 U/L (ref 0–44)
AST: 13 U/L — ABNORMAL LOW (ref 15–41)
Albumin: 3.3 g/dL — ABNORMAL LOW (ref 3.5–5.0)
Alkaline Phosphatase: 79 U/L (ref 38–126)
Anion gap: 9 (ref 5–15)
BUN: 29 mg/dL — ABNORMAL HIGH (ref 8–23)
CO2: 22 mmol/L (ref 22–32)
Calcium: 9 mg/dL (ref 8.9–10.3)
Chloride: 110 mmol/L (ref 98–111)
Creatinine: 2.35 mg/dL — ABNORMAL HIGH (ref 0.61–1.24)
GFR, Est AFR Am: 30 mL/min — ABNORMAL LOW (ref >60)
GFR, Estimated: 26 mL/min — ABNORMAL LOW (ref >60)
Glucose, Bld: 94 mg/dL (ref 70–99)
Potassium: 4.8 mmol/L (ref 3.5–5.1)
Sodium: 141 mmol/L (ref 135–145)
Total Bilirubin: 0.4 mg/dL (ref 0.3–1.2)
Total Protein: 7.1 g/dL (ref 6.5–8.1)

## 2019-03-09 LAB — TSH: TSH: 45.188 u[IU]/mL — ABNORMAL HIGH (ref 0.320–4.118)

## 2019-03-09 MED ORDER — SODIUM CHLORIDE 0.9 % IV SOLN
Freq: Once | INTRAVENOUS | Status: AC
  Administered 2019-03-09: 12:00:00 via INTRAVENOUS
  Filled 2019-03-09: qty 250

## 2019-03-09 MED ORDER — SODIUM CHLORIDE 0.9 % IV SOLN
1200.0000 mg | Freq: Once | INTRAVENOUS | Status: AC
  Administered 2019-03-09: 1200 mg via INTRAVENOUS
  Filled 2019-03-09: qty 20

## 2019-03-09 NOTE — Progress Notes (Signed)
Ok to treat today with abnormal cbc, cmp and tsh per MD Julien Nordmann

## 2019-03-09 NOTE — Progress Notes (Signed)
[place] [phone]   Fax:([phone]) [phone]  OFFICE PROGRESS NOTE  Patient, No Pcp Per No address on file  DIAGNOSIS: Extensive stage (T4, N2, M1a) small cell lung cancer presented with large right perihilar mass with mediastinal invasion and loculated malignant right pleural effusion diagnosed in June 2019.  PRIOR THERAPY:None.  CURRENT THERAPY: Systemic chemotherapy with carboplatin for AUC of 5 on day 1, etoposide 100 mg/M2 on days 1, 2 and 3 in addition to Tecentriq 1200 mg IV every 3 weeks with Neulasta support.  Status post 15 cycles.  Starting from cycle #6 the patient will be treated with maintenance single agent Tecentriq every 3 weeks  INTERVAL HISTORY: Christopher Wilson 78 y.o. male returns to the clinic today for follow-up visit.  The patient is feeling fine today with no concerning complaints except for mild fatigue.  He denied having any chest pain, shortness of breath, cough or hemoptysis.  He denied weight loss or night sweats.  He has no nausea, vomiting, diarrhea or constipation.  He has no fever or chills.  He denied having any headache or visual changes.  He continues to tolerate his treatment with Tecentriq fairly well.  The patient had repeat CT scan of the chest, abdomen and pelvis performed recently and he is here for evaluation and discussion of his scan results.   MEDICAL HISTORY: Past Medical History:  Diagnosis Date   ACS (acute coronary syndrome), with ST elevation but stable coronary arteries on cath 06/27/12 06/27/2012   CAD (coronary artery disease), with CABG 1989 and re-do Cabg 2002 X 3 vesssels.  progressive disease with Stent to Lt. 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  MEDICATIONS:  Current Outpatient Medications  Medication Sig Dispense Refill   acetaminophen (TYLENOL) 325 MG tablet Take 650 mg by mouth every 6 (six) hours as needed for moderate pain.     albuterol (PROVENTIL HFA;VENTOLIN HFA) 108 (90 Base) MCG/ACT inhaler Inhale 2 puffs into the lungs every 6 (six) hours as needed for wheezing or shortness of breath.     atorvastatin (LIPITOR) 80 MG tablet TAKE ONE TABLET BY MOUTH DAILY AT 6 P.M. NEEDS APPOINTMENT FOR FUTURE REFILLS. (Patient taking differently: Take 80 mg by mouth daily at 6 PM. ) 90 tablet 3   isosorbide mononitrate (IMDUR) 30 MG 24 hr tablet Take 1 tablet (30 mg total) by mouth daily. 90 tablet 3   levothyroxine (SYNTHROID) 100 MCG tablet Take 0.5 tablets (50 mcg total) by mouth daily before breakfast. 30 tablet 1   metoprolol tartrate (LOPRESSOR) 25 MG tablet Take 1 tablet (25 mg total) by mouth 2 (two) times daily. 60 tablet 3   nitroGLYCERIN (NITROSTAT) 0.4 MG SL tablet Place 1 tablet (0.4 mg total) under the tongue every 5 (five) minutes as needed for chest pain. (Patient not taking: Reported on 12/15/2018) 25 tablet 3   prochlorperazine (COMPAZINE) 10 MG tablet Take 1 tablet (10 mg total) by mouth  every 6 (six) hours as needed for nausea or vomiting. (Patient not taking: Reported on 12/15/2018) 30 tablet 0   rivaroxaban (XARELTO) 20 MG TABS tablet Take 1 tablet (20 mg total) by mouth daily with supper. 30 tablet 1   No current facility-administered medications for this visit.     SURGICAL HISTORY:  Past Surgical History:  Procedure Laterality Date   coratid arterty surgery     CORONARY ANGIOPLASTY WITH STENT PLACEMENT     CORONARY ARTERY BYPASS GRAFT     IR GUIDED DRAIN W CATHETER PLACEMENT  04/08/2018    IR REMOVAL OF PLURAL CATH W/CUFF  06/19/2018   LCEA     LEFT HEART CATH N/A 06/27/2012   Procedure: LEFT HEART CATH;  Surgeon: Troy Sine, MD;  Location: [facility] CATH LAB;  Service: Cardiovascular;  Laterality: N/A;   triple bipass     Hx of CABG and RE do   VASCULAR SURGERY     VIDEO BRONCHOSCOPY Bilateral 04/07/2018   Procedure: VIDEO BRONCHOSCOPY WITHOUT FLUORO;  Surgeon: Rush Farmer, MD;  Location: [facility] ENDOSCOPY;  Service: Cardiopulmonary;  Laterality: Bilateral;    REVIEW OF SYSTEMS:  Constitutional: positive for fatigue Eyes: negative Ears, nose, mouth, throat, and face: negative Respiratory: negative Cardiovascular: negative Gastrointestinal: negative Genitourinary:negative Integument/breast: negative Hematologic/lymphatic: negative Musculoskeletal:negative Neurological: negative Behavioral/Psych: negative Endocrine: negative Allergic/Immunologic: negative   PHYSICAL EXAMINATION: General appearance: alert, cooperative, fatigued and no distress Head: Normocephalic, without obvious abnormality, atraumatic Neck: no adenopathy, no JVD, supple, symmetrical, trachea midline and thyroid not enlarged, symmetric, no tenderness/mass/nodules Lymph nodes: Cervical, supraclavicular, and axillary nodes normal. Resp: clear to auscultation bilaterally Back: symmetric, no curvature. ROM normal. No CVA tenderness. Cardio: regular rate and rhythm, S1, S2 normal, no murmur, click, rub or gallop GI: soft, non-tender; bowel sounds normal; no masses,  no organomegaly Extremities: extremities normal, atraumatic, no cyanosis or edema Neurologic: Alert and oriented X 3, normal strength and tone. Normal symmetric reflexes. Normal coordination and gait  ECOG PERFORMANCE STATUS: 1 - Symptomatic but completely ambulatory  Blood pressure 123/74, pulse 72, temperature (!) 97.4 F (36.3 C), temperature source Oral, resp. rate 18, height 5\' 10"  (1.778 m), weight 157 lb 11.2 oz (71.5 kg), SpO2  100 %.  LABORATORY DATA: Lab Results  Component Value Date   WBC 5.4 03/09/2019   HGB 13.3 03/09/2019   HCT 42.4 03/09/2019   MCV 100.0 03/09/2019   PLT 166 03/09/2019      Chemistry      Component Value Date/Time   NA 138 02/16/2019 1030   NA 140 03/21/2018 1048   K 4.7 02/16/2019 1030   CL 109 02/16/2019 1030   CO2 20 (L) 02/16/2019 1030   BUN 31 (H) 02/16/2019 1030   BUN 45 (H) 03/21/2018 1048   CREATININE 2.29 (H) 02/16/2019 1030   CREATININE 1.80 (H) 09/06/2014 1033      Component Value Date/Time   CALCIUM 9.3 02/16/2019 1030   ALKPHOS 73 02/16/2019 1030   AST 15 02/16/2019 1030   ALT 6 02/16/2019 1030   BILITOT 0.5 02/16/2019 1030       RADIOGRAPHIC STUDIES: Ct Abdomen Pelvis Wo Contrast  Result Date: 03/06/2019 CLINICAL DATA:  Extensive stage small cell lung cancer, diagnosed June 2019, status post chemotherapy EXAM: CT CHEST, ABDOMEN AND PELVIS WITHOUT CONTRAST TECHNIQUE: Multidetector CT imaging of the chest, abdomen and pelvis was performed following the standard protocol without IV contrast. COMPARISON:  01/01/2019 FINDINGS: CT CHEST FINDINGS Cardiovascular: Heart is normal in size.  Subendocardial fat along the left ventricle, likely reflecting sequela of prior MI. No pericardial effusion. No evidence of thoracic aortic aneurysm. Atherosclerotic calcifications of the aortic arch. Three vessel coronary atherosclerosis. Postsurgical changes related to prior CABG. Mediastinum/Nodes: 8 mm short axis low right paratracheal node (series 2/image 27), unchanged. Visualized thyroid is unremarkable. Lungs/Pleura: Biapical pleural-parenchymal scarring. Stable 13 mm nodular opacity in the right lung apex (series 4/image 24). Stable masslike opacity in the central right upper lobe, measuring 3.8 x 3.0 cm (series 4/image 36), previously 3.8 x 3.2 cm. Mild post infectious/inflammatory scarring in the right lower lobe. Mild centrilobular and paraseptal emphysematous changes, upper  lobe predominant. No focal consolidation. No pleural effusion or pneumothorax. Musculoskeletal: Degenerative changes of the thoracic spine. Median sternotomy. CT ABDOMEN PELVIS FINDINGS Hepatobiliary: Stable 9 mm cyst in segment 2 (series 2/image 52). Gallbladder is unremarkable. No intrahepatic or extrahepatic ductal dilatation. Pancreas: Within normal limits. Spleen: Within normal limits. Adrenals/Urinary Tract: Adrenal glands are within normal limits. Severe right renal atrophy. Stable rim calcified lesion in the right lower pole measuring 13 mm (series 2/image 34). At least 6 nonobstructing left renal calculi measuring up to 8 mm in the left upper pole. 8 mm hyperdense left upper pole renal cyst (series 2/image 31). 2.8 cm cystic lesion in the lateral interpolar left kidney (series 2/image 77). No hydronephrosis. Bladder is within normal limits. Stomach/Bowel: Stomach is within normal limits. No evidence of bowel obstruction. Extensive colonic diverticulosis, without evidence of diverticulitis. Vascular/Lymphatic: 3.7 cm infrarenal abdominal aortic aneurysm, unchanged. Atherosclerotic calcifications of the abdominal aorta and branch vessels. No suspicious abdominopelvic lymphadenopathy. Reproductive: Prostate is notable for dystrophic calcifications. Other: No abdominopelvic ascites. Musculoskeletal: Degenerative changes of the lumbar spine. IMPRESSION: Stable 3.8 cm right upper lobe mass. Stable 13 mm right upper lobe nodule. No suspicious thoracic lymphadenopathy. No evidence of new/progressive metastatic disease. 3.7 cm infrarenal abdominal aortic aneurysm, unchanged. Recommend followup by ultrasound in 2 years. This recommendation follows ACR consensus guidelines: White Paper of the ACR Incidental Findings Committee II on Vascular Findings. J Am Coll Radiol 2013; 10:789-794. Aortic aneurysm NOS (ICD10-I71.9) Additional stable ancillary findings as above. Aortic Atherosclerosis (ICD10-I70.0) and Emphysema  (ICD10-J43.9). Electronically Signed   By: Julian Hy M.D.   On: 03/06/2019 17:57   Ct Chest Wo Contrast  Result Date: 03/06/2019 CLINICAL DATA:  Extensive stage small cell lung cancer, diagnosed June 2019, status post chemotherapy EXAM: CT CHEST, ABDOMEN AND PELVIS WITHOUT CONTRAST TECHNIQUE: Multidetector CT imaging of the chest, abdomen and pelvis was performed following the standard protocol without IV contrast. COMPARISON:  01/01/2019 FINDINGS: CT CHEST FINDINGS Cardiovascular: Heart is normal in size. Subendocardial fat along the left ventricle, likely reflecting sequela of prior MI. No pericardial effusion. No evidence of thoracic aortic aneurysm. Atherosclerotic calcifications of the aortic arch. Three vessel coronary atherosclerosis. Postsurgical changes related to prior CABG. Mediastinum/Nodes: 8 mm short axis low right paratracheal node (series 2/image 27), unchanged. Visualized thyroid is unremarkable. Lungs/Pleura: Biapical pleural-parenchymal scarring. Stable 13 mm nodular opacity in the right lung apex (series 4/image 24). Stable masslike opacity in the central right upper lobe, measuring 3.8 x 3.0 cm (series 4/image 36), previously 3.8 x 3.2 cm. Mild post infectious/inflammatory scarring in the right lower lobe. Mild centrilobular and paraseptal emphysematous changes, upper lobe predominant. No focal consolidation. No pleural effusion or pneumothorax. Musculoskeletal: Degenerative changes of the thoracic spine. Median sternotomy. CT ABDOMEN PELVIS FINDINGS Hepatobiliary: Stable 9 mm cyst in segment 2 (series 2/image 52). Gallbladder is unremarkable. No intrahepatic  or extrahepatic ductal dilatation. Pancreas: Within normal limits. Spleen: Within normal limits. Adrenals/Urinary Tract: Adrenal glands are within normal limits. Severe right renal atrophy. Stable rim calcified lesion in the right lower pole measuring 13 mm (series 2/image 34). At least 6 nonobstructing left renal calculi  measuring up to 8 mm in the left upper pole. 8 mm hyperdense left upper pole renal cyst (series 2/image 31). 2.8 cm cystic lesion in the lateral interpolar left kidney (series 2/image 77). No hydronephrosis. Bladder is within normal limits. Stomach/Bowel: Stomach is within normal limits. No evidence of bowel obstruction. Extensive colonic diverticulosis, without evidence of diverticulitis. Vascular/Lymphatic: 3.7 cm infrarenal abdominal aortic aneurysm, unchanged. Atherosclerotic calcifications of the abdominal aorta and branch vessels. No suspicious abdominopelvic lymphadenopathy. Reproductive: Prostate is notable for dystrophic calcifications. Other: No abdominopelvic ascites. Musculoskeletal: Degenerative changes of the lumbar spine. IMPRESSION: Stable 3.8 cm right upper lobe mass. Stable 13 mm right upper lobe nodule. No suspicious thoracic lymphadenopathy. No evidence of new/progressive metastatic disease. 3.7 cm infrarenal abdominal aortic aneurysm, unchanged. Recommend followup by ultrasound in 2 years. This recommendation follows ACR consensus guidelines: White Paper of the ACR Incidental Findings Committee II on Vascular Findings. J Am Coll Radiol 2013; 10:789-794. Aortic aneurysm NOS (ICD10-I71.9) Additional stable ancillary findings as above. Aortic Atherosclerosis (ICD10-I70.0) and Emphysema (ICD10-J43.9). Electronically Signed   By: Julian Hy M.D.   On: 03/06/2019 17:57    ASSESSMENT AND PLAN: This is a very pleasant 78 years old white male recently diagnosed with extensive stage small cell lung cancer and currently undergoing systemic chemotherapy with carboplatin, etoposide and Tecentriq status post 5 cycles. He is also status post 10 cycle of maintenance treatment with single agent Tecentriq.   He has been tolerating this treatment well with no concerning adverse effects. The patient had repeat CT scan of the chest, abdomen and pelvis performed recently.  I personally and independently  and discussed the results with the patient today. His a scan showed no concerning findings for disease progression. I recommended for the patient to continue his current treatment with Tecentriq and he will proceed with cycle #11 today. The patient will come back for follow-up visit in 3 weeks for evaluation with the next cycle of his treatment. He was advised to call immediately if he has any concerning symptoms in the interval. The patient voices understanding of current disease status and treatment options and is in agreement with the current care plan.  All questions were answered. The patient knows to call the clinic with any problems, questions or concerns. We can certainly see the patient much sooner if necessary.  Disclaimer: This note was dictated with voice recognition software. Similar sounding words can inadvertently be transcribed and may not be corrected upon review.

## 2019-03-09 NOTE — Patient Instructions (Signed)
[place] Discharge Instructions for Patients Receiving Chemotherapy  Today you received the following chemotherapy agent: Tecentriq.  To help prevent nausea and vomiting after your treatment, we encourage you to take your nausea medication as directed   If you develop nausea and vomiting that is not controlled by your nausea medication, call the clinic.   BELOW ARE SYMPTOMS THAT SHOULD BE REPORTED IMMEDIATELY:  *FEVER GREATER THAN 100.5 F  *CHILLS WITH OR WITHOUT FEVER  NAUSEA AND VOMITING THAT IS NOT CONTROLLED WITH YOUR NAUSEA MEDICATION  *UNUSUAL SHORTNESS OF BREATH  *UNUSUAL BRUISING OR BLEEDING  TENDERNESS IN MOUTH AND THROAT WITH OR WITHOUT PRESENCE OF ULCERS  *URINARY PROBLEMS  *BOWEL PROBLEMS  UNUSUAL RASH Items with * indicate a potential emergency and should be followed up as soon as possible.  Feel free to call the clinic should you have any questions or concerns. The clinic phone number is [phone].  Please show the [place] at check-in to the Emergency Department and triage nurse.

## 2019-03-24 ENCOUNTER — Telehealth: Payer: Self-pay | Admitting: Internal Medicine

## 2019-03-24 NOTE — Telephone Encounter (Signed)
MM AM PAL moved 6/22 appointments to PM with Cassie. Confirmed with patient.

## 2019-03-30 ENCOUNTER — Inpatient Hospital Stay: Payer: Medicare HMO | Attending: Internal Medicine

## 2019-03-30 ENCOUNTER — Other Ambulatory Visit: Payer: Self-pay

## 2019-03-30 ENCOUNTER — Inpatient Hospital Stay: Payer: Medicare HMO

## 2019-03-30 ENCOUNTER — Encounter: Payer: Self-pay | Admitting: Internal Medicine

## 2019-03-30 ENCOUNTER — Inpatient Hospital Stay (HOSPITAL_BASED_OUTPATIENT_CLINIC_OR_DEPARTMENT_OTHER): Payer: Medicare HMO | Admitting: Internal Medicine

## 2019-03-30 VITALS — BP 114/73 | HR 69 | Temp 98.2°F | Resp 18 | Ht 70.0 in | Wt 157.6 lb

## 2019-03-30 DIAGNOSIS — E039 Hypothyroidism, unspecified: Secondary | ICD-10-CM | POA: Diagnosis not present

## 2019-03-30 DIAGNOSIS — Z5112 Encounter for antineoplastic immunotherapy: Secondary | ICD-10-CM | POA: Insufficient documentation

## 2019-03-30 DIAGNOSIS — I129 Hypertensive chronic kidney disease with stage 1 through stage 4 chronic kidney disease, or unspecified chronic kidney disease: Secondary | ICD-10-CM | POA: Insufficient documentation

## 2019-03-30 DIAGNOSIS — Z79899 Other long term (current) drug therapy: Secondary | ICD-10-CM | POA: Diagnosis not present

## 2019-03-30 DIAGNOSIS — J910 Malignant pleural effusion: Secondary | ICD-10-CM | POA: Insufficient documentation

## 2019-03-30 DIAGNOSIS — C3411 Malignant neoplasm of upper lobe, right bronchus or lung: Secondary | ICD-10-CM

## 2019-03-30 DIAGNOSIS — Z7901 Long term (current) use of anticoagulants: Secondary | ICD-10-CM | POA: Insufficient documentation

## 2019-03-30 DIAGNOSIS — N183 Chronic kidney disease, stage 3 (moderate): Secondary | ICD-10-CM | POA: Diagnosis not present

## 2019-03-30 DIAGNOSIS — C342 Malignant neoplasm of middle lobe, bronchus or lung: Secondary | ICD-10-CM | POA: Diagnosis not present

## 2019-03-30 DIAGNOSIS — C3490 Malignant neoplasm of unspecified part of unspecified bronchus or lung: Secondary | ICD-10-CM

## 2019-03-30 DIAGNOSIS — Z9221 Personal history of antineoplastic chemotherapy: Secondary | ICD-10-CM | POA: Diagnosis not present

## 2019-03-30 LAB — CBC WITH DIFFERENTIAL (CANCER CENTER ONLY)
Abs Immature Granulocytes: 0.01 10*3/uL (ref 0.00–0.07)
Basophils Absolute: 0.1 10*3/uL (ref 0.0–0.1)
Basophils Relative: 1 %
Eosinophils Absolute: 0.2 10*3/uL (ref 0.0–0.5)
Eosinophils Relative: 3 %
HCT: 44.2 % (ref 39.0–52.0)
Hemoglobin: 13.9 g/dL (ref 13.0–17.0)
Immature Granulocytes: 0 %
Lymphocytes Relative: 19 %
Lymphs Abs: 1 10*3/uL (ref 0.7–4.0)
MCH: 31.3 pg (ref 26.0–34.0)
MCHC: 31.4 g/dL (ref 30.0–36.0)
MCV: 99.5 fL (ref 80.0–100.0)
Monocytes Absolute: 0.5 10*3/uL (ref 0.1–1.0)
Monocytes Relative: 9 %
Neutro Abs: 3.6 10*3/uL (ref 1.7–7.7)
Neutrophils Relative %: 68 %
Platelet Count: 172 10*3/uL (ref 150–400)
RBC: 4.44 MIL/uL (ref 4.22–5.81)
RDW: 14.6 % (ref 11.5–15.5)
WBC Count: 5.3 10*3/uL (ref 4.0–10.5)
nRBC: 0 % (ref 0.0–0.2)

## 2019-03-30 LAB — CMP (CANCER CENTER ONLY)
ALT: 9 U/L (ref 0–44)
AST: 14 U/L — ABNORMAL LOW (ref 15–41)
Albumin: 3.4 g/dL — ABNORMAL LOW (ref 3.5–5.0)
Alkaline Phosphatase: 74 U/L (ref 38–126)
Anion gap: 8 (ref 5–15)
BUN: 33 mg/dL — ABNORMAL HIGH (ref 8–23)
CO2: 23 mmol/L (ref 22–32)
Calcium: 9.4 mg/dL (ref 8.9–10.3)
Chloride: 107 mmol/L (ref 98–111)
Creatinine: 2.36 mg/dL — ABNORMAL HIGH (ref 0.61–1.24)
GFR, Est AFR Am: 30 mL/min — ABNORMAL LOW (ref >60)
GFR, Estimated: 26 mL/min — ABNORMAL LOW (ref >60)
Glucose, Bld: 93 mg/dL (ref 70–99)
Potassium: 5.2 mmol/L — ABNORMAL HIGH (ref 3.5–5.1)
Sodium: 138 mmol/L (ref 135–145)
Total Bilirubin: 0.4 mg/dL (ref 0.3–1.2)
Total Protein: 7.1 g/dL (ref 6.5–8.1)

## 2019-03-30 LAB — TSH: TSH: 39.51 u[IU]/mL — ABNORMAL HIGH (ref 0.320–4.118)

## 2019-03-30 MED ORDER — SODIUM CHLORIDE 0.9 % IV SOLN
1200.0000 mg | Freq: Once | INTRAVENOUS | Status: AC
  Administered 2019-03-30: 1200 mg via INTRAVENOUS
  Filled 2019-03-30: qty 20

## 2019-03-30 MED ORDER — SODIUM CHLORIDE 0.9 % IV SOLN
Freq: Once | INTRAVENOUS | Status: AC
  Administered 2019-03-30: 11:00:00 via INTRAVENOUS
  Filled 2019-03-30: qty 250

## 2019-03-30 NOTE — Patient Instructions (Signed)
[place] Discharge Instructions for Patients Receiving Chemotherapy  Today you received the following chemotherapy agent: Tecentriq.  To help prevent nausea and vomiting after your treatment, we encourage you to take your nausea medication as directed   If you develop nausea and vomiting that is not controlled by your nausea medication, call the clinic.   BELOW ARE SYMPTOMS THAT SHOULD BE REPORTED IMMEDIATELY:  *FEVER GREATER THAN 100.5 F  *CHILLS WITH OR WITHOUT FEVER  NAUSEA AND VOMITING THAT IS NOT CONTROLLED WITH YOUR NAUSEA MEDICATION  *UNUSUAL SHORTNESS OF BREATH  *UNUSUAL BRUISING OR BLEEDING  TENDERNESS IN MOUTH AND THROAT WITH OR WITHOUT PRESENCE OF ULCERS  *URINARY PROBLEMS  *BOWEL PROBLEMS  UNUSUAL RASH Items with * indicate a potential emergency and should be followed up as soon as possible.  Feel free to call the clinic should you have any questions or concerns. The clinic phone number is [phone].  Please show the [place] at check-in to the Emergency Department and triage nurse.

## 2019-03-30 NOTE — Progress Notes (Signed)
[place] [phone]   [phone]  OFFICE PROGRESS NOTE  Patient, No Pcp Per No address on file  DIAGNOSIS: Extensive stage (T4, N2, M1a) small cell lung cancer presented with large right perihilar mass with mediastinal invasion and loculated malignant right pleural effusion diagnosed in June 2019.  PRIOR THERAPY:None.  CURRENT THERAPY: Systemic chemotherapy with carboplatin for AUC of 5 on day 1, etoposide 100 mg/M2 on days 1, 2 and 3 in addition to Tecentriq 1200 mg IV every 3 weeks with Neulasta support.  Status post 16 cycles.  Starting from cycle #6 the patient will be treated with maintenance single agent Tecentriq every 3 weeks  INTERVAL HISTORY: Christopher Wilson 78 y.o. male returns to the clinic today for follow-up visit.  The patient is feeling fine today with no concerning complaints.  He denied having any chest pain, shortness of breath, cough or hemoptysis.  He denied having any fever or chills.  He has no nausea, vomiting, diarrhea or constipation.  He denied having any significant weight loss or night sweats.  He continues to tolerate his treatment fairly well.  He is here today for evaluation before starting cycle #17.  MEDICAL HISTORY: Past Medical History:  Diagnosis Date   ACS (acute coronary syndrome), with ST elevation but stable coronary arteries on cath 06/27/12 06/27/2012   CAD (coronary artery disease), with CABG 1989 and re-do Cabg 2002 X 3 vesssels.  progressive disease with Stent to Lt. 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)    Peptic ulcer    STEMI (ST elevation myocardial infarction) ([place])  06/27/2012   Tobacco abuse 06/27/2012    ALLERGIES:  has No Known Allergies.  MEDICATIONS:  Current Outpatient Medications  Medication Sig Dispense Refill   acetaminophen (TYLENOL) 325 MG tablet Take 650 mg by mouth every 6 (six) hours as needed for moderate pain.     albuterol (PROVENTIL HFA;VENTOLIN HFA) 108 (90 Base) MCG/ACT inhaler Inhale 2 puffs into the lungs every 6 (six) hours as needed for wheezing or shortness of breath.     atorvastatin (LIPITOR) 80 MG tablet TAKE ONE TABLET BY MOUTH DAILY AT 6 P.M. NEEDS APPOINTMENT FOR FUTURE REFILLS. (Patient taking differently: Take 80 mg by mouth daily at 6 PM. ) 90 tablet 3   isosorbide mononitrate (IMDUR) 30 MG 24 hr tablet Take 1 tablet (30 mg total) by mouth daily. 90 tablet 3   levothyroxine (SYNTHROID) 100 MCG tablet Take 0.5 tablets (50 mcg total) by mouth daily before breakfast. 30 tablet 1   metoprolol tartrate (LOPRESSOR) 25 MG tablet Take 1 tablet (25 mg total) by mouth 2 (two) times daily. 60 tablet 3   nitroGLYCERIN (NITROSTAT) 0.4 MG SL tablet Place 1 tablet (0.4 mg total) under the tongue every 5 (five) minutes as needed for chest pain. (Patient not taking: Reported on 12/15/2018) 25 tablet 3   prochlorperazine (COMPAZINE) 10 MG tablet Take 1 tablet (10 mg total) by mouth every 6 (six) hours as needed for nausea or vomiting. (Patient not taking: Reported on 12/15/2018) 30 tablet 0   rivaroxaban (XARELTO) 20 MG TABS tablet Take  1 tablet (20 mg total) by mouth daily with supper. 30 tablet 1   No current facility-administered medications for this visit.     SURGICAL HISTORY:  Past Surgical History:  Procedure Laterality Date   coratid arterty surgery     CORONARY ANGIOPLASTY WITH STENT PLACEMENT     CORONARY ARTERY BYPASS GRAFT     IR GUIDED DRAIN W CATHETER PLACEMENT  04/08/2018   IR REMOVAL OF PLURAL CATH W/CUFF  06/19/2018   LCEA     LEFT HEART CATH N/A 06/27/2012   Procedure: LEFT HEART CATH;  Surgeon: Troy Sine, MD;  Location: [facility] CATH LAB;  Service: Cardiovascular;  Laterality: N/A;   triple bipass     Hx of CABG and RE do   VASCULAR SURGERY     VIDEO BRONCHOSCOPY Bilateral 04/07/2018   Procedure: VIDEO BRONCHOSCOPY WITHOUT FLUORO;  Surgeon: Rush Farmer, MD;  Location: [facility] ENDOSCOPY;  Service: Cardiopulmonary;  Laterality: Bilateral;    REVIEW OF SYSTEMS:  A comprehensive review of systems was negative.   PHYSICAL EXAMINATION: General appearance: alert, cooperative and no distress Head: Normocephalic, without obvious abnormality, atraumatic Neck: no adenopathy, no JVD, supple, symmetrical, trachea midline and thyroid not enlarged, symmetric, no tenderness/mass/nodules Lymph nodes: Cervical, supraclavicular, and axillary nodes normal. Resp: clear to auscultation bilaterally Back: symmetric, no curvature. ROM normal. No CVA tenderness. Cardio: regular rate and rhythm, S1, S2 normal, no murmur, click, rub or gallop GI: soft, non-tender; bowel sounds normal; no masses,  no organomegaly Extremities: extremities normal, atraumatic, no cyanosis or edema  ECOG PERFORMANCE STATUS: 1 - Symptomatic but completely ambulatory  Blood pressure 114/73, pulse 69, temperature 98.2 F (36.8 C), temperature source Oral, resp. rate 18, height 5\' 10"  (1.778 m), weight 157 lb 9.6 oz (71.5 kg), SpO2 98 %.  LABORATORY DATA: Lab Results  Component Value Date   WBC 5.3 03/30/2019   HGB 13.9 03/30/2019   HCT 44.2 03/30/2019   MCV 99.5 03/30/2019   PLT 172 03/30/2019      Chemistry      Component Value Date/Time   NA 141 03/09/2019 1033   NA 140 03/21/2018 1048   K 4.8 03/09/2019 1033   CL 110 03/09/2019 1033   CO2 22 03/09/2019 1033   BUN 29 (H) 03/09/2019 1033   BUN 45 (H) 03/21/2018 1048   CREATININE 2.35 (H) 03/09/2019 1033   CREATININE 1.80 (H) 09/06/2014 1033      Component Value Date/Time   CALCIUM 9.0 03/09/2019 1033   ALKPHOS 79 03/09/2019 1033   AST 13 (L) 03/09/2019 1033   ALT  10 03/09/2019 1033   BILITOT 0.4 03/09/2019 1033       RADIOGRAPHIC STUDIES: Ct Abdomen Pelvis Wo Contrast  Result Date: 03/06/2019 CLINICAL DATA:  Extensive stage small cell lung cancer, diagnosed June 2019, status post chemotherapy EXAM: CT CHEST, ABDOMEN AND PELVIS WITHOUT CONTRAST TECHNIQUE: Multidetector CT imaging of the chest, abdomen and pelvis was performed following the standard protocol without IV contrast. COMPARISON:  01/01/2019 FINDINGS: CT CHEST FINDINGS Cardiovascular: Heart is normal in size. Subendocardial fat along the left ventricle, likely reflecting sequela of prior MI. No pericardial effusion. No evidence of thoracic aortic aneurysm. Atherosclerotic calcifications of the aortic arch. Three vessel coronary atherosclerosis. Postsurgical changes related to prior CABG. Mediastinum/Nodes: 8 mm short axis low right paratracheal node (series 2/image 27), unchanged. Visualized thyroid is unremarkable. Lungs/Pleura: Biapical pleural-parenchymal scarring. Stable 13 mm nodular opacity in the right lung apex (series 4/image 24). Stable masslike  opacity in the central right upper lobe, measuring 3.8 x 3.0 cm (series 4/image 36), previously 3.8 x 3.2 cm. Mild post infectious/inflammatory scarring in the right lower lobe. Mild centrilobular and paraseptal emphysematous changes, upper lobe predominant. No focal consolidation. No pleural effusion or pneumothorax. Musculoskeletal: Degenerative changes of the thoracic spine. Median sternotomy. CT ABDOMEN PELVIS FINDINGS Hepatobiliary: Stable 9 mm cyst in segment 2 (series 2/image 52). Gallbladder is unremarkable. No intrahepatic or extrahepatic ductal dilatation. Pancreas: Within normal limits. Spleen: Within normal limits. Adrenals/Urinary Tract: Adrenal glands are within normal limits. Severe right renal atrophy. Stable rim calcified lesion in the right lower pole measuring 13 mm (series 2/image 34). At least 6 nonobstructing left renal calculi  measuring up to 8 mm in the left upper pole. 8 mm hyperdense left upper pole renal cyst (series 2/image 31). 2.8 cm cystic lesion in the lateral interpolar left kidney (series 2/image 77). No hydronephrosis. Bladder is within normal limits. Stomach/Bowel: Stomach is within normal limits. No evidence of bowel obstruction. Extensive colonic diverticulosis, without evidence of diverticulitis. Vascular/Lymphatic: 3.7 cm infrarenal abdominal aortic aneurysm, unchanged. Atherosclerotic calcifications of the abdominal aorta and branch vessels. No suspicious abdominopelvic lymphadenopathy. Reproductive: Prostate is notable for dystrophic calcifications. Other: No abdominopelvic ascites. Musculoskeletal: Degenerative changes of the lumbar spine. IMPRESSION: Stable 3.8 cm right upper lobe mass. Stable 13 mm right upper lobe nodule. No suspicious thoracic lymphadenopathy. No evidence of new/progressive metastatic disease. 3.7 cm infrarenal abdominal aortic aneurysm, unchanged. Recommend followup by ultrasound in 2 years. This recommendation follows ACR consensus guidelines: White Paper of the ACR Incidental Findings Committee II on Vascular Findings. J Am Coll Radiol 2013; 10:789-794. Aortic aneurysm NOS (ICD10-I71.9) Additional stable ancillary findings as above. Aortic Atherosclerosis (ICD10-I70.0) and Emphysema (ICD10-J43.9). Electronically Signed   By: Julian Hy M.D.   On: 03/06/2019 17:57   Ct Chest Wo Contrast  Result Date: 03/06/2019 CLINICAL DATA:  Extensive stage small cell lung cancer, diagnosed June 2019, status post chemotherapy EXAM: CT CHEST, ABDOMEN AND PELVIS WITHOUT CONTRAST TECHNIQUE: Multidetector CT imaging of the chest, abdomen and pelvis was performed following the standard protocol without IV contrast. COMPARISON:  01/01/2019 FINDINGS: CT CHEST FINDINGS Cardiovascular: Heart is normal in size. Subendocardial fat along the left ventricle, likely reflecting sequela of prior MI. No pericardial  effusion. No evidence of thoracic aortic aneurysm. Atherosclerotic calcifications of the aortic arch. Three vessel coronary atherosclerosis. Postsurgical changes related to prior CABG. Mediastinum/Nodes: 8 mm short axis low right paratracheal node (series 2/image 27), unchanged. Visualized thyroid is unremarkable. Lungs/Pleura: Biapical pleural-parenchymal scarring. Stable 13 mm nodular opacity in the right lung apex (series 4/image 24). Stable masslike opacity in the central right upper lobe, measuring 3.8 x 3.0 cm (series 4/image 36), previously 3.8 x 3.2 cm. Mild post infectious/inflammatory scarring in the right lower lobe. Mild centrilobular and paraseptal emphysematous changes, upper lobe predominant. No focal consolidation. No pleural effusion or pneumothorax. Musculoskeletal: Degenerative changes of the thoracic spine. Median sternotomy. CT ABDOMEN PELVIS FINDINGS Hepatobiliary: Stable 9 mm cyst in segment 2 (series 2/image 52). Gallbladder is unremarkable. No intrahepatic or extrahepatic ductal dilatation. Pancreas: Within normal limits. Spleen: Within normal limits. Adrenals/Urinary Tract: Adrenal glands are within normal limits. Severe right renal atrophy. Stable rim calcified lesion in the right lower pole measuring 13 mm (series 2/image 34). At least 6 nonobstructing left renal calculi measuring up to 8 mm in the left upper pole. 8 mm hyperdense left upper pole renal cyst (series 2/image 31). 2.8 cm cystic lesion in the  lateral interpolar left kidney (series 2/image 77). No hydronephrosis. Bladder is within normal limits. Stomach/Bowel: Stomach is within normal limits. No evidence of bowel obstruction. Extensive colonic diverticulosis, without evidence of diverticulitis. Vascular/Lymphatic: 3.7 cm infrarenal abdominal aortic aneurysm, unchanged. Atherosclerotic calcifications of the abdominal aorta and branch vessels. No suspicious abdominopelvic lymphadenopathy. Reproductive: Prostate is notable for  dystrophic calcifications. Other: No abdominopelvic ascites. Musculoskeletal: Degenerative changes of the lumbar spine. IMPRESSION: Stable 3.8 cm right upper lobe mass. Stable 13 mm right upper lobe nodule. No suspicious thoracic lymphadenopathy. No evidence of new/progressive metastatic disease. 3.7 cm infrarenal abdominal aortic aneurysm, unchanged. Recommend followup by ultrasound in 2 years. This recommendation follows ACR consensus guidelines: White Paper of the ACR Incidental Findings Committee II on Vascular Findings. J Am Coll Radiol 2013; 10:789-794. Aortic aneurysm NOS (ICD10-I71.9) Additional stable ancillary findings as above. Aortic Atherosclerosis (ICD10-I70.0) and Emphysema (ICD10-J43.9). Electronically Signed   By: Julian Hy M.D.   On: 03/06/2019 17:57    ASSESSMENT AND PLAN: This is a very pleasant 77 years old white male recently diagnosed with extensive stage small cell lung cancer and currently undergoing systemic chemotherapy with carboplatin, etoposide and Tecentriq status post 5 cycles. He is also status post 11 cycle of maintenance treatment with single agent Tecentriq.   The patient continues to tolerate his treatment well with no concerning adverse effects. I recommended for him to proceed with cycle #12 of his maintenance therapy today as planned. I will see him back for follow-up visit in 3 weeks for evaluation before the next cycle of his treatment. The patient was advised to call immediately if she has any concerning symptoms in the interval. The patient voices understanding of current disease status and treatment options and is in agreement with the current care plan.  All questions were answered. The patient knows to call the clinic with any problems, questions or concerns. We can certainly see the patient much sooner if necessary.  Disclaimer: This note was dictated with voice recognition software. Similar sounding words can inadvertently be transcribed and may  not be corrected upon review.

## 2019-03-30 NOTE — Progress Notes (Signed)
Ok to treat today with abnormal CMP per MD Julien Nordmann.

## 2019-03-31 ENCOUNTER — Telehealth: Payer: Self-pay | Admitting: Internal Medicine

## 2019-03-31 NOTE — Telephone Encounter (Signed)
Scheduled appt per 6/1 los -  Added additional cycles per 6/1 los  -

## 2019-04-20 ENCOUNTER — Inpatient Hospital Stay: Payer: Medicare HMO

## 2019-04-20 ENCOUNTER — Other Ambulatory Visit: Payer: Self-pay

## 2019-04-20 ENCOUNTER — Encounter: Payer: Self-pay | Admitting: Physician Assistant

## 2019-04-20 ENCOUNTER — Inpatient Hospital Stay (HOSPITAL_BASED_OUTPATIENT_CLINIC_OR_DEPARTMENT_OTHER): Payer: Medicare HMO | Admitting: Physician Assistant

## 2019-04-20 VITALS — BP 117/76 | HR 71 | Temp 99.2°F | Resp 18 | Ht 70.0 in | Wt 158.6 lb

## 2019-04-20 DIAGNOSIS — J910 Malignant pleural effusion: Secondary | ICD-10-CM

## 2019-04-20 DIAGNOSIS — C342 Malignant neoplasm of middle lobe, bronchus or lung: Secondary | ICD-10-CM

## 2019-04-20 DIAGNOSIS — C3411 Malignant neoplasm of upper lobe, right bronchus or lung: Secondary | ICD-10-CM

## 2019-04-20 DIAGNOSIS — Z7901 Long term (current) use of anticoagulants: Secondary | ICD-10-CM | POA: Diagnosis not present

## 2019-04-20 DIAGNOSIS — Z5112 Encounter for antineoplastic immunotherapy: Secondary | ICD-10-CM

## 2019-04-20 DIAGNOSIS — N183 Chronic kidney disease, stage 3 (moderate): Secondary | ICD-10-CM

## 2019-04-20 DIAGNOSIS — I129 Hypertensive chronic kidney disease with stage 1 through stage 4 chronic kidney disease, or unspecified chronic kidney disease: Secondary | ICD-10-CM

## 2019-04-20 DIAGNOSIS — E039 Hypothyroidism, unspecified: Secondary | ICD-10-CM | POA: Diagnosis not present

## 2019-04-20 DIAGNOSIS — Z79899 Other long term (current) drug therapy: Secondary | ICD-10-CM | POA: Diagnosis not present

## 2019-04-20 LAB — CBC WITH DIFFERENTIAL (CANCER CENTER ONLY)
Abs Immature Granulocytes: 0.02 10*3/uL (ref 0.00–0.07)
Basophils Absolute: 0.1 10*3/uL (ref 0.0–0.1)
Basophils Relative: 1 %
Eosinophils Absolute: 0.1 10*3/uL (ref 0.0–0.5)
Eosinophils Relative: 2 %
HCT: 42.2 % (ref 39.0–52.0)
Hemoglobin: 13.8 g/dL (ref 13.0–17.0)
Immature Granulocytes: 0 %
Lymphocytes Relative: 16 %
Lymphs Abs: 1.1 10*3/uL (ref 0.7–4.0)
MCH: 31.9 pg (ref 26.0–34.0)
MCHC: 32.7 g/dL (ref 30.0–36.0)
MCV: 97.5 fL (ref 80.0–100.0)
Monocytes Absolute: 0.5 10*3/uL (ref 0.1–1.0)
Monocytes Relative: 7 %
Neutro Abs: 5 10*3/uL (ref 1.7–7.7)
Neutrophils Relative %: 74 %
Platelet Count: 171 10*3/uL (ref 150–400)
RBC: 4.33 MIL/uL (ref 4.22–5.81)
RDW: 14.3 % (ref 11.5–15.5)
WBC Count: 6.9 10*3/uL (ref 4.0–10.5)
nRBC: 0 % (ref 0.0–0.2)

## 2019-04-20 LAB — CMP (CANCER CENTER ONLY)
ALT: 9 U/L (ref 0–44)
AST: 14 U/L — ABNORMAL LOW (ref 15–41)
Albumin: 3.5 g/dL (ref 3.5–5.0)
Alkaline Phosphatase: 75 U/L (ref 38–126)
Anion gap: 10 (ref 5–15)
BUN: 23 mg/dL (ref 8–23)
CO2: 21 mmol/L — ABNORMAL LOW (ref 22–32)
Calcium: 9.9 mg/dL (ref 8.9–10.3)
Chloride: 107 mmol/L (ref 98–111)
Creatinine: 2.49 mg/dL — ABNORMAL HIGH (ref 0.61–1.24)
GFR, Est AFR Am: 28 mL/min — ABNORMAL LOW (ref >60)
GFR, Estimated: 24 mL/min — ABNORMAL LOW (ref >60)
Glucose, Bld: 101 mg/dL — ABNORMAL HIGH (ref 70–99)
Potassium: 4.7 mmol/L (ref 3.5–5.1)
Sodium: 138 mmol/L (ref 135–145)
Total Bilirubin: 0.3 mg/dL (ref 0.3–1.2)
Total Protein: 7.2 g/dL (ref 6.5–8.1)

## 2019-04-20 LAB — TSH: TSH: 54.166 u[IU]/mL — ABNORMAL HIGH (ref 0.320–4.118)

## 2019-04-20 MED ORDER — SODIUM CHLORIDE 0.9 % IV SOLN
Freq: Once | INTRAVENOUS | Status: AC
  Administered 2019-04-20: 15:00:00 via INTRAVENOUS
  Filled 2019-04-20: qty 250

## 2019-04-20 MED ORDER — SODIUM CHLORIDE 0.9 % IV SOLN
1200.0000 mg | Freq: Once | INTRAVENOUS | Status: AC
  Administered 2019-04-20: 1200 mg via INTRAVENOUS
  Filled 2019-04-20: qty 20

## 2019-04-20 NOTE — Progress Notes (Signed)
[place] OFFICE PROGRESS NOTE  Patient, No Pcp Per No address on file  DIAGNOSIS: Extensive stage (T4, N2, M1a) small cell lung cancer presented with large right perihilar mass with mediastinal invasion and loculated malignant right pleural effusion diagnosed in June 2019.  PRIOR THERAPY: None   CURRENT THERAPY: Systemic chemotherapy with carboplatin for AUC of 5 on day 1, etoposide 100 mg/M2 on days 1, 2 and 3 in addition to Tecentriq 1200 mg IV every 3 weeks with Neulasta support.  Status post 17 cycles.  Starting from cycle #6 the patient will be treated with maintenance single agent Tecentriq every 3 weeks  INTERVAL HISTORY: Christopher Wilson 78 y.o. male returns to the clinic for a follow-up visit.  The patient is feeling well today without any concerning complaints.  He denies any fever, chills, night sweats, or weight loss.  He denies any chest pain, shortness of breath, cough, or hemoptysis.  He denies any nausea, vomiting, diarrhea, constipation.  He denies any headache or visual changes.  He denies any rashes or skin changes. He continues to tolerate his treatment well without any adverse effects.  He is here today for evaluation before starting cycle #18.  MEDICAL HISTORY: Past Medical History:  Diagnosis Date  . ACS (acute coronary syndrome), with ST elevation but stable coronary arteries on cath 06/27/12 06/27/2012  . CAD (coronary artery disease), with CABG 1989 and re-do Cabg 2002 X 3 vesssels.  progressive disease with Stent to Lt. Main, and 1st diag 2006, stent to VG to LCX 2007, with known occlusion history o  06/27/2012  . CKD (chronic kidney disease) stage 3, GFR 30-59 ml/min (HCC) 06/27/2012  . 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 Coronary artery disease   . GERD (gastroesophageal reflux disease)   . Hyperlipidemia LDL goal < 70 06/27/2012  . Hypertension   . Myocardial infarction ([place])   . Peptic ulcer   . STEMI (ST elevation myocardial infarction) ([place])  06/27/2012  . Tobacco abuse 06/27/2012    ALLERGIES:  has No Known Allergies.  MEDICATIONS:  Current Outpatient Medications  Medication Sig Dispense Refill  . acetaminophen (TYLENOL) 325 MG tablet Take 650 mg by mouth every 6 (six) hours as needed for moderate pain.    Marland Kitchen albuterol (PROVENTIL HFA;VENTOLIN HFA) 108 (90 Base) MCG/ACT inhaler Inhale 2 puffs into the lungs every 6 (six) hours as needed for wheezing or shortness of breath.    Marland Kitchen atorvastatin (LIPITOR) 80 MG tablet TAKE ONE TABLET BY MOUTH DAILY AT 6 P.M. NEEDS APPOINTMENT FOR FUTURE REFILLS. (Patient taking differently: Take 80 mg by mouth daily at 6 PM. ) 90 tablet 3  . levothyroxine (SYNTHROID) 100 MCG tablet Take 0.5 tablets (50 mcg total) by mouth daily before breakfast. 30 tablet 1  . metoprolol tartrate (LOPRESSOR) 25 MG tablet Take 1 tablet (25 mg total) by mouth 2 (two) times daily. 60 tablet 3  . rivaroxaban (XARELTO) 20 MG TABS tablet Take 1 tablet (20 mg total) by mouth daily with supper. 30 tablet 1  . isosorbide mononitrate (IMDUR) 30 MG 24 hr tablet Take 1 tablet (30 mg total) by mouth daily. 90 tablet 3  . nitroGLYCERIN (NITROSTAT) 0.4 MG SL tablet Place 1 tablet (0.4 mg total) under the tongue every 5 (five) minutes as needed for chest pain. (Patient not taking: Reported on 12/15/2018) 25 tablet 3  . prochlorperazine (COMPAZINE) 10 MG tablet Take 1 tablet (10 mg total) by mouth every 6 (six) hours as needed for  nausea or vomiting. (Patient not taking: Reported on 12/15/2018) 30 tablet 0   No current facility-administered medications for this visit.    Facility-Administered Medications Ordered in Other Visits  Medication Dose Route Frequency Provider Last Rate Last Dose  . atezolizumab (TECENTRIQ) 1,200 mg in sodium chloride 0.9 % 250 mL chemo infusion  1,200 mg Intravenous Once Curt Bears, MD        SURGICAL HISTORY:  Past Surgical History:  Procedure Laterality Date  . coratid arterty surgery    . CORONARY  ANGIOPLASTY WITH STENT PLACEMENT    . CORONARY ARTERY BYPASS GRAFT    . IR GUIDED DRAIN W CATHETER PLACEMENT  04/08/2018  . IR REMOVAL OF PLURAL CATH W/CUFF  06/19/2018  . LCEA    . LEFT HEART CATH N/A 06/27/2012   Procedure: LEFT HEART CATH;  Surgeon: Troy Sine, MD;  Location[facility] CATH LAB;  Service: Cardiovascular;  Laterality: N/A;  . triple bipass     Hx of CABG and RE do  . VASCULAR SURGERY    . VIDEO BRONCHOSCOPY Bilateral 04/07/2018   Procedure: VIDEO BRONCHOSCOPY WITHOUT FLUORO;  Surgeon: Rush Farmer, MD;  Location: [facility] ENDOSCOPY;  Service: Cardiopulmonary;  Laterality: Bilateral;    REVIEW OF SYSTEMS:   Review of Systems  Constitutional: Negative for appetite change, chills, fatigue, fever and unexpected weight change.  HENT:   Negative for mouth sores, nosebleeds, sore throat and trouble swallowing.   Eyes: Negative for eye problems and icterus.  Respiratory: Negative for cough, hemoptysis, shortness of breath and wheezing.   Cardiovascular: Negative for chest pain and leg swelling.  Gastrointestinal: Negative for abdominal pain, constipation, diarrhea, nausea and vomiting.  Genitourinary: Negative for bladder incontinence, difficulty urinating, dysuria, frequency and hematuria.   Musculoskeletal: Negative for back pain, gait problem, neck pain and neck stiffness.  Skin: Negative for itching and rash.  Neurological: Negative for dizziness, extremity weakness, gait problem, headaches, light-headedness and seizures.  Hematological: Negative for adenopathy. Does not bruise/bleed easily.  Psychiatric/Behavioral: Negative for confusion, depression and sleep disturbance. The patient is not nervous/anxious.     PHYSICAL EXAMINATION:  Blood pressure 117/76, pulse 71, temperature 99.2 F (37.3 C), temperature source Oral, resp. rate 18, height 5\' 10"  (1.778 m), weight 158 lb 9.6 oz (71.9 kg), SpO2 99 %.  ECOG PERFORMANCE STATUS: 1 - Symptomatic but completely  ambulatory  Physical Exam  Constitutional: Oriented to person, place, and time and well-developed, well-nourished, and in no distress.   HENT:  Head: Normocephalic and atraumatic.  Mouth/Throat: Oropharynx is clear and moist. No oropharyngeal exudate.  Eyes: Conjunctivae are normal. Right eye exhibits no discharge. Left eye exhibits no discharge. No scleral icterus.  Neck: Normal range of motion. Neck supple.  Cardiovascular: Normal rate, regular rhythm, normal heart sounds and intact distal pulses.   Pulmonary/Chest: Quiet breath sounds bilaterally. Effort normal. No respiratory distress. No wheezes. No rales.  Abdominal: Soft. Bowel sounds are normal. Exhibits no distension and no mass. There is no tenderness.  Musculoskeletal: Normal range of motion. Exhibits no edema.  Lymphadenopathy:    No cervical adenopathy.  Neurological: Alert and oriented to person, place, and time. Exhibits normal muscle tone. Gait normal. Coordination normal.  Skin: Skin is warm and dry. No rash noted. Not diaphoretic. No erythema. No pallor.  Psychiatric: Mood, memory and judgment normal.  Vitals reviewed.  LABORATORY DATA: Lab Results  Component Value Date   WBC 6.9 04/20/2019   HGB 13.8 04/20/2019   HCT 42.2 04/20/2019  MCV 97.5 04/20/2019   PLT 171 04/20/2019      Chemistry      Component Value Date/Time   NA 138 04/20/2019 1316   NA 140 03/21/2018 1048   K 4.7 04/20/2019 1316   CL 107 04/20/2019 1316   CO2 21 (L) 04/20/2019 1316   BUN 23 04/20/2019 1316   BUN 45 (H) 03/21/2018 1048   CREATININE 2.49 (H) 04/20/2019 1316   CREATININE 1.80 (H) 09/06/2014 1033      Component Value Date/Time   CALCIUM 9.9 04/20/2019 1316   ALKPHOS 75 04/20/2019 1316   AST 14 (L) 04/20/2019 1316   ALT 9 04/20/2019 1316   BILITOT 0.3 04/20/2019 1316       RADIOGRAPHIC STUDIES:  No results found.   ASSESSMENT/PLAN:  This is a very pleasant 78 year old Caucasian male diagnosed with extensive stage  small cell lung cancer.  He presented with a large perihilar mass with mediastinal invasion and a loculated malignant pleural effusion.  He was diagnosed in June 2019.  He is currently undergoing systemic chemotherapy with carboplatin, etoposide, and Tecentriq.  Starting from cycle #5, the patient has been receiving maintenance Tecentriq 1200 mg IV every 3 weeks.  He is status post 17 cycles.  He has been tolerating treatment well without any adverse effects.  The patient was seen with Dr. Julien Nordmann today.  Labs were reviewed with the patient.  We recommend that he proceed with cycle #18 today scheduled.  I will arrange for a restaging CT scan to be performed prior to the next visit.  The patient will have the scan performed without contrast due to his renal insufficiency as well as his intolerability of drinking the oral contrast.  I will see the patient back for a follow-up visit in 3 weeks for evaluation and to review the scan results before starting cycle #19.  The patient's TSH continues to be elevated at 54.166 today.  The patient's dose of Synthroid was increased from 50 mcg to 100 mcg in April 2020.  I will send a prescription for 125 mcg Synthroid to the patient's pharmacy.  We will monitor his TSH every 3 weeks on routine labs.  The patient was advised to call immediately if he has any concerning symptoms in the interval. The patient voices understanding of current disease status and treatment options and is in agreement with the current care plan. All questions were answered. The patient knows to call the clinic with any problems, questions or concerns. We can certainly see the patient much sooner if necessary   Orders Placed This Encounter  Procedures  . CT Chest Wo Contrast    Standing Status:   Future    Standing Expiration Date:   04/19/2020    Order Specific Question:   ** REASON FOR EXAM (FREE TEXT)    Answer:   Restaging Lung Cancer    Order Specific Question:   Preferred  imaging location?    Answer:   [facility]    Order Specific Question:   Radiology Contrast Protocol - do NOT remove file path    Answer:   \\[email or web address]  . CT Abdomen Wo Contrast    Standing Status:   Future    Standing Expiration Date:   04/19/2020    Order Specific Question:   ** REASON FOR EXAM (FREE TEXT)    Answer:   restaging Lung cancer    Order Specific Question:   Preferred imaging location?    Answer:   [place]  [facility]    Order Specific Question:   Is Oral Contrast requested for this exam?    Answer:   Yes, Per Radiology protocol    Order Specific Question:   Radiology Contrast Protocol - do NOT remove file path    Answer:   \\[email or web address]  . CBC with Differential ([place] Only)    Standing Status:   Standing    Number of Occurrences:   10    Standing Expiration Date:   04/19/2020  . CMP ([place] only)    Standing Status:   Standing    Number of Occurrences:   10    Standing Expiration Date:   04/19/2020  . TSH    Standing Status:   Standing    Number of Occurrences:   10    Standing Expiration Date:   04/19/2020     Tobe Sos Halei Hanover, PA-C 04/20/19  ADDENDUM: Hematology/Oncology Attending: I had a face-to-face encounter with the patient today.  I recommended his care plan.  This is a very pleasant 78 years old white male with extensive stage small cell lung cancer status post induction chemotherapy with carboplatin, etoposide and Tecentriq 5 cycles and he is currently on maintenance treatment with Tecentriq status post 12 cycles. The patient continues to tolerate this treatment well with no concerning adverse effects. I recommended for him to proceed with cycle #13 today as planned. We will see him back for follow-up visit in 3 weeks for evaluation after repeating CT scan of the chest, abdomen and pelvis for restaging of his disease. For the hypothyroidism, we will adjust his  levothyroxine dose to 125 mcg p.o. daily. The patient was advised to call immediately if he has any concerning symptoms in the interval.  Disclaimer: This note was dictated with voice recognition software. Similar sounding words can inadvertently be transcribed and may be missed upon review. Eilleen Kempf, MD 04/20/19

## 2019-04-20 NOTE — Patient Instructions (Signed)
[facility] [place] Discharge Instructions for Patients Receiving Chemotherapy  Today you received the following chemotherapy agent: Tecentriq.  To help prevent nausea and vomiting after your treatment, we encourage you to take your nausea medication as directed   If you develop nausea and vomiting that is not controlled by your nausea medication, call the clinic.   BELOW ARE SYMPTOMS THAT SHOULD BE REPORTED IMMEDIATELY:  *FEVER GREATER THAN 100.5 F  *CHILLS WITH OR WITHOUT FEVER  NAUSEA AND VOMITING THAT IS NOT CONTROLLED WITH YOUR NAUSEA MEDICATION  *UNUSUAL SHORTNESS OF BREATH  *UNUSUAL BRUISING OR BLEEDING  TENDERNESS IN MOUTH AND THROAT WITH OR WITHOUT PRESENCE OF ULCERS  *URINARY PROBLEMS  *BOWEL PROBLEMS  UNUSUAL RASH Items with * indicate a potential emergency and should be followed up as soon as possible.  Feel free to call the clinic should you have any questions or concerns. The clinic phone number is [phone].  Please show the [facility] at check-in to the Emergency Department and triage nurse.

## 2019-04-21 ENCOUNTER — Telehealth: Payer: Self-pay | Admitting: Physician Assistant

## 2019-04-21 NOTE — Telephone Encounter (Signed)
Scheduled appt per 6/22 los.

## 2019-04-28 ENCOUNTER — Other Ambulatory Visit: Payer: Self-pay | Admitting: Cardiovascular Disease

## 2019-04-29 ENCOUNTER — Other Ambulatory Visit: Payer: Self-pay | Admitting: *Deleted

## 2019-04-29 DIAGNOSIS — R7989 Other specified abnormal findings of blood chemistry: Secondary | ICD-10-CM

## 2019-04-29 MED ORDER — LEVOTHYROXINE SODIUM 125 MCG PO TABS: 125.0000 ug | ORAL_TABLET | Freq: Every day | ORAL | 0 refills | Status: DC

## 2019-04-29 NOTE — Telephone Encounter (Signed)
Call from Pt who is requesting synthroid rx refill sent to [facility]. Reviewed MD last progress note 6/22, TSH elevated, pt synthroid to be increased to 180mcg 1 Tablet PO Daily.

## 2019-05-07 ENCOUNTER — Ambulatory Visit (HOSPITAL_COMMUNITY): Admission: RE | Admit: 2019-05-07 | Payer: Medicare HMO | Source: Ambulatory Visit

## 2019-05-07 IMAGING — DX DG CHEST 1V PORT
2 series · 2 of 2 positions shown · non-contrast
Comparison: Chest x-rays dated 04/06/2018 in 04/05/2018.

CLINICAL DATA: Pleural effusion.  Status post bronchoscopy.

EXAM:
PORTABLE CHEST 1 VIEW

[chest ap (1 of 2)]
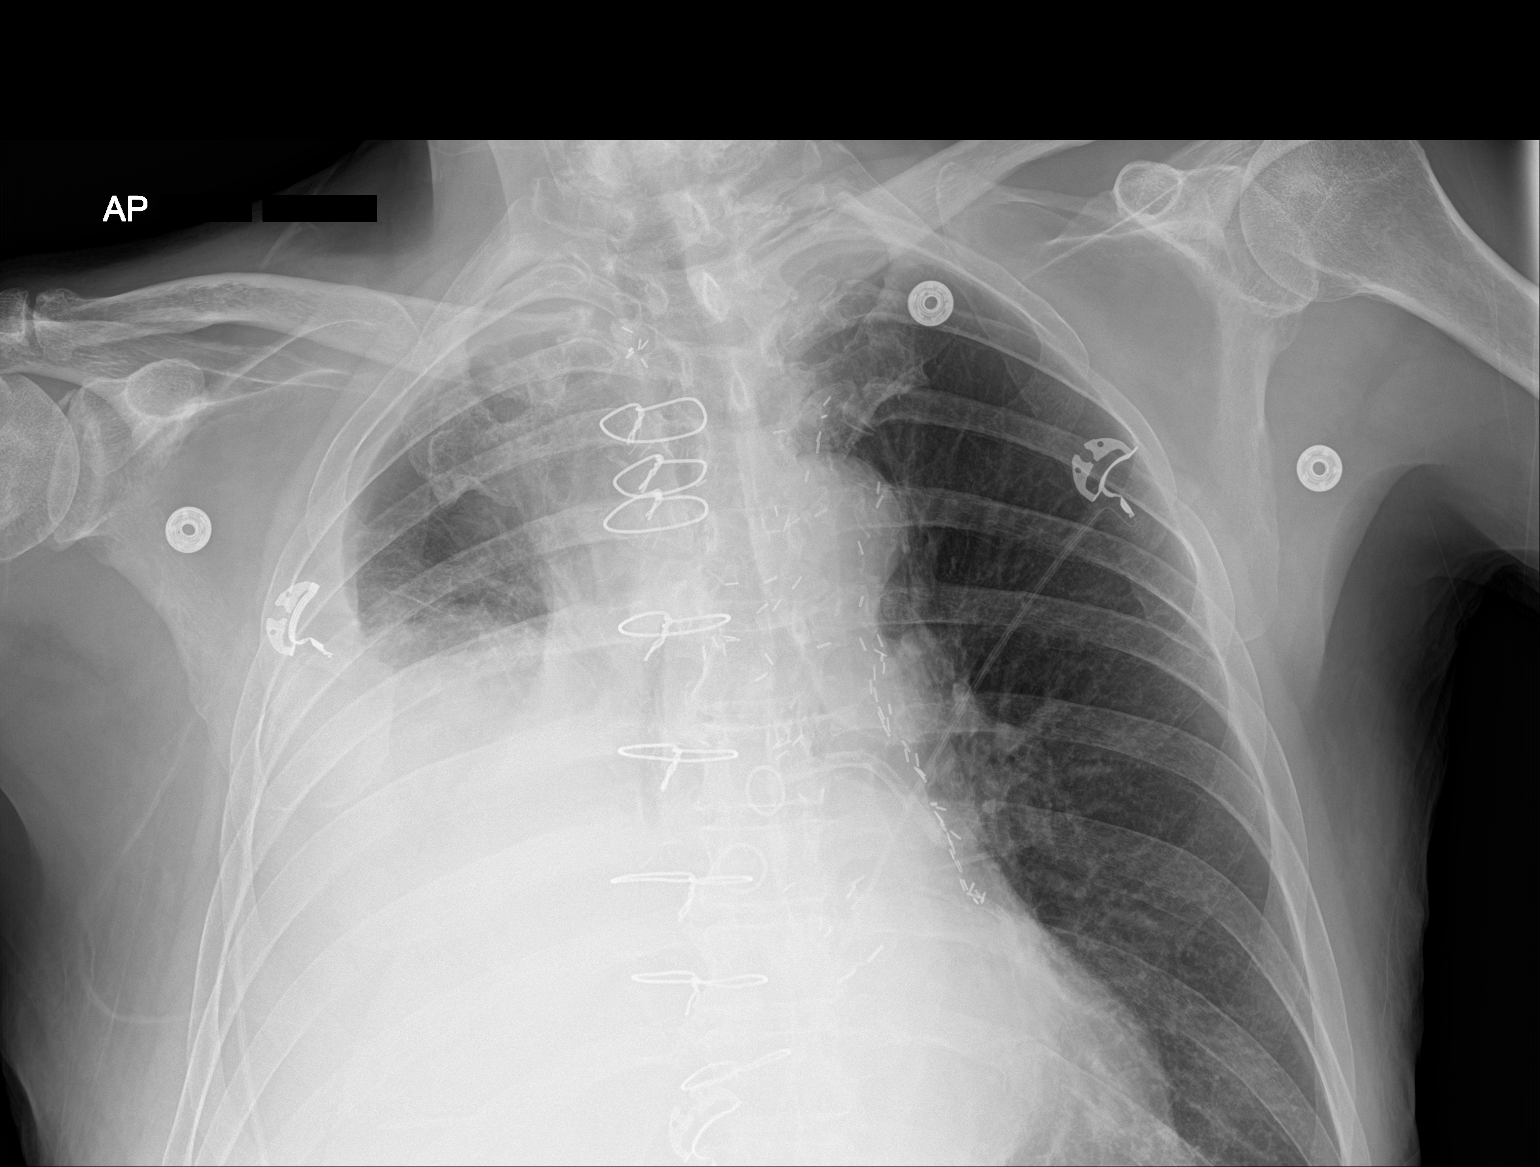

[chest ap (2 of 2)]
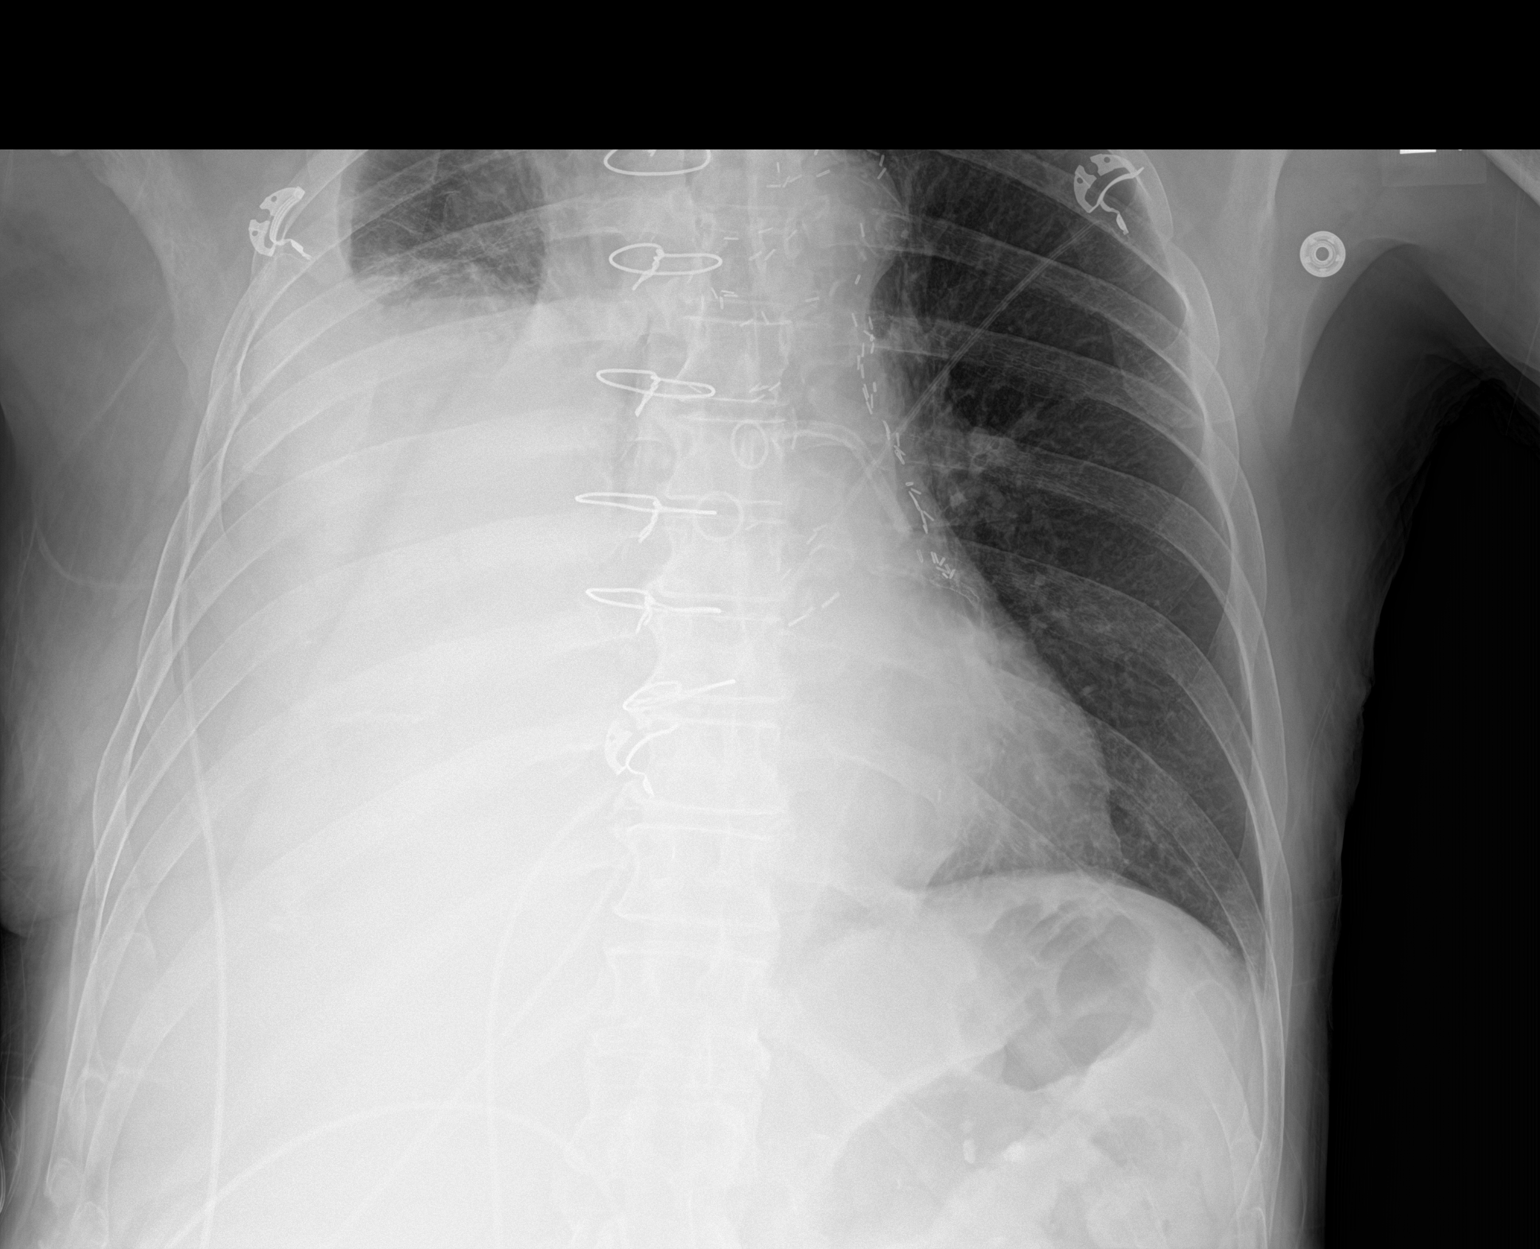

[2 of 2 positions shown; findings below may reference images not displayed]

FINDINGS: Large opacity of the RIGHT hemithorax is unchanged, corresponding to
the large pleural effusion demonstrated on chest CT of 03/24/2018.
LEFT lung is clear. No pneumothorax seen. Cardiomediastinal
silhouette is grossly stable. Median sternotomy wires in place. No
acute or suspicious osseous finding.
IMPRESSION: Large RIGHT pleural effusion is stable. No procedural complicating
feature identified, status post bronchoscopy.

## 2019-05-08 ENCOUNTER — Ambulatory Visit (HOSPITAL_COMMUNITY)
Admission: RE | Admit: 2019-05-08 | Discharge: 2019-05-08 | Disposition: A | Discharge status: Home / Self Care | Payer: Medicare HMO | Source: Ambulatory Visit | Attending: Physician Assistant | Admitting: Physician Assistant

## 2019-05-08 ENCOUNTER — Ambulatory Visit (HOSPITAL_COMMUNITY): Payer: Medicare HMO

## 2019-05-08 ENCOUNTER — Other Ambulatory Visit: Payer: Self-pay

## 2019-05-08 DIAGNOSIS — C3491 Malignant neoplasm of unspecified part of right bronchus or lung: Secondary | ICD-10-CM | POA: Diagnosis not present

## 2019-05-08 DIAGNOSIS — C3411 Malignant neoplasm of upper lobe, right bronchus or lung: Secondary | ICD-10-CM | POA: Diagnosis not present

## 2019-05-11 ENCOUNTER — Inpatient Hospital Stay: Payer: Medicare HMO

## 2019-05-11 ENCOUNTER — Inpatient Hospital Stay: Payer: Medicare HMO | Attending: Internal Medicine

## 2019-05-11 ENCOUNTER — Encounter: Payer: Self-pay | Admitting: Internal Medicine

## 2019-05-11 ENCOUNTER — Inpatient Hospital Stay (HOSPITAL_BASED_OUTPATIENT_CLINIC_OR_DEPARTMENT_OTHER): Payer: Medicare HMO | Admitting: Internal Medicine

## 2019-05-11 ENCOUNTER — Telehealth (HOSPITAL_COMMUNITY): Payer: Self-pay

## 2019-05-11 ENCOUNTER — Other Ambulatory Visit: Payer: Self-pay | Admitting: Cardiovascular Disease

## 2019-05-11 ENCOUNTER — Other Ambulatory Visit: Payer: Self-pay

## 2019-05-11 VITALS — BP 105/77 | HR 70 | Temp 99.1°F | Resp 21 | Wt 157.8 lb

## 2019-05-11 DIAGNOSIS — Z720 Tobacco use: Secondary | ICD-10-CM

## 2019-05-11 DIAGNOSIS — Z7901 Long term (current) use of anticoagulants: Secondary | ICD-10-CM | POA: Diagnosis not present

## 2019-05-11 DIAGNOSIS — N184 Chronic kidney disease, stage 4 (severe): Secondary | ICD-10-CM | POA: Insufficient documentation

## 2019-05-11 DIAGNOSIS — I129 Hypertensive chronic kidney disease with stage 1 through stage 4 chronic kidney disease, or unspecified chronic kidney disease: Secondary | ICD-10-CM | POA: Insufficient documentation

## 2019-05-11 DIAGNOSIS — E039 Hypothyroidism, unspecified: Secondary | ICD-10-CM | POA: Insufficient documentation

## 2019-05-11 DIAGNOSIS — C342 Malignant neoplasm of middle lobe, bronchus or lung: Secondary | ICD-10-CM

## 2019-05-11 DIAGNOSIS — Z5112 Encounter for antineoplastic immunotherapy: Secondary | ICD-10-CM | POA: Diagnosis not present

## 2019-05-11 DIAGNOSIS — C3411 Malignant neoplasm of upper lobe, right bronchus or lung: Secondary | ICD-10-CM

## 2019-05-11 DIAGNOSIS — I700 Atherosclerosis of aorta: Secondary | ICD-10-CM | POA: Diagnosis not present

## 2019-05-11 DIAGNOSIS — Z79899 Other long term (current) drug therapy: Secondary | ICD-10-CM | POA: Insufficient documentation

## 2019-05-11 DIAGNOSIS — J439 Emphysema, unspecified: Secondary | ICD-10-CM | POA: Diagnosis not present

## 2019-05-11 DIAGNOSIS — J910 Malignant pleural effusion: Secondary | ICD-10-CM | POA: Diagnosis not present

## 2019-05-11 LAB — CBC WITH DIFFERENTIAL (CANCER CENTER ONLY)
Abs Immature Granulocytes: 0.03 10*3/uL (ref 0.00–0.07)
Basophils Absolute: 0.1 10*3/uL (ref 0.0–0.1)
Basophils Relative: 1 %
Eosinophils Absolute: 0.1 10*3/uL (ref 0.0–0.5)
Eosinophils Relative: 2 %
HCT: 44.5 % (ref 39.0–52.0)
Hemoglobin: 14.2 g/dL (ref 13.0–17.0)
Immature Granulocytes: 1 %
Lymphocytes Relative: 18 %
Lymphs Abs: 0.9 10*3/uL (ref 0.7–4.0)
MCH: 31.7 pg (ref 26.0–34.0)
MCHC: 31.9 g/dL (ref 30.0–36.0)
MCV: 99.3 fL (ref 80.0–100.0)
Monocytes Absolute: 0.5 10*3/uL (ref 0.1–1.0)
Monocytes Relative: 9 %
Neutro Abs: 3.4 10*3/uL (ref 1.7–7.7)
Neutrophils Relative %: 69 %
Platelet Count: 199 10*3/uL (ref 150–400)
RBC: 4.48 MIL/uL (ref 4.22–5.81)
RDW: 14.3 % (ref 11.5–15.5)
WBC Count: 4.9 10*3/uL (ref 4.0–10.5)
nRBC: 0 % (ref 0.0–0.2)

## 2019-05-11 LAB — CMP (CANCER CENTER ONLY)
ALT: 7 U/L (ref 0–44)
AST: 14 U/L — ABNORMAL LOW (ref 15–41)
Albumin: 3.5 g/dL (ref 3.5–5.0)
Alkaline Phosphatase: 76 U/L (ref 38–126)
Anion gap: 7 (ref 5–15)
BUN: 30 mg/dL — ABNORMAL HIGH (ref 8–23)
CO2: 26 mmol/L (ref 22–32)
Calcium: 9.5 mg/dL (ref 8.9–10.3)
Chloride: 106 mmol/L (ref 98–111)
Creatinine: 2.67 mg/dL — ABNORMAL HIGH (ref 0.61–1.24)
GFR, Est AFR Am: 26 mL/min — ABNORMAL LOW (ref >60)
GFR, Estimated: 22 mL/min — ABNORMAL LOW (ref >60)
Glucose, Bld: 102 mg/dL — ABNORMAL HIGH (ref 70–99)
Potassium: 5.1 mmol/L (ref 3.5–5.1)
Sodium: 139 mmol/L (ref 135–145)
Total Bilirubin: 0.5 mg/dL (ref 0.3–1.2)
Total Protein: 7.3 g/dL (ref 6.5–8.1)

## 2019-05-11 LAB — TSH: TSH: 49.313 u[IU]/mL — ABNORMAL HIGH (ref 0.320–4.118)

## 2019-05-11 MED ORDER — SODIUM CHLORIDE 0.9 % IV SOLN
1200.0000 mg | Freq: Once | INTRAVENOUS | Status: AC
  Administered 2019-05-11: 1200 mg via INTRAVENOUS
  Filled 2019-05-11: qty 20

## 2019-05-11 MED ORDER — SODIUM CHLORIDE 0.9 % IV SOLN
Freq: Once | INTRAVENOUS | Status: AC
  Administered 2019-05-11: 14:00:00 via INTRAVENOUS
  Filled 2019-05-11: qty 250

## 2019-05-11 NOTE — Patient Instructions (Signed)
[facility] Discharge Instructions for Patients Receiving Chemotherapy  Today you received the following chemotherapy agents: Tecentriq  To help prevent nausea and vomiting after your treatment, we encourage you to take your nausea medication as directed.   If you develop nausea and vomiting that is not controlled by your nausea medication, call the clinic.   BELOW ARE SYMPTOMS THAT SHOULD BE REPORTED IMMEDIATELY:  *FEVER GREATER THAN 100.5 F  *CHILLS WITH OR WITHOUT FEVER  NAUSEA AND VOMITING THAT IS NOT CONTROLLED WITH YOUR NAUSEA MEDICATION  *UNUSUAL SHORTNESS OF BREATH  *UNUSUAL BRUISING OR BLEEDING  TENDERNESS IN MOUTH AND THROAT WITH OR WITHOUT PRESENCE OF ULCERS  *URINARY PROBLEMS  *BOWEL PROBLEMS  UNUSUAL RASH Items with * indicate a potential emergency and should be followed up as soon as possible.  Feel free to call the clinic should you have any questions or concerns. The clinic phone number is [phone].  Please show the CHEMO ALERT CARD at check-in to the Emergency Department and triage nurse.   

## 2019-05-11 NOTE — Progress Notes (Signed)
[place] [phone]   [phone]  OFFICE PROGRESS NOTE  Patient, No Pcp Per No address on file  DIAGNOSIS: Extensive stage (T4, N2, M1a) small cell lung cancer presented with large right perihilar mass with mediastinal invasion and loculated malignant right pleural effusion diagnosed in June 2019.  PRIOR THERAPY:None.  CURRENT THERAPY: Systemic chemotherapy with carboplatin for AUC of 5 on day 1, etoposide 100 mg/M2 on days 1, 2 and 3 in addition to Tecentriq 1200 mg IV every 3 weeks with Neulasta support.  Status post 16 cycles.  Starting from cycle #6 the patient will be treated with maintenance single agent Tecentriq every 3 weeks  INTERVAL HISTORY: Christopher Wilson 78 y.o. male returns to the clinic today for follow-up visit.  The patient is feeling fine today with no concerning complaints except for mild cough productive of whitish sputum.  He denied having any chest pain, shortness of breath or hemoptysis.  He denied having any fever or chills.  He has no nausea, vomiting, diarrhea or constipation.  He denied having any headache or visual changes.  He continues to tolerate his treatment with Tecentriq fairly well.  The patient had repeat CT scan of the chest performed recently and he is here for evaluation and discussion of his scan results.  MEDICAL HISTORY: Past Medical History:  Diagnosis Date  . ACS (acute coronary syndrome), with ST elevation but stable coronary arteries on cath 06/27/12 06/27/2012  . CAD (coronary artery disease), with CABG 1989 and re-do Cabg 2002 X 3 vesssels.  progressive disease with Stent to Lt. Main, and 1st diag 2006, stent to VG to LCX 2007, with known occlusion history o  06/27/2012  . CKD (chronic kidney disease) stage 3, GFR 30-59 ml/min (HCC) 06/27/2012  . 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 Coronary artery disease   . GERD (gastroesophageal reflux disease)   . Hyperlipidemia LDL goal < 70 06/27/2012  .  Hypertension   . Myocardial infarction ([place])   . Peptic ulcer   . STEMI (ST elevation myocardial infarction) ([place]) 06/27/2012  . Tobacco abuse 06/27/2012    ALLERGIES:  has No Known Allergies.  MEDICATIONS:  Current Outpatient Medications  Medication Sig Dispense Refill  . acetaminophen (TYLENOL) 325 MG tablet Take 650 mg by mouth every 6 (six) hours as needed for moderate pain.    Marland Kitchen albuterol (PROVENTIL HFA;VENTOLIN HFA) 108 (90 Base) MCG/ACT inhaler Inhale 2 puffs into the lungs every 6 (six) hours as needed for wheezing or shortness of breath.    Marland Kitchen atorvastatin (LIPITOR) 80 MG tablet TAKE ONE TABLET BY MOUTH DAILY AT 6 P.M. NEEDS APPOINTMENT FOR FUTURE REFILLS. (Patient taking differently: Take 80 mg by mouth daily at 6 PM. ) 90 tablet 3  . isosorbide mononitrate (IMDUR) 30 MG 24 hr tablet Take 1 tablet (30 mg total) by mouth daily. 90 tablet 3  . levothyroxine (SYNTHROID) 125 MCG tablet Take 1 tablet (125 mcg total) by mouth daily before breakfast. 30 tablet 0  . metoprolol tartrate (LOPRESSOR) 25 MG tablet Take 1 tablet (25 mg total) by mouth 2 (two) times daily. OV NEEDED. 60 tablet 0  . nitroGLYCERIN (NITROSTAT) 0.4 MG SL tablet Place 1 tablet (0.4 mg total) under the tongue every 5 (five) minutes as needed for chest pain. (Patient not taking: Reported on 12/15/2018) 25 tablet 3  . prochlorperazine (COMPAZINE) 10 MG tablet Take 1 tablet (10 mg total) by mouth every 6 (six) hours as needed  for nausea or vomiting. (Patient not taking: Reported on 12/15/2018) 30 tablet 0  . rivaroxaban (XARELTO) 20 MG TABS tablet Take 1 tablet (20 mg total) by mouth daily with supper. 30 tablet 1   No current facility-administered medications for this visit.     SURGICAL HISTORY:  Past Surgical History:  Procedure Laterality Date  . coratid arterty surgery    . CORONARY ANGIOPLASTY WITH STENT PLACEMENT    . CORONARY ARTERY BYPASS GRAFT    . IR GUIDED DRAIN W CATHETER PLACEMENT  04/08/2018  . IR REMOVAL  OF PLURAL CATH W/CUFF  06/19/2018  . LCEA    . LEFT HEART CATH N/A 06/27/2012   Procedure: LEFT HEART CATH;  Surgeon: Troy Sine, MD;  Location: [facility] CATH LAB;  Service: Cardiovascular;  Laterality: N/A;  . triple bipass     Hx of CABG and RE do  . VASCULAR SURGERY    . VIDEO BRONCHOSCOPY Bilateral 04/07/2018   Procedure: VIDEO BRONCHOSCOPY WITHOUT FLUORO;  Surgeon: Rush Farmer, MD;  Location: [facility] ENDOSCOPY;  Service: Cardiopulmonary;  Laterality: Bilateral;    REVIEW OF SYSTEMS:  Constitutional: negative Eyes: negative Ears, nose, mouth, throat, and face: negative Respiratory: positive for cough and sputum Cardiovascular: negative Gastrointestinal: negative Genitourinary:negative Integument/breast: negative Hematologic/lymphatic: negative Musculoskeletal:negative Neurological: negative Behavioral/Psych: negative Endocrine: negative Allergic/Immunologic: negative   PHYSICAL EXAMINATION: General appearance: alert, cooperative and no distress Head: Normocephalic, without obvious abnormality, atraumatic Neck: no adenopathy, no JVD, supple, symmetrical, trachea midline and thyroid not enlarged, symmetric, no tenderness/mass/nodules Lymph nodes: Cervical, supraclavicular, and axillary nodes normal. Resp: clear to auscultation bilaterally Back: symmetric, no curvature. ROM normal. No CVA tenderness. Cardio: regular rate and rhythm, S1, S2 normal, no murmur, click, rub or gallop GI: soft, non-tender; bowel sounds normal; no masses,  no organomegaly Extremities: extremities normal, atraumatic, no cyanosis or edema Neurologic: Alert and oriented X 3, normal strength and tone. Normal symmetric reflexes. Normal coordination and gait  ECOG PERFORMANCE STATUS: 1 - Symptomatic but completely ambulatory  Blood pressure 105/77, pulse 70, temperature 99.1 F (37.3 C), resp. rate (!) 21, weight 157 lb 12.8 oz (71.6 kg).  LABORATORY DATA: Lab Results  Component Value Date   WBC 6.9  04/20/2019   HGB 13.8 04/20/2019   HCT 42.2 04/20/2019   MCV 97.5 04/20/2019   PLT 171 04/20/2019      Chemistry      Component Value Date/Time   NA 138 04/20/2019 1316   NA 140 03/21/2018 1048   K 4.7 04/20/2019 1316   CL 107 04/20/2019 1316   CO2 21 (L) 04/20/2019 1316   BUN 23 04/20/2019 1316   BUN 45 (H) 03/21/2018 1048   CREATININE 2.49 (H) 04/20/2019 1316   CREATININE 1.80 (H) 09/06/2014 1033      Component Value Date/Time   CALCIUM 9.9 04/20/2019 1316   ALKPHOS 75 04/20/2019 1316   AST 14 (L) 04/20/2019 1316   ALT 9 04/20/2019 1316   BILITOT 0.3 04/20/2019 1316       RADIOGRAPHIC STUDIES: Ct Chest Wo Contrast  Result Date: 05/08/2019 CLINICAL DATA:  Right lung cancer, for which the patient is ongoing chemotherapy. EXAM: CT CHEST WITHOUT CONTRAST TECHNIQUE: Multidetector CT imaging of the chest was performed following the standard protocol without IV contrast. COMPARISON:  Mar 06 2019 FINDINGS: Cardiovascular: Postsurgical changes of CABG. Normal heart size. Subendocardial fat along the left ventricle, likely sequela of prior MI. No pericardial effusion. Ectatic atherosclerotic a Lortab. Calcific coronary artery disease. Mediastinum/Nodes: Stable  low right paratracheal lymph node measures 8 mm in short axis. Lungs/Pleura: Upper lobe predominant paraseptal emphysema. Stable nodular opacity in the right lung apex measures 13 mm, image 22/174, sequence 5. Stable 9 mm nodule in the posterior right lung apex, image 21/174, sequence 5. Slightly decreased in size spiculated mass in the central right upper lobe measures 3.2 x 2.9 cm, image 76/174, sequence 5, prior measurement of 3.8 x 3.0 cm. Persistent peribronchial airspace disease versus scarring in the right lower lobe. No pleural effusion or pneumothorax. Upper Abdomen: Atrophic right kidney. Left nephrolithiasis with the largest calculus measuring 8 mm. Musculoskeletal: Stigmata of diffuse idiopathic skeletal hyperostosis of the  thoracic spine. IMPRESSION: 1. Slightly decreased in size 3.2 cm central right upper lobe mass. 2. Stable pulmonary nodules in the right lung apex. 3. Stable peribronchial airspace disease versus scarring in the right lower lobe. Aortic Atherosclerosis (ICD10-I70.0) and Emphysema (ICD10-J43.9). Electronically Signed   By: Fidela Salisbury M.D.   On: 05/08/2019 17:13    ASSESSMENT AND PLAN: This is a very pleasant 79 years old white male recently diagnosed with extensive stage small cell lung cancer and currently undergoing systemic chemotherapy with carboplatin, etoposide and Tecentriq status post 5 cycles. He is also status post 12 cycle of maintenance treatment with single agent Tecentriq.   He has been tolerating this treatment well with no concerning adverse effects. The patient had repeat CT scan of the chest performed recently.  I personally and independently reviewed the scans and discussed the results with the patient today. His scan showed no concerning findings for disease progression. I recommended for the patient to continue his current treatment with Tecentriq and he will proceed with cycle #13 of the maintenance therapy today. For the history of hypertension, he will continue with his current blood pressure medication. For hypothyroidism, he will continue with levothyroxine and we will adjust his dose as needed.  We will continue to monitor TSH closely. The patient will come back for follow-up visit in 3 weeks for evaluation before the next cycle of his treatment. He was advised to call immediately if he has any concerning symptoms in the interval. The patient voices understanding of current disease status and treatment options and is in agreement with the current care plan.  All questions were answered. The patient knows to call the clinic with any problems, questions or concerns. We can certainly see the patient much sooner if necessary.  Disclaimer: This note was dictated with  voice recognition software. Similar sounding words can inadvertently be transcribed and may not be corrected upon review.

## 2019-05-11 NOTE — Progress Notes (Signed)
Per Dr. Julien Nordmann, ok to treat with elevated creatinine.

## 2019-05-12 MED ORDER — RIVAROXABAN 20 MG PO TABS: 20.0000 mg | ORAL_TABLET | Freq: Every day | ORAL | 1 refills | Status: AC

## 2019-05-12 NOTE — Telephone Encounter (Signed)
88 M, 71.6 kg, SCr 2.67.  CrCl 23.5.  On xarelto for DVT.  Mfr recommends no dose change if >/= 15 mL/min, however no clinical data < 30 mL/min.  Pt also with cancer.  Will continue current dose for now

## 2019-05-19 ENCOUNTER — Telehealth: Payer: Self-pay | Admitting: Medical Oncology

## 2019-05-19 ENCOUNTER — Other Ambulatory Visit: Payer: Self-pay | Admitting: Medical Oncology

## 2019-05-19 NOTE — Telephone Encounter (Signed)
Received call back from patient to acknowledge that CT abd/pelvis was cancelled.  No other questions or concerns

## 2019-05-19 NOTE — Telephone Encounter (Signed)
Lvm that Ct abd is cancelled.

## 2019-05-29 ENCOUNTER — Telehealth: Payer: Self-pay | Admitting: *Deleted

## 2019-05-29 NOTE — Telephone Encounter (Signed)
Daughter is requesting to come in with patient for his appt on Monday as she has concerns about her father. Discussed visitor policy.  She is concerned that the cancer has gone to his brain. States he is forgetting what day it is and when family members leave to go to work he will ask later where they are. This is new behavior for him. Daughter would like to be called during his visit if possible.

## 2019-06-01 ENCOUNTER — Inpatient Hospital Stay: Payer: Medicare HMO

## 2019-06-01 ENCOUNTER — Encounter: Payer: Self-pay | Admitting: Physician Assistant

## 2019-06-01 ENCOUNTER — Inpatient Hospital Stay: Payer: Medicare HMO | Attending: Internal Medicine

## 2019-06-01 ENCOUNTER — Other Ambulatory Visit: Payer: Self-pay

## 2019-06-01 ENCOUNTER — Inpatient Hospital Stay (HOSPITAL_BASED_OUTPATIENT_CLINIC_OR_DEPARTMENT_OTHER): Payer: Medicare HMO | Admitting: Physician Assistant

## 2019-06-01 VITALS — BP 153/92 | HR 87 | Temp 97.8°F | Resp 14 | Ht 70.0 in | Wt 157.4 lb

## 2019-06-01 DIAGNOSIS — N183 Chronic kidney disease, stage 3 (moderate): Secondary | ICD-10-CM | POA: Insufficient documentation

## 2019-06-01 DIAGNOSIS — C3411 Malignant neoplasm of upper lobe, right bronchus or lung: Secondary | ICD-10-CM

## 2019-06-01 DIAGNOSIS — I129 Hypertensive chronic kidney disease with stage 1 through stage 4 chronic kidney disease, or unspecified chronic kidney disease: Secondary | ICD-10-CM | POA: Diagnosis not present

## 2019-06-01 DIAGNOSIS — C342 Malignant neoplasm of middle lobe, bronchus or lung: Secondary | ICD-10-CM | POA: Diagnosis not present

## 2019-06-01 DIAGNOSIS — Z7901 Long term (current) use of anticoagulants: Secondary | ICD-10-CM | POA: Insufficient documentation

## 2019-06-01 DIAGNOSIS — I252 Old myocardial infarction: Secondary | ICD-10-CM | POA: Diagnosis not present

## 2019-06-01 DIAGNOSIS — Z79899 Other long term (current) drug therapy: Secondary | ICD-10-CM | POA: Diagnosis not present

## 2019-06-01 DIAGNOSIS — Z5112 Encounter for antineoplastic immunotherapy: Secondary | ICD-10-CM | POA: Insufficient documentation

## 2019-06-01 DIAGNOSIS — E039 Hypothyroidism, unspecified: Secondary | ICD-10-CM | POA: Diagnosis not present

## 2019-06-01 LAB — CMP (CANCER CENTER ONLY)
ALT: 9 U/L (ref 0–44)
AST: 16 U/L (ref 15–41)
Albumin: 3.3 g/dL — ABNORMAL LOW (ref 3.5–5.0)
Alkaline Phosphatase: 71 U/L (ref 38–126)
Anion gap: 8 (ref 5–15)
BUN: 29 mg/dL — ABNORMAL HIGH (ref 8–23)
CO2: 22 mmol/L (ref 22–32)
Calcium: 9.8 mg/dL (ref 8.9–10.3)
Chloride: 107 mmol/L (ref 98–111)
Creatinine: 2.47 mg/dL — ABNORMAL HIGH (ref 0.61–1.24)
GFR, Est AFR Am: 28 mL/min — ABNORMAL LOW (ref >60)
GFR, Estimated: 24 mL/min — ABNORMAL LOW (ref >60)
Glucose, Bld: 91 mg/dL (ref 70–99)
Potassium: 4.9 mmol/L (ref 3.5–5.1)
Sodium: 137 mmol/L (ref 135–145)
Total Bilirubin: 0.3 mg/dL (ref 0.3–1.2)
Total Protein: 7.1 g/dL (ref 6.5–8.1)

## 2019-06-01 LAB — CBC WITH DIFFERENTIAL (CANCER CENTER ONLY)
Abs Immature Granulocytes: 0.02 10*3/uL (ref 0.00–0.07)
Basophils Absolute: 0.1 10*3/uL (ref 0.0–0.1)
Basophils Relative: 1 %
Eosinophils Absolute: 0.1 10*3/uL (ref 0.0–0.5)
Eosinophils Relative: 1 %
HCT: 44.2 % (ref 39.0–52.0)
Hemoglobin: 14.2 g/dL (ref 13.0–17.0)
Immature Granulocytes: 0 %
Lymphocytes Relative: 17 %
Lymphs Abs: 1.1 10*3/uL (ref 0.7–4.0)
MCH: 32.1 pg (ref 26.0–34.0)
MCHC: 32.1 g/dL (ref 30.0–36.0)
MCV: 99.8 fL (ref 80.0–100.0)
Monocytes Absolute: 0.5 10*3/uL (ref 0.1–1.0)
Monocytes Relative: 8 %
Neutro Abs: 5 10*3/uL (ref 1.7–7.7)
Neutrophils Relative %: 73 %
Platelet Count: 191 10*3/uL (ref 150–400)
RBC: 4.43 MIL/uL (ref 4.22–5.81)
RDW: 14.2 % (ref 11.5–15.5)
WBC Count: 6.8 10*3/uL (ref 4.0–10.5)
nRBC: 0 % (ref 0.0–0.2)

## 2019-06-01 LAB — TSH: TSH: 74.206 u[IU]/mL — ABNORMAL HIGH (ref 0.320–4.118)

## 2019-06-01 IMAGING — MR MR HEAD W/O CM
10 of 11 series · 42 of 48 positions shown · non-contrast
Comparison: None.

CLINICAL DATA: Recent diagnosis of small cell lung cancer. Staging.

EXAM:
MRI HEAD WITHOUT CONTRAST
TECHNIQUE: Multiplanar, multiecho pulse sequences of the brain and surrounding
structures were obtained without intravenous contrast.

[Series 5: ax dwi_tracew · axial · 3.0mm · 1.50mm/px · z∈[-47,+85]mm · 8 of 80 slices shown]
[im 1/80]
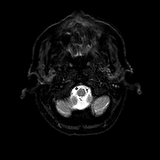
[im 12/80]
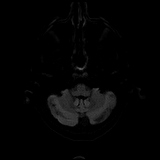
[im 23/80]
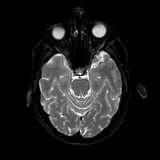
[im 34/80]
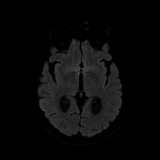
[im 46/80]
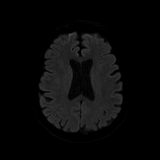
[im 57/80]
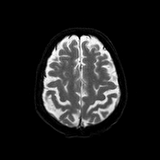
[im 68/80]
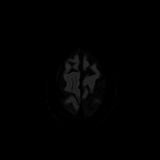
[im 80/80]
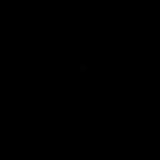

[Series 6: ax dwi_adc · axial · 3.0mm · 1.50mm/px · z∈[-47,+82]mm · 4 of 39 slices shown]
[im 1/39]
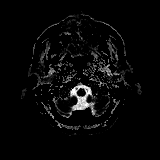
[im 13/39]
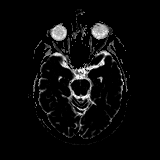
[im 26/39]
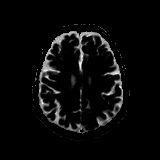
[im 39/39]
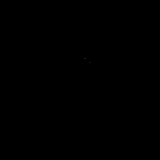

[Series 7: cor dwi_tracew · coronal · 5.0mm · 1.44mm/px · 7 of 70 slices shown]
[im 1/70]
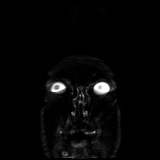
[im 12/70]
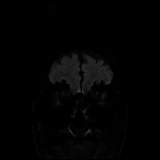
[im 24/70]
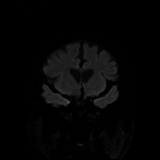
[im 35/70]
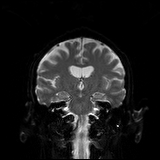
[im 47/70]
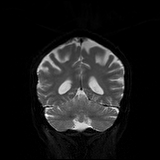
[im 58/70]
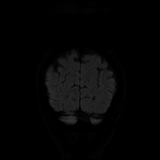
[im 70/70]
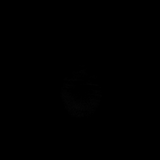

[Series 8: cor dwi_adc · coronal · 5.0mm · 1.44mm/px · 3 of 34 slices shown]
[im 1/34]
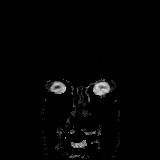
[im 17/34]
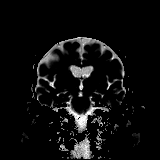
[im 34/34]
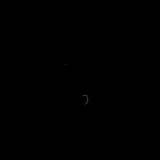

[Series 9: T1 · sagittal · 5.0mm · 0.75mm/px · 2 of 23 slices shown]
[im 1/23]
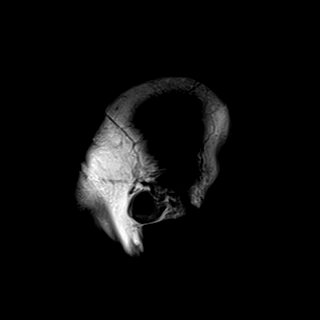
[im 23/23]
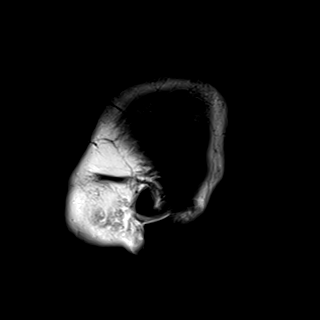

[Series 10: T2 · axial · 5.0mm · 0.72mm/px · z∈[-48,+87]mm · 2 of 25 slices shown (1 of 2)]
[im 1/25]
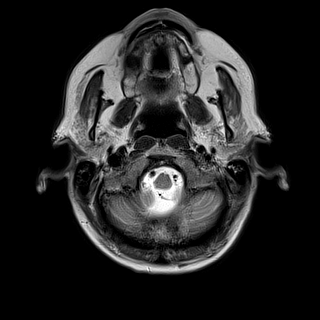
[im 25/25]
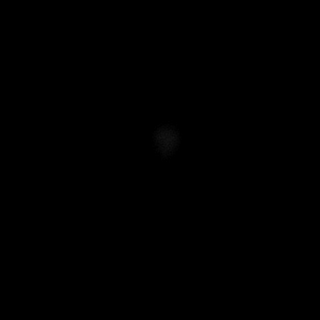

[Series 11: FLAIR · axial · 5.0mm · 0.45mm/px · z∈[-49,+86]mm · 2 of 25 slices shown]
[im 1/25]
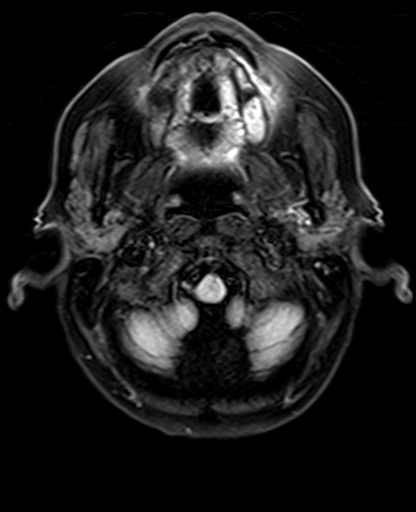
[im 25/25]
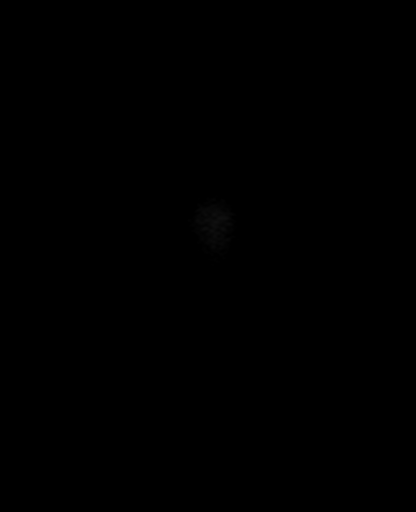

[Series 12: swi_images · axial · 3.0mm · 0.90mm/px · z∈[-75,+89]mm · 6 of 60 slices shown]
[im 1/60]
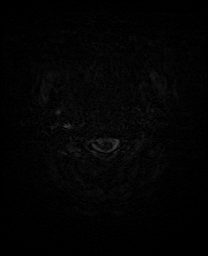
[im 12/60]
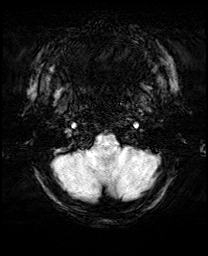
[im 24/60]
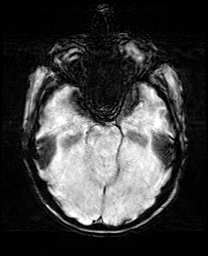
[im 36/60]
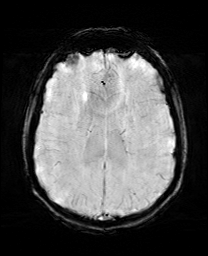
[im 48/60]
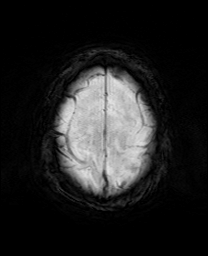
[im 60/60]
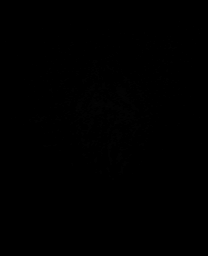

[Series 13: mip_images(sw) · axial · 24.0mm · 0.90mm/px · z∈[-66,+79]mm · 5 of 53 slices shown]
[im 1/53]
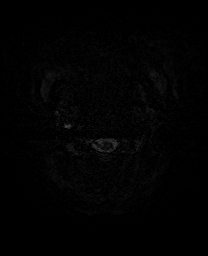
[im 14/53]
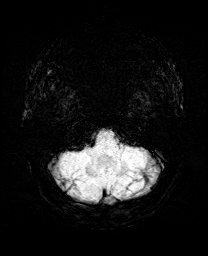
[im 27/53]
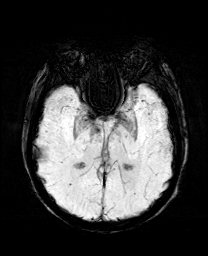
[im 40/53]
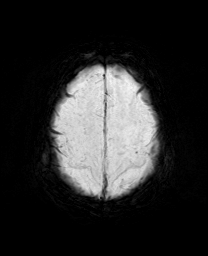
[im 53/53]
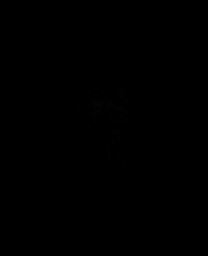

[Series 15: T2 · coronal · 5.0mm · 0.34mm/px · 3 of 29 slices shown (2 of 2)]
[im 1/29]
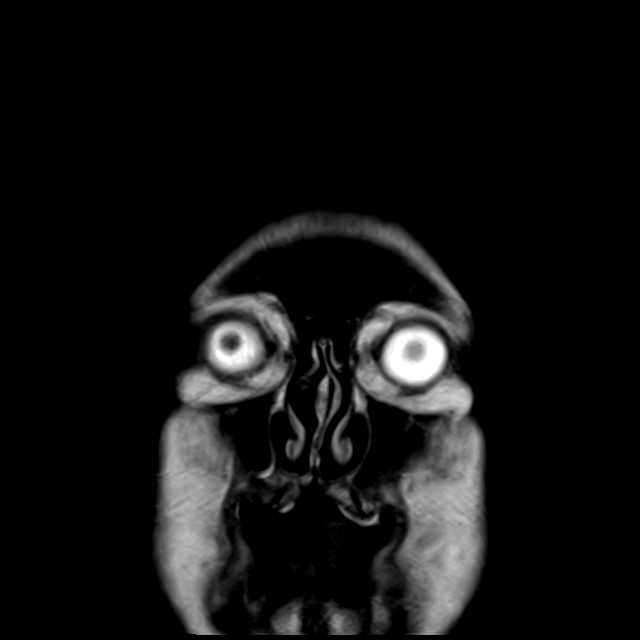
[im 15/29]
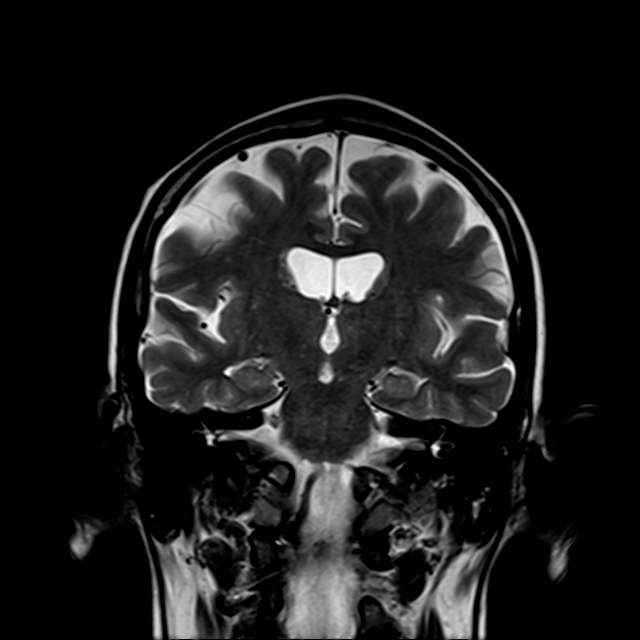
[im 29/29]
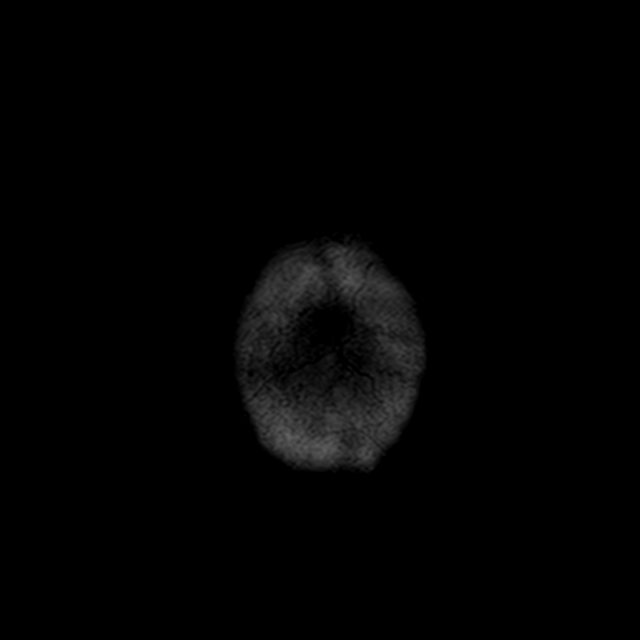

[42 of 48 positions shown; findings below may reference images not displayed]

FINDINGS: Brain: Diffusion imaging does not show any acute or subacute
infarction or other cause of restricted diffusion. Chronic
small-vessel ischemic changes affect the pons. There are a few old
small vessel cerebellar infarctions. Cerebral hemispheres show
generalized atrophy with minimal small vessel change of the white
matter. No cortical or large vessel territory infarction. No mass
lesion, acute hemorrhage, hydrocephalus or extra-axial collection.
Few scattered punctate foci of hemosiderin deposition related to old
small vessel infarctions.

Vascular: Major vessels at the base of the brain show flow.

Skull and upper cervical spine: Negative

Sinuses/Orbits: Clear/normal

Other: None
IMPRESSION: No evidence of metastatic disease. This is a noncontrast study which
limits sensitivity for tiny lesions, but there is no positive
evidence of metastatic disease.

Chronic small-vessel ischemic changes of the pons, cerebellum and to
a minimal extent the cerebral hemispheric white matter.

## 2019-06-01 MED ORDER — SODIUM CHLORIDE 0.9 % IV SOLN
1200.0000 mg | Freq: Once | INTRAVENOUS | Status: AC
  Administered 2019-06-01: 1200 mg via INTRAVENOUS
  Filled 2019-06-01: qty 20

## 2019-06-01 MED ORDER — SODIUM CHLORIDE 0.9 % IV SOLN
Freq: Once | INTRAVENOUS | Status: AC
  Administered 2019-06-01: 15:00:00 via INTRAVENOUS
  Filled 2019-06-01: qty 250

## 2019-06-01 NOTE — Patient Instructions (Signed)
-  Discussed to make sure that he takes his thyroid medication every day. Has significant hypothyroidism (underactive thyroid). Can cause confusion. Will need to monitor his labs (TSH) to make sure his dose is appropriate.  -Will include MRI of the brain next time we order a scan in addition to the chest.

## 2019-06-01 NOTE — Telephone Encounter (Signed)
Will call her.

## 2019-06-01 NOTE — Progress Notes (Signed)
[place] OFFICE PROGRESS NOTE  Patient, No Pcp Per No address on file  DIAGNOSIS: Extensive stage (T4, N2, M1a) small cell lung cancer presented with large right perihilar mass with mediastinal invasion and loculated malignant right pleural effusion diagnosed in June 2019.  PRIOR THERAPY: None  CURRENT THERAPY: Systemic chemotherapy with carboplatin for AUC of 5 on day 1, etoposide 100 mg/M2 on days 1, 2 and 3 in addition to Tecentriq 1200 mg IV every 3 weeks with Neulasta support. Status post 19cycles. Starting from cycle #6 the patient will be treated with maintenance single agent Tecentriq every 3 weeks  INTERVAL HISTORY: Christopher Wilson 78 y.o. male returns to the clinic for a follow-up visit.  The patient is feeling well today without any concerning complaints. The patient's daughter called the office requesting to be called during the visit today but the patient did not wish to have his daughter available by phone today; however, her concerns were noted. His daughter expresses concern with increased forgetfulness. The patient acknowledged that he sometimes forgets what day it is because "he is not working" and therefore does not pay attention to which day it is. He denies any headaches, visual changes, gait abnormalities, seizures, nausea, vomiting, or extremity weakness. The patient also has hypothyroidism. His Synthroid was recently adjusted earlier this month and his TSH is still not optimized. The patient states that he sometimes does not take his medication because it is prescribed to be taken before breakfast and he states that sometimes he eats before he can take it.   Otherwise, the patient is tolerating his treatment well. The patient denies any fever, chills, night sweats, weight loss.  He denies any chest pain, shortness of breath, cough, or hemoptysis. He denies any nausea, vomiting, diarrhea, or constipation. He denies any rashes or skin changes. He is here today  for evaluation before starting cycle #20.   MEDICAL HISTORY: Past Medical History:  Diagnosis Date  . ACS (acute coronary syndrome), with ST elevation but stable coronary arteries on cath 06/27/12 06/27/2012  . CAD (coronary artery disease), with CABG 1989 and re-do Cabg 2002 X 3 vesssels.  progressive disease with Stent to Lt. Main, and 1st diag 2006, stent to VG to LCX 2007, with known occlusion history o  06/27/2012  . CKD (chronic kidney disease) stage 3, GFR 30-59 ml/min (HCC) 06/27/2012  . 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 Coronary artery disease   . GERD (gastroesophageal reflux disease)   . Hyperlipidemia LDL goal < 70 06/27/2012  . Hypertension   . Myocardial infarction ([place])   . Peptic ulcer   . STEMI (ST elevation myocardial infarction) ([place]) 06/27/2012  . Tobacco abuse 06/27/2012    ALLERGIES:  has No Known Allergies.  MEDICATIONS:  Current Outpatient Medications  Medication Sig Dispense Refill  . acetaminophen (TYLENOL) 325 MG tablet Take 650 mg by mouth every 6 (six) hours as needed for moderate pain.    Marland Kitchen albuterol (PROVENTIL HFA;VENTOLIN HFA) 108 (90 Base) MCG/ACT inhaler Inhale 2 puffs into the lungs every 6 (six) hours as needed for wheezing or shortness of breath.    Marland Kitchen atorvastatin (LIPITOR) 80 MG tablet TAKE ONE TABLET BY MOUTH DAILY AT 6 P.M. NEEDS APPOINTMENT FOR FUTURE REFILLS. (Patient taking differently: Take 80 mg by mouth daily at 6 PM. ) 90 tablet 3  . levothyroxine (SYNTHROID) 125 MCG tablet Take 1 tablet (125 mcg total) by mouth daily before breakfast. 30 tablet 0  . metoprolol tartrate (LOPRESSOR) 25  MG tablet Take 1 tablet (25 mg total) by mouth 2 (two) times daily. OV NEEDED. 60 tablet 0  . rivaroxaban (XARELTO) 20 MG TABS tablet Take 1 tablet (20 mg total) by mouth daily with supper. 90 tablet 1  . isosorbide mononitrate (IMDUR) 30 MG 24 hr tablet Take 1 tablet (30 mg total) by mouth daily. 90 tablet 3  . nitroGLYCERIN (NITROSTAT) 0.4 MG SL tablet Place  1 tablet (0.4 mg total) under the tongue every 5 (five) minutes as needed for chest pain. (Patient not taking: Reported on 12/15/2018) 25 tablet 3  . prochlorperazine (COMPAZINE) 10 MG tablet Take 1 tablet (10 mg total) by mouth every 6 (six) hours as needed for nausea or vomiting. (Patient not taking: Reported on 12/15/2018) 30 tablet 0   No current facility-administered medications for this visit.     SURGICAL HISTORY:  Past Surgical History:  Procedure Laterality Date  . coratid arterty surgery    . CORONARY ANGIOPLASTY WITH STENT PLACEMENT    . CORONARY ARTERY BYPASS GRAFT    . IR GUIDED DRAIN W CATHETER PLACEMENT  04/08/2018  . IR REMOVAL OF PLURAL CATH W/CUFF  06/19/2018  . LCEA    . LEFT HEART CATH N/A 06/27/2012   Procedure: LEFT HEART CATH;  Surgeon: Troy Sine, MD;  Location: [facility] C[facility] LAB;  Service: Cardiovascular;  Laterality: N/A;  . triple bipass     Hx of CABG and RE do  . VASCULAR SURGERY    . VIDEO BRONCHOSCOPY Bilateral 04/07/2018   Procedure: VIDEO BRONCHOSCOPY WITHOUT FLUORO;  Surgeon: Rush Farmer, MD;  Location: [facility] ENDOSCOPY;  Service: Cardiopulmonary;  Laterality: Bilateral;    REVIEW OF SYSTEMS:   Review of Systems  Constitutional: Negative for appetite change, chills, fatigue, fever and unexpected weight change.  HENT: Negative for mouth sores, nosebleeds, sore throat and trouble swallowing.   Eyes: Negative for eye problems and icterus.  Respiratory: Negative for cough, hemoptysis, shortness of breath and wheezing.   Cardiovascular: Negative for chest pain and leg swelling.  Gastrointestinal: Negative for abdominal pain, constipation, diarrhea, nausea and vomiting.  Genitourinary: Negative for bladder incontinence, difficulty urinating, dysuria, frequency and hematuria.   Musculoskeletal: Negative for back pain, gait problem, neck pain and neck stiffness.  Skin: Negative for itching and rash.  Neurological: Negative for dizziness, extremity weakness, gait  problem, headaches, light-headedness and seizures.  Hematological: Negative for adenopathy. Does not bruise/bleed easily.  Psychiatric/Behavioral: Positive for forgetfulness. Negative for depression and sleep disturbance. The patient is not nervous/anxious.     PHYSICAL EXAMINATION:  Blood pressure (!) 153/92, pulse 87, temperature 97.8 F (36.6 C), temperature source Oral, resp. rate 14, height 5\' 10"  (1.778 m), weight 157 lb 6.4 oz (71.4 kg), SpO2 98 %.  ECOG PERFORMANCE STATUS: 1 - Symptomatic but completely ambulatory  Physical Exam  Constitutional: Oriented to person, place, and time and thin and chronically ill appearing male and in no distress.  HENT:  Head: Normocephalic and atraumatic.  Mouth/Throat: Oropharynx is clear and moist. No oropharyngeal exudate.  Eyes: Conjunctivae are normal. Right eye exhibits no discharge. Left eye exhibits no discharge. No scleral icterus.  Neck: Normal range of motion. Neck supple.  Cardiovascular: Normal rate, regular rhythm, normal heart sounds and intact distal pulses.   Pulmonary/Chest: Effort normal and breath sounds normal but quiet. No respiratory distress. No wheezes. No rales.  Abdominal: Soft. Bowel sounds are normal. Exhibits no distension and no mass. There is no tenderness.  Musculoskeletal: Normal range of  motion. Exhibits no edema.  Lymphadenopathy:    No cervical adenopathy.  Neurological: Alert and oriented to person, place, and time. Exhibits normal muscle tone. Gait normal. Coordination normal. Cranial nerves difficult to assess due to patient not following instructions. Strength normal. Reflexes normal.  Skin: Skin is warm and dry. No rash noted. Not diaphoretic. No erythema. No pallor.  Psychiatric: Mood, memory and judgment normal.  Vitals reviewed.  LABORATORY DATA: Lab Results  Component Value Date   WBC 6.8 06/01/2019   HGB 14.2 06/01/2019   HCT 44.2 06/01/2019   MCV 99.8 06/01/2019   PLT 191 06/01/2019       Chemistry      Component Value Date/Time   NA 137 06/01/2019 1249   NA 140 03/21/2018 1048   K 4.9 06/01/2019 1249   CL 107 06/01/2019 1249   CO2 22 06/01/2019 1249   BUN 29 (H) 06/01/2019 1249   BUN 45 (H) 03/21/2018 1048   CREATININE 2.47 (H) 06/01/2019 1249   CREATININE 1.80 (H) 09/06/2014 1033      Component Value Date/Time   CALCIUM 9.8 06/01/2019 1249   ALKPHOS 71 06/01/2019 1249   AST 16 06/01/2019 1249   ALT 9 06/01/2019 1249   BILITOT 0.3 06/01/2019 1249       RADIOGRAPHIC STUDIES:  Ct Chest Wo Contrast  Result Date: 05/08/2019 CLINICAL DATA:  Right lung cancer, for which the patient is ongoing chemotherapy. EXAM: CT CHEST WITHOUT CONTRAST TECHNIQUE: Multidetector CT imaging of the chest was performed following the standard protocol without IV contrast. COMPARISON:  Mar 06 2019 FINDINGS: Cardiovascular: Postsurgical changes of CABG. Normal heart size. Subendocardial fat along the left ventricle, likely sequela of prior MI. No pericardial effusion. Ectatic atherosclerotic a Lortab. Calcific coronary artery disease. Mediastinum/Nodes: Stable low right paratracheal lymph node measures 8 mm in short axis. Lungs/Pleura: Upper lobe predominant paraseptal emphysema. Stable nodular opacity in the right lung apex measures 13 mm, image 22/174, sequence 5. Stable 9 mm nodule in the posterior right lung apex, image 21/174, sequence 5. Slightly decreased in size spiculated mass in the central right upper lobe measures 3.2 x 2.9 cm, image 76/174, sequence 5, prior measurement of 3.8 x 3.0 cm. Persistent peribronchial airspace disease versus scarring in the right lower lobe. No pleural effusion or pneumothorax. Upper Abdomen: Atrophic right kidney. Left nephrolithiasis with the largest calculus measuring 8 mm. 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 IMPRESSION: 1. Slightly decreased in size 3.2 cm central right upper lobe mass. 2. Stable  pulmonary nodules in the right lung apex. 3. Stable peribronchial airspace disease versus scarring in the right lower lobe. Aortic Atherosclerosis (ICD10-I70.0) and Emphysema (ICD10-J43.9). Electronically Signed   By: Fidela Salisbury M.D.   On: 05/08/2019 17:13     ASSESSMENT/PLAN:  This is a very pleasant 78 year old Caucasian male diagnosed with extensive stage small cell lung cancer.  He presented with a large perihilar mass with mediastinal invasion and a loculated malignant pleural effusion.  He was diagnosed in June 2019.  He is currently undergoing systemic chemotherapy with carboplatin, etoposide, and Tecentriq.  Starting from cycle #5, the patient has been receiving maintenance Tecentriq 1200 mg IV every 3 weeks.  He is status post 19 cycles.  He has been tolerating treatment well without any adverse effects.  The patient was seen with Dr. Julien Nordmann today.  Labs were reviewed with the patient. We recommend that he proceed with cycle #20 today scheduled.  We will see the  patient back for a follow up visit in 3 weeks for evaluation before starting cycle #21  The patient's synthroid was recently increased to 125 mcg 04/29/2019. We will continue to monitor and adjust his dose if needed. His dose was recently adjusted so we will wait 6-8 weeks from his last dose adjustment before making a decision. I discussed the importance of taking his medications as prescribed and consistently in order to help [place] assess if his dose is appropriate. His hypothyroidism can possibly be contributory to his forgetfulness.   The patient was alert and oriented to person, place, and time today. I discussed the patient's daughters concerns of forgetfulness with Dr. Julien Nordmann who recommended that we can consider including a brain MRI to be performed with his next restaging CT scan of the chest, abdomen, and pelvis. The patient did not wish for me to call his daughter during the appointment today but I did provide him a  print out of his AVS with a summary of what we discussed for his daughter.   The patient was advised to call immediately if he has any concerning symptoms in the interval. The patient voices understanding of current disease status and treatment options and is in agreement with the current care plan. All questions were answered. The patient knows to call the clinic with any problems, questions or concerns. We can certainly see the patient much sooner if necessary   No orders of the defined types were placed in this encounter.    Loraine Bhullar L Gilberto Stanforth, PA-C 06/01/19  ADDENDUM: Hematology/Oncology Attending: I had a face-to-face encounter with the patient today.  I recommended his care plan.  This is a very pleasant 78 years old white male with extensive stage small cell lung cancer status post induction treatment with carboplatin, etoposide and Tecentriq and he is currently on maintenance treatment with Tecentriq status post 19 cycles.  He has been tolerating his treatment well with no concerning adverse effects. His last imaging study showed no concerning findings for disease progression.  There was concern from his daughter about some judgment calls from the patient but when discussed with the patient today he mentions that he is feeling fine and he does not want his daughter to be called with any update at this point. We will continue with his treatment today as planned and he will proceed with cycle #20. We will consider repeating MRI of the brain with his upcoming imaging studies in around 6 weeks from now or sooner if needed. The patient was advised to call immediately if he has any concerning symptoms in the interval.  Disclaimer: This note was dictated with voice recognition software. Similar sounding words can inadvertently be transcribed and may be missed upon review. Eilleen Kempf, MD 06/01/19

## 2019-06-01 NOTE — Patient Instructions (Signed)
[facility] Discharge Instructions for Patients Receiving Chemotherapy  Today you received the following chemotherapy agents: Tecentriq  To help prevent nausea and vomiting after your treatment, we encourage you to take your nausea medication as directed.   If you develop nausea and vomiting that is not controlled by your nausea medication, call the clinic.   BELOW ARE SYMPTOMS THAT SHOULD BE REPORTED IMMEDIATELY:  *FEVER GREATER THAN 100.5 F  *CHILLS WITH OR WITHOUT FEVER  NAUSEA AND VOMITING THAT IS NOT CONTROLLED WITH YOUR NAUSEA MEDICATION  *UNUSUAL SHORTNESS OF BREATH  *UNUSUAL BRUISING OR BLEEDING  TENDERNESS IN MOUTH AND THROAT WITH OR WITHOUT PRESENCE OF ULCERS  *URINARY PROBLEMS  *BOWEL PROBLEMS  UNUSUAL RASH Items with * indicate a potential emergency and should be followed up as soon as possible.  Feel free to call the clinic should you have any questions or concerns. The clinic phone number is [phone].  Please show the CHEMO ALERT CARD at check-in to the Emergency Department and triage nurse.   

## 2019-06-01 NOTE — Progress Notes (Signed)
Dr. Julien Nordmann informed of creatnine 2.47 today.  Per MD OK to proceed with treatment today.

## 2019-06-05 ENCOUNTER — Other Ambulatory Visit: Payer: Self-pay | Admitting: Cardiovascular Disease

## 2019-06-05 NOTE — Telephone Encounter (Signed)
 *  STAT* If patient is at the pharmacy, call can be transferred to refill team.   1. Which medications need to be refilled? (please list name of each medication and dose if known) nitroGLYCERIN (NITROSTAT) 0.4 MG SL tablet  2. Which pharmacy/location (including street and city if local pharmacy) is medication to be sent to? [facility] [place]   3. Do they need a 30 day or 90 day supply? [place]

## 2019-06-08 MED ORDER — NITROGLYCERIN 0.4 MG SL SUBL: 0.4000 mg | SUBLINGUAL_TABLET | SUBLINGUAL | 0 refills | Status: AC | PRN

## 2019-06-08 NOTE — Telephone Encounter (Signed)
Rx(s) sent to pharmacy electronically.  

## 2019-06-22 ENCOUNTER — Inpatient Hospital Stay: Payer: Medicare HMO

## 2019-06-22 ENCOUNTER — Other Ambulatory Visit: Payer: Self-pay

## 2019-06-22 ENCOUNTER — Encounter: Payer: Self-pay | Admitting: Internal Medicine

## 2019-06-22 ENCOUNTER — Telehealth: Payer: Self-pay | Admitting: Internal Medicine

## 2019-06-22 ENCOUNTER — Inpatient Hospital Stay (HOSPITAL_BASED_OUTPATIENT_CLINIC_OR_DEPARTMENT_OTHER): Payer: Medicare HMO | Admitting: Internal Medicine

## 2019-06-22 VITALS — BP 107/80 | HR 69 | Temp 98.5°F | Resp 18 | Ht 70.0 in | Wt 153.4 lb

## 2019-06-22 DIAGNOSIS — C342 Malignant neoplasm of middle lobe, bronchus or lung: Secondary | ICD-10-CM | POA: Diagnosis not present

## 2019-06-22 DIAGNOSIS — C3411 Malignant neoplasm of upper lobe, right bronchus or lung: Secondary | ICD-10-CM

## 2019-06-22 DIAGNOSIS — Z79899 Other long term (current) drug therapy: Secondary | ICD-10-CM | POA: Diagnosis not present

## 2019-06-22 DIAGNOSIS — Z7901 Long term (current) use of anticoagulants: Secondary | ICD-10-CM | POA: Diagnosis not present

## 2019-06-22 DIAGNOSIS — Z5112 Encounter for antineoplastic immunotherapy: Secondary | ICD-10-CM

## 2019-06-22 DIAGNOSIS — E039 Hypothyroidism, unspecified: Secondary | ICD-10-CM | POA: Diagnosis not present

## 2019-06-22 DIAGNOSIS — I252 Old myocardial infarction: Secondary | ICD-10-CM | POA: Diagnosis not present

## 2019-06-22 DIAGNOSIS — I129 Hypertensive chronic kidney disease with stage 1 through stage 4 chronic kidney disease, or unspecified chronic kidney disease: Secondary | ICD-10-CM | POA: Diagnosis not present

## 2019-06-22 DIAGNOSIS — N183 Chronic kidney disease, stage 3 (moderate): Secondary | ICD-10-CM | POA: Diagnosis not present

## 2019-06-22 LAB — CBC WITH DIFFERENTIAL (CANCER CENTER ONLY)
Abs Immature Granulocytes: 0.02 10*3/uL (ref 0.00–0.07)
Basophils Absolute: 0.1 10*3/uL (ref 0.0–0.1)
Basophils Relative: 1 %
Eosinophils Absolute: 0.1 10*3/uL (ref 0.0–0.5)
Eosinophils Relative: 1 %
HCT: 44.2 % (ref 39.0–52.0)
Hemoglobin: 14.2 g/dL (ref 13.0–17.0)
Immature Granulocytes: 0 %
Lymphocytes Relative: 14 %
Lymphs Abs: 1 10*3/uL (ref 0.7–4.0)
MCH: 32.1 pg (ref 26.0–34.0)
MCHC: 32.1 g/dL (ref 30.0–36.0)
MCV: 99.8 fL (ref 80.0–100.0)
Monocytes Absolute: 0.5 10*3/uL (ref 0.1–1.0)
Monocytes Relative: 7 %
Neutro Abs: 5.3 10*3/uL (ref 1.7–7.7)
Neutrophils Relative %: 77 %
Platelet Count: 169 10*3/uL (ref 150–400)
RBC: 4.43 MIL/uL (ref 4.22–5.81)
RDW: 14 % (ref 11.5–15.5)
WBC Count: 6.9 10*3/uL (ref 4.0–10.5)
nRBC: 0 % (ref 0.0–0.2)

## 2019-06-22 LAB — CMP (CANCER CENTER ONLY)
ALT: 8 U/L (ref 0–44)
AST: 14 U/L — ABNORMAL LOW (ref 15–41)
Albumin: 3.8 g/dL (ref 3.5–5.0)
Alkaline Phosphatase: 74 U/L (ref 38–126)
Anion gap: 10 (ref 5–15)
BUN: 41 mg/dL — ABNORMAL HIGH (ref 8–23)
CO2: 21 mmol/L — ABNORMAL LOW (ref 22–32)
Calcium: 9.9 mg/dL (ref 8.9–10.3)
Chloride: 110 mmol/L (ref 98–111)
Creatinine: 2.76 mg/dL — ABNORMAL HIGH (ref 0.61–1.24)
GFR, Est AFR Am: 25 mL/min — ABNORMAL LOW (ref >60)
GFR, Estimated: 21 mL/min — ABNORMAL LOW (ref >60)
Glucose, Bld: 98 mg/dL (ref 70–99)
Potassium: 5.3 mmol/L — ABNORMAL HIGH (ref 3.5–5.1)
Sodium: 141 mmol/L (ref 135–145)
Total Bilirubin: 0.5 mg/dL (ref 0.3–1.2)
Total Protein: 7.6 g/dL (ref 6.5–8.1)

## 2019-06-22 LAB — TSH: TSH: 5.169 u[IU]/mL — ABNORMAL HIGH (ref 0.320–4.118)

## 2019-06-22 MED ORDER — SODIUM CHLORIDE 0.9 % IV SOLN
Freq: Once | INTRAVENOUS | Status: AC
  Administered 2019-06-22: 13:00:00 via INTRAVENOUS
  Filled 2019-06-22: qty 250

## 2019-06-22 MED ORDER — SODIUM CHLORIDE 0.9 % IV SOLN
1200.0000 mg | Freq: Once | INTRAVENOUS | Status: AC
  Administered 2019-06-22: 1200 mg via INTRAVENOUS
  Filled 2019-06-22: qty 20

## 2019-06-22 NOTE — Progress Notes (Signed)
[place] [phone]   [phone]  OFFICE PROGRESS NOTE  Patient, No Pcp Per No address on file  DIAGNOSIS: Extensive stage (T4, N2, M1a) small cell lung cancer presented with large right perihilar mass with mediastinal invasion and loculated malignant right pleural effusion diagnosed in June 2019.  PRIOR THERAPY:None.  CURRENT THERAPY: Systemic chemotherapy with carboplatin for AUC of 5 on day 1, etoposide 100 mg/M2 on days 1, 2 and 3 in addition to Tecentriq 1200 mg IV every 3 weeks with Neulasta support.  Status post 20 cycles.  Starting from cycle #6 the patient will be treated with maintenance single agent Tecentriq every 3 weeks  INTERVAL HISTORY: Christopher Wilson 78 y.o. male returns to the clinic today for follow-up visit.  The patient is feeling fine today with no concerning complaints except for fatigue.  He denied having any chest pain, shortness of breath, cough or hemoptysis.  He denied having any fever or chills.  He has no nausea, vomiting, diarrhea or constipation.  He has no headache or visual changes.  The patient is here today for evaluation before starting cycle #21 of his treatment.   MEDICAL HISTORY: Past Medical History:  Diagnosis Date  . ACS (acute coronary syndrome), with ST elevation but stable coronary arteries on cath 06/27/12 06/27/2012  . CAD (coronary artery disease), with CABG 1989 and re-do Cabg 2002 X 3 vesssels.  progressive disease with Stent to Lt. Main, and 1st diag 2006, stent to VG to LCX 2007, with known occlusion history o  06/27/2012  . CKD (chronic kidney disease) stage 3, GFR 30-59 ml/min (HCC) 06/27/2012  . 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 Coronary artery disease   . GERD (gastroesophageal reflux disease)   . Hyperlipidemia LDL goal < 70 06/27/2012  . Hypertension   . Myocardial infarction ([place])   . Peptic ulcer   . STEMI (ST elevation myocardial infarction) ([place]) 06/27/2012  . Tobacco abuse  06/27/2012    ALLERGIES:  has No Known Allergies.  MEDICATIONS:  Current Outpatient Medications  Medication Sig Dispense Refill  . acetaminophen (TYLENOL) 325 MG tablet Take 650 mg by mouth every 6 (six) hours as needed for moderate pain.    Marland Kitchen albuterol (PROVENTIL HFA;VENTOLIN HFA) 108 (90 Base) MCG/ACT inhaler Inhale 2 puffs into the lungs every 6 (six) hours as needed for wheezing or shortness of breath.    Marland Kitchen atorvastatin (LIPITOR) 80 MG tablet TAKE ONE TABLET BY MOUTH DAILY AT 6 P.M. NEEDS APPOINTMENT FOR FUTURE REFILLS. (Patient taking differently: Take 80 mg by mouth daily at 6 PM. ) 90 tablet 3  . isosorbide mononitrate (IMDUR) 30 MG 24 hr tablet Take 1 tablet (30 mg total) by mouth daily. 90 tablet 3  . levothyroxine (SYNTHROID) 125 MCG tablet Take 1 tablet (125 mcg total) by mouth daily before breakfast. 30 tablet 0  . metoprolol tartrate (LOPRESSOR) 25 MG tablet Take 1 tablet (25 mg total) by mouth 2 (two) times daily. OV NEEDED. 60 tablet 0  . nitroGLYCERIN (NITROSTAT) 0.4 MG SL tablet Place 1 tablet (0.4 mg total) under the tongue every 5 (five) minutes as needed for chest pain. 25 tablet 0  . prochlorperazine (COMPAZINE) 10 MG tablet Take 1 tablet (10 mg total) by mouth every 6 (six) hours as needed for nausea or vomiting. (Patient not taking: Reported on 12/15/2018) 30 tablet 0  . rivaroxaban (XARELTO) 20 MG TABS tablet Take 1 tablet (20 mg total) by mouth daily  with supper. 90 tablet 1   No current facility-administered medications for this visit.     SURGICAL HISTORY:  Past Surgical History:  Procedure Laterality Date  . coratid arterty surgery    . CORONARY ANGIOPLASTY WITH STENT PLACEMENT    . CORONARY ARTERY BYPASS GRAFT    . IR GUIDED DRAIN W CATHETER PLACEMENT  04/08/2018  . IR REMOVAL OF PLURAL CATH W/CUFF  06/19/2018  . LCEA    . LEFT HEART CATH N/A 06/27/2012   Procedure: LEFT HEART CATH;  Surgeon: Troy Sine, MD;  Location: [facility] CATH LAB;  Service: Cardiovascular;   Laterality: N/A;  . triple bipass     Hx of CABG and RE do  . VASCULAR SURGERY    . VIDEO BRONCHOSCOPY Bilateral 04/07/2018   Procedure: VIDEO BRONCHOSCOPY WITHOUT FLUORO;  Surgeon: Rush Farmer, MD;  Location: [facility] ENDOSCOPY;  Service: Cardiopulmonary;  Laterality: Bilateral;    REVIEW OF SYSTEMS:  A comprehensive review of systems was negative except for: Constitutional: positive for fatigue and weight loss   PHYSICAL EXAMINATION: General appearance: alert, cooperative and no distress Head: Normocephalic, without obvious abnormality, atraumatic Neck: no adenopathy, no JVD, supple, symmetrical, trachea midline and thyroid not enlarged, symmetric, no tenderness/mass/nodules Lymph nodes: Cervical, supraclavicular, and axillary nodes normal. Resp: clear to auscultation bilaterally Back: symmetric, no curvature. ROM normal. No CVA tenderness. Cardio: regular rate and rhythm, S1, S2 normal, no murmur, click, rub or gallop GI: soft, non-tender; bowel sounds normal; no masses,  no organomegaly Extremities: extremities normal, atraumatic, no cyanosis or edema  ECOG PERFORMANCE STATUS: 1 - Symptomatic but completely ambulatory  Blood pressure 107/80, pulse 69, temperature 98.5 F (36.9 C), temperature source Oral, resp. rate 18, height 5\' 10"  (1.778 m), weight 153 lb 6.4 oz (69.6 kg), SpO2 100 %.  LABORATORY DATA: Lab Results  Component Value Date   WBC 6.9 06/22/2019   HGB 14.2 06/22/2019   HCT 44.2 06/22/2019   MCV 99.8 06/22/2019   PLT 169 06/22/2019      Chemistry      Component Value Date/Time   NA 137 06/01/2019 1249   NA 140 03/21/2018 1048   K 4.9 06/01/2019 1249   CL 107 06/01/2019 1249   CO2 22 06/01/2019 1249   BUN 29 (H) 06/01/2019 1249   BUN 45 (H) 03/21/2018 1048   CREATININE 2.47 (H) 06/01/2019 1249   CREATININE 1.80 (H) 09/06/2014 1033      Component Value Date/Time   CALCIUM 9.8 06/01/2019 1249   ALKPHOS 71 06/01/2019 1249   AST 16 06/01/2019 1249   ALT 9  06/01/2019 1249   BILITOT 0.3 06/01/2019 1249       RADIOGRAPHIC STUDIES: No results found.  ASSESSMENT AND PLAN: This is a very pleasant 78 years old white male recently diagnosed with extensive stage small cell lung cancer and currently undergoing systemic chemotherapy with carboplatin, etoposide and Tecentriq status post 5 cycles. He is also status post 15 cycle of maintenance treatment with single agent Tecentriq.   The patient has been tolerating his treatment well with no concerning adverse effect except for fatigue. I recommended for the patient to proceed with cycle #16 of his maintenance treatment today as planned. For the history of hypertension, he will continue with his current blood pressure medication. For hypothyroidism, he will continue with levothyroxine and we will adjust his dose as needed.  We will continue to monitor TSH closely. I will see him back for follow-up visit in 3 weeks  for evaluation before the next cycle of his treatment. He was advised to call immediately if he has any concerning symptoms in the interval. The patient voices understanding of current disease status and treatment options and is in agreement with the current care plan.  All questions were answered. The patient knows to call the clinic with any problems, questions or concerns. We can certainly see the patient much sooner if necessary.  Disclaimer: This note was dictated with voice recognition software. Similar sounding words can inadvertently be transcribed and may not be corrected upon review.

## 2019-06-22 NOTE — Patient Instructions (Signed)
[facility] Discharge Instructions for Patients Receiving Chemotherapy  Today you received the following chemotherapy agents: Tecentriq  To help prevent nausea and vomiting after your treatment, we encourage you to take your nausea medication as directed.   If you develop nausea and vomiting that is not controlled by your nausea medication, call the clinic.   BELOW ARE SYMPTOMS THAT SHOULD BE REPORTED IMMEDIATELY:  *FEVER GREATER THAN 100.5 F  *CHILLS WITH OR WITHOUT FEVER  NAUSEA AND VOMITING THAT IS NOT CONTROLLED WITH YOUR NAUSEA MEDICATION  *UNUSUAL SHORTNESS OF BREATH  *UNUSUAL BRUISING OR BLEEDING  TENDERNESS IN MOUTH AND THROAT WITH OR WITHOUT PRESENCE OF ULCERS  *URINARY PROBLEMS  *BOWEL PROBLEMS  UNUSUAL RASH Items with * indicate a potential emergency and should be followed up as soon as possible.  Feel free to call the clinic should you have any questions or concerns. The clinic phone number is [phone].  Please show the CHEMO ALERT CARD at check-in to the Emergency Department and triage nurse.   

## 2019-06-22 NOTE — Progress Notes (Signed)
Per Dr. Julien Nordmann, pt is Ok to treat with today's labs

## 2019-06-22 NOTE — Telephone Encounter (Signed)
Scheduled appt per 8/24 los - added additional appts to what was already scheduled. Pt to get an updated schedule next visit.

## 2019-06-22 NOTE — Patient Instructions (Signed)
Steps to Quit Smoking Smoking tobacco is the leading cause of preventable death. It can affect almost every organ in the body. Smoking puts you and people around you at risk for many serious, long-lasting (chronic) diseases. Quitting smoking can be hard, but it is one of the best things that you can do for your health. It is never too late to quit. How do I get ready to quit? When you decide to quit smoking, make a plan to help you succeed. Before you quit:  Pick a date to quit. Set a date within the next 2 weeks to give you time to prepare.  Write down the reasons why you are quitting. Keep this list in places where you will see it often.  Tell your family, friends, and co-workers that you are quitting. Their support is important.  Talk with your doctor about the choices that may help you quit.  Find out if your health insurance will pay for these treatments.  Know the people, places, things, and activities that make you want to smoke (triggers). Avoid them. What first steps can I take to quit smoking?  Throw away all cigarettes at home, at work, and in your car.  Throw away the things that you use when you smoke, such as ashtrays and lighters.  Clean your car. Make sure to empty the ashtray.  Clean your home, including curtains and carpets. What can I do to help me quit smoking? Talk with your doctor about taking medicines and seeing a counselor at the same time. You are more likely to succeed when you do both.  If you are pregnant or breastfeeding, talk with your doctor about counseling or other ways to quit smoking. Do not take medicine to help you quit smoking unless your doctor tells you to do so. To quit smoking: Quit right away  Quit smoking totally, instead of slowly cutting back on how much you smoke over a period of time.  Go to counseling. You are more likely to quit if you go to counseling sessions regularly. Take medicine You may take medicines to help you quit. Some  medicines need a prescription, and some you can buy over-the-counter. Some medicines may contain a drug called nicotine to replace the nicotine in cigarettes. Medicines may:  Help you to stop having the desire to smoke (cravings).  Help to stop the problems that come when you stop smoking (withdrawal symptoms). Your doctor may ask you to use:  Nicotine patches, gum, or lozenges.  Nicotine inhalers or sprays.  Non-nicotine medicine that is taken by mouth. Find resources Find resources and other ways to help you quit smoking and remain smoke-free after you quit. These resources are most helpful when you use them often. They include:  Online chats with a counselor.  Phone quitlines.  Printed self-help materials.  Support groups or group counseling.  Text messaging programs.  Mobile phone apps. Use apps on your mobile phone or tablet that can help you stick to your quit plan. There are many free apps for mobile phones and tablets as well as websites. Examples include Quit Guide from the CDC and smokefree.gov  What things can I do to make it easier to quit?   Talk to your family and friends. Ask them to support and encourage you.  Call a phone quitline (1-800-QUIT-NOW), reach out to support groups, or work with a counselor.  Ask people who smoke to not smoke around you.  Avoid places that make you want to smoke,   such as: ? Bars. ? Parties. ? Smoke-break areas at work.  Spend time with people who do not smoke.  Lower the stress in your life. Stress can make you want to smoke. Try these things to help your stress: ? Getting regular exercise. ? Doing deep-breathing exercises. ? Doing yoga. ? Meditating. ? Doing a body scan. To do this, close your eyes, focus on one area of your body at a time from head to toe. Notice which parts of your body are tense. Try to relax the muscles in those areas. How will I feel when I quit smoking? Day 1 to 3 weeks Within the first 24 hours,  you may start to have some problems that come from quitting tobacco. These problems are very bad 2-3 days after you quit, but they do not often last for more than 2-3 weeks. You may get these symptoms:  Mood swings.  Feeling restless, nervous, angry, or annoyed.  Trouble concentrating.  Dizziness.  Strong desire for high-sugar foods and nicotine.  Weight gain.  Trouble pooping (constipation).  Feeling like you may vomit (nausea).  Coughing or a sore throat.  Changes in how the medicines that you take for other issues work in your body.  Depression.  Trouble sleeping (insomnia). Week 3 and afterward After the first 2-3 weeks of quitting, you may start to notice more positive results, such as:  Better sense of smell and taste.  Less coughing and sore throat.  Slower heart rate.  Lower blood pressure.  Clearer skin.  Better breathing.  Fewer sick days. Quitting smoking can be hard. Do not give up if you fail the first time. Some people need to try a few times before they succeed. Do your best to stick to your quit plan, and talk with your doctor if you have any questions or concerns. Summary  Smoking tobacco is the leading cause of preventable death. Quitting smoking can be hard, but it is one of the best things that you can do for your health.  When you decide to quit smoking, make a plan to help you succeed.  Quit smoking right away, not slowly over a period of time.  When you start quitting, seek help from your doctor, family, or friends. This information is not intended to replace advice given to you by your health care provider. Make sure you discuss any questions you have with your health care provider. Document Released: 08/11/2009 Document Revised: 01/02/2019 Document Reviewed: 01/03/2019 Elsevier Patient Education  2020 [employer]  

## 2019-06-30 ENCOUNTER — Telehealth: Payer: Self-pay | Admitting: Medical Oncology

## 2019-06-30 DIAGNOSIS — C3411 Malignant neoplasm of upper lobe, right bronchus or lung: Secondary | ICD-10-CM

## 2019-06-30 NOTE — Telephone Encounter (Signed)
[address] out of recliner twice. Dtr reports he is weaker and weaker. Requests shower chair and a recliner that he can get out of safely. Referral called to [facility] /Orders/records and insurance  faxed to [facility].It may be next week before they can see pt but they will make contact with daughter this week.

## 2019-07-02 ENCOUNTER — Telehealth: Payer: Self-pay | Admitting: Medical Oncology

## 2019-07-02 NOTE — Telephone Encounter (Signed)
Faxed order for shower chair and for chair to help him get up easier. Dtr notified.

## 2019-07-13 ENCOUNTER — Inpatient Hospital Stay (HOSPITAL_BASED_OUTPATIENT_CLINIC_OR_DEPARTMENT_OTHER): Payer: Medicare HMO | Admitting: Internal Medicine

## 2019-07-13 ENCOUNTER — Inpatient Hospital Stay: Payer: Medicare HMO

## 2019-07-13 ENCOUNTER — Inpatient Hospital Stay: Payer: Medicare HMO | Attending: Internal Medicine

## 2019-07-13 ENCOUNTER — Telehealth: Payer: Self-pay | Admitting: Internal Medicine

## 2019-07-13 ENCOUNTER — Telehealth: Payer: Self-pay | Admitting: Medical Oncology

## 2019-07-13 ENCOUNTER — Other Ambulatory Visit: Payer: Self-pay

## 2019-07-13 ENCOUNTER — Encounter: Payer: Self-pay | Admitting: Internal Medicine

## 2019-07-13 VITALS — BP 126/78 | HR 69 | Temp 99.1°F | Resp 17 | Ht 70.0 in | Wt 153.2 lb

## 2019-07-13 DIAGNOSIS — C3490 Malignant neoplasm of unspecified part of unspecified bronchus or lung: Secondary | ICD-10-CM | POA: Diagnosis not present

## 2019-07-13 DIAGNOSIS — I252 Old myocardial infarction: Secondary | ICD-10-CM | POA: Insufficient documentation

## 2019-07-13 DIAGNOSIS — N183 Chronic kidney disease, stage 3 (moderate): Secondary | ICD-10-CM | POA: Diagnosis not present

## 2019-07-13 DIAGNOSIS — Z79899 Other long term (current) drug therapy: Secondary | ICD-10-CM | POA: Insufficient documentation

## 2019-07-13 DIAGNOSIS — C342 Malignant neoplasm of middle lobe, bronchus or lung: Secondary | ICD-10-CM | POA: Insufficient documentation

## 2019-07-13 DIAGNOSIS — J910 Malignant pleural effusion: Secondary | ICD-10-CM | POA: Insufficient documentation

## 2019-07-13 DIAGNOSIS — Z5112 Encounter for antineoplastic immunotherapy: Secondary | ICD-10-CM | POA: Insufficient documentation

## 2019-07-13 DIAGNOSIS — C3411 Malignant neoplasm of upper lobe, right bronchus or lung: Secondary | ICD-10-CM

## 2019-07-13 DIAGNOSIS — Z7901 Long term (current) use of anticoagulants: Secondary | ICD-10-CM | POA: Diagnosis not present

## 2019-07-13 DIAGNOSIS — I129 Hypertensive chronic kidney disease with stage 1 through stage 4 chronic kidney disease, or unspecified chronic kidney disease: Secondary | ICD-10-CM | POA: Insufficient documentation

## 2019-07-13 DIAGNOSIS — I10 Essential (primary) hypertension: Secondary | ICD-10-CM

## 2019-07-13 LAB — CBC WITH DIFFERENTIAL (CANCER CENTER ONLY)
Abs Immature Granulocytes: 0.02 10*3/uL (ref 0.00–0.07)
Basophils Absolute: 0.1 10*3/uL (ref 0.0–0.1)
Basophils Relative: 1 %
Eosinophils Absolute: 0.1 10*3/uL (ref 0.0–0.5)
Eosinophils Relative: 1 %
HCT: 44.2 % (ref 39.0–52.0)
Hemoglobin: 14.3 g/dL (ref 13.0–17.0)
Immature Granulocytes: 0 %
Lymphocytes Relative: 13 %
Lymphs Abs: 0.9 10*3/uL (ref 0.7–4.0)
MCH: 32.1 pg (ref 26.0–34.0)
MCHC: 32.4 g/dL (ref 30.0–36.0)
MCV: 99.1 fL (ref 80.0–100.0)
Monocytes Absolute: 0.5 10*3/uL (ref 0.1–1.0)
Monocytes Relative: 7 %
Neutro Abs: 5.4 10*3/uL (ref 1.7–7.7)
Neutrophils Relative %: 78 %
Platelet Count: 221 10*3/uL (ref 150–400)
RBC: 4.46 MIL/uL (ref 4.22–5.81)
RDW: 13.3 % (ref 11.5–15.5)
WBC Count: 6.9 10*3/uL (ref 4.0–10.5)
nRBC: 0 % (ref 0.0–0.2)

## 2019-07-13 LAB — CMP (CANCER CENTER ONLY)
ALT: 9 U/L (ref 0–44)
AST: 16 U/L (ref 15–41)
Albumin: 3.7 g/dL (ref 3.5–5.0)
Alkaline Phosphatase: 79 U/L (ref 38–126)
Anion gap: 7 (ref 5–15)
BUN: 46 mg/dL — ABNORMAL HIGH (ref 8–23)
CO2: 26 mmol/L (ref 22–32)
Calcium: 9.9 mg/dL (ref 8.9–10.3)
Chloride: 109 mmol/L (ref 98–111)
Creatinine: 2.83 mg/dL — ABNORMAL HIGH (ref 0.61–1.24)
GFR, Est AFR Am: 24 mL/min — ABNORMAL LOW (ref >60)
GFR, Estimated: 21 mL/min — ABNORMAL LOW (ref >60)
Glucose, Bld: 97 mg/dL (ref 70–99)
Potassium: 5.4 mmol/L — ABNORMAL HIGH (ref 3.5–5.1)
Sodium: 142 mmol/L (ref 135–145)
Total Bilirubin: 0.4 mg/dL (ref 0.3–1.2)
Total Protein: 7.6 g/dL (ref 6.5–8.1)

## 2019-07-13 LAB — TSH: TSH: 4.595 u[IU]/mL — ABNORMAL HIGH (ref 0.320–4.118)

## 2019-07-13 MED ORDER — SODIUM CHLORIDE 0.9 % IV SOLN
Freq: Once | INTRAVENOUS | Status: AC
  Administered 2019-07-13: 14:00:00 via INTRAVENOUS
  Filled 2019-07-13: qty 250

## 2019-07-13 MED ORDER — SODIUM CHLORIDE 0.9 % IV SOLN
1200.0000 mg | Freq: Once | INTRAVENOUS | Status: AC
  Administered 2019-07-13: 14:00:00 1200 mg via INTRAVENOUS
  Filled 2019-07-13: qty 20

## 2019-07-13 NOTE — Telephone Encounter (Signed)
Dtr said pt is 7 mins away . She did not know his appt was earlier.

## 2019-07-13 NOTE — Patient Instructions (Signed)
[facility] Discharge Instructions for Patients Receiving Chemotherapy  Today you received the following chemotherapy agents: Tecentriq  To help prevent nausea and vomiting after your treatment, we encourage you to take your nausea medication as directed.   If you develop nausea and vomiting that is not controlled by your nausea medication, call the clinic.   BELOW ARE SYMPTOMS THAT SHOULD BE REPORTED IMMEDIATELY:  *FEVER GREATER THAN 100.5 F  *CHILLS WITH OR WITHOUT FEVER  NAUSEA AND VOMITING THAT IS NOT CONTROLLED WITH YOUR NAUSEA MEDICATION  *UNUSUAL SHORTNESS OF BREATH  *UNUSUAL BRUISING OR BLEEDING  TENDERNESS IN MOUTH AND THROAT WITH OR WITHOUT PRESENCE OF ULCERS  *URINARY PROBLEMS  *BOWEL PROBLEMS  UNUSUAL RASH Items with * indicate a potential emergency and should be followed up as soon as possible.  Feel free to call the clinic should you have any questions or concerns. The clinic phone number is [phone].  Please show the CHEMO ALERT CARD at check-in to the Emergency Department and triage nurse.   

## 2019-07-13 NOTE — Progress Notes (Signed)
[facility] [phone]   [phone]  OFFICE PROGRESS NOTE  Patient, No Pcp Per No address on file  DIAGNOSIS: Extensive stage (T4, N2, M1a) small cell lung cancer presented with large right perihilar mass with mediastinal invasion and loculated malignant right pleural effusion diagnosed in June 2019.  PRIOR THERAPY:None.  CURRENT THERAPY: Systemic chemotherapy with carboplatin for AUC of 5 on day 1, etoposide 100 mg/M2 on days 1, 2 and 3 in addition to Tecentriq 1200 mg IV every 3 weeks with Neulasta support.  Status post 21 cycles.  Starting from cycle #6 the patient will be treated with maintenance single agent Tecentriq every 3 weeks  INTERVAL HISTORY: Christopher Wilson 78 y.o. male returns to the clinic today for follow-up visit.  The patient is feeling fine today with no concerning complaints.  He denied having any chest pain, shortness of breath, cough or hemoptysis.  He denied having any fever or chills.  He has no nausea, vomiting, diarrhea or constipation.  He has no headache or visual changes.  The patient has no current weight loss.  He is here today for evaluation before starting cycle #22 of his treatment.  MEDICAL HISTORY: Past Medical History:  Diagnosis Date  . ACS (acute coronary syndrome), with ST elevation but stable coronary arteries on cath 06/27/12 06/27/2012  . CAD (coronary artery disease), with CABG 1989 and re-do Cabg 2002 X 3 vesssels.  progressive disease with Stent to Lt. Main, and 1st diag 2006, stent to VG to LCX 2007, with known occlusion history o  06/27/2012  . CKD (chronic kidney disease) stage 3, GFR 30-59 ml/min (HCC) 06/27/2012  . 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 Coronary artery disease   . GERD (gastroesophageal reflux disease)   . Hyperlipidemia LDL goal < 70 06/27/2012  . Hypertension   . Myocardial infarction ([place])   . Peptic ulcer   . STEMI (ST elevation myocardial infarction) ([place]) 06/27/2012  . Tobacco  abuse 06/27/2012    ALLERGIES:  has No Known Allergies.  MEDICATIONS:  Current Outpatient Medications  Medication Sig Dispense Refill  . acetaminophen (TYLENOL) 325 MG tablet Take 650 mg by mouth every 6 (six) hours as needed for moderate pain.    Marland Kitchen albuterol (PROVENTIL HFA;VENTOLIN HFA) 108 (90 Base) MCG/ACT inhaler Inhale 2 puffs into the lungs every 6 (six) hours as needed for wheezing or shortness of breath.    Marland Kitchen atorvastatin (LIPITOR) 80 MG tablet TAKE ONE TABLET BY MOUTH DAILY AT 6 P.M. NEEDS APPOINTMENT FOR FUTURE REFILLS. (Patient taking differently: Take 80 mg by mouth daily at 6 PM. ) 90 tablet 3  . levothyroxine (SYNTHROID) 125 MCG tablet Take 1 tablet (125 mcg total) by mouth daily before breakfast. 30 tablet 0  . metoprolol tartrate (LOPRESSOR) 25 MG tablet Take 1 tablet (25 mg total) by mouth 2 (two) times daily. OV NEEDED. 60 tablet 0  . nitroGLYCERIN (NITROSTAT) 0.4 MG SL tablet Place 1 tablet (0.4 mg total) under the tongue every 5 (five) minutes as needed for chest pain. 25 tablet 0  . prochlorperazine (COMPAZINE) 10 MG tablet Take 1 tablet (10 mg total) by mouth every 6 (six) hours as needed for nausea or vomiting. 30 tablet 0  . rivaroxaban (XARELTO) 20 MG TABS tablet Take 1 tablet (20 mg total) by mouth daily with supper. 90 tablet 1  . isosorbide mononitrate (IMDUR) 30 MG 24 hr tablet Take 1 tablet (30 mg total) by mouth daily. [place]  tablet 3   No current facility-administered medications for this visit.     SURGICAL HISTORY:  Past Surgical History:  Procedure Laterality Date  . coratid arterty surgery    . CORONARY ANGIOPLASTY WITH STENT PLACEMENT    . CORONARY ARTERY BYPASS GRAFT    . IR GUIDED DRAIN W CATHETER PLACEMENT  04/08/2018  . IR REMOVAL OF PLURAL CATH W/CUFF  06/19/2018  . LCEA    . LEFT HEART CATH N/A 06/27/2012   Procedure: LEFT HEART CATH;  Surgeon: Troy Sine, MD;  Location: [facility] CATH LAB;  Service: Cardiovascular;  Laterality: N/A;  . triple bipass      Hx of CABG and RE do  . VASCULAR SURGERY    . VIDEO BRONCHOSCOPY Bilateral 04/07/2018   Procedure: VIDEO BRONCHOSCOPY WITHOUT FLUORO;  Surgeon: Rush Farmer, MD;  Location: [facility] ENDOSCOPY;  Service: Cardiopulmonary;  Laterality: Bilateral;    REVIEW OF SYSTEMS:  A comprehensive review of systems was negative except for: Constitutional: positive for fatigue   PHYSICAL EXAMINATION: General appearance: alert, cooperative and no distress Head: Normocephalic, without obvious abnormality, atraumatic Neck: no adenopathy, no JVD, supple, symmetrical, trachea midline and thyroid not enlarged, symmetric, no tenderness/mass/nodules Lymph nodes: Cervical, supraclavicular, and axillary nodes normal. Resp: clear to auscultation bilaterally Back: symmetric, no curvature. ROM normal. No CVA tenderness. Cardio: regular rate and rhythm, S1, S2 normal, no murmur, click, rub or gallop GI: soft, non-tender; bowel sounds normal; no masses,  no organomegaly Extremities: extremities normal, atraumatic, no cyanosis or edema  ECOG PERFORMANCE STATUS: 1 - Symptomatic but completely ambulatory  Blood pressure 126/78, pulse 69, temperature 99.1 F (37.3 C), temperature source Oral, resp. rate 17, height 5\' 10"  (1.778 m), weight 153 lb 3.2 oz (69.5 kg), SpO2 95 %.  LABORATORY DATA: Lab Results  Component Value Date   WBC 6.9 07/13/2019   HGB 14.3 07/13/2019   HCT 44.2 07/13/2019   MCV 99.1 07/13/2019   PLT 221 07/13/2019      Chemistry      Component Value Date/Time   NA 142 07/13/2019 1227   NA 140 03/21/2018 1048   K 5.4 (H) 07/13/2019 1227   CL 109 07/13/2019 1227   CO2 26 07/13/2019 1227   BUN 46 (H) 07/13/2019 1227   BUN 45 (H) 03/21/2018 1048   CREATININE 2.83 (H) 07/13/2019 1227   CREATININE 1.80 (H) 09/06/2014 1033      Component Value Date/Time   CALCIUM 9.9 07/13/2019 1227   ALKPHOS 79 07/13/2019 1227   AST 16 07/13/2019 1227   ALT 9 07/13/2019 1227   BILITOT 0.4 07/13/2019 1227        RADIOGRAPHIC STUDIES: No results found.  ASSESSMENT AND PLAN: This is a very pleasant 78 years old white male recently diagnosed with extensive stage small cell lung cancer and currently undergoing systemic chemotherapy with carboplatin, etoposide and Tecentriq status post 5 cycles. He is also status post 16 cycle of maintenance treatment with single agent Tecentriq.   The patient has been tolerating this treatment well with no concerning adverse effects. I recommended for him to proceed with cycle #22 today as planned. I will see him back for follow-up visit in 3 weeks for evaluation after repeating CT scan of the chest, abdomen pelvis for restaging of his disease. The patient was advised to call immediately if he has any concerning symptoms in the interval. The patient voices understanding of current disease status and treatment options and is in agreement with the current  care plan.  All questions were answered. The patient knows to call the clinic with any problems, questions or concerns. We can certainly see the patient much sooner if necessary.  Disclaimer: This note was dictated with voice recognition software. Similar sounding words can inadvertently be transcribed and may not be corrected upon review.

## 2019-07-13 NOTE — Telephone Encounter (Signed)
Scheduled appt per 9/14 los - pt to get an updated schedule in treatment area .

## 2019-07-13 NOTE — Progress Notes (Signed)
Ok to treat with elevated SCr per Dr. Julien Nordmann.

## 2019-07-22 ENCOUNTER — Other Ambulatory Visit: Payer: Self-pay | Admitting: *Deleted

## 2019-07-22 ENCOUNTER — Other Ambulatory Visit: Payer: Self-pay | Admitting: Internal Medicine

## 2019-07-22 DIAGNOSIS — R7989 Other specified abnormal findings of blood chemistry: Secondary | ICD-10-CM

## 2019-07-22 MED ORDER — LEVOTHYROXINE SODIUM 125 MCG PO TABS: 125.0000 ug | ORAL_TABLET | Freq: Every day | ORAL | 0 refills | Status: DC

## 2019-07-23 ENCOUNTER — Telehealth (INDEPENDENT_AMBULATORY_CARE_PROVIDER_SITE_OTHER): Payer: Medicare HMO | Admitting: Cardiovascular Disease

## 2019-07-23 ENCOUNTER — Encounter: Payer: Self-pay | Admitting: Cardiovascular Disease

## 2019-07-23 VITALS — Ht 70.0 in

## 2019-07-23 DIAGNOSIS — N184 Chronic kidney disease, stage 4 (severe): Secondary | ICD-10-CM | POA: Diagnosis not present

## 2019-07-23 DIAGNOSIS — Z7901 Long term (current) use of anticoagulants: Secondary | ICD-10-CM | POA: Diagnosis not present

## 2019-07-23 DIAGNOSIS — I2510 Atherosclerotic heart disease of native coronary artery without angina pectoris: Secondary | ICD-10-CM | POA: Diagnosis not present

## 2019-07-23 DIAGNOSIS — Z720 Tobacco use: Secondary | ICD-10-CM | POA: Diagnosis not present

## 2019-07-23 DIAGNOSIS — I10 Essential (primary) hypertension: Secondary | ICD-10-CM | POA: Diagnosis not present

## 2019-07-23 DIAGNOSIS — E785 Hyperlipidemia, unspecified: Secondary | ICD-10-CM

## 2019-07-23 DIAGNOSIS — C3490 Malignant neoplasm of unspecified part of unspecified bronchus or lung: Secondary | ICD-10-CM | POA: Diagnosis not present

## 2019-07-23 DIAGNOSIS — Z951 Presence of aortocoronary bypass graft: Secondary | ICD-10-CM | POA: Diagnosis not present

## 2019-07-23 NOTE — Progress Notes (Signed)
Virtual Visit via Telephone Note   This visit type was conducted due to national recommendations for restrictions regarding the COVID-19 Pandemic (e.g. social distancing) in an effort to limit this patient's exposure and mitigate transmission in our community.  Due to his co-morbid illnesses, this patient is at least at moderate risk for complications without adequate follow up.  This format is felt to be most appropriate for this patient at this time.  The patient did not have access to video technology/had technical difficulties with video requiring transitioning to audio format only (telephone).  All issues noted in this document were discussed and addressed.  No physical exam could be performed with this format.  Please refer to the patient's chart for his  consent to telehealth for [facility]   Date:  07/23/2019   ID:  Christopher Wilson, DOB 08-Aug-1941, MRN [id number]  Patient Location: Home Provider Location: Home  PCP:  Patient, No Pcp Per  Cardiologist:  Shelva Majestic, MD  Electrophysiologist:  None   Evaluation Performed:  Follow-Up Visit  Chief Complaint: 5-month follow-up evaluation  History of Present Illness:    Christopher Wilson is a 78 y.o. male who is a 78 year old former patient of Dr. Rollene Fare. He has a history of hypertension, and CAD. In 1989 he underwent CABG revascularization surgery at [facility]. In 1999 he underwent bare-metal stenting to his proximal RCA.  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 Nils Pyle.   In 2006 he underwent stenting of his LM and diagonal and a 2007 stenting of the vein graft to the circumflex vessel. Subsequent catheterization showed total occlusion of the vein graft. Catheterization in 2010 showed an ejection fraction of 35-40%. His last catheterization was in August 2013 when he presented with unstable angina/ACS with transient ST changes. He was found to have significant native CAD with a patent left main  is stent with mild ostial 20% in-stent narrowing, a patent stent in the diagonal vessel of the LAD with previously documented old occlusion of the LAD after the diagonal vessel and occlusion of the native circumflex and total occlusion of the ostial RCA. There was old occlusion of the vein graft it previously supplied the circumflex. A widely patent vein graft supplying the RCA and a widely patent LIMA graft supplying the LAD. Aggressive medical therapy was recommended. He has a solitary kidney.  He has continued to smoke and is smoking one pack per day.  He is status post carotid surgery and a carotid study done in October 2014 showed patent left carotid endarterectomy. 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   When I saw him in September 2016 he continued to work as a Administrator and unfortunately continued to smoke cigarettes at least one pack per day.  He underwent a/nuclear stress test in September 2016 which was necessary for his DOT physical and this revealed a large moderate intensity fixed inferolateral perfusion defect consistent with scar without associated ischemia.  EF was 53% and there was inferolateral akinesis. He had normal exercise capacity without chest pain.  His last carotid studies were done at the same time and showed heterogeneous plaque bilaterally with stable 40-59% bilateral internal carotid artery stenoses and greater than 50% right external carotid stenosis.  I saw him in March 2018 and as part of his DOT clearance a 2-year nuclear study was stable without significant change from his prior evaluation and demonstrated his old prior inferior infarct  without ischemia.    Beginning in September 2018 he began to lose weight.  In September 2018 he weighed 182 pounds and his weight has dropped to 160 pounds. I last saw him in May 2019.  At that time he was having GI issues, was feeling exhausted, admitted to increasing shortness of breath, was not eating and was not hungry.  He was  subsequently found to have extensive stage (T4, N2, M1a) small cell lung cancer and presented with large right perihilar mass with mediastinal invasion and loculated malignant right pleural effusion diagnosed in June 2019.  Since that time he has been undergoing systemic chemotherapy with carboplatin, etoposide and Tecentriq status post 5 cycles. He is also status post 17 cycle of maintenance treatment with single agent Tecentriq.    Presently, he denies any chest pain.  He believes he is tolerating most of the chemotherapies but continues to admit to fatigability.  He has been started on anticoagulation therapy secondary to a DVT.  He has not had recent lipid studies done.  He is no longer working.  He admits that he still smokes intermittently.  He presents for evaluation.  The patient does not have symptoms concerning for COVID-19 infection (fever, chills, cough, or new shortness of breath).    Past Medical History:  Diagnosis Date   ACS (acute coronary syndrome), with ST elevation but stable coronary arteries on cath 06/27/12 06/27/2012   CAD (coronary artery disease), with CABG 1989 and re-do Cabg 2002 X 3 vesssels.  progressive disease with Stent to Lt. Main, and 1st diag 2006, stent to VG to LCX 2007, with known occlusion history o  06/27/2012   CKD (chronic kidney disease) stage 3, GFR 30-59 ml/min (HCC) 06/27/2012   Congenital single kidney 06/27/2012   Coronary artery disease    GERD (gastroesophageal reflux disease)    Hyperlipidemia LDL goal < 70 06/27/2012   Hypertension    Myocardial infarction [facility])    Peptic ulcer    STEMI (ST elevation myocardial infarction) ([place]) 06/27/2012   Tobacco abuse 06/27/2012   Past Surgical History:  Procedure Laterality Date   coratid arterty surgery     CORONARY ANGIOPLASTY WITH STENT PLACEMENT     CORONARY ARTERY BYPASS GRAFT     IR GUIDED DRAIN W CATHETER PLACEMENT  04/08/2018   IR REMOVAL OF PLURAL CATH W/CUFF  06/19/2018   LCEA      LEFT HEART CATH N/A 06/27/2012   Procedure: LEFT HEART CATH;  Surgeon: Troy Sine, MD;  Location: [facility] CATH LAB;  Service: Cardiovascular;  Laterality: N/A;   triple bipass     Hx of CABG and RE do   VASCULAR SURGERY     VIDEO BRONCHOSCOPY Bilateral 04/07/2018   Procedure: VIDEO BRONCHOSCOPY WITHOUT FLUORO;  Surgeon: Rush Farmer, MD;  [facility] END[facility]COPY;  Service: Cardiopulmonary;  Laterality: Bilateral;     Current Meds  Medication Sig   acetaminophen (TYLENOL) 325 MG tablet Take 650 mg by mouth every 6 (six) hours as needed for moderate pain.   albuterol (PROVENTIL HFA;VENTOLIN HFA) 108 (90 Base) MCG/ACT inhaler Inhale 2 puffs into the lungs every 6 (six) hours as needed for wheezing or shortness of breath.   atorvastatin (LIPITOR) 80 MG tablet TAKE ONE TABLET BY MOUTH DAILY AT 6 P.M. NEEDS APPOINTMENT FOR FUTURE REFILLS. (Patient taking differently: Take 80 mg by mouth daily at 6 PM. )   levothyroxine (SYNTHROID) 125 MCG tablet Take 1 tablet (125 mcg total) by mouth daily before  breakfast.   metoprolol tartrate (LOPRESSOR) 25 MG tablet Take 1 tablet (25 mg total) by mouth 2 (two) times daily. OV NEEDED.   nitroGLYCERIN (NITROSTAT) 0.4 MG SL tablet Place 1 tablet (0.4 mg total) under the tongue every 5 (five) minutes as needed for chest pain.   prochlorperazine (COMPAZINE) 10 MG tablet Take 1 tablet (10 mg total) by mouth every 6 (six) hours as needed for nausea or vomiting.   rivaroxaban (XARELTO) 20 MG TABS tablet Take 1 tablet (20 mg total) by mouth daily with supper.     Allergies:   Patient has no known allergies.   Social History   Tobacco Use   Smoking status: Current Every Day Smoker    Packs/day: 1.50   Smokeless tobacco: Never Used  Substance Use Topics   Alcohol use: Yes    Alcohol/week: 10.0 standard drinks    Types: 10 Cans of beer per week   Drug use: No     Family Hx: The patient's family history includes Heart attack in his father;  Stroke in his maternal aunt and mother.  ROS:   Please see the history of present illness.    He denies any recent fevers chills or night sweats Continued intermittent tobacco use Positive for fatigue Intermittent wheezing No anginal symptoms Occasional shortness of breath with activity Remote DVT No bleeding No edema Kidney insufficiency  All other systems reviewed and are negative.   Prior CV studies:   The following studies were reviewed today:  Carotid duplex July 17, 2017: Stable mild plaque  Gated SPECT Myoview study July 17, 2017: Stable without significant change from 2-year previous evaluation with old prior infarct without ischemia  Labs/Other Tests and Data Reviewed:    EKG:    Recent Labs: 07/26/2018: B Natriuretic Peptide 890.1 07/30/2018: Magnesium 1.8 07/13/2019: ALT 9; BUN 46; Creatinine 2.83; Hemoglobin 14.3; Platelet Count 221; Potassium 5.4; Sodium 142; TSH 4.595   Recent Lipid Panel Lab Results  Component Value Date/Time   CHOL 116 04/01/2018 02:56 AM   CHOL 171 03/21/2018 10:48 AM   TRIG 84 03/21/2018 10:48 AM   HDL 41 03/21/2018 10:48 AM   CHOLHDL 4.2 03/21/2018 10:48 AM   CHOLHDL 3.6 09/06/2014 10:33 AM   LDLCALC 113 (H) 03/21/2018 10:48 AM    Wt Readings from Last 3 Encounters:  07/13/19 153 lb 3.2 oz (69.5 kg)  06/22/19 153 lb 6.4 oz (69.6 kg)  06/01/19 157 lb 6.4 oz (71.4 kg)     Objective:    Vital Signs:  Ht 5\' 10"  (1.778 m)    BMI 21.98 kg/m    Since this was a telemedicine evaluation I could not physically examine the patient.  However, I reviewed vital signs from his most recent office visit of September 14 with Dr. Earlie Server.  Blood pressure was 126/78 and pulse was 69  His breathing was unlabored Speech was soft There was no audible wheezing He denied any awareness of palpitations when checking his pulse There was no chest wall tenderness He denied abdominal discomfort There was no swelling in his legs He  denied any neurologic issues Psychologically he can't wait to complete his cancer treatments.   ASSESSMENT & PLAN:    1. CAD/CABG: At present he is not having any anginal symptomatology.  Extensive cardiovascular history was reviewed, status post initial CABG at [facility] in 1989, RCA stenting in 1999; MI requiring intra-aortic balloon pump support and redo CABG revascularization surgery in 2002, status post numerous interventions since as outlined  above.  Nuclear studies in 2016 and 2018 remained stable demonstrating old scar without associated ischemia. 2. Essential hypertension: Blood pressure when last by Dr. Earlie Server was stable.  He continues to be on metoprolol. 3. Hyperlipidemia: Continues to be on atorvastatin 80 mg.  Has not had recent lipid panel since May 2019 at which time LDL was 117.  Will obtain follow-up fasting laboratory. 4. Extensive small cell CA of his lung, status post 22 cycles of chemotherapy, followed closely by Dr. Earlie Server.  Scheduled to undergo repeat imaging on July 31, 2019 with CT of his chest abdomen and pelvis. 5. Tobacco abuse: Longstanding history, unfortunately still admits to intermittently smoking. 6. Hypothyroidism: On levothyroxine 7. Stage IV chronic kidney disease:laboratory from July 13, 2019 reveals creatinine 2.83; K 5.4.  On 06/01/2019, creatinine was 2.47.  The patient states he does not see a nephrologist.  We will follow-up laboratory, renal evaluation may be necessary. 8. Anticoagulated: Apparently he had a DVT  since I last saw him after he was diagnosed with his lung cancer and has been on Xarelto.   COVID-19 Education: The signs and symptoms of COVID-19 were discussed with the patient and how to seek care for testing (follow up with PCP or arrange E-visit).  The importance of social distancing was discussed today.  Time:   Today, I have spent 25 minutes with the patient with telehealth technology discussing the above problems.     Medication  Adjustments/Labs and Tests Ordered: Current medicines are reviewed at length with the patient today.  Concerns regarding medicines are outlined above.   Tests Ordered: No orders of the defined types were placed in this encounter.   Medication Changes: No orders of the defined types were placed in this encounter.   Follow Up: Comprehensive metabolic panel lipid panel fasting.  Follow-up 6 months in office.   Signed, Shelva Majestic, MD  07/23/2019 8:51 AM    [place]

## 2019-07-23 NOTE — Patient Instructions (Signed)
Medication Instructions:  The current medical regimen is effective;  continue present plan and medications.  If you need a refill on your cardiac medications before your next appointment, please call your pharmacy.   Lab work: CMET, LIPID  Attached are the lab orders that are needed before your upcoming appointment, please come in anytime to have your labs drawn.   They are fasting labs, so nothing to eat or drink after midnight.  Lab hours: 8:00-4:00 lunch hours 12:45-1:45  If you have labs (blood work) drawn today and your tests are completely normal, you will receive your results only by: Marland Kitchen MyChart Message (if you have MyChart) OR . A paper copy in the mail If you have any lab test that is abnormal or we need to change your treatment, we will call you to review the results.  Follow-Up: At [facility], you and your health needs are our priority.  As part of our continuing mission to provide you with exceptional heart care, we have created designated Provider Care Teams.  These Care Teams include your primary Cardiologist (physician) and Advanced Practice Providers (APPs -  Physician Assistants and Nurse Practitioners) who all work together to provide you with the care you need, when you need it. You will need a follow up appointment in 6 months (in office).  Please call our office 2 months in advance to schedule this appointment.  You may see Shelva Majestic, MD or one of the following Advanced Practice Providers on your designated Care Team: [place], [place] . Fabian Sharp, PA-C

## 2019-07-28 ENCOUNTER — Other Ambulatory Visit: Payer: Self-pay | Admitting: Medical Oncology

## 2019-07-28 DIAGNOSIS — C3411 Malignant neoplasm of upper lobe, right bronchus or lung: Secondary | ICD-10-CM

## 2019-07-31 ENCOUNTER — Ambulatory Visit (HOSPITAL_COMMUNITY): Admission: RE | Admit: 2019-07-31 | Payer: Medicare HMO | Source: Ambulatory Visit

## 2019-07-31 ENCOUNTER — Ambulatory Visit (HOSPITAL_COMMUNITY)
Admission: RE | Admit: 2019-07-31 | Discharge: 2019-07-31 | Disposition: A | Discharge status: Home / Self Care | Payer: Medicare HMO | Source: Ambulatory Visit | Attending: Internal Medicine | Admitting: Internal Medicine

## 2019-07-31 ENCOUNTER — Other Ambulatory Visit: Payer: Self-pay

## 2019-07-31 DIAGNOSIS — C3411 Malignant neoplasm of upper lobe, right bronchus or lung: Secondary | ICD-10-CM | POA: Diagnosis not present

## 2019-07-31 DIAGNOSIS — C3491 Malignant neoplasm of unspecified part of right bronchus or lung: Secondary | ICD-10-CM | POA: Diagnosis not present

## 2019-08-03 ENCOUNTER — Ambulatory Visit: Payer: Medicare HMO

## 2019-08-03 ENCOUNTER — Ambulatory Visit: Payer: Medicare HMO | Admitting: Internal Medicine

## 2019-08-03 ENCOUNTER — Inpatient Hospital Stay: Payer: Medicare HMO

## 2019-08-03 ENCOUNTER — Inpatient Hospital Stay: Payer: Medicare HMO | Attending: Internal Medicine

## 2019-08-03 ENCOUNTER — Other Ambulatory Visit: Payer: Medicare HMO

## 2019-08-03 ENCOUNTER — Encounter: Payer: Self-pay | Admitting: Internal Medicine

## 2019-08-03 ENCOUNTER — Other Ambulatory Visit: Payer: Self-pay

## 2019-08-03 ENCOUNTER — Inpatient Hospital Stay (HOSPITAL_BASED_OUTPATIENT_CLINIC_OR_DEPARTMENT_OTHER): Payer: Medicare HMO | Admitting: Internal Medicine

## 2019-08-03 VITALS — BP 105/95 | HR 76 | Temp 98.9°F | Resp 18 | Ht 70.0 in | Wt 154.0 lb

## 2019-08-03 DIAGNOSIS — C342 Malignant neoplasm of middle lobe, bronchus or lung: Secondary | ICD-10-CM | POA: Diagnosis not present

## 2019-08-03 DIAGNOSIS — Z5112 Encounter for antineoplastic immunotherapy: Secondary | ICD-10-CM | POA: Insufficient documentation

## 2019-08-03 DIAGNOSIS — C3411 Malignant neoplasm of upper lobe, right bronchus or lung: Secondary | ICD-10-CM

## 2019-08-03 DIAGNOSIS — C3490 Malignant neoplasm of unspecified part of unspecified bronchus or lung: Secondary | ICD-10-CM

## 2019-08-03 DIAGNOSIS — Z79899 Other long term (current) drug therapy: Secondary | ICD-10-CM | POA: Insufficient documentation

## 2019-08-03 DIAGNOSIS — I10 Essential (primary) hypertension: Secondary | ICD-10-CM

## 2019-08-03 DIAGNOSIS — Z23 Encounter for immunization: Secondary | ICD-10-CM | POA: Diagnosis not present

## 2019-08-03 LAB — CMP (CANCER CENTER ONLY)
ALT: 15 U/L (ref 0–44)
AST: 20 U/L (ref 15–41)
Albumin: 3.5 g/dL (ref 3.5–5.0)
Alkaline Phosphatase: 75 U/L (ref 38–126)
Anion gap: 9 (ref 5–15)
BUN: 42 mg/dL — ABNORMAL HIGH (ref 8–23)
CO2: 22 mmol/L (ref 22–32)
Calcium: 9.9 mg/dL (ref 8.9–10.3)
Chloride: 110 mmol/L (ref 98–111)
Creatinine: 2.59 mg/dL — ABNORMAL HIGH (ref 0.61–1.24)
GFR, Est AFR Am: 27 mL/min — ABNORMAL LOW (ref >60)
GFR, Estimated: 23 mL/min — ABNORMAL LOW (ref >60)
Glucose, Bld: 94 mg/dL (ref 70–99)
Potassium: 4.8 mmol/L (ref 3.5–5.1)
Sodium: 141 mmol/L (ref 135–145)
Total Bilirubin: 0.5 mg/dL (ref 0.3–1.2)
Total Protein: 7.2 g/dL (ref 6.5–8.1)

## 2019-08-03 LAB — CBC WITH DIFFERENTIAL (CANCER CENTER ONLY)
Abs Immature Granulocytes: 0.03 10*3/uL (ref 0.00–0.07)
Basophils Absolute: 0.1 10*3/uL (ref 0.0–0.1)
Basophils Relative: 1 %
Eosinophils Absolute: 0.1 10*3/uL (ref 0.0–0.5)
Eosinophils Relative: 2 %
HCT: 40.4 % (ref 39.0–52.0)
Hemoglobin: 13.2 g/dL (ref 13.0–17.0)
Immature Granulocytes: 1 %
Lymphocytes Relative: 18 %
Lymphs Abs: 1.2 10*3/uL (ref 0.7–4.0)
MCH: 32 pg (ref 26.0–34.0)
MCHC: 32.7 g/dL (ref 30.0–36.0)
MCV: 98.1 fL (ref 80.0–100.0)
Monocytes Absolute: 0.5 10*3/uL (ref 0.1–1.0)
Monocytes Relative: 8 %
Neutro Abs: 4.6 10*3/uL (ref 1.7–7.7)
Neutrophils Relative %: 70 %
Platelet Count: 214 10*3/uL (ref 150–400)
RBC: 4.12 MIL/uL — ABNORMAL LOW (ref 4.22–5.81)
RDW: 13.1 % (ref 11.5–15.5)
WBC Count: 6.6 10*3/uL (ref 4.0–10.5)
nRBC: 0 % (ref 0.0–0.2)

## 2019-08-03 MED ORDER — SODIUM CHLORIDE 0.9 % IV SOLN
1200.0000 mg | Freq: Once | INTRAVENOUS | Status: AC
  Administered 2019-08-03: 1200 mg via INTRAVENOUS
  Filled 2019-08-03: qty 20

## 2019-08-03 MED ORDER — INFLUENZA VAC A&B SA ADJ QUAD 0.5 ML IM PRSY: 0.5000 mL | PREFILLED_SYRINGE | Freq: Once | INTRAMUSCULAR | Status: DC

## 2019-08-03 MED ORDER — SODIUM CHLORIDE 0.9 % IV SOLN
Freq: Once | INTRAVENOUS | Status: AC
  Administered 2019-08-03: 16:00:00 via INTRAVENOUS
  Filled 2019-08-03: qty 250

## 2019-08-03 NOTE — Patient Instructions (Signed)
[facility] Discharge Instructions for Patients Receiving Chemotherapy  Today you received the following chemotherapy agents: Tecentriq  To help prevent nausea and vomiting after your treatment, we encourage you to take your nausea medication as directed.   If you develop nausea and vomiting that is not controlled by your nausea medication, call the clinic.   BELOW ARE SYMPTOMS THAT SHOULD BE REPORTED IMMEDIATELY:  *FEVER GREATER THAN 100.5 F  *CHILLS WITH OR WITHOUT FEVER  NAUSEA AND VOMITING THAT IS NOT CONTROLLED WITH YOUR NAUSEA MEDICATION  *UNUSUAL SHORTNESS OF BREATH  *UNUSUAL BRUISING OR BLEEDING  TENDERNESS IN MOUTH AND THROAT WITH OR WITHOUT PRESENCE OF ULCERS  *URINARY PROBLEMS  *BOWEL PROBLEMS  UNUSUAL RASH Items with * indicate a potential emergency and should be followed up as soon as possible.  Feel free to call the clinic should you have any questions or concerns. The clinic phone number is [phone].  Please show the CHEMO ALERT CARD at check-in to the Emergency Department and triage nurse.   

## 2019-08-03 NOTE — Progress Notes (Signed)
Okay to treat today with Creat. 2.59, per Dr. Julien Nordmann.

## 2019-08-03 NOTE — Progress Notes (Signed)
[place] [phone]   [phone]  OFFICE PROGRESS NOTE  Patient, No Pcp Per No address on file  DIAGNOSIS: Extensive stage (T4, N2, M1a) small cell lung cancer presented with large right perihilar mass with mediastinal invasion and loculated malignant right pleural effusion diagnosed in June 2019.  PRIOR THERAPY:None.  CURRENT THERAPY: Systemic chemotherapy with carboplatin for AUC of 5 on day 1, etoposide 100 mg/M2 on days 1, 2 and 3 in addition to Tecentriq 1200 mg IV every 3 weeks with Neulasta support.  Status post 22 cycles.  Starting from cycle #6 the patient will be treated with maintenance single agent Tecentriq every 3 weeks  INTERVAL HISTORY: Christopher Wilson 78 y.o. male returns to the clinic today for follow-up visit.  The patient is feeling fine today with no concerning complaints except for mild fatigue.  He denied having any recent weight loss or night sweats.  He has no nausea, vomiting, diarrhea or constipation.  He denied having any headache or visual changes.  He has no chest pain, shortness of breath, cough or hemoptysis.  He continues to tolerate his treatment with Tecentriq fairly well.  The patient had repeat CT scan of the chest, abdomen pelvis performed recently and he is here today for evaluation and discussion of his scan results.  MEDICAL HISTORY: Past Medical History:  Diagnosis Date   ACS (acute coronary syndrome), with ST elevation but stable coronary arteries on cath 06/27/12 06/27/2012   CAD (coronary artery disease), with CABG 1989 and re-do Cabg 2002 X 3 vesssels.  progressive disease with Stent to Lt. 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  MEDICATIONS:  Current Outpatient Medications  Medication Sig Dispense Refill   acetaminophen (TYLENOL) 325 MG tablet Take 650 mg by mouth every 6 (six) hours as needed for moderate pain.     albuterol (PROVENTIL HFA;VENTOLIN HFA) 108 (90 Base) MCG/ACT inhaler Inhale 2 puffs into the lungs every 6 (six) hours as needed for wheezing or shortness of breath.     atorvastatin (LIPITOR) 80 MG tablet TAKE ONE TABLET BY MOUTH DAILY AT 6 P.M. NEEDS APPOINTMENT FOR FUTURE REFILLS. (Patient taking differently: Take 80 mg by mouth daily at 6 PM. ) 90 tablet 3   isosorbide mononitrate (IMDUR) 30 MG 24 hr tablet Take 1 tablet (30 mg total) by mouth daily. 90 tablet 3   levothyroxine (SYNTHROID) 125 MCG tablet Take 1 tablet (125 mcg total) by mouth daily before breakfast. 30 tablet 0   metoprolol tartrate (LOPRESSOR) 25 MG tablet Take 1 tablet (25 mg total) by mouth 2 (two) times daily. OV NEEDED. 60 tablet 0   nitroGLYCERIN (NITROSTAT) 0.4 MG SL tablet Place 1 tablet (0.4 mg total) under the tongue every 5 (five) minutes as needed for chest pain. 25 tablet 0   prochlorperazine (COMPAZINE) 10 MG tablet Take 1 tablet (10 mg total) by mouth every 6 (six) hours as needed for nausea or  vomiting. 30 tablet 0   rivaroxaban (XARELTO) 20 MG TABS tablet Take 1 tablet (20 mg total) by mouth daily with supper. 90 tablet 1   No current facility-administered medications for this visit.     SURGICAL HISTORY:  Past Surgical History:  Procedure Laterality Date   coratid arterty surgery     CORONARY ANGIOPLASTY WITH STENT PLACEMENT     CORONARY ARTERY BYPASS GRAFT     IR GUIDED DRAIN W CATHETER PLACEMENT  04/08/2018   IR REMOVAL OF PLURAL CATH W/CUFF  06/19/2018   LCEA     LEFT HEART CATH N/A 06/27/2012    Procedure: LEFT HEART CATH;  Surgeon: Troy Sine, MD;  Location: [facility] CATH LAB;  Service: Cardiovascular;  Laterality: N/A;   triple bipass     Hx of CABG and RE do   VASCULAR SURGERY     VIDEO BRONCHOSCOPY Bilateral 04/07/2018   Procedure: VIDEO BRONCHOSCOPY WITHOUT FLUORO;  Surgeon: Rush Farmer, MD;  Location: [facility] ENDOSCOPY;  Service: Cardiopulmonary;  Laterality: Bilateral;    REVIEW OF SYSTEMS:  Constitutional: positive for fatigue Eyes: negative Ears, nose, mouth, throat, and face: negative Respiratory: negative Cardiovascular: negative Gastrointestinal: negative Genitourinary:negative Integument/breast: negative Hematologic/lymphatic: negative Musculoskeletal:positive for muscle weakness Neurological: negative Behavioral/Psych: negative Endocrine: negative Allergic/Immunologic: negative   PHYSICAL EXAMINATION: General appearance: alert, cooperative, fatigued and no distress Head: Normocephalic, without obvious abnormality, atraumatic Neck: no adenopathy, no JVD, supple, symmetrical, trachea midline and thyroid not enlarged, symmetric, no tenderness/mass/nodules Lymph nodes: Cervical, supraclavicular, and axillary nodes normal. Resp: clear to auscultation bilaterally Back: symmetric, no curvature. ROM normal. No CVA tenderness. Cardio: regular rate and rhythm, S1, S2 normal, no murmur, click, rub or gallop GI: soft, non-tender; bowel sounds normal; no masses,  no organomegaly Extremities: extremities normal, atraumatic, no cyanosis or edema Neurologic: Alert and oriented X 3, normal strength and tone. Normal symmetric reflexes. Normal coordination and gait  ECOG PERFORMANCE STATUS: 1 - Symptomatic but completely ambulatory  Blood pressure (!) 105/95, pulse 76, temperature 98.9 F (37.2 C), temperature source Temporal, resp. rate 18, height 5\' 10"  (1.778 m), weight 154 lb (69.9 kg), SpO2 97 %.  LABORATORY DATA: Lab Results  Component Value Date   WBC 6.9  07/13/2019   HGB 14.3 07/13/2019   HCT 44.2 07/13/2019   MCV 99.1 07/13/2019   PLT 221 07/13/2019      Chemistry      Component Value Date/Time   NA 142 07/13/2019 1227   NA 140 03/21/2018 1048   K 5.4 (H) 07/13/2019 1227   CL 109 07/13/2019 1227   CO2 26 07/13/2019 1227   BUN 46 (H) 07/13/2019 1227   BUN 45 (H) 03/21/2018 1048   CREATININE 2.83 (H) 07/13/2019 1227   CREATININE 1.80 (H) 09/06/2014 1033      Component Value Date/Time   CALCIUM 9.9 07/13/2019 1227   ALKPHOS 79 07/13/2019 1227   AST 16 07/13/2019 1227   ALT 9 07/13/2019 1227   BILITOT 0.4 07/13/2019 1227       RADIOGRAPHIC STUDIES: Ct Abdomen Pelvis Wo Contrast  Result Date: 08/01/2019 CLINICAL DATA:  Small cell lung cancer of the right upper lobe, restaging EXAM: CT CHEST, ABDOMEN AND PELVIS WITHOUT CONTRAST TECHNIQUE: Multidetector CT imaging of the chest, abdomen and pelvis was performed following the standard protocol without IV contrast. COMPARISON:  Multiple exams, including 05/08/2019 and 03/06/2019 FINDINGS: CT CHEST FINDINGS Cardiovascular: Coronary, aortic arch, and branch vessel atherosclerotic vascular disease. Prior CABG. Fatty density along the lateral  wall of the left ventricle likely from remote infarct. Tortuous thoracic aorta. Mediastinum/Nodes: Right lower paratracheal node 0.9 cm in short axis on image 27/2, stable. Lungs/Pleura: Biapical pleuroparenchymal scarring. This has a stable nodular component on the right A mass along the minor fissure measures 3.4 by 2.8 cm on image 73/4, previously 3.3 by 2.7 cm when measured in a similar fashion. Adjacent scarring or volume loss. There is frothy fluid in the bronchus intermedius along with airway thickening in the right middle lobe and right lower lobe bronchus with cylindrical bronchiectasis and airway plugging in the right lower lobe. Distal to this airway plugging there some patchy nodular and bandlike opacities in the posterior basal segment right  lower lobe which are increased previous. Centrilobular and paraseptal emphysema. Musculoskeletal: Prior median sternotomy for CABG. Thoracic spondylosis. CT ABDOMEN PELVIS FINDINGS Hepatobiliary: 1.1 by 0.8 cm hypodense lesion in segment 2 of the liver on image 50/2, stable. At least 2 additional small hypodense lesions in the liver are observed and appear stable as well. Gallbladder unremarkable. No biliary dilatation. Pancreas: Unremarkable Spleen: Unremarkable Adrenals/Urinary Tract: Adrenal glands unremarkable. Nonfunctional severely atrophic and multi-cystic right kidney. Exophytic lesions from the left kidney of varying complexity not appreciably changed from prior. Five left renal calculi are present, with one of the largest measuring 0.8 cm in long axis on image 47/5. No hydronephrosis or hydroureter. Urinary bladder unremarkable. Stomach/Bowel: Sigmoid colon diverticulosis. Otherwise unremarkable. Vascular/Lymphatic: Aortoiliac atherosclerotic vascular disease. Infrarenal abdominal aortic aneurysm, 3.7 cm in diameter, formerly 3.6 cm. No pathologic adenopathy identified. Reproductive: Dystrophic calcifications in the prostate gland. Other: No supplemental non-categorized findings. Musculoskeletal: Chronic appearing anterior wedging at L1. Degenerative retrolisthesis at L2-3 and L3-4 with degenerative anterolisthesis at L4-5. Lumbar spondylosis and degenerative disc disease causing multilevel impingement. Bridging spurring of the left sacroiliac joint. IMPRESSION: 1. The right upper lobe mass currently measures 3.4 by 2.8 cm, previously 3.3 by 2.7 cm by my measurements, roughly stable. 2. Stable biapical pleuroparenchymal scarring, right greater than left. 3. Aortic Atherosclerosis (ICD10-I70.0) and Emphysema (ICD10-J43.9). Coronary atherosclerosis with prior CABG. 4. Frothy fluid in the bronchus intermedius with airway plugging especially in the right lower lobe. Distal to this airway plugging in the  posterior basal segment there is increase in patchy nodular and bandlike opacity likely from postobstructive pneumonitis or atypical pneumonia. 5. Stable small hypodense liver lesions, technically nonspecific. 6. Stable small exophytic lesions of varying complexity of the left kidney, technically nonspecific. 7. Nonobstructive left nephrolithiasis. 8. Nonfunctional severely atrophic right kidney. 9. Infrarenal abdominal aortic aneurysm, 3.7 cm in diameter. Recommend followup by [place] in 2 years. This recommendation follows ACR consensus guidelines: White Paper of the ACR Incidental Findings Committee II on Vascular Findings. J Am Coll Radiol 2013; 10:789-794. Aortic aneurysm NOS (ICD10-I71.9). 10. Multilevel lumbar impingement. 11. Sigmoid colon diverticulosis. Electronically Signed   By: Van Clines M.D.   On: 08/01/2019 09:16   Ct Chest Wo Contrast  Result Date: 08/01/2019 CLINICAL DATA:  Small cell lung cancer of the right upper lobe, restaging EXAM: CT CHEST, ABDOMEN AND PELVIS WITHOUT CONTRAST TECHNIQUE: Multidetector CT imaging of the chest, abdomen and pelvis was performed following the standard protocol without IV contrast. COMPARISON:  Multiple exams, including 05/08/2019 and 03/06/2019 FINDINGS: CT CHEST FINDINGS Cardiovascular: Coronary, aortic arch, and branch vessel atherosclerotic vascular disease. Prior CABG. Fatty density along the lateral wall of the left ventricle likely from remote infarct. Tortuous thoracic aorta. Mediastinum/Nodes: Right lower paratracheal node 0.9 cm in short axis on image 27/2,  stable. Lungs/Pleura: Biapical pleuroparenchymal scarring. This has a stable nodular component on the right A mass along the minor fissure measures 3.4 by 2.8 cm on image 73/4, previously 3.3 by 2.7 cm when measured in a similar fashion. Adjacent scarring or volume loss. There is frothy fluid in the bronchus intermedius along with airway thickening in the right middle lobe and right lower lobe  bronchus with cylindrical bronchiectasis and airway plugging in the right lower lobe. Distal to this airway plugging there some patchy nodular and bandlike opacities in the posterior basal segment right lower lobe which are increased previous. Centrilobular and paraseptal emphysema. Musculoskeletal: Prior median sternotomy for CABG. Thoracic spondylosis. CT ABDOMEN PELVIS FINDINGS Hepatobiliary: 1.1 by 0.8 cm hypodense lesion in segment 2 of the liver on image 50/2, stable. At least 2 additional small hypodense lesions in the liver are observed and appear stable as well. Gallbladder unremarkable. No biliary dilatation. Pancreas: Unremarkable Spleen: Unremarkable Adrenals/Urinary Tract: Adrenal glands unremarkable. Nonfunctional severely atrophic and multi-cystic right kidney. Exophytic lesions from the left kidney of varying complexity not appreciably changed from prior. Five left renal calculi are present, with one of the largest measuring 0.8 cm in long axis on image 47/5. No hydronephrosis or hydroureter. Urinary bladder unremarkable. Stomach/Bowel: Sigmoid colon diverticulosis. Otherwise unremarkable. Vascular/Lymphatic: Aortoiliac atherosclerotic vascular disease. Infrarenal abdominal aortic aneurysm, 3.7 cm in diameter, formerly 3.6 cm. No pathologic adenopathy identified. Reproductive: Dystrophic calcifications in the prostate gland. Other: No supplemental non-categorized findings. Musculoskeletal: Chronic appearing anterior wedging at L1. Degenerative retrolisthesis at L2-3 and L3-4 with degenerative anterolisthesis at L4-5. Lumbar spondylosis and degenerative disc disease causing multilevel impingement. Bridging spurring of the left sacroiliac joint. IMPRESSION: 1. The right upper lobe mass currently measures 3.4 by 2.8 cm, previously 3.3 by 2.7 cm by my measurements, roughly stable. 2. Stable biapical pleuroparenchymal scarring, right greater than left. 3. Aortic Atherosclerosis (ICD10-I70.0) and Emphysema  (ICD10-J43.9). Coronary atherosclerosis with prior CABG. 4. Frothy fluid in the bronchus intermedius with airway plugging especially in the right lower lobe. Distal to this airway plugging in the posterior basal segment there is increase in patchy nodular and bandlike opacity likely from postobstructive pneumonitis or atypical pneumonia. 5. Stable small hypodense liver lesions, technically nonspecific. 6. Stable small exophytic lesions of varying complexity of the left kidney, technically nonspecific. 7. Nonobstructive left nephrolithiasis. 8. Nonfunctional severely atrophic right kidney. 9. Infrarenal abdominal aortic aneurysm, 3.7 cm in diameter. Recommend followup by [place] in 2 years. This recommendation follows ACR consensus guidelines: White Paper of the ACR Incidental Findings Committee II on Vascular Findings. J Am Coll Radiol 2013; 10:789-794. Aortic aneurysm NOS (ICD10-I71.9). 10. Multilevel lumbar impingement. 11. Sigmoid colon diverticulosis. Electronically Signed   By: Van Clines M.D.   On: 08/01/2019 09:16    ASSESSMENT AND PLAN: This is a very pleasant 78 years old white male recently diagnosed with extensive stage small cell lung cancer and currently undergoing systemic chemotherapy with carboplatin, etoposide and Tecentriq status post 5 cycles. He is also status post 17 cycle of maintenance treatment with single agent Tecentriq.   The patient has been tolerating this treatment well with no concerning adverse effects. He had repeat CT scan of the chest, abdomen pelvis performed recently.  I personally and independently reviewed the scans and discussed the results with the patient today. His scan showed no concerning findings for disease progression. I recommended for the patient to continue his current treatment with maintenance Tecentriq and he will proceed with cycle #18 today. For the hypertension  and cardiac condition, the patient will continue his routine follow-up visit by his  cardiologist. I will see him back for follow-up visit in 3 weeks for evaluation before the next cycle of his treatment. He was advised to call immediately if he has any concerning symptoms in the interval. The patient voices understanding of current disease status and treatment options and is in agreement with the current care plan.  All questions were answered. The patient knows to call the clinic with any problems, questions or concerns. We can certainly see the patient much sooner if necessary.  Disclaimer: This note was dictated with voice recognition software. Similar sounding words can inadvertently be transcribed and may not be corrected upon review.

## 2019-08-04 LAB — TSH: TSH: 1.972 u[IU]/mL (ref 0.320–4.118)

## 2019-08-10 ENCOUNTER — Other Ambulatory Visit: Payer: Self-pay | Admitting: Cardiovascular Disease

## 2019-08-10 NOTE — Telephone Encounter (Signed)
°*  STAT* If patient is at the pharmacy, call can be transferred to refill team.   1. Which medications need to be refilled? (please list name of each medication and dose if known)  isosorbide mononitrate (IMDUR) 30 MG 24 hr tablet metoprolol tartrate (LOPRESSOR) 25 MG tablet atorvastatin (LIPITOR) 80 MG tablet  2. Which pharmacy/location (including street and city if local pharmacy) is medication to be sent to? [place], [address]  3. Do they need a 30 day or 90 day supply? 90    Patient had a telemedicine appt with Dr. Claiborne Billings on 09/29 but did not ask for refills

## 2019-08-11 MED ORDER — ATORVASTATIN CALCIUM 80 MG PO TABS: ORAL_TABLET | ORAL | 0 refills | Status: AC

## 2019-08-11 MED ORDER — METOPROLOL TARTRATE 25 MG PO TABS: 25.0000 mg | ORAL_TABLET | Freq: Two times a day (BID) | ORAL | 0 refills | Status: AC

## 2019-08-11 MED ORDER — ISOSORBIDE MONONITRATE ER 30 MG PO TB24: 30.0000 mg | ORAL_TABLET | Freq: Every day | ORAL | 0 refills | Status: AC

## 2019-08-24 ENCOUNTER — Inpatient Hospital Stay (HOSPITAL_BASED_OUTPATIENT_CLINIC_OR_DEPARTMENT_OTHER): Payer: Medicare HMO | Admitting: Internal Medicine

## 2019-08-24 ENCOUNTER — Ambulatory Visit: Payer: Medicare HMO | Admitting: Internal Medicine

## 2019-08-24 ENCOUNTER — Ambulatory Visit: Payer: Medicare HMO

## 2019-08-24 ENCOUNTER — Other Ambulatory Visit: Payer: Self-pay | Admitting: Medical Oncology

## 2019-08-24 ENCOUNTER — Inpatient Hospital Stay: Payer: Medicare HMO

## 2019-08-24 ENCOUNTER — Other Ambulatory Visit: Payer: Self-pay

## 2019-08-24 ENCOUNTER — Other Ambulatory Visit: Payer: Medicare HMO

## 2019-08-24 ENCOUNTER — Encounter: Payer: Self-pay | Admitting: Internal Medicine

## 2019-08-24 VITALS — BP 116/92 | HR 94 | Temp 97.6°F | Resp 17 | Ht 70.0 in | Wt 149.2 lb

## 2019-08-24 DIAGNOSIS — I10 Essential (primary) hypertension: Secondary | ICD-10-CM | POA: Diagnosis not present

## 2019-08-24 DIAGNOSIS — C3411 Malignant neoplasm of upper lobe, right bronchus or lung: Secondary | ICD-10-CM

## 2019-08-24 DIAGNOSIS — Z23 Encounter for immunization: Secondary | ICD-10-CM

## 2019-08-24 DIAGNOSIS — Z79899 Other long term (current) drug therapy: Secondary | ICD-10-CM | POA: Diagnosis not present

## 2019-08-24 DIAGNOSIS — C342 Malignant neoplasm of middle lobe, bronchus or lung: Secondary | ICD-10-CM | POA: Diagnosis not present

## 2019-08-24 DIAGNOSIS — Z5112 Encounter for antineoplastic immunotherapy: Secondary | ICD-10-CM | POA: Diagnosis not present

## 2019-08-24 LAB — CMP (CANCER CENTER ONLY)
ALT: 10 U/L (ref 0–44)
AST: 15 U/L (ref 15–41)
Albumin: 3.5 g/dL (ref 3.5–5.0)
Alkaline Phosphatase: 78 U/L (ref 38–126)
Anion gap: 12 (ref 5–15)
BUN: 35 mg/dL — ABNORMAL HIGH (ref 8–23)
CO2: 22 mmol/L (ref 22–32)
Calcium: 10.3 mg/dL (ref 8.9–10.3)
Chloride: 107 mmol/L (ref 98–111)
Creatinine: 2.67 mg/dL — ABNORMAL HIGH (ref 0.61–1.24)
GFR, Est AFR Am: 26 mL/min — ABNORMAL LOW (ref >60)
GFR, Estimated: 22 mL/min — ABNORMAL LOW (ref >60)
Glucose, Bld: 98 mg/dL (ref 70–99)
Potassium: 4.5 mmol/L (ref 3.5–5.1)
Sodium: 141 mmol/L (ref 135–145)
Total Bilirubin: 0.7 mg/dL (ref 0.3–1.2)
Total Protein: 7.3 g/dL (ref 6.5–8.1)

## 2019-08-24 LAB — CBC WITH DIFFERENTIAL (CANCER CENTER ONLY)
Abs Immature Granulocytes: 0.01 10*3/uL (ref 0.00–0.07)
Basophils Absolute: 0.1 10*3/uL (ref 0.0–0.1)
Basophils Relative: 1 %
Eosinophils Absolute: 0.1 10*3/uL (ref 0.0–0.5)
Eosinophils Relative: 1 %
HCT: 40.7 % (ref 39.0–52.0)
Hemoglobin: 13.5 g/dL (ref 13.0–17.0)
Immature Granulocytes: 0 %
Lymphocytes Relative: 16 %
Lymphs Abs: 1.1 10*3/uL (ref 0.7–4.0)
MCH: 32.5 pg (ref 26.0–34.0)
MCHC: 33.2 g/dL (ref 30.0–36.0)
MCV: 97.8 fL (ref 80.0–100.0)
Monocytes Absolute: 0.5 10*3/uL (ref 0.1–1.0)
Monocytes Relative: 8 %
Neutro Abs: 5 10*3/uL (ref 1.7–7.7)
Neutrophils Relative %: 74 %
Platelet Count: 233 10*3/uL (ref 150–400)
RBC: 4.16 MIL/uL — ABNORMAL LOW (ref 4.22–5.81)
RDW: 13.5 % (ref 11.5–15.5)
WBC Count: 6.7 10*3/uL (ref 4.0–10.5)
nRBC: 0 % (ref 0.0–0.2)

## 2019-08-24 LAB — TSH: TSH: 1.939 u[IU]/mL (ref 0.320–4.118)

## 2019-08-24 MED ORDER — SODIUM CHLORIDE 0.9 % IV SOLN
Freq: Once | INTRAVENOUS | Status: AC
  Administered 2019-08-24: 16:00:00 via INTRAVENOUS
  Filled 2019-08-24: qty 250

## 2019-08-24 MED ORDER — INFLUENZA VAC A&B SA ADJ QUAD 0.5 ML IM PRSY
0.5000 mL | PREFILLED_SYRINGE | Freq: Once | INTRAMUSCULAR | Status: AC
  Administered 2019-08-24: 0.5 mL via INTRAMUSCULAR

## 2019-08-24 MED ORDER — INFLUENZA VAC A&B SA ADJ QUAD 0.5 ML IM PRSY
PREFILLED_SYRINGE | INTRAMUSCULAR | Status: AC
  Filled 2019-08-24: qty 0.5

## 2019-08-24 MED ORDER — SODIUM CHLORIDE 0.9 % IV SOLN
1200.0000 mg | Freq: Once | INTRAVENOUS | Status: AC
  Administered 2019-08-24: 1200 mg via INTRAVENOUS
  Filled 2019-08-24: qty 20

## 2019-08-24 NOTE — Progress Notes (Signed)
Per Dr. Julien Nordmann, [place] to treat with Creatinine 2.67.

## 2019-08-24 NOTE — Progress Notes (Signed)
[place] Telephone:([phone]) [phone]   [phone]) [phone]  OFFICE PROGRESS NOTE  Patient, No Pcp Per No address on file  DIAGNOSIS: Extensive stage (T4, N2, M1a) small cell lung cancer presented with large right perihilar mass with mediastinal invasion and loculated malignant right pleural effusion diagnosed in June 2019.  PRIOR THERAPY:None.  CURRENT THERAPY: Systemic chemotherapy with carboplatin for AUC of 5 on day 1, etoposide 100 mg/M2 on days 1, 2 and 3 in addition to Tecentriq 1200 mg IV every 3 weeks with Neulasta support.  Status post 23 cycles.  Starting from cycle #6 the patient will be treated with maintenance single agent Tecentriq every 3 weeks  INTERVAL HISTORY: Christopher Wilson 78 y.o. male returns to the clinic today for follow-up visit.  The patient is feeling fine today with no concerning complaints except for cough with thick mucus.  He denied having any current chest pain and his oxygen saturation is 100% on room air.  He has no hemoptysis.  He denied having any nausea, vomiting, diarrhea or constipation.  He denied having any fever or chills.  He has been tolerating his treatment with Tecentriq fairly well.  The patient is here today for evaluation before starting cycle #24.   MEDICAL HISTORY: Past Medical History:  Diagnosis Date   ACS (acute coronary syndrome), with ST elevation but stable coronary arteries on cath 06/27/12 06/27/2012   CAD (coronary artery disease), with CABG 1989 and re-do Cabg 2002 X 3 vesssels.  progressive disease with Stent to Lt. Main, and 1st diag 2006, stent to VG to LCX 2007, with known occlusion history o  06/27/2012   CKD (chronic kidney disease) stage 3, GFR 30-59 ml/min 06/27/2012   Congenital single kidney 06/27/2012   Coronary artery disease    GERD (gastroesophageal reflux disease)    Hyperlipidemia LDL goal < 70 06/27/2012   Hypertension    Myocardial infarction [facility])    Peptic ulcer    STEMI (ST  elevation myocardial infarction) ([place]) 06/27/2012   Tobacco abuse 06/27/2012    ALLERGIES:  has No Known Allergies.  MEDICATIONS:  Current Outpatient Medications  Medication Sig Dispense Refill   acetaminophen (TYLENOL) 325 MG tablet Take 650 mg by mouth every 6 (six) hours as needed for moderate pain.     albuterol (PROVENTIL HFA;VENTOLIN HFA) 108 (90 Base) MCG/ACT inhaler Inhale 2 puffs into the lungs every 6 (six) hours as needed for wheezing or shortness of breath.     atorvastatin (LIPITOR) 80 MG tablet TAKE ONE TABLET BY MOUTH DAILY AT 6 P.M. 90 tablet 0   isosorbide mononitrate (IMDUR) 30 MG 24 hr tablet Take 1 tablet (30 mg total) by mouth daily. 90 tablet 0   levothyroxine (SYNTHROID) 125 MCG tablet Take 1 tablet (125 mcg total) by mouth daily before breakfast. 30 tablet 0   metoprolol tartrate (LOPRESSOR) 25 MG tablet Take 1 tablet (25 mg total) by mouth 2 (two) times daily. OV NEEDED. 180 tablet 0   nitroGLYCERIN (NITROSTAT) 0.4 MG SL tablet Place 1 tablet (0.4 mg total) under the tongue every 5 (five) minutes as needed for chest pain. 25 tablet 0   prochlorperazine (COMPAZINE) 10 MG tablet Take 1 tablet (10 mg total) by mouth every 6 (six) hours as needed for nausea or vomiting. 30 tablet 0   rivaroxaban (XARELTO) 20 MG TABS tablet Take 1 tablet (20 mg total) by mouth daily with supper. 90 tablet 1   No current facility-administered medications for  this visit.     SURGICAL HISTORY:  Past Surgical History:  Procedure Laterality Date   coratid arterty surgery     CORONARY ANGIOPLASTY WITH STENT PLACEMENT     CORONARY ARTERY BYPASS GRAFT     IR GUIDED DRAIN W CATHETER PLACEMENT  04/08/2018   IR REMOVAL OF PLURAL CATH W/CUFF  06/19/2018   LCEA     LEFT HEART CATH N/A 06/27/2012   Procedure: LEFT HEART CATH;  Surgeon: Troy Sine, MD;  Location: [facility] CATH LAB;  Service: Cardiovascular;  Laterality: N/A;   triple bipass     Hx of CABG and RE do   VASCULAR  SURGERY     VIDEO BRONCHOSCOPY Bilateral 04/07/2018   Procedure: VIDEO BRONCHOSCOPY WITHOUT FLUORO;  Surgeon: Rush Farmer, MD;  Location[facility] ENDOSCOPY;  Service: Cardiopulmonary;  Laterality: Bilateral;    REVIEW OF SYSTEMS:  A comprehensive review of systems was negative except for: Constitutional: positive for fatigue Respiratory: positive for cough and sputum   PHYSICAL EXAMINATION: General appearance: alert, cooperative, fatigued and no distress Head: Normocephalic, without obvious abnormality, atraumatic Neck: no adenopathy, no JVD, supple, symmetrical, trachea midline and thyroid not enlarged, symmetric, no tenderness/mass/nodules Lymph nodes: Cervical, supraclavicular, and axillary nodes normal. Resp: clear to auscultation bilaterally Back: symmetric, no curvature. ROM normal. No CVA tenderness. Cardio: regular rate and rhythm, S1, S2 normal, no murmur, click, rub or gallop GI: soft, non-tender; bowel sounds normal; no masses,  no organomegaly Extremities: extremities normal, atraumatic, no cyanosis or edema  ECOG PERFORMANCE STATUS: 1 - Symptomatic but completely ambulatory  Blood pressure (!) 116/92, pulse 94, temperature 97.6 F (36.4 C), temperature source Temporal, resp. rate 17, height 5\' 10"  (1.778 m), weight 149 lb 3.2 oz (67.7 kg), SpO2 100 %.  LABORATORY DATA: Lab Results  Component Value Date   WBC 6.7 08/24/2019   HGB 13.5 08/24/2019   HCT 40.7 08/24/2019   MCV 97.8 08/24/2019   PLT 233 08/24/2019      Chemistry      Component Value Date/Time   NA 141 08/03/2019 1511   NA 140 03/21/2018 1048   K 4.8 08/03/2019 1511   CL 110 08/03/2019 1511   CO2 22 08/03/2019 1511   BUN 42 (H) 08/03/2019 1511   BUN 45 (H) 03/21/2018 1048   CREATININE 2.59 (H) 08/03/2019 1511   CREATININE 1.80 (H) 09/06/2014 1033      Component Value Date/Time   CALCIUM 9.9 08/03/2019 1511   ALKPHOS 75 08/03/2019 1511   AST 20 08/03/2019 1511   ALT 15 08/03/2019 1511   BILITOT  0.5 08/03/2019 1511       RADIOGRAPHIC STUDIES: Ct Abdomen Pelvis Wo Contrast  Result Date: 08/01/2019 CLINICAL DATA:  Small cell lung cancer of the right upper lobe, restaging EXAM: CT CHEST, ABDOMEN AND PELVIS WITHOUT CONTRAST TECHNIQUE: Multidetector CT imaging of the chest, abdomen and pelvis was performed following the standard protocol without IV contrast. COMPARISON:  Multiple exams, including 05/08/2019 and 03/06/2019 FINDINGS: CT CHEST FINDINGS Cardiovascular: Coronary, aortic arch, and branch vessel atherosclerotic vascular disease. Prior CABG. Fatty density along the lateral wall of the left ventricle likely from remote infarct. Tortuous thoracic aorta. Mediastinum/Nodes: Right lower paratracheal node 0.9 cm in short axis on image 27/2, stable. Lungs/Pleura: Biapical pleuroparenchymal scarring. This has a stable nodular component on the right A mass along the minor fissure measures 3.4 by 2.8 cm on image 73/4, previously 3.3 by 2.7 cm when measured in a similar fashion. Adjacent scarring  or volume loss. There is frothy fluid in the bronchus intermedius along with airway thickening in the right middle lobe and right lower lobe bronchus with cylindrical bronchiectasis and airway plugging in the right lower lobe. Distal to this airway plugging there some patchy nodular and bandlike opacities in the posterior basal segment right lower lobe which are increased previous. Centrilobular and paraseptal emphysema. Musculoskeletal: Prior median sternotomy for CABG. Thoracic spondylosis. CT ABDOMEN PELVIS FINDINGS Hepatobiliary: 1.1 by 0.8 cm hypodense lesion in segment 2 of the liver on image 50/2, stable. At least 2 additional small hypodense lesions in the liver are observed and appear stable as well. Gallbladder unremarkable. No biliary dilatation. Pancreas: Unremarkable Spleen: Unremarkable Adrenals/Urinary Tract: Adrenal glands unremarkable. Nonfunctional severely atrophic and multi-cystic right kidney.  Exophytic lesions from the left kidney of varying complexity not appreciably changed from prior. Five left renal calculi are present, with one of the largest measuring 0.8 cm in long axis on image 47/5. No hydronephrosis or hydroureter. Urinary bladder unremarkable. Stomach/Bowel: Sigmoid colon diverticulosis. Otherwise unremarkable. Vascular/Lymphatic: Aortoiliac atherosclerotic vascular disease. Infrarenal abdominal aortic aneurysm, 3.7 cm in diameter, formerly 3.6 cm. No pathologic adenopathy identified. Reproductive: Dystrophic calcifications in the prostate gland. Other: No supplemental non-categorized findings. Musculoskeletal: Chronic appearing anterior wedging at L1. Degenerative retrolisthesis at L2-3 and L3-4 with degenerative anterolisthesis at L4-5. Lumbar spondylosis and degenerative disc disease causing multilevel impingement. Bridging spurring of the left sacroiliac joint. IMPRESSION: 1. The right upper lobe mass currently measures 3.4 by 2.8 cm, previously 3.3 by 2.7 cm by my measurements, roughly stable. 2. Stable biapical pleuroparenchymal scarring, right greater than left. 3. Aortic Atherosclerosis (ICD10-I70.0) and Emphysema (ICD10-J43.9). Coronary atherosclerosis with prior CABG. 4. Frothy fluid in the bronchus intermedius with airway plugging especially in the right lower lobe. Distal to this airway plugging in the posterior basal segment there is increase in patchy nodular and bandlike opacity likely from postobstructive pneumonitis or atypical pneumonia. 5. Stable small hypodense liver lesions, technically nonspecific. 6. Stable small exophytic lesions of varying complexity of the left kidney, technically nonspecific. 7. Nonobstructive left nephrolithiasis. 8. Nonfunctional severely atrophic right kidney. 9. Infrarenal abdominal aortic aneurysm, 3.7 cm in diameter. Recommend followup by [place] in 2 years. This recommendation follows ACR consensus guidelines: White Paper of the ACR Incidental  Findings Committee II on Vascular Findings. J Am Coll Radiol 2013; 10:789-794. Aortic aneurysm NOS (ICD10-I71.9). 10. Multilevel lumbar impingement. 11. Sigmoid colon diverticulosis. Electronically Signed   By: Van Clines M.D.   On: 08/01/2019 09:16   Ct Chest Wo Contrast  Result Date: 08/01/2019 CLINICAL DATA:  Small cell lung cancer of the right upper lobe, restaging EXAM: CT CHEST, ABDOMEN AND PELVIS WITHOUT CONTRAST TECHNIQUE: Multidetector CT imaging of the chest, abdomen and pelvis was performed following the standard protocol without IV contrast. COMPARISON:  Multiple exams, including 05/08/2019 and 03/06/2019 FINDINGS: CT CHEST FINDINGS Cardiovascular: Coronary, aortic arch, and branch vessel atherosclerotic vascular disease. Prior CABG. Fatty density along the lateral wall of the left ventricle likely from remote infarct. Tortuous thoracic aorta. Mediastinum/Nodes: Right lower paratracheal node 0.9 cm in short axis on image 27/2, stable. Lungs/Pleura: Biapical pleuroparenchymal scarring. This has a stable nodular component on the right A mass along the minor fissure measures 3.4 by 2.8 cm on image 73/4, previously 3.3 by 2.7 cm when measured in a similar fashion. Adjacent scarring or volume loss. There is frothy fluid in the bronchus intermedius along with airway thickening in the right middle lobe and right lower lobe bronchus  with cylindrical bronchiectasis and airway plugging in the right lower lobe. Distal to this airway plugging there some patchy nodular and bandlike opacities in the posterior basal segment right lower lobe which are increased previous. Centrilobular and paraseptal emphysema. Musculoskeletal: Prior median sternotomy for CABG. Thoracic spondylosis. CT ABDOMEN PELVIS FINDINGS Hepatobiliary: 1.1 by 0.8 cm hypodense lesion in segment 2 of the liver on image 50/2, stable. At least 2 additional small hypodense lesions in the liver are observed and appear stable as well.  Gallbladder unremarkable. No biliary dilatation. Pancreas: Unremarkable Spleen: Unremarkable Adrenals/Urinary Tract: Adrenal glands unremarkable. Nonfunctional severely atrophic and multi-cystic right kidney. Exophytic lesions from the left kidney of varying complexity not appreciably changed from prior. Five left renal calculi are present, with one of the largest measuring 0.8 cm in long axis on image 47/5. No hydronephrosis or hydroureter. Urinary bladder unremarkable. Stomach/Bowel: Sigmoid colon diverticulosis. Otherwise unremarkable. Vascular/Lymphatic: Aortoiliac atherosclerotic vascular disease. Infrarenal abdominal aortic aneurysm, 3.7 cm in diameter, formerly 3.6 cm. No pathologic adenopathy identified. Reproductive: Dystrophic calcifications in the prostate gland. Other: No supplemental non-categorized findings. Musculoskeletal: Chronic appearing anterior wedging at L1. Degenerative retrolisthesis at L2-3 and L3-4 with degenerative anterolisthesis at L4-5. Lumbar spondylosis and degenerative disc disease causing multilevel impingement. Bridging spurring of the left sacroiliac joint. IMPRESSION: 1. The right upper lobe mass currently measures 3.4 by 2.8 cm, previously 3.3 by 2.7 cm by my measurements, roughly stable. 2. Stable biapical pleuroparenchymal scarring, right greater than left. 3. Aortic Atherosclerosis (ICD10-I70.0) and Emphysema (ICD10-J43.9). Coronary atherosclerosis with prior CABG. 4. Frothy fluid in the bronchus intermedius with airway plugging especially in the right lower lobe. Distal to this airway plugging in the posterior basal segment there is increase in patchy nodular and bandlike opacity likely from postobstructive pneumonitis or atypical pneumonia. 5. Stable small hypodense liver lesions, technically nonspecific. 6. Stable small exophytic lesions of varying complexity of the left kidney, technically nonspecific. 7. Nonobstructive left nephrolithiasis. 8. Nonfunctional severely  atrophic right kidney. 9. Infrarenal abdominal aortic aneurysm, 3.7 cm in diameter. Recommend followup by [place] in 2 years. This recommendation follows ACR consensus guidelines: White Paper of the ACR Incidental Findings Committee II on Vascular Findings. J Am Coll Radiol 2013; 10:789-794. Aortic aneurysm NOS (ICD10-I71.9). 10. Multilevel lumbar impingement. 11. Sigmoid colon diverticulosis. Electronically Signed   By: Van Clines M.D.   On: 08/01/2019 09:16    ASSESSMENT AND PLAN: This is a very pleasant 78 years old white male recently diagnosed with extensive stage small cell lung cancer and currently undergoing systemic chemotherapy with carboplatin, etoposide and Tecentriq status post 5 cycles. He is also status post 18 cycle of maintenance treatment with single agent Tecentriq.   He has been tolerating this treatment well with no concerning adverse effects. I recommended for the patient to proceed with cycle #19 of his maintenance treatment today as planned.  For the sake mucus, I advised him to use Mucinex as well as increasing his warm liquid drinking. For the hypertension and cardiac condition, the patient will continue his routine follow-up visit by his cardiologist. He will come back for follow-up visit in 3 weeks for evaluation before the next cycle of his treatment. The patient was advised to call immediately if he has any concerning symptoms in the interval. The patient voices understanding of current disease status and treatment options and is in agreement with the current care plan.  All questions were answered. The patient knows to call the clinic with any problems, questions or concerns. We can  certainly see the patient much sooner if necessary.  Disclaimer: This note was dictated with voice recognition software. Similar sounding words can inadvertently be transcribed and may not be corrected upon review.

## 2019-08-24 NOTE — Patient Instructions (Signed)
[facility] Discharge Instructions for Patients Receiving Chemotherapy  Today you received the following chemotherapy agents: Tecentriq  To help prevent nausea and vomiting after your treatment, we encourage you to take your nausea medication as directed.   If you develop nausea and vomiting that is not controlled by your nausea medication, call the clinic.   BELOW ARE SYMPTOMS THAT SHOULD BE REPORTED IMMEDIATELY:  *FEVER GREATER THAN 100.5 F  *CHILLS WITH OR WITHOUT FEVER  NAUSEA AND VOMITING THAT IS NOT CONTROLLED WITH YOUR NAUSEA MEDICATION  *UNUSUAL SHORTNESS OF BREATH  *UNUSUAL BRUISING OR BLEEDING  TENDERNESS IN MOUTH AND THROAT WITH OR WITHOUT PRESENCE OF ULCERS  *URINARY PROBLEMS  *BOWEL PROBLEMS  UNUSUAL RASH Items with * indicate a potential emergency and should be followed up as soon as possible.  Feel free to call the clinic should you have any questions or concerns. The clinic phone number is [phone].  Please show the CHEMO ALERT CARD at check-in to the Emergency Department and triage nurse.   

## 2019-08-25 ENCOUNTER — Telehealth: Payer: Self-pay | Admitting: Internal Medicine

## 2019-08-25 ENCOUNTER — Other Ambulatory Visit: Payer: Self-pay | Admitting: Internal Medicine

## 2019-08-25 DIAGNOSIS — R7989 Other specified abnormal findings of blood chemistry: Secondary | ICD-10-CM

## 2019-08-25 NOTE — Telephone Encounter (Signed)
Scheduled per los. Called, not able to leave msg. Mailed printout  

## 2019-09-14 ENCOUNTER — Telehealth: Payer: Self-pay | Admitting: *Deleted

## 2019-09-14 ENCOUNTER — Inpatient Hospital Stay: Payer: Medicare HMO | Admitting: Physician Assistant

## 2019-09-14 ENCOUNTER — Other Ambulatory Visit: Payer: Self-pay

## 2019-09-14 ENCOUNTER — Inpatient Hospital Stay: Payer: Medicare HMO

## 2019-09-14 NOTE — Telephone Encounter (Signed)
Wife states patient is becoming weaker by the minute, she feels he needs a Sport and exercise psychologist.  Encouraged her to keep today's appt with Cassie to discuss.

## 2019-09-14 NOTE — Telephone Encounter (Signed)
Wife called to cancel appts. Pt too weak to come.  Is requesting a home health referral for assistance with ADLs.

## 2019-09-15 ENCOUNTER — Telehealth: Payer: Self-pay | Admitting: *Deleted

## 2019-09-15 DIAGNOSIS — C3411 Malignant neoplasm of upper lobe, right bronchus or lung: Secondary | ICD-10-CM

## 2019-09-15 NOTE — Telephone Encounter (Signed)
Spoke with [name], daughter who advised they declined the assistance from home health in sept as she did not think pt needed it at that time. Pt unable to come in for an appt.  Reviewed with MD,new referral for home health to evaluate pt status.

## 2019-09-15 NOTE — Telephone Encounter (Signed)
Called patient left message to question referral that was already established on 06/30/2019 to [place].    Patients family member called yesterday requesting services but it looks like it has already been ordered.  Need to follow up to ensure that they were contacted by an agency.  Pending call back.

## 2019-09-16 ENCOUNTER — Telehealth: Payer: Self-pay | Admitting: *Deleted

## 2019-09-16 NOTE — Telephone Encounter (Signed)
Pt daughter [name] called regarding status of home health referral. Discussed with [name] when she declined [facility] services in Sept, a new referral must be entered- this was done 11/18 after our conversation. A HH nurse will contact her/pt regarding the appt/evaluation. This may take a few days. [name] requested [facility] phone # as she would like to call and speak with the staff to set up the first appt. No further concerns.

## 2019-09-18 ENCOUNTER — Telehealth: Payer: Self-pay

## 2019-09-18 NOTE — Telephone Encounter (Signed)
Christopher Wilson, [name] daughter called today stating that [name] had not received the referral for his evaluation.  I spoke with [facility].  They do not accept [facility] medicare.  They told me [facility] is now the provider for [facility].  I faxed a referral to [facility] at [phone] to the attention of Melissa Hairston the intake RN.  I phone [name] and updated her on these events.

## 2019-09-22 ENCOUNTER — Telehealth: Payer: Self-pay | Admitting: Medical Oncology

## 2019-09-22 NOTE — Telephone Encounter (Signed)
Home health f/u -nurse has not contacted them.  I called Dana Allan at [facility] and she needs nursing orders -orders given and she will call [name].

## 2019-09-26 DIAGNOSIS — I2510 Atherosclerotic heart disease of native coronary artery without angina pectoris: Secondary | ICD-10-CM | POA: Diagnosis not present

## 2019-09-26 DIAGNOSIS — I129 Hypertensive chronic kidney disease with stage 1 through stage 4 chronic kidney disease, or unspecified chronic kidney disease: Secondary | ICD-10-CM | POA: Diagnosis not present

## 2019-09-26 DIAGNOSIS — K219 Gastro-esophageal reflux disease without esophagitis: Secondary | ICD-10-CM | POA: Diagnosis not present

## 2019-09-26 DIAGNOSIS — N183 Chronic kidney disease, stage 3 unspecified: Secondary | ICD-10-CM | POA: Diagnosis not present

## 2019-09-26 DIAGNOSIS — Z951 Presence of aortocoronary bypass graft: Secondary | ICD-10-CM | POA: Diagnosis not present

## 2019-09-26 DIAGNOSIS — I249 Acute ischemic heart disease, unspecified: Secondary | ICD-10-CM | POA: Diagnosis not present

## 2019-09-26 DIAGNOSIS — F17210 Nicotine dependence, cigarettes, uncomplicated: Secondary | ICD-10-CM | POA: Diagnosis not present

## 2019-09-26 DIAGNOSIS — C3411 Malignant neoplasm of upper lobe, right bronchus or lung: Secondary | ICD-10-CM | POA: Diagnosis not present

## 2019-09-26 DIAGNOSIS — I252 Old myocardial infarction: Secondary | ICD-10-CM | POA: Diagnosis not present

## 2019-09-28 IMAGING — CT CT ABD-PELV W/O CM
2 of 4 series · 11 of 36 positions shown, 13 images · non-contrast
Comparison: 06/13/2018 CT chest, abdomen and pelvis. 04/21/2018
PET-CT.

CLINICAL DATA: Extensive stage small cell right lung cancer
diagnosed March 2018 status post chemotherapy with ongoing
immunotherapy. Restaging.

EXAM:
CT CHEST, ABDOMEN AND PELVIS WITHOUT CONTRAST
TECHNIQUE: Multidetector CT imaging of the chest, abdomen and pelvis was
performed following the standard protocol without IV contrast.

[Series 2: cap w/o · axial · non-contrast · 0.76mm/px · z∈[-606,-62]mm · 8 of 135 slices shown, 10 images]
[im 13/135  mediastinal]
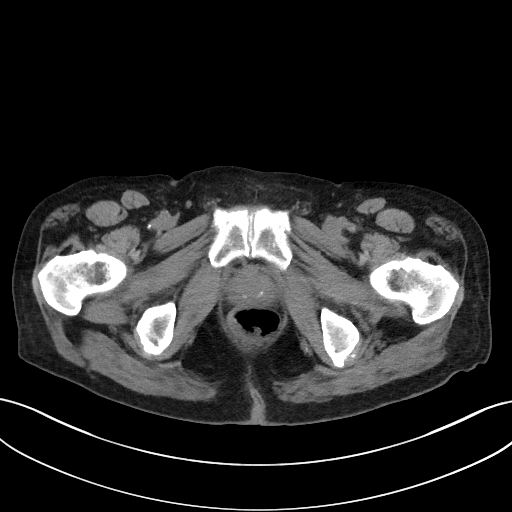
[im 13/135  lung]
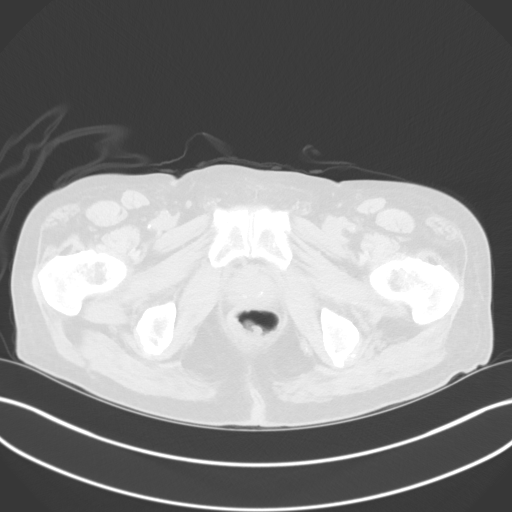
[im 25/135  lung]
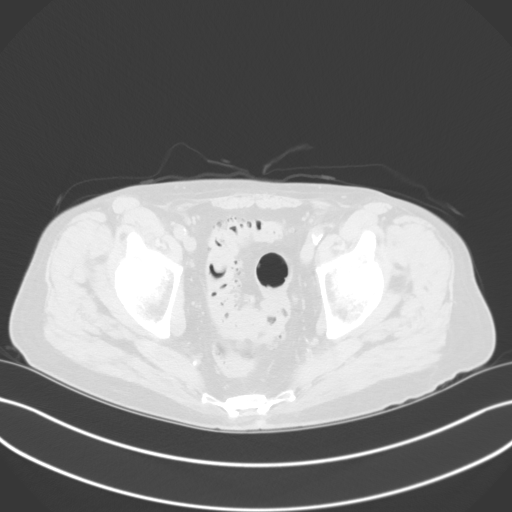
[im 49/135  lung]
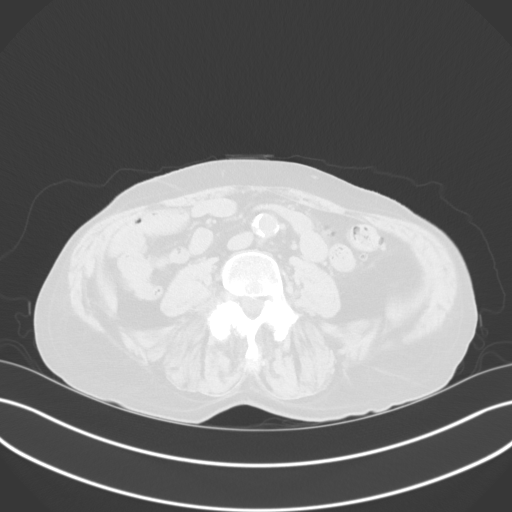
[im 61/135  lung]
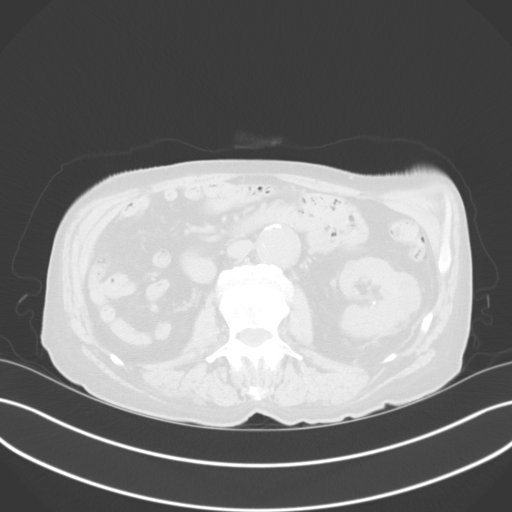
[im 74/135  mediastinal]
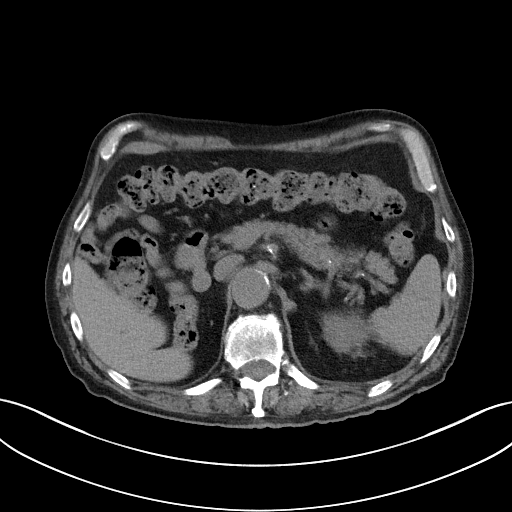
[im 74/135  lung]
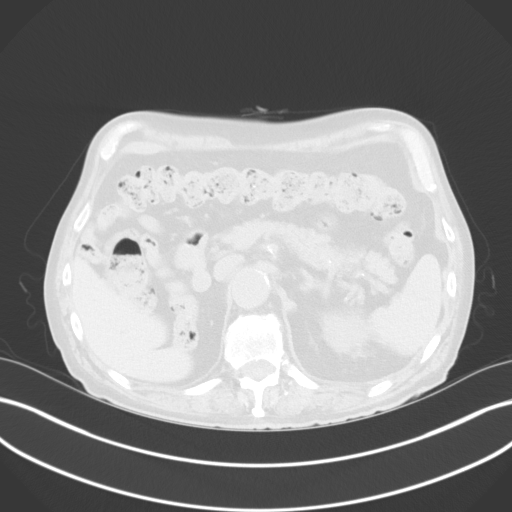
[im 86/135  lung]
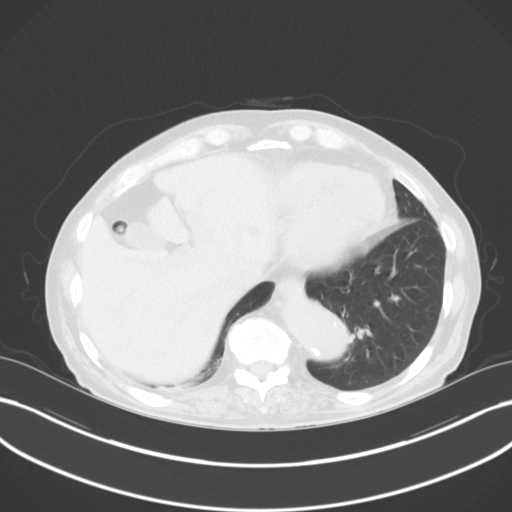
[im 110/135  lung]
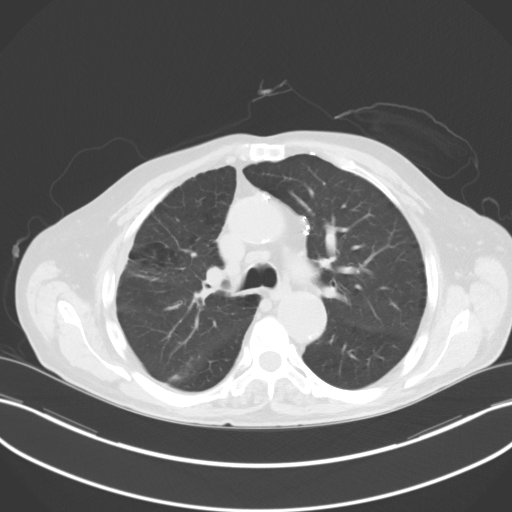
[im 122/135  lung]
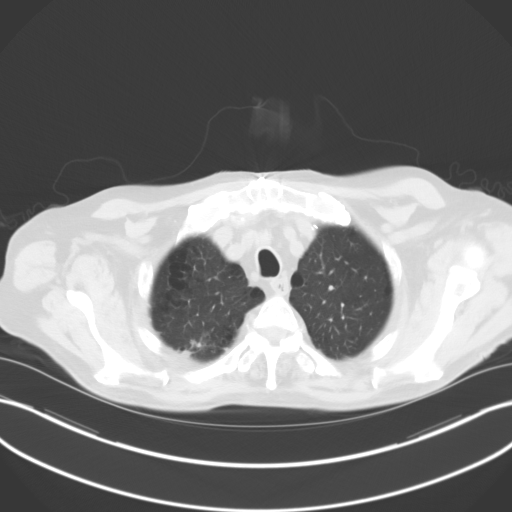

[Series 5: coronals · coronal · 0.74mm/px · 3 of 151 slices shown]
[im 31/151  lung]
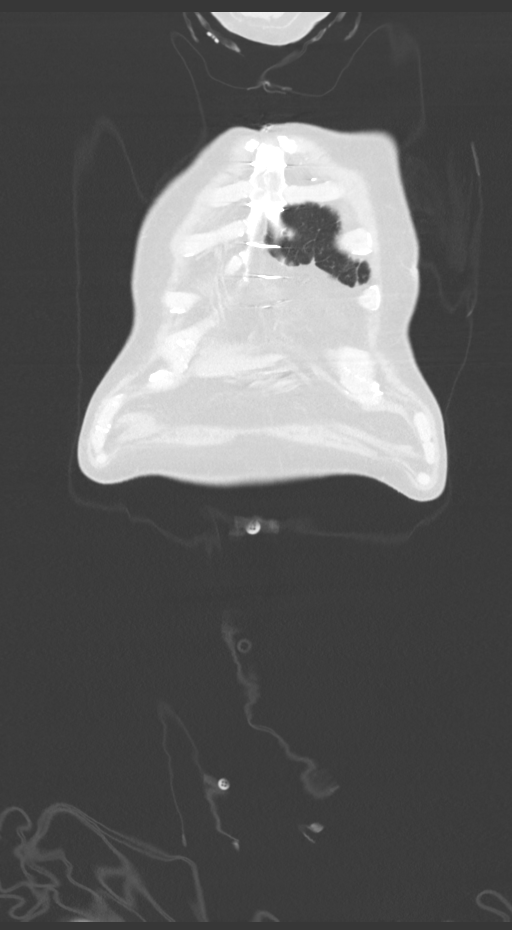
[im 61/151  lung]
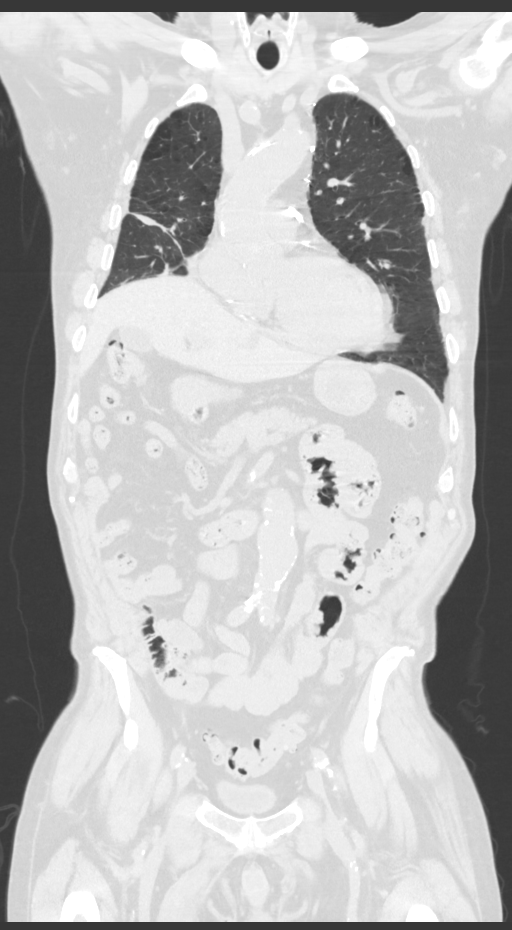
[im 91/151  lung]
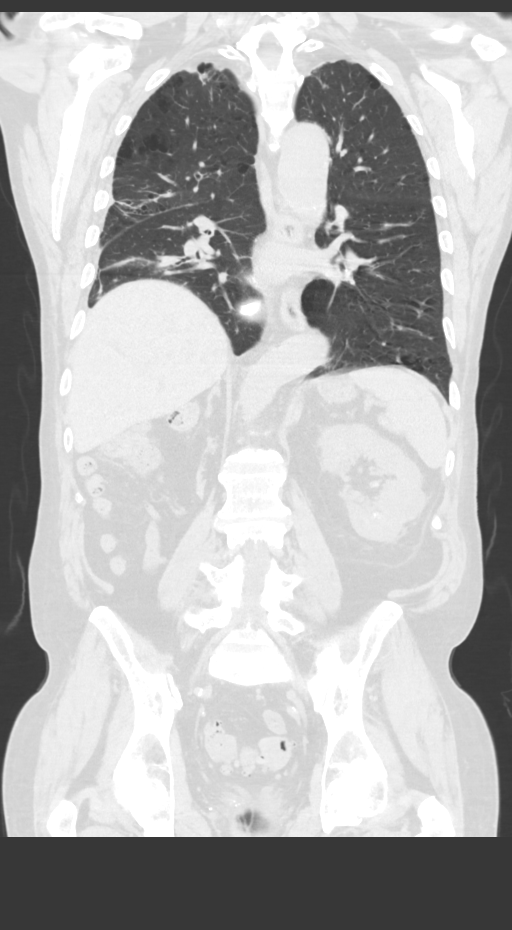

[11 of 36 positions shown; findings below may reference images not displayed]

FINDINGS: CT CHEST FINDINGS

Cardiovascular: Top-normal heart size. Stable subendocardial fat
along the free wall of the left ventricle compatible with prior
myocardial infarction. Three-vessel coronary atherosclerosis status
post CABG. Atherosclerotic nonaneurysmal thoracic aorta. Normal
caliber pulmonary arteries.

Mediastinum/Nodes: No discrete thyroid nodules. Unremarkable
esophagus. No axillary adenopathy. No residual discrete
pathologically enlarged mediastinal nodes on this noncontrast scan.
Mildly enlarged 1.1 cm right hilar node (series 2/image 30),
decreased from 1.5 cm using similar measurement technique. No
discrete left hilar adenopathy.

Lungs/Pleura: No pneumothorax. Interval removal of right chest tube.
No pleural effusions. Mild right lateral pleural thickening (series
2/image 29) is decreased. Right middle lobe 3.8 x 2.9 cm lung mass
extending into the basilar right upper lobe (series 2/image 31),
decreased from 5.0 x 3.7 cm. Moderate centrilobular and paraseptal
emphysema with diffuse bronchial wall thickening. Irregular apical
right upper lobe 1.5 cm solid pulmonary nodule (series 4/image 26),
stable. Posterior right upper lobe 0.5 cm pulmonary nodule (series
4/image 66), stable. Curvilinear parenchymal band in dependent
basilar right lower lobe is decreased in thickness, compatible with
evolving scarring. No acute consolidative airspace disease. No new
significant pulmonary nodules.

Musculoskeletal: No aggressive appearing focal osseous lesions.
Intact sternotomy wires. Moderate thoracic spondylosis.

CT ABDOMEN PELVIS FINDINGS

Hepatobiliary: Normal size liver. Simple 0.9 cm lateral segment left
liver lobe cyst is stable. No new liver lesions. Normal gallbladder
with no radiopaque cholelithiasis. No biliary ductal dilatation.

Pancreas: Normal, with no mass or duct dilation.

Spleen: Normal size. No mass.

Adrenals/Urinary Tract: Normal adrenals. Stable severe atrophy of
the right kidney with stable calcified 1.7 cm exophytic lateral
lower right renal cortical lesion. Multiple nonobstructing stones
throughout the left kidney, largest 7 mm in the upper left kidney.
No hydronephrosis. Stable septated 2.5 cm lateral lower left renal
cyst. Stable subcentimeter hyperdense renal cortical lesions in the
upper and lower left kidney. Normal bladder.

Stomach/Bowel: Normal non-distended stomach. Normal caliber small
bowel with no small bowel wall thickening. Normal appendix. Marked
colonic diverticulosis, most prominent in the sigmoid colon, with no
large bowel wall thickening or acute pericolonic fat stranding.

Vascular/Lymphatic: Atherosclerotic abdominal aorta with stable
cm infrarenal abdominal aortic aneurysm. No pathologically enlarged
lymph nodes in the abdomen or pelvis.

Reproductive: Normal size prostate with coarse internal prostatic
calcifications.

Other: No pneumoperitoneum, ascites or focal fluid collection.

Musculoskeletal: No aggressive appearing focal osseous lesions.
Moderate lumbar spondylosis.
IMPRESSION: 1. Continued positive partial treatment response. Right middle/upper
lobe lung mass is decreased. Additional right upper lobe pulmonary
nodules are stable. Mild lateral right pleural thickening is
decreased, with no recurrent right pleural effusion. Right hilar and
mediastinal adenopathy is decreased. No new or progressive
metastatic disease.
2. Aortic Atherosclerosis (QJIIS-6PT.T) and Emphysema (QJIIS-OBF.1).
3. Stable 4.1 cm infrarenal Abdominal Aortic Aneurysm (QJIIS-VEV.B).
Recommend follow-up aortic ultrasound in 1 year. This recommendation
follows ACR consensus guidelines: White Paper of the ACR Incidental

## 2019-09-29 ENCOUNTER — Other Ambulatory Visit: Payer: Self-pay | Admitting: Internal Medicine

## 2019-09-29 DIAGNOSIS — I249 Acute ischemic heart disease, unspecified: Secondary | ICD-10-CM | POA: Diagnosis not present

## 2019-09-29 DIAGNOSIS — I129 Hypertensive chronic kidney disease with stage 1 through stage 4 chronic kidney disease, or unspecified chronic kidney disease: Secondary | ICD-10-CM | POA: Diagnosis not present

## 2019-09-29 DIAGNOSIS — F17210 Nicotine dependence, cigarettes, uncomplicated: Secondary | ICD-10-CM | POA: Diagnosis not present

## 2019-09-29 DIAGNOSIS — N183 Chronic kidney disease, stage 3 unspecified: Secondary | ICD-10-CM | POA: Diagnosis not present

## 2019-09-29 DIAGNOSIS — Z951 Presence of aortocoronary bypass graft: Secondary | ICD-10-CM | POA: Diagnosis not present

## 2019-09-29 DIAGNOSIS — K219 Gastro-esophageal reflux disease without esophagitis: Secondary | ICD-10-CM | POA: Diagnosis not present

## 2019-09-29 DIAGNOSIS — R7989 Other specified abnormal findings of blood chemistry: Secondary | ICD-10-CM

## 2019-09-29 DIAGNOSIS — C3411 Malignant neoplasm of upper lobe, right bronchus or lung: Secondary | ICD-10-CM | POA: Diagnosis not present

## 2019-09-29 DIAGNOSIS — I2510 Atherosclerotic heart disease of native coronary artery without angina pectoris: Secondary | ICD-10-CM | POA: Diagnosis not present

## 2019-09-29 DIAGNOSIS — I252 Old myocardial infarction: Secondary | ICD-10-CM | POA: Diagnosis not present

## 2019-09-30 ENCOUNTER — Telehealth: Payer: Self-pay | Admitting: *Deleted

## 2019-09-30 DIAGNOSIS — I2510 Atherosclerotic heart disease of native coronary artery without angina pectoris: Secondary | ICD-10-CM | POA: Diagnosis not present

## 2019-09-30 DIAGNOSIS — I129 Hypertensive chronic kidney disease with stage 1 through stage 4 chronic kidney disease, or unspecified chronic kidney disease: Secondary | ICD-10-CM | POA: Diagnosis not present

## 2019-09-30 DIAGNOSIS — I252 Old myocardial infarction: Secondary | ICD-10-CM | POA: Diagnosis not present

## 2019-09-30 DIAGNOSIS — Z951 Presence of aortocoronary bypass graft: Secondary | ICD-10-CM | POA: Diagnosis not present

## 2019-09-30 DIAGNOSIS — F17210 Nicotine dependence, cigarettes, uncomplicated: Secondary | ICD-10-CM | POA: Diagnosis not present

## 2019-09-30 DIAGNOSIS — K219 Gastro-esophageal reflux disease without esophagitis: Secondary | ICD-10-CM | POA: Diagnosis not present

## 2019-09-30 DIAGNOSIS — I249 Acute ischemic heart disease, unspecified: Secondary | ICD-10-CM | POA: Diagnosis not present

## 2019-09-30 DIAGNOSIS — C3411 Malignant neoplasm of upper lobe, right bronchus or lung: Secondary | ICD-10-CM | POA: Diagnosis not present

## 2019-09-30 DIAGNOSIS — N183 Chronic kidney disease, stage 3 unspecified: Secondary | ICD-10-CM | POA: Diagnosis not present

## 2019-09-30 NOTE — Telephone Encounter (Signed)
Can you check with daughter [name] about IV fluids/Hospice on Thursday, 10/01/19?

## 2019-09-30 NOTE — Telephone Encounter (Signed)
Received vm message from patient's daughter, Christopher Wilson, as well as a call from Gwinda [place], SW. Christopher Wilson is requesting a call back. TCT Christopher Wilson and spoke with her.  She states that pt is declining, she is concerned that he may be dehydrated. She states he has periods of confusion and periods of some clarity. He is very weak, cannot ambulate by himself, is not drinking more than 12 oz in 24 hours. He eats a little bit-he ate part of a peanut butter sandwich today only.  His urine is dark in color and has somewhat of an odor to it. Christopher Wilson states he is refusing to go to ED and she does not think she can get him here by herself. She doesn't think she can even get him in the car. Home health nurse is coming on Friday.  As her father refusing to come here and she is unable to even get him here-Teresa is asking if the home health can get a urine sample for u/x, c/s and some labs at home.  She would need orders and then she could do those .  Christopher Wilson doesn't know what else to do.  Please advise

## 2019-09-30 NOTE — Telephone Encounter (Signed)
Okay to have advanced home care and give him fluid 2 times a week or call hospice.

## 2019-10-01 ENCOUNTER — Telehealth: Payer: Self-pay

## 2019-10-01 ENCOUNTER — Other Ambulatory Visit: Payer: Self-pay

## 2019-10-01 NOTE — Telephone Encounter (Signed)
Christopher Wilson and his daughter, Christopher Wilson are requesting hospice services.  I have spoke to [place] from [facility].    I spoke to both Mr Wilson and his daughter Christopher Wilson.I have also spoken with Levada Dy from [facility], who has evaluated Christopher Wilson.   I asked him what his goals were.  Did he want to continue with active cancer treatment  Mr Wilson stated "I'm tired and want some energy".  I told Christopher Wilson that he needs to eat and drink adequate amounts to help himself with energy.  I explained briefly hospice services to both Christopher Wilson and Christopher Wilson.  I told them they would get more help in the home with hospice vs home health.  I told them that while on hospice Mr Wilson would not be able to receive active treatment for his cancer.  I did tell them that if he starts feeling better he can get off hospice and start active cancer treatment again.  Mr Wilson and Christopher Wilson both verbalized understanding and requested hospice services

## 2019-10-01 NOTE — Progress Notes (Unsigned)
referal

## 2019-10-01 NOTE — Telephone Encounter (Signed)
Ok to refer to hospice.

## 2019-10-02 ENCOUNTER — Telehealth: Payer: Self-pay

## 2019-10-02 DIAGNOSIS — I252 Old myocardial infarction: Secondary | ICD-10-CM | POA: Diagnosis not present

## 2019-10-02 DIAGNOSIS — N183 Chronic kidney disease, stage 3 unspecified: Secondary | ICD-10-CM | POA: Diagnosis not present

## 2019-10-02 DIAGNOSIS — I2510 Atherosclerotic heart disease of native coronary artery without angina pectoris: Secondary | ICD-10-CM | POA: Diagnosis not present

## 2019-10-02 DIAGNOSIS — F17210 Nicotine dependence, cigarettes, uncomplicated: Secondary | ICD-10-CM | POA: Diagnosis not present

## 2019-10-02 DIAGNOSIS — Z951 Presence of aortocoronary bypass graft: Secondary | ICD-10-CM | POA: Diagnosis not present

## 2019-10-02 DIAGNOSIS — K219 Gastro-esophageal reflux disease without esophagitis: Secondary | ICD-10-CM | POA: Diagnosis not present

## 2019-10-02 DIAGNOSIS — I249 Acute ischemic heart disease, unspecified: Secondary | ICD-10-CM | POA: Diagnosis not present

## 2019-10-02 DIAGNOSIS — I129 Hypertensive chronic kidney disease with stage 1 through stage 4 chronic kidney disease, or unspecified chronic kidney disease: Secondary | ICD-10-CM | POA: Diagnosis not present

## 2019-10-02 DIAGNOSIS — C3411 Malignant neoplasm of upper lobe, right bronchus or lung: Secondary | ICD-10-CM | POA: Diagnosis not present

## 2019-10-02 NOTE — Telephone Encounter (Signed)
Ms Christopher Wilson from [facility] called requesting a verbal order for Hospice care for Christopher Wilson.

## 2019-10-05 ENCOUNTER — Other Ambulatory Visit: Payer: Medicare HMO

## 2019-10-05 ENCOUNTER — Ambulatory Visit: Payer: Medicare HMO

## 2019-10-05 ENCOUNTER — Ambulatory Visit: Payer: Medicare HMO | Admitting: Physician Assistant

## 2019-10-05 ENCOUNTER — Telehealth: Payer: Self-pay

## 2019-10-05 NOTE — Telephone Encounter (Signed)
[name] called this am.  She asked if I would review hospice with D[place] again and to confirm that he does not have any appointments for treatment at this time.

## 2019-10-26 ENCOUNTER — Ambulatory Visit: Payer: Medicare HMO

## 2019-10-26 ENCOUNTER — Other Ambulatory Visit: Payer: Medicare HMO

## 2019-10-26 ENCOUNTER — Ambulatory Visit: Payer: Medicare HMO | Admitting: Internal Medicine

## 2019-10-30 DEATH — deceased
